# Patient Record
Sex: Female | Born: 1960 | Race: Black or African American | Hispanic: No | Marital: Married | Smoking: Former smoker
Health system: Southern US, Community
[De-identification: ages and names within clinical notes are randomized; demographics above are authoritative.]

## PROBLEM LIST (undated history)

## (undated) DIAGNOSIS — R748 Abnormal levels of other serum enzymes: Secondary | ICD-10-CM

## (undated) DIAGNOSIS — E785 Hyperlipidemia, unspecified: Secondary | ICD-10-CM

## (undated) DIAGNOSIS — I10 Essential (primary) hypertension: Secondary | ICD-10-CM

## (undated) DIAGNOSIS — J45909 Unspecified asthma, uncomplicated: Secondary | ICD-10-CM

## (undated) DIAGNOSIS — F32A Depression, unspecified: Secondary | ICD-10-CM

## (undated) DIAGNOSIS — F329 Major depressive disorder, single episode, unspecified: Secondary | ICD-10-CM

## (undated) DIAGNOSIS — F419 Anxiety disorder, unspecified: Secondary | ICD-10-CM

## (undated) DIAGNOSIS — R32 Unspecified urinary incontinence: Secondary | ICD-10-CM

## (undated) DIAGNOSIS — C801 Malignant (primary) neoplasm, unspecified: Secondary | ICD-10-CM

## (undated) DIAGNOSIS — E119 Type 2 diabetes mellitus without complications: Secondary | ICD-10-CM

## (undated) DIAGNOSIS — E669 Obesity, unspecified: Secondary | ICD-10-CM

## (undated) DIAGNOSIS — R51 Headache: Secondary | ICD-10-CM

## (undated) HISTORY — DX: Type 2 diabetes mellitus without complications: E11.9

## (undated) HISTORY — DX: Major depressive disorder, single episode, unspecified: F32.9

## (undated) HISTORY — DX: Depression, unspecified: F32.A

## (undated) HISTORY — DX: Headache: R51

## (undated) HISTORY — PX: ABDOMINAL HYSTERECTOMY: SHX81

## (undated) HISTORY — DX: Unspecified asthma, uncomplicated: J45.909

## (undated) HISTORY — PX: CRYOTHERAPY: SHX1416

## (undated) HISTORY — PX: PARTIAL HYSTERECTOMY: SHX80

## (undated) HISTORY — DX: Malignant (primary) neoplasm, unspecified: C80.1

## (undated) HISTORY — DX: Essential (primary) hypertension: I10

## (undated) HISTORY — DX: Anxiety disorder, unspecified: F41.9

## (undated) HISTORY — DX: Abnormal levels of other serum enzymes: R74.8

## (undated) HISTORY — DX: Hyperlipidemia, unspecified: E78.5

## (undated) HISTORY — PX: HEMORRHOID SURGERY: SHX153

## (undated) HISTORY — DX: Obesity, unspecified: E66.9

---
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
## 2001-06-15 ENCOUNTER — Other Ambulatory Visit: Admission: RE | Admit: 2001-06-15 | Discharge: 2001-06-15 | Payer: Self-pay | Admitting: Family Medicine

## 2001-08-13 ENCOUNTER — Encounter: Payer: Self-pay | Admitting: Internal Medicine

## 2001-08-13 ENCOUNTER — Emergency Department (HOSPITAL_COMMUNITY): Admission: EM | Admit: 2001-08-13 | Discharge: 2001-08-13 | Payer: Self-pay | Admitting: Internal Medicine

## 2001-08-21 ENCOUNTER — Encounter: Payer: Self-pay | Admitting: Family Medicine

## 2001-08-21 ENCOUNTER — Ambulatory Visit (HOSPITAL_COMMUNITY): Admission: RE | Admit: 2001-08-21 | Discharge: 2001-08-21 | Payer: Self-pay | Admitting: Family Medicine

## 2001-09-29 ENCOUNTER — Ambulatory Visit (HOSPITAL_COMMUNITY): Admission: RE | Admit: 2001-09-29 | Discharge: 2001-09-29 | Payer: Self-pay | Admitting: Orthopaedic Surgery

## 2001-09-29 ENCOUNTER — Encounter: Payer: Self-pay | Admitting: Orthopaedic Surgery

## 2002-03-15 ENCOUNTER — Emergency Department (HOSPITAL_COMMUNITY): Admission: EM | Admit: 2002-03-15 | Discharge: 2002-03-15 | Payer: Self-pay | Admitting: Emergency Medicine

## 2002-06-20 ENCOUNTER — Encounter: Payer: Self-pay | Admitting: Family Medicine

## 2002-06-20 ENCOUNTER — Ambulatory Visit (HOSPITAL_COMMUNITY): Admission: RE | Admit: 2002-06-20 | Discharge: 2002-06-20 | Payer: Self-pay | Admitting: Family Medicine

## 2004-06-21 ENCOUNTER — Emergency Department (HOSPITAL_COMMUNITY): Admission: EM | Admit: 2004-06-21 | Discharge: 2004-06-21 | Payer: Self-pay | Admitting: Emergency Medicine

## 2004-11-10 ENCOUNTER — Ambulatory Visit: Payer: Self-pay | Admitting: Family Medicine

## 2005-02-05 ENCOUNTER — Ambulatory Visit: Payer: Self-pay | Admitting: Family Medicine

## 2005-02-06 ENCOUNTER — Ambulatory Visit (HOSPITAL_COMMUNITY): Admission: RE | Admit: 2005-02-06 | Discharge: 2005-02-06 | Payer: Self-pay | Admitting: Family Medicine

## 2005-02-08 ENCOUNTER — Ambulatory Visit (HOSPITAL_COMMUNITY): Admission: RE | Admit: 2005-02-08 | Discharge: 2005-02-08 | Payer: Self-pay | Admitting: Family Medicine

## 2005-07-21 ENCOUNTER — Ambulatory Visit: Payer: Self-pay | Admitting: Family Medicine

## 2005-09-02 ENCOUNTER — Ambulatory Visit: Payer: Self-pay | Admitting: Family Medicine

## 2005-12-29 ENCOUNTER — Ambulatory Visit (HOSPITAL_COMMUNITY): Admission: RE | Admit: 2005-12-29 | Discharge: 2005-12-29 | Payer: Self-pay | Admitting: Family Medicine

## 2005-12-29 ENCOUNTER — Ambulatory Visit: Payer: Self-pay | Admitting: Family Medicine

## 2006-03-15 ENCOUNTER — Ambulatory Visit: Payer: Self-pay | Admitting: Family Medicine

## 2006-03-18 ENCOUNTER — Emergency Department (HOSPITAL_COMMUNITY): Admission: EM | Admit: 2006-03-18 | Discharge: 2006-03-18 | Payer: Self-pay | Admitting: Emergency Medicine

## 2006-03-21 ENCOUNTER — Ambulatory Visit: Payer: Self-pay | Admitting: Internal Medicine

## 2006-04-04 ENCOUNTER — Ambulatory Visit (HOSPITAL_COMMUNITY): Admission: RE | Admit: 2006-04-04 | Discharge: 2006-04-04 | Payer: Self-pay

## 2006-04-06 ENCOUNTER — Ambulatory Visit: Payer: Self-pay | Admitting: Family Medicine

## 2006-07-07 ENCOUNTER — Ambulatory Visit: Payer: Self-pay | Admitting: Family Medicine

## 2007-04-13 ENCOUNTER — Ambulatory Visit: Payer: Self-pay | Admitting: Family Medicine

## 2007-04-13 ENCOUNTER — Encounter: Payer: Self-pay | Admitting: Family Medicine

## 2007-04-13 LAB — CONVERTED CEMR LAB: Pap Smear: NORMAL

## 2007-04-14 ENCOUNTER — Encounter: Payer: Self-pay | Admitting: Family Medicine

## 2007-04-17 ENCOUNTER — Ambulatory Visit (HOSPITAL_COMMUNITY): Admission: RE | Admit: 2007-04-17 | Discharge: 2007-04-17 | Payer: Self-pay | Admitting: Family Medicine

## 2007-04-17 ENCOUNTER — Other Ambulatory Visit: Admission: RE | Admit: 2007-04-17 | Discharge: 2007-04-17 | Payer: Self-pay | Admitting: Family Medicine

## 2007-07-03 ENCOUNTER — Ambulatory Visit: Payer: Self-pay | Admitting: Family Medicine

## 2007-09-21 ENCOUNTER — Emergency Department (HOSPITAL_COMMUNITY): Admission: EM | Admit: 2007-09-21 | Discharge: 2007-09-21 | Payer: Self-pay | Admitting: Emergency Medicine

## 2007-09-25 ENCOUNTER — Ambulatory Visit: Payer: Self-pay | Admitting: Family Medicine

## 2007-11-30 ENCOUNTER — Encounter: Payer: Self-pay | Admitting: Family Medicine

## 2007-12-18 ENCOUNTER — Ambulatory Visit: Payer: Self-pay | Admitting: Family Medicine

## 2007-12-18 LAB — CONVERTED CEMR LAB
BUN: 7 mg/dL
Basophils Absolute: 0 K/uL
Basophils Relative: 0 %
CO2: 25 meq/L
Calcium: 9.4 mg/dL
Chloride: 101 meq/L
Cholesterol: 210 mg/dL — ABNORMAL HIGH
Creatinine, Ser: 0.82 mg/dL
Eosinophils Absolute: 0.1 K/uL
Eosinophils Relative: 2 %
Glucose, Bld: 106 mg/dL — ABNORMAL HIGH
HCT: 44.2 %
HDL: 61 mg/dL
Hemoglobin: 14.5 g/dL
LDL Cholesterol: 128 mg/dL — ABNORMAL HIGH
Lymphocytes Relative: 41 %
Lymphs Abs: 1.9 K/uL
MCHC: 32.8 g/dL
MCV: 85.2 fL
Monocytes Absolute: 0.5 K/uL
Monocytes Relative: 11 %
Neutro Abs: 2.1 K/uL
Neutrophils Relative %: 45 %
Platelets: 286 K/uL
Potassium: 3.8 meq/L
RBC: 5.19 M/uL — ABNORMAL HIGH
RDW: 13.6 %
Sodium: 141 meq/L
Total CHOL/HDL Ratio: 3.4
Triglycerides: 105 mg/dL
VLDL: 21 mg/dL
WBC: 4.7 10*3/microliter

## 2007-12-20 ENCOUNTER — Ambulatory Visit (HOSPITAL_COMMUNITY): Admission: RE | Admit: 2007-12-20 | Discharge: 2007-12-20 | Payer: Self-pay | Admitting: Family Medicine

## 2008-04-16 ENCOUNTER — Encounter: Payer: Self-pay | Admitting: Family Medicine

## 2008-04-16 DIAGNOSIS — F321 Major depressive disorder, single episode, moderate: Secondary | ICD-10-CM

## 2008-04-16 DIAGNOSIS — F4323 Adjustment disorder with mixed anxiety and depressed mood: Secondary | ICD-10-CM

## 2008-04-30 ENCOUNTER — Ambulatory Visit: Payer: Self-pay | Admitting: Family Medicine

## 2008-04-30 ENCOUNTER — Encounter: Payer: Self-pay | Admitting: Family Medicine

## 2008-09-23 ENCOUNTER — Encounter: Payer: Self-pay | Admitting: Family Medicine

## 2008-09-23 ENCOUNTER — Other Ambulatory Visit: Admission: RE | Admit: 2008-09-23 | Discharge: 2008-09-23 | Payer: Self-pay | Admitting: Family Medicine

## 2008-09-23 ENCOUNTER — Ambulatory Visit: Payer: Self-pay | Admitting: Family Medicine

## 2008-09-23 LAB — CONVERTED CEMR LAB: OCCULT 1: NEGATIVE

## 2008-09-25 ENCOUNTER — Ambulatory Visit (HOSPITAL_COMMUNITY): Admission: RE | Admit: 2008-09-25 | Discharge: 2008-09-25 | Payer: Self-pay | Admitting: Family Medicine

## 2008-10-02 ENCOUNTER — Encounter: Payer: Self-pay | Admitting: Family Medicine

## 2008-10-03 ENCOUNTER — Ambulatory Visit: Payer: Self-pay | Admitting: Family Medicine

## 2008-10-06 DIAGNOSIS — J45909 Unspecified asthma, uncomplicated: Secondary | ICD-10-CM

## 2008-11-04 ENCOUNTER — Ambulatory Visit: Payer: Self-pay | Admitting: Family Medicine

## 2008-11-29 HISTORY — PX: COLONOSCOPY WITH ESOPHAGOGASTRODUODENOSCOPY (EGD): SHX5779

## 2008-12-03 ENCOUNTER — Ambulatory Visit: Payer: Self-pay | Admitting: Family Medicine

## 2008-12-03 DIAGNOSIS — K644 Residual hemorrhoidal skin tags: Secondary | ICD-10-CM | POA: Insufficient documentation

## 2008-12-08 DIAGNOSIS — I10 Essential (primary) hypertension: Secondary | ICD-10-CM | POA: Insufficient documentation

## 2008-12-17 ENCOUNTER — Ambulatory Visit (HOSPITAL_COMMUNITY): Admission: RE | Admit: 2008-12-17 | Discharge: 2008-12-17 | Payer: Self-pay | Admitting: General Surgery

## 2008-12-17 ENCOUNTER — Encounter: Payer: Self-pay | Admitting: Family Medicine

## 2008-12-17 LAB — HM COLONOSCOPY: HM Colonoscopy: NORMAL

## 2008-12-18 ENCOUNTER — Encounter: Payer: Self-pay | Admitting: Family Medicine

## 2009-01-21 ENCOUNTER — Ambulatory Visit: Payer: Self-pay | Admitting: Family Medicine

## 2009-01-21 DIAGNOSIS — R1013 Epigastric pain: Secondary | ICD-10-CM

## 2009-01-21 DIAGNOSIS — K3189 Other diseases of stomach and duodenum: Secondary | ICD-10-CM

## 2009-01-27 ENCOUNTER — Ambulatory Visit (HOSPITAL_COMMUNITY): Admission: RE | Admit: 2009-01-27 | Discharge: 2009-01-27 | Payer: Self-pay | Admitting: Family Medicine

## 2009-01-30 ENCOUNTER — Encounter (HOSPITAL_COMMUNITY): Admission: RE | Admit: 2009-01-30 | Discharge: 2009-03-01 | Payer: Self-pay | Admitting: Family Medicine

## 2009-02-06 ENCOUNTER — Telehealth: Payer: Self-pay | Admitting: Family Medicine

## 2009-02-10 ENCOUNTER — Telehealth: Payer: Self-pay | Admitting: Family Medicine

## 2009-03-26 ENCOUNTER — Ambulatory Visit: Payer: Self-pay | Admitting: Family Medicine

## 2009-03-27 LAB — CONVERTED CEMR LAB
CO2: 24 meq/L (ref 19–32)
Calcium: 9.6 mg/dL (ref 8.4–10.5)
Creatinine, Ser: 1.04 mg/dL (ref 0.40–1.20)
Eosinophils Absolute: 0.1 10*3/uL (ref 0.0–0.7)
Eosinophils Relative: 1 % (ref 0–5)
HCT: 38.2 % (ref 36.0–46.0)
HDL: 46 mg/dL (ref >39)
Hemoglobin: 12.9 g/dL (ref 12.0–15.0)
Lymphocytes Relative: 21 % (ref 12–46)
Lymphs Abs: 2.4 10*3/uL (ref 0.7–4.0)
MCV: 81.4 fL (ref 78.0–100.0)
Monocytes Relative: 9 % (ref 3–12)
Neutro Abs: 7.8 10*3/uL — ABNORMAL HIGH (ref 1.7–7.7)
Neutrophils Relative %: 68 % (ref 43–77)
Potassium: 3.9 meq/L (ref 3.5–5.3)
Triglycerides: 113 mg/dL (ref <150)

## 2009-03-30 DIAGNOSIS — R519 Headache, unspecified: Secondary | ICD-10-CM | POA: Insufficient documentation

## 2009-03-30 DIAGNOSIS — R51 Headache: Secondary | ICD-10-CM | POA: Insufficient documentation

## 2009-04-08 ENCOUNTER — Encounter: Payer: Self-pay | Admitting: Family Medicine

## 2009-07-31 ENCOUNTER — Ambulatory Visit: Payer: Self-pay | Admitting: Family Medicine

## 2009-07-31 ENCOUNTER — Other Ambulatory Visit: Admission: RE | Admit: 2009-07-31 | Discharge: 2009-07-31 | Payer: Self-pay | Admitting: Family Medicine

## 2009-07-31 ENCOUNTER — Encounter: Payer: Self-pay | Admitting: Family Medicine

## 2009-08-26 ENCOUNTER — Ambulatory Visit (HOSPITAL_COMMUNITY): Payer: Self-pay | Admitting: Psychiatry

## 2009-09-03 ENCOUNTER — Ambulatory Visit (HOSPITAL_COMMUNITY): Payer: Self-pay | Admitting: Psychiatry

## 2009-09-15 ENCOUNTER — Telehealth: Payer: Self-pay | Admitting: Family Medicine

## 2009-09-17 ENCOUNTER — Ambulatory Visit: Payer: Self-pay | Admitting: Family Medicine

## 2009-10-01 ENCOUNTER — Emergency Department (HOSPITAL_COMMUNITY): Admission: EM | Admit: 2009-10-01 | Discharge: 2009-10-01 | Payer: Self-pay | Admitting: Emergency Medicine

## 2009-11-04 ENCOUNTER — Ambulatory Visit: Payer: Self-pay | Admitting: Family Medicine

## 2009-11-04 LAB — CONVERTED CEMR LAB
LDL Cholesterol: 146 mg/dL — ABNORMAL HIGH (ref 0–99)
VLDL: 26 mg/dL (ref 0–40)

## 2009-11-15 DIAGNOSIS — E669 Obesity, unspecified: Secondary | ICD-10-CM

## 2009-12-11 ENCOUNTER — Ambulatory Visit (HOSPITAL_COMMUNITY): Payer: Self-pay | Admitting: Psychiatry

## 2009-12-16 ENCOUNTER — Telehealth: Payer: Self-pay | Admitting: Family Medicine

## 2009-12-19 ENCOUNTER — Encounter: Payer: Self-pay | Admitting: Family Medicine

## 2010-01-08 ENCOUNTER — Ambulatory Visit: Payer: Self-pay | Admitting: Family Medicine

## 2010-01-14 ENCOUNTER — Encounter: Payer: Self-pay | Admitting: Physician Assistant

## 2010-01-14 ENCOUNTER — Ambulatory Visit: Payer: Self-pay | Admitting: Family Medicine

## 2010-01-14 DIAGNOSIS — J111 Influenza due to unidentified influenza virus with other respiratory manifestations: Secondary | ICD-10-CM

## 2010-01-20 ENCOUNTER — Ambulatory Visit (HOSPITAL_COMMUNITY): Payer: Self-pay | Admitting: Psychiatry

## 2010-01-22 ENCOUNTER — Ambulatory Visit (HOSPITAL_COMMUNITY): Admission: RE | Admit: 2010-01-22 | Discharge: 2010-01-22 | Payer: Self-pay | Admitting: Family Medicine

## 2010-02-27 ENCOUNTER — Ambulatory Visit: Payer: Self-pay | Admitting: Family Medicine

## 2010-02-27 DIAGNOSIS — R5383 Other fatigue: Secondary | ICD-10-CM

## 2010-02-27 DIAGNOSIS — R5381 Other malaise: Secondary | ICD-10-CM | POA: Insufficient documentation

## 2010-03-03 ENCOUNTER — Ambulatory Visit (HOSPITAL_COMMUNITY): Payer: Self-pay | Admitting: Psychiatry

## 2010-03-31 ENCOUNTER — Ambulatory Visit (HOSPITAL_COMMUNITY): Payer: Self-pay | Admitting: Psychiatry

## 2010-04-16 ENCOUNTER — Telehealth: Payer: Self-pay | Admitting: Family Medicine

## 2010-04-17 ENCOUNTER — Ambulatory Visit: Payer: Self-pay | Admitting: Family Medicine

## 2010-05-04 ENCOUNTER — Encounter: Payer: Self-pay | Admitting: Family Medicine

## 2010-05-04 ENCOUNTER — Telehealth: Payer: Self-pay | Admitting: Family Medicine

## 2010-06-16 ENCOUNTER — Ambulatory Visit: Payer: Self-pay | Admitting: Family Medicine

## 2010-09-22 ENCOUNTER — Telehealth: Payer: Self-pay | Admitting: Family Medicine

## 2010-09-22 ENCOUNTER — Ambulatory Visit: Payer: Self-pay | Admitting: Family Medicine

## 2010-09-22 DIAGNOSIS — H547 Unspecified visual loss: Secondary | ICD-10-CM | POA: Insufficient documentation

## 2010-09-28 ENCOUNTER — Encounter: Payer: Self-pay | Admitting: Family Medicine

## 2010-09-29 ENCOUNTER — Encounter: Payer: Self-pay | Admitting: Family Medicine

## 2010-09-29 DIAGNOSIS — E782 Mixed hyperlipidemia: Secondary | ICD-10-CM | POA: Insufficient documentation

## 2010-09-29 DIAGNOSIS — E781 Pure hyperglyceridemia: Secondary | ICD-10-CM | POA: Insufficient documentation

## 2010-09-29 DIAGNOSIS — E785 Hyperlipidemia, unspecified: Secondary | ICD-10-CM | POA: Insufficient documentation

## 2010-09-29 DIAGNOSIS — E559 Vitamin D deficiency, unspecified: Secondary | ICD-10-CM

## 2010-09-29 LAB — CONVERTED CEMR LAB
AST: 13 units/L (ref 0–37)
Albumin: 4.4 g/dL (ref 3.5–5.2)
Alkaline Phosphatase: 53 units/L (ref 39–117)
Basophils Absolute: 0 10*3/uL (ref 0.0–0.1)
Basophils Relative: 1 % (ref 0–1)
CO2: 29 meq/L (ref 19–32)
Calcium: 9.5 mg/dL (ref 8.4–10.5)
Cholesterol: 261 mg/dL — ABNORMAL HIGH (ref 0–200)
Creatinine, Ser: 0.87 mg/dL (ref 0.40–1.20)
Glucose, Bld: 93 mg/dL (ref 70–99)
HCT: 39.2 % (ref 36.0–46.0)
Hemoglobin: 12.8 g/dL (ref 12.0–15.0)
MCV: 84.8 fL (ref 78.0–100.0)
Neutro Abs: 3.5 10*3/uL (ref 1.7–7.7)
Potassium: 4.3 meq/L (ref 3.5–5.3)
RBC: 4.62 M/uL (ref 3.87–5.11)
RDW: 14 % (ref 11.5–15.5)
TSH: 1.159 microintl units/mL (ref 0.350–4.500)
Total Protein: 7.6 g/dL (ref 6.0–8.3)
Triglycerides: 156 mg/dL — ABNORMAL HIGH (ref <150)
WBC: 8.9 10*3/uL (ref 4.0–10.5)

## 2010-10-07 ENCOUNTER — Ambulatory Visit: Payer: Self-pay | Admitting: Family Medicine

## 2010-10-13 ENCOUNTER — Encounter: Payer: Self-pay | Admitting: Family Medicine

## 2010-11-17 ENCOUNTER — Other Ambulatory Visit
Admission: RE | Admit: 2010-11-17 | Discharge: 2010-11-17 | Payer: Self-pay | Source: Home / Self Care | Admitting: Family Medicine

## 2010-11-18 ENCOUNTER — Ambulatory Visit: Payer: Self-pay | Admitting: Family Medicine

## 2010-11-19 ENCOUNTER — Encounter: Payer: Self-pay | Admitting: Family Medicine

## 2010-12-20 ENCOUNTER — Encounter: Payer: Self-pay | Admitting: Internal Medicine

## 2010-12-20 ENCOUNTER — Encounter: Payer: Self-pay | Admitting: Family Medicine
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
## 2010-12-29 NOTE — Letter (Signed)
Summary: FMLA PAPER  FMLA PAPER   Imported By: Dierdre Harness 04/17/2010 10:16:31  _____________________________________________________________________  External Attachment:    Type:   Image     Comment:   External Document

## 2010-12-29 NOTE — Letter (Signed)
Summary: fmla papers  fmla papers   Imported By: Dierdre Harness 09/29/2010 14:55:58  _____________________________________________________________________  External Attachment:    Type:   Image     Comment:   External Document

## 2010-12-29 NOTE — Letter (Signed)
Summary: demo  demo   Imported By: Dierdre Harness 04/17/2010 14:46:23  _____________________________________________________________________  External Attachment:    Type:   Image     Comment:   External Document

## 2010-12-29 NOTE — Letter (Signed)
Summary: phone notes  phone notes   Imported By: Dierdre Harness 04/17/2010 14:48:48  _____________________________________________________________________  External Attachment:    Type:   Image     Comment:   External Document

## 2010-12-29 NOTE — Letter (Signed)
Summary: EYE EXAM  EYE EXAM   Imported By: Dierdre Harness 10/13/2010 09:37:51  _____________________________________________________________________  External Attachment:    Type:   Image     Comment:   External Document

## 2010-12-29 NOTE — Letter (Signed)
Summary: HANDICAPP CARD  HANDICAPP CARD   Imported By: Dierdre Harness 05/04/2010 15:59:41  _____________________________________________________________________  External Attachment:    Type:   Image     Comment:   External Document

## 2010-12-29 NOTE — Letter (Signed)
Summary: office notes  office notes   Imported By: Dierdre Harness 04/17/2010 14:45:20  _____________________________________________________________________  External Attachment:    Type:   Image     Comment:   External Document

## 2010-12-29 NOTE — Assessment & Plan Note (Signed)
Summary: F UP   Vital Signs:  Patient profile:   50 year old female Menstrual status:  hysterectomy Height:      62.5 inches Weight:      173.75 pounds BMI:     31.39 O2 Sat:      97 % on Room air Pulse rate:   106 / minute Pulse rhythm:   regular Resp:     16 per minute BP sitting:   122 / 90  (left arm)  Vitals Entered By: Louie Casa CMA (September 22, 2010 8:34 AM)  Nutrition Counseling: Patient's BMI is greater than 25 and therefore counseled on weight management options.  O2 Flow:  Room air CC: Follow up   Primary Care Provider:  Tula Nakayama MD  CC:  Follow up.  History of Present Illness: 2 year h/o intermittent loss of vision sometimes 5 mins and other times up to 45 mins, both eyes have been affected, most often the right eye only.most recentepisode was 1 week ago affecting both eyes lasting 45 mins. Last eye exam was over 2 yrs ago She has had intermittentweakness in both legs for the past year, she has been dragging her legs at times, and last week had to be lifted by herhusband. She denies recent fever or chills. She has chronic anxiety which is fairly well controlled at this time  Preventive Screening-Counseling & Management  Alcohol-Tobacco     Smoking Cessation Counseling: yes  Current Medications (verified): 1)  Alprazolam 1 Mg  Tbdp (Alprazolam) .... Take 1 Tablet By Mouth Two Times A Day 2)  Duoneb 0.5-2.5 (3) Mg/15m  Soln (Ipratropium-Albuterol) .... As Needed 3)  Symbicort 80-4.5 Mcg/act  Aero (Budesonide-Formoterol Fumarate) .... Inhale One Puff Daily 4)  Amlodipine Besylate 2.5 Mg Tabs (Amlodipine Besylate) .... One Tab By Mouth Once Daily. 5)  Proair Hfa 108 (90 Base) Mcg/act Aers (Albuterol Sulfate) .... 2 Puffs Every 6 To 8 Hrs As Needed  Allergies (verified): No Known Drug Allergies  Review of Systems General:  Complains of fatigue. Eyes:  Complains of vision loss-both eyes. ENT:  Denies earache, hoarseness, nasal congestion,  postnasal drainage, and sinus pressure. CV:  Denies chest pain or discomfort, palpitations, and swelling of feet. Resp:  Denies cough and sputum productive. GI:  Denies abdominal pain, constipation, diarrhea, nausea, and vomiting. GU:  Denies dysuria and urinary frequency. MS:  Complains of muscle weakness; denies joint pain, low back pain, mid back pain, and stiffness. Psych:  Complains of anxiety and mental problems; denies depression, suicidal thoughts/plans, thoughts of violence, and unusual visions or sounds. Endo:  Denies cold intolerance, excessive thirst, excessive urination, and heat intolerance. Heme:  Denies abnormal bruising and bleeding. Allergy:  Denies hives or rash and itching eyes.  Physical Exam  General:  Well-developed,well-nourished,in no acute distress; alert,appropriate and cooperative throughout examination HEENT: No facial asymmetry,  EOMI, No sinus tenderness, TM's Clear, oropharynx  pink and moist.   Chest: Clear to auscultation bilaterally.  CVS: S1, S2, No murmurs, No S3.   Abd: Soft, Nontender.  MS: Adequate ROM spine, hips, shoulders and knees.  Ext: No edema.   CNS: CN 2-12 intact, power tone and sensation normal throughout.   Skin: Intact, no visible lesions or rashes.  Psych: Good eye contact, normal affect.  Memory intact, not anxious or depressed appearing.    Impression & Recommendations:  Problem # 1:  WEAKNESS (ICD-780.79) Assessment Deteriorated  Orders: T-TSH (([id number] Neurology Referral (Neuro)  Problem # 2:  UNSPECIFIED VISUAL LOSS (  ICD-369.9) Assessment: Deteriorated  Orders: Ophthalmology Referral (Ophthalmology) Neurology Referral (Neuro)  Problem # 3:  OBESITY, UNSPECIFIED (ICD-278.00) Assessment: Improved  Ht: 62.5 (09/22/2010)   Wt: 173.75 (09/22/2010)   BMI: 31.39 (09/22/2010) therapeutic lifestyle change discussed and encouraged  Problem # 4:  HYPERTENSION (ICD-401.9) Assessment: Deteriorated  Her updated  medication list for this problem includes:    Amlodipine Besylate 2.5 Mg Tabs (Amlodipine besylate) ..... One tab by mouth once daily. Patient advised to follow low sodium diet rich in fruit and vegetables, and to commit to at least 30 minutes 5 days per week of regular exercise , to improve blood presure control.   BP today: 122/90 Prior BP: 120/84 (06/16/2010)  Labs Reviewed: K+: 3.9 (03/26/2009) Creat: : 1.04 (03/26/2009)   Chol: 229 (11/04/2009)   HDL: 57 (11/04/2009)   LDL: 146 (11/04/2009)   TG: 130 (11/04/2009)  Problem # 5:  VITAMIN D DEFICIENCY (ICD-268.9) Assessment: Comment Only p[t to start once weekly vitr D and rept level in 4 months , she should also start daily oTC ca with D  Problem # 6:  ANXIETY (ICD-300.00)  Her updated medication list for this problem includes:    Alprazolam 1 Mg Tbdp (Alprazolam) .Marland Kitchen... Take 1 tablet by mouth two times a day  Problem # 7:  HYPERLIPIDEMIA (ICD-272.4) Assessment: Comment Only  Her updated medication list for this problem includes:    Lipitor 20 Mg Tabs (Atorvastatin calcium) .Marland Kitchen... Take 1 tab by mouth at bedtime    HDL:57 (11/04/2009), 46 (03/26/2009)  LDL:146 (11/04/2009), 167 (03/26/2009)  Chol:229 (11/04/2009), 236 (03/26/2009)  Trig:130 (11/04/2009), 113 (03/26/2009) Low fat dietdiscussed and encouraged  Complete Medication List: 1)  Alprazolam 1 Mg Tbdp (Alprazolam) .... Take 1 tablet by mouth two times a day 2)  Duoneb 0.5-2.5 (3) Mg/37m Soln (Ipratropium-albuterol) .... As needed 3)  Symbicort 80-4.5 Mcg/act Aero (Budesonide-formoterol fumarate) .... Inhale one puff daily 4)  Amlodipine Besylate 2.5 Mg Tabs (Amlodipine besylate) .... One tab by mouth once daily. 5)  Proair Hfa 108 (90 Base) Mcg/act Aers (Albuterol sulfate) .... 2 puffs every 6 to 8 hrs as needed 6)  Vitamin D (ergocalciferol) 50000 Unit Caps (Ergocalciferol) .... One capsule once weekly 7)  Lipitor 20 Mg Tabs (Atorvastatin calcium) .... Take 1 tab by mouth  at bedtime  Other Orders: T-Basic Metabolic Panel ([id number] T-CBC w/Diff ([phone] T-Lipid Profile ([phone] T-Hepatic Function ([phone] Influenza Vaccine NON MCR ([phone] T-Vitamin D (25-Hydroxy) ([phone]  Patient Instructions: 1)  CPE in 3 weeks 2)  Tobacco is very bad for your health and your loved ones! You Should stop smoking!. 3)  Stop Smoking Tips: Choose a Quit date. Cut down before the Quit date. decide what you will do as a substitute when you feel the urge to smoke(gum,toothpick,exercise). 4)  It is important that you exercise regularly at least 20 minutes 5 times a week. If you develop chest pain, have severe difficulty breathing, or feel very tired , stop exercising immediately and seek medical attention. 5)  You need to lose weight. Consider a lower calorie diet and regular exercise.  6)  I am VERY concerned that you may have a neurologic disease, It isvital that you see the neurologist and also the opthalmologist, we will schedule appts. 7)  Fasting labs past due , pls get asap. 8)  Flu vaccine today Prescriptions: LIPITOR 20 MG TABS (ATORVASTATIN CALCIUM) Take 1 tab by mouth at bedtime  #30 x 3   Entered and Authorized by:   MTula Nakayama  MD   Signed by:   Tula Nakayama MD on 09/29/2010   Method used:   Historical   RxID:   [id number] VITAMIN D (ERGOCALCIFEROL) 50000 UNIT CAPS (ERGOCALCIFEROL) one capsule once weekly  #4 x 4   Entered and Authorized by:   Tula Nakayama MD   Signed by:   Tula Nakayama MD on 09/29/2010   Method used:   Historical   RxID:   [id number]    Orders Added: 1)  Est. Patient Level IV [71245] 2)  T-Basic Metabolic Panel [[id number]] 3)  T-CBC w/Diff [[id number]] 4)  T-Lipid Profile [80061-22930] 5)  T-Hepatic Function [93790-24097] 6)  T-TSH [35329-92426] 7)  Influenza Vaccine NON MCR [83419] 8)  T-Vitamin D (25-Hydroxy) [62229-79892] 9)  Ophthalmology Referral [Ophthalmology] 10)   Neurology Referral [Neuro]   Immunizations Administered:  Influenza Vaccine:    Vaccine Type: Fluvax Non-MCR    Site: right deltoid    Mfr: novartis    Dose: 0.5 ml    Route: IM    Given by: Louie Casa CMA    Exp. Date: 03/2011    Lot #: [id number]    VIS given: 06/23/10 version given September 22, 2010.   Immunizations Administered:  Influenza Vaccine:    Vaccine Type: Fluvax Non-MCR    Site: right deltoid    Mfr: novartis    Dose: 0.5 ml    Route: IM    Given by: Louie Casa CMA    Exp. Date: 03/2011    Lot #: [id number]    VIS given: 06/23/10 version given September 22, 2010.

## 2010-12-29 NOTE — Letter (Signed)
Summary: consults  consults   Imported By: Dierdre Harness 04/17/2010 14:45:51  _____________________________________________________________________  External Attachment:    Type:   Image     Comment:   External Document

## 2010-12-29 NOTE — Assessment & Plan Note (Signed)
Summary: F UP   Vital Signs:  Patient profile:   50 year old female Menstrual status:  hysterectomy Height:      62.5 inches Weight:      181.75 pounds BMI:     32.83 O2 Sat:      98 % Pulse rate:   91 / minute Resp:     16 per minute BP sitting:   120 / 84  (left arm) Cuff size:   large  Vitals Entered By: Kate Sable LPN (June 16, 4192 7:90 AM) CC: Follow up chronic problems   Primary Care Provider:  Tula Nakayama MD  CC:  Follow up chronic problems.  History of Present Illness: pt has had several close losses this year, an aunt ,an uncle , and recent she had exrtraction of 4 teeth also, so she does report inc in her stress and anxietyu, however she is dealing wilth this fairly well. she has stopped the antipsychotic meds as well as seeing the psychiatrist , and so far she is doing well.  Denies recent fever or chills. Denies sinus pressure, nasal congestion , ear pain or sore throat. Denies chest congestion, or cough productive of sputum. Denies chest pain, palpitations, PND, orthopnea or leg swelling. Denies abdominal pain, nausea, vomitting, diarrhea or constipation. Denies change in bowel movements or bloody stool. Denies dysuria , frequency, incontinence or hesitancy. Denies  joint pain, swelling, or reduced mobility. Denies headaches, vertigo, seizures.  Denies  rash, lesions, or itch.     Current Medications (verified): 1)  Alprazolam 1 Mg  Tbdp (Alprazolam) .... Take 1 Tablet By Mouth Two Times A Day 2)  Duoneb 0.5-2.5 (3) Mg/22m  Soln (Ipratropium-Albuterol) .... As Needed 3)  Symbicort 80-4.5 Mcg/act  Aero (Budesonide-Formoterol Fumarate) .... Inhale One Puff Daily 4)  Amlodipine Besylate 2.5 Mg Tabs (Amlodipine Besylate) .... One Tab By Mouth Qd 5)  Phentermine Hcl 37.5 Mg Tabs (Phentermine Hcl) .... Take 1 Tablet By Mouth Once A Day 6)  Proair Hfa 108 (90 Base) Mcg/act Aers (Albuterol Sulfate) .... 2 Puffs Every 6 To 8 Hrs As Needed  Allergies  (verified): No Known Drug Allergies  Review of Systems      See HPI General:  Complains of fatigue and sleep disorder; denies chills and fever. Eyes:  Denies blurring, discharge, eye pain, and red eye. Psych:  Complains of anxiety, depression, and mental problems; denies suicidal thoughts/plans, thoughts of violence, and unusual visions or sounds. Endo:  Denies cold intolerance, excessive hunger, excessive thirst, excessive urination, heat intolerance, polyuria, and weight change. Heme:  Denies abnormal bruising and bleeding. Allergy:  Complains of seasonal allergies; denies hives or rash and itching eyes.  Physical Exam  General:  Well-developed,well-nourished,in no acute distress; alert,appropriate and cooperative throughout examination HEENT: No facial asymmetry,  EOMI, No sinus tenderness, TM's Clear, oropharynx  pink and moist.   Chest: Clear to auscultation bilaterally.  CVS: S1, S2, No murmurs, No S3.   Abd: Soft, Nontender.  MS: Adequate ROM spine, hips, shoulders and knees.  Ext: No edema.   CNS: CN 2-12 intact, power tone and sensation normal throughout.   Skin: Intact, no visible lesions or rashes.  Psych: Good eye contact, normal affect.  Memory intact, not anxious or depressed appearing.    Impression & Recommendations:  Problem # 1:  OBESITY, UNSPECIFIED (ICD-278.00) Assessment Improved  Ht: 62.5 (06/16/2010)   Wt: 181.75 (06/16/2010)   BMI: 32.83 (06/16/2010)  Problem # 2:  HYPERTENSION (ICD-401.9) Assessment: Unchanged  Her updated  medication list for this problem includes:    Amlodipine Besylate 2.5 Mg Tabs (Amlodipine besylate) ..... One tab by mouth qd  BP today: 120/84 Prior BP: 120/80 (02/27/2010)  Labs Reviewed: K+: 3.9 (03/26/2009) Creat: : 1.04 (03/26/2009)   Chol: 229 (11/04/2009)   HDL: 57 (11/04/2009)   LDL: 146 (11/04/2009)   TG: 130 (11/04/2009)  Problem # 3:  ASTHMA, UNSPECIFIED, UNSPECIFIED STATUS (ICD-493.90) Assessment: Improved  Her  updated medication list for this problem includes:    Duoneb 0.5-2.5 (3) Mg/31m Soln (Ipratropium-albuterol) ..Marland Kitchen.. As needed    Symbicort 80-4.5 Mcg/act Aero (Budesonide-formoterol fumarate) ..... Inhale one puff daily    Proair Hfa 108 (90 Base) Mcg/act Aers (Albuterol sulfate) ..Marland Kitchen.. 2 puffs every 6 to 8 hrs as needed  Problem # 4:  DEPRESSION (ICD-311) Assessment: Improved  Her updated medication list for this problem includes:    Alprazolam 1 Mg Tbdp (Alprazolam) ..Marland Kitchen.. Take 1 tablet by mouth two times a day  Discussed treatment options, including trial of antidpressant medication. Will refer to behavioral health. Follow-up call in in 24-48 hours and recheck in 2 weeks, sooner as needed. Patient agrees to call if any worsening of symptoms or thoughts of doing harm arise. Verified that the patient has no suicidal ideation at this time.  Pt has decided to rely on her faith as far as her mentral illness is concerned. Currently she is stable. Shee has also discontinued psychh appts as well as psych meds  Problem # 5:  EXTERNAL HEMORRHOIDS (ICD-455.3) Assessment: Improved  Problem # 6:  ANXIETY (ICD-300.00) Assessment: Improved  Her updated medication list for this problem includes:    Alprazolam 1 Mg Tbdp (Alprazolam) ..Marland Kitchen.. Take 1 tablet by mouth two times a day  Complete Medication List: 1)  Alprazolam 1 Mg Tbdp (Alprazolam) .... Take 1 tablet by mouth two times a day 2)  Duoneb 0.5-2.5 (3) Mg/367mSoln (Ipratropium-albuterol) .... As needed 3)  Symbicort 80-4.5 Mcg/act Aero (Budesonide-formoterol fumarate) .... Inhale one puff daily 4)  Amlodipine Besylate 2.5 Mg Tabs (Amlodipine besylate) .... One tab by mouth qd 5)  Phentermine Hcl 37.5 Mg Tabs (Phentermine hcl) .... Take 1 tablet by mouth once a day 6)  Proair Hfa 108 (90 Base) Mcg/act Aers (Albuterol sulfate) .... 2 puffs every 6 to 8 hrs as needed 7)  Ibuprofen 800 Mg Tabs (Ibuprofen) .... Take 1 tablet by mouth three times a day as  needed  Patient Instructions: 1)  Please schedule a cPE in 3 months. 2)  It is important that you exercise regularly at least 20 minutes 5 times a week. If you develop chest pain, have severe difficulty breathing, or feel very tired , stop exercising immediately and seek medical attention. 3)  You need to lose weight. Consider a lower calorie diet and regular exercise.  4)  labs past due pls do asap Prescriptions: ALPRAZOLAM 1 MG  TBDP (ALPRAZOLAM) Take 1 tablet by mouth two times a day  #60 x 3   Entered by:   BrKate SablePN   Authorized by:   MaTula NakayamaD   Signed by:   BrKate SablePN on 0700/93/8182 Method used:   Printed then faxed to ...       [facility]       [address]     [place]  [address]     Ph: [phone]  Fax: [phone]   RxID:   [id number] AMLODIPINE BESYLATE 2.5 MG TABS (AMLODIPINE BESYLATE) ONE TAB by mouth QD  #30 x 5   Entered by:   Kate Sable LPN   Authorized by:   Tula Nakayama MD   Signed by:   Kate Sable LPN on 75/83/0746   Method used:   Printed then faxed to ...       [facility] (retail)       [address]       [place], [place]  [address]       Ph: [phone]       Fax: [phone]   RxID:   [id number] PHENTERMINE HCL 37.5 MG TABS (PHENTERMINE HCL) Take 1 tablet by mouth once a day  #30 x 2   Entered by:   Kate Sable LPN   Authorized by:   Tula Nakayama MD   Signed by:   Kate Sable LPN on 07/35/4301   Method used:   Printed then faxed to ...       [facility]  [address] ([facility]ail[facility]       [address]       [place], [place]  [address]       Ph: [phone]       Fax: [phone]   RxID:   [id number] IBUPROFEN 800 MG TABS (IBUPROFEN) Take 1 tablet by mouth three times a day as needed  #30 x 0   Entered and Authorized by:   Tula Nakayama MD   Signed by:   Tula Nakayama MD on 06/16/2010    Method used:   Electronically to        [facility] (retail)       [address]       [place], [place]  [address]       Ph: [phone]       Fax: [phone]   RxID:   [id number]

## 2010-12-29 NOTE — Progress Notes (Signed)
  Phone Note Other Incoming   Caller: dr simpson Summary of Call: pls fill in fmla form from mopst recent, and i will review and sign Initial call taken by: Tula Nakayama MD,  December 16, 2009 12:46 PM  Follow-up for Phone Call        completed as much of form as i could and gave to doc to review Follow-up by: Jimmey Ralph LPN,  December 18, 9978 9:37 AM

## 2010-12-29 NOTE — Letter (Signed)
Summary: x rays  x rays   Imported By: Dierdre Harness 04/17/2010 14:49:13  _____________________________________________________________________  External Attachment:    Type:   Image     Comment:   External Document

## 2010-12-29 NOTE — Letter (Signed)
Summary: misc  misc   Imported By: Dierdre Harness 04/17/2010 14:48:11  _____________________________________________________________________  External Attachment:    Type:   Image     Comment:   External Document

## 2010-12-29 NOTE — Letter (Signed)
Summary: fmla papers  fmla papers   Imported By: Dierdre Harness 12/19/2009 09:34:50  _____________________________________________________________________  External Attachment:    Type:   Image     Comment:   External Document

## 2010-12-29 NOTE — Progress Notes (Signed)
Summary: MEDS  Phone Note Call from Patient   Summary of Call: NEEDS ALL MEDS SENT TO [facility] CALL AND LET HER KNOW WHEN CALLED IN AND DONE @ [phone] Initial call taken by: Dierdre Harness,  September 22, 2010 9:48 AM    Prescriptions: ALPRAZOLAM 1 MG  TBDP (ALPRAZOLAM) Take 1 tablet by mouth two times a day  #60 x 3   Entered by:   Baldomero Lamy LPN   Authorized by:   Tula Nakayama MD   Signed by:   Baldomero Lamy LPN on 19/50/9326   Method used:   Printed then faxed to ...       [facility]  [address] (retail)       [address]       [place], [place]  [address]       Ph: [phone]       Fax: [phone]   RxID:   [id number] [place] 0.5-2.5 (3) MG/3ML  SOLN (IPRATROPIUM-ALBUTEROL) as needed  #1 box x 3   Entered by:   Baldomero Lamy LPN   Authorized by:   Tula Nakayama MD   Signed by:   Baldomero Lamy LPN on 24/26/8341   Method used:   Electronically to        [facility] (retail)       [address]       [place], [place]  [address]       Ph: [phone]       Fax: [phone]   RxID:   [id number] AMLODIPINE BESYLATE 2.5 MG TABS (AMLODIPINE BESYLATE) ONE TAB by mouth once daily.  #30 x 3   Entered by:   Baldomero Lamy LPN   Authorized by:   Tula Nakayama MD   Signed by:   Baldomero Lamy LPN on 85/12/7739   Method used:   Electronically to        [facility] [address] (retail)       [address]       [place], [place]  [address]       Ph: [phone]       Fax: [phone]   RxID:   [id number] [place] 80-4.5 MCG/ACT  AERO (BUDESONIDE-FORMOTEROL FUMARATE) Inhale one puff daily  #2 x 3   Entered by:   Baldomero Lamy LPN   Authorized by:   Tula Nakayama MD   Signed by:   Baldomero Lamy LPN on 51/70/0174   Method used:   Electronically to        [facility] (retail)       [address]       [place], [place]  [address]       Ph:  [phone]       Fax: [phone]   RxID:   [id number] PROAIR HFA 108 (90 BASE) MCG/ACT AERS (ALBUTEROL SULFATE) 2 puffs every 6 to 8 hrs as needed  #1 x 3   Entered by:   Baldomero Lamy LPN   Authorized by:   Tula Nakayama MD   Signed by:   Baldomero Lamy LPN on 26/33/3545   Method used:   Electronically to        [facility] (retail)       [place], [place]  [address]       Ph: [phone]       Fax: [phone]   RxID:   [id number]
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
## 2010-12-29 NOTE — Progress Notes (Signed)
Summary: FMLA PAPERS  Phone Note Call from Patient   Summary of Call: IS HER FMLA PAPERS READY SHE NEEDS THEM  CALL BACK AT [phone]  CW  [phone] DUE THE 15TH Initial call taken by: Dierdre Harness,  Apr 16, 2010 9:25 AM  Follow-up for Phone Call        i think I asked you to fill in these papers pls check, if you don't havethem ask randi, i do not have them Follow-up by: Tula Nakayama MD,  Apr 16, 2010 12:11 PM  Additional Follow-up for Phone Call Additional follow up Details #1::        this has been completed and on your bookcase Additional Follow-up by: Baldomero Lamy LPN,  Apr 16, 8285 7:51 PM    Additional Follow-up for Phone Call Additional follow up Details #2::    thanks, pls notuify pt that the form is ready Follow-up by: Tula Nakayama MD,  Apr 16, 2010 4:58 PM  Additional Follow-up for Phone Call Additional follow up Details #3:: Details for Additional Follow-up Action Taken: patient aware Additional Follow-up by: Dierdre Harness,  Apr 17, 2010 8:16 AM

## 2010-12-29 NOTE — Letter (Signed)
Summary: Out of Work  [facility]  [address]   [place], [place] [address]   Phone: [phone]  Fax: [phone]    January 14, 2010   Employee:  KATHEY SIMER    To Whom It May Concern:   For Medical reasons, please excuse the above named employee from work for the following dates:  Start:   01/13/10  End:   01/16/10 may return to work without restriction.  If you need additional information, please feel free to contact our office.         Sincerely,    Kennith Gain PA

## 2010-12-29 NOTE — Progress Notes (Signed)
Summary: HANDICAPP CARD  Phone Note Call from Patient   Summary of Call: NEEDS A HANDICAPP CARD TO [place] CLOSER TO [place] TO [place] AT [phone] Initial call taken by: Dierdre Harness,  May 04, 2010 8:47 AM  Follow-up for Phone Call        pls fill in handicap form , she has asthma, I will sign, when we have completed it let her know/leave in "out Box, it will be scanned in Follow-up by: Tula Nakayama MD,  May 04, 2010 12:39 PM  Additional Follow-up for Phone Call Additional follow up Details #1::        completed and put up front for scanning and pick up Additional Follow-up by: Baldomero Lamy LPN,  May 05, 2711 9:29 PM

## 2010-12-29 NOTE — Assessment & Plan Note (Signed)
Summary: FMLA PAPERS   Allergies: No Known Drug Allergies   Complete Medication List: 1)  Alprazolam 1 Mg Tbdp (Alprazolam) .... Take 1 tablet by mouth two times a day 2)  Duoneb 0.5-2.5 (3) Mg/12m Soln (Ipratropium-albuterol) .... As needed 3)  Symbicort 80-4.5 Mcg/act Aero (Budesonide-formoterol fumarate) .... Inhale one puff daily 4)  Amlodipine Besylate 2.5 Mg Tabs (Amlodipine besylate) .... One tab by mouth qd 5)  Phentermine Hcl 37.5 Mg Tabs (Phentermine hcl) .... Take 1 tablet by mouth once a day 6)  Proctofoam Hc 1-1 % Foam (Hydrocortisone ace-pramoxine) .... Apply twice dails as needed to hemmorhoids 7)  Proair Hfa 108 (90 Base) Mcg/act Aers (Albuterol sulfate) .... 2 puffs every 6 to 8 hrs as needed 8)  Promethazine-codeine 6.25-10 Mg/518mSyrp (Promethazine-codeine) ...Marland Kitchen 1-2 tsp q 6 hrs as needed for cough 9)  Phentermine Hcl 37.5 Mg Tabs (Phentermine hcl) .... Take 1 tablet by mouth once a day 10)  Depakote Er 500 Mg Xr24h-tab (Divalproex sodium) .... 2 tabs at bedtime  Other Orders: Form Completion ([id number]

## 2010-12-29 NOTE — Letter (Signed)
Summary: labs  labs   Imported By: Dierdre Harness 04/17/2010 14:47:35  _____________________________________________________________________  External Attachment:    Type:   Image     Comment:   External Document

## 2010-12-29 NOTE — Assessment & Plan Note (Signed)
Summary: office visit   Vital Signs:  Patient profile:   50 year old female Menstrual status:  hysterectomy Height:      62.5 inches Weight:      186 pounds BMI:     33.60 O2 Sat:      94 % Pulse rate:   93 / minute Pulse rhythm:   regular Resp:     16 per minute BP sitting:   112 / 80  Vitals Entered By: Kate Sable (January 08, 2010 8:26 AM)  Nutrition Counseling: Patient's BMI is greater than 25 and therefore counseled on weight management options. CC: Hemmorhoids are bothering her, and she wants a refill on the phentermine, has lost 14lbs!   Primary Care Provider:  Tula Nakayama MD  CC:  Hemmorhoids are bothering her, and she wants a refill on the phentermine, and has lost 14lbs!.  History of Present Illness: Reports  that she has been doing well. She commited to dietary change and exercise and has lost 7 punds per month for the[past 2 months since starting phentermine. She is also on valproic acid for mood stabilisation from psychiatry who she has started seeing. She reports her hemmorhoids have been bothering her in the past 2 weeks  Denies recent fever or chills. Denies sinus pressure, nasal congestion , ear pain or sore throat. Denies chest congestion, or cough productive of sputum. Denies chest pain, palpitations, PND, orthopnea or leg swelling. Denies abdominal pain, nausea, vomitting, diarrhea or constipation. Denies change in bowel movements or bloody stool. Denies dysuria , frequency, incontinence or hesitancy. Denies  joint pain, swelling, or reduced mobility. Denies headaches, vertigo, seizures.      Current Medications (verified): 1)  Alprazolam 1 Mg  Tbdp (Alprazolam) .... Take 1 Tablet By Mouth Two Times A Day 2)  Proair Hfa 108 (90 Base) Mcg/act  Aers (Albuterol Sulfate) .... Inhale Two Puffs Every Six Hours As Needed 3)  Duoneb 0.5-2.5 (3) Mg/25m  Soln (Ipratropium-Albuterol) .... As Needed 4)  Symbicort 80-4.5 Mcg/act  Aero  (Budesonide-Formoterol Fumarate) .... Inhale One Puff Daily 5)  Amlodipine Besylate 2.5 Mg Tabs (Amlodipine Besylate) .... One Tab By Mouth Qd 6)  Topamax 50 Mg Tabs (Topiramate) .... Take 1 Tablet By Mouth Two Times A Day 7)  Phentermine Hcl 37.5 Mg Tabs (Phentermine Hcl) .... Take 1 Tablet By Mouth Once A Day 8)  Divalproex Sodium 250 Mg Xr24h-Tab (Divalproex Sodium) ..Marland Kitchen. 1 Tab At Bedtime For 1 Week Then 2 Tabs At Bedtime At At Bedtime  Allergies (verified): No Known Drug Allergies  Review of Systems      See HPI Eyes:  Denies blurring and discharge. Heme:  Denies abnormal bruising and bleeding. Allergy:  Denies hives or rash and sneezing.  Physical Exam  General:  Well-developed,obese,in no acute distress; alert,appropriate and cooperative throughout examination HEENT: No facial asymmetry,  EOMI,no sinus tenderness, TM's Clear, oropharynx  pink and moist.   Chest: clear to ascultation throughout CVS: S1, S2, No murmurs, No S3.   Abd: Soft, Nontender.  MS: Adequate ROM spine, hips, shoulders and knees.  Ext: No edema.   CNS: CN 2-12 intact, power tone and sensation normal throughout.   Skin: Intact, no visible lesions or rashes.  Psych: Good eye contact, normal affect.  Memory intact, not anxious or depressed appearing.    Impression & Recommendations:  Problem # 1:  OBESITY, UNSPECIFIED (ICD-278.00) Assessment Improved  Ht: 62.5 (01/08/2010)   Wt: 186 (01/08/2010)   BMI: 33.60 (01/08/2010)  Problem #  2:  HYPERTENSION (ICD-401.9) Assessment: Improved  Her updated medication list for this problem includes:    Amlodipine Besylate 2.5 Mg Tabs (Amlodipine besylate) ..... One tab by mouth qd  BP today: 112/80 Prior BP: 122/90 (11/04/2009)  Labs Reviewed: K+: 3.9 (03/26/2009) Creat: : 1.04 (03/26/2009)   Chol: 229 (11/04/2009)   HDL: 57 (11/04/2009)   LDL: 146 (11/04/2009)   TG: 130 (11/04/2009)  Problem # 3:  EXTERNAL HEMORRHOIDS (ICD-455.3) Assessment:  Deteriorated proctofoam prescribed  Problem # 4:  ASTHMA, UNSPECIFIED, UNSPECIFIED STATUS (ICD-493.90) Assessment: Unchanged  The following medications were removed from the medication list:    Proair Hfa 108 (90 Base) Mcg/act Aers (Albuterol sulfate) ..... Inhale two puffs every six hours as needed Her updated medication list for this problem includes:    Duoneb 0.5-2.5 (3) Mg/20m Soln (Ipratropium-albuterol) ..Marland Kitchen.. As needed    Symbicort 80-4.5 Mcg/act Aero (Budesonide-formoterol fumarate) ..... Inhale one puff daily    Proair Hfa 108 (90 Base) Mcg/act Aers (Albuterol sulfate) ..Marland Kitchen.. 2 puffs every 6 to 8 hrs as needed  Complete Medication List: 1)  Alprazolam 1 Mg Tbdp (Alprazolam) .... Take 1 tablet by mouth two times a day 2)  Duoneb 0.5-2.5 (3) Mg/329mSoln (Ipratropium-albuterol) .... As needed 3)  Symbicort 80-4.5 Mcg/act Aero (Budesonide-formoterol fumarate) .... Inhale one puff daily 4)  Amlodipine Besylate 2.5 Mg Tabs (Amlodipine besylate) .... One tab by mouth qd 5)  Phentermine Hcl 37.5 Mg Tabs (Phentermine hcl) .... Take 1 tablet by mouth once a day 6)  Divalproex Sodium 250 Mg Xr24h-tab (Divalproex sodium) ...Marland Kitchen 1 tab at bedtime for 1 week then 2 tabs at bedtime at at bedtime 7)  Proctofoam Hc 1-1 % Foam (Hydrocortisone ace-pramoxine) .... Apply twice dails as needed to hemmorhoids 8)  Proair Hfa 108 (90 Base) Mcg/act Aers (Albuterol sulfate) .... 2 puffs every 6 to 8 hrs as needed  Other Orders: Radiology Referral (Radiology)  Patient Instructions: 1)  Please schedule a follow-up appointment in 2 months. 2)  It is important that you exercise regularly at least 60 minutes 5 times a week. If you develop chest pain, have severe difficulty breathing, or feel very tired , stop exercising immediately and seek medical attention. 3)  You need to lose weight. Consider a lower calorie diet and regular exercise. CONGRATS on weight loss, keep it up. 4)  I am very happy that you are going  to mental health Prescriptions: DUONEB 0.5-2.5 (3) MG/3ML  SOLN (IPRATROPIUM-ALBUTEROL) as needed  #1 box x 3   Entered by:   BrKate Sable Authorized by:   MaTula NakayamaD   Signed by:   BrKate Sablen 01/08/2010   Method used:   [facility]       [place]  [id number]     Ph: [phone]     Fax: [phone] RxID: :   [id number] HCL 37.5 MG TABS (PHENTERMINE HCL) Take 1 tablet by mouth once a day  #30 x 0   Entered by:   BrKate Sable Authorized by:   MaTula NakayamaD   Signed by:   BrKate Sablen 01/08/2010   Method used:   Printed then faxed to ...       [facility]  [address]  [place], [place]  [address]       Ph: [phone]       Fax: [phone]   RxID:   [id number] ALPRAZOLAM 1 MG  TBDP (ALPRAZOLAM) Take 1 tablet by mouth two times a day  #60 x 3   Entered by:   Kate Sable   Authorized by:   Tula Nakayama MD   Signed by:   Kate Sable on 01/08/2010   Method used:   Printed then faxed to ...       [facility]  [address] ([facility]       [address]       [place], [place]  [address]       Ph: [phone]       Fax: [phone]   RxID:   [id number] PROAIR HFA 108 (90 BASE) MCG/ACT AERS (ALBUTEROL SULFATE) 2 puffs every 6 to 8 hrs as needed  #1 x 3   Entered and Authorized by:   Tula Nakayama MD   Signed by:   Tula Nakayama MD on 01/08/2010   Method used:   Electronically to        [facility] (retail)       [address]       [place], [place]  [address]       Ph: [phone]       Fax: [phone]   RxID:   [id number] PROCTOFOAM HC 1-1 % FOAM (HYDROCORTISONE ACE-PRAMOXINE) apply twice dails as needed to hemmorhoids  #30cc x 3   Entered and Authorized by:   Tula Nakayama MD   Signed by:   Tula Nakayama  MD on 01/08/2010   Method used:   Electronically to        [facility] (retail)       [address]       [place], [place]  [address]       Ph: [phone]       Fax: [phone]   RxID:   [id number]

## 2010-12-29 NOTE — Assessment & Plan Note (Signed)
Summary: fmla paper   Allergies: No Known Drug Allergies   Complete Medication List: 1)  Alprazolam 1 Mg Tbdp (Alprazolam) .... Take 1 tablet by mouth two times a day 2)  Duoneb 0.5-2.5 (3) Mg/94m Soln (Ipratropium-albuterol) .... As needed 3)  Symbicort 80-4.5 Mcg/act Aero (Budesonide-formoterol fumarate) .... Inhale one puff daily 4)  Amlodipine Besylate 2.5 Mg Tabs (Amlodipine besylate) .... One tab by mouth once daily. 5)  Proair Hfa 108 (90 Base) Mcg/act Aers (Albuterol sulfate) .... 2 puffs every 6 to 8 hrs as needed 6)  Vitamin D (ergocalciferol) 50000 Unit Caps (Ergocalciferol) .... One capsule once weekly 7)  Lipitor 20 Mg Tabs (Atorvastatin calcium) .... Take 1 tab by mouth at bedtime  Other Orders: Form Completion (([id number]   Orders Added: 1)  Form Completion [[[id number]]

## 2010-12-29 NOTE — Letter (Signed)
Summary: history and physical  history and physical   Imported By: Dierdre Harness 04/17/2010 14:47:06  _____________________________________________________________________  External Attachment:    Type:   Image     Comment:   External Document

## 2010-12-31 NOTE — Assessment & Plan Note (Signed)
Summary: PHY PER DR   Vital Signs:  Patient profile:   50 year old female Menstrual status:  hysterectomy Height:      62.5 inches Weight:      177.75 pounds O2 Sat:      98 % on Room air Pulse rate:   83 / minute Pulse rhythm:   regular Resp:     16 per minute BP sitting:   104 / 78  (left arm)  Vitals Entered By: Baldomero Lamy LPN (November 17, 8340 9:22 AM)  O2 Flow:  Room air CC: physical Is Patient Diabetic? No Comments did not bring meds, states list is correct but states she stopped cholesterol med due to advertisment she saw   Primary Care Provider:  Tula Nakayama MD  CC:  physical.  History of Present Illness: c/o inability to walk and vision disturbance, she is awaiting evalyuation by neurologist, a referral has been put through and she is to address a balance   uncotrolled allergies with post nasal derainage  Denies recent fever or chills. Denies sinus pressure, nasal congestion , ear pain or sore throat. Denies chest congestion, or cough productive of sputum. Denies chest pain, palpitations, PND, orthopnea or leg swelling. Denies abdominal pain, nausea, vomitting, diarrhea or constipation. Denies change in bowel movements or bloody stool. Denies dysuria , frequency, incontinence or hesitancy. Denies  joint pain, swelling, or reduced mobility. Denies depression, anxiety or insomnia. Denies  rash, lesions, or itch.     Allergies: No Known Drug Allergies  Review of Systems      See HPI General:  Complains of fatigue. Eyes:  Complains of vision loss-both eyes. Derm:  Denies itching, lesion(s), and rash. Neuro:  Complains of difficulty with concentration, headaches, poor balance, visual disturbances, and weakness; denies numbness, seizures, and sensation of room spinning. Psych:  Complains of anxiety and mental problems; denies sense of great danger, suicidal thoughts/plans, thoughts of violence, and unusual visions or sounds. Endo:  Denies cold  intolerance, excessive hunger, excessive thirst, and excessive urination. Heme:  Denies abnormal bruising and bleeding. Allergy:  Complains of seasonal allergies; denies hives or rash and itching eyes; increased symptoms x 1 week.  Physical Exam  General:  Well-developed,well-nourished,in no acute distress; alert,appropriate and cooperative throughout examination Head:  Normocephalic and atraumatic without obvious abnormalities. No apparent alopecia or balding. Eyes:  No corneal or conjunctival inflammation noted. EOMI. Perrla. Funduscopic exam benign, without hemorrhages, exudates or papilledema. Vision grossly normal. Ears:  External ear exam shows no significant lesions or deformities.  Otoscopic examination reveals clear canals, tympanic membranes are intact bilaterally without bulging, retraction, inflammation or discharge. Hearing is grossly normal bilaterally. Nose:  External nasal examination shows no deformity or inflammation. Nasal mucosa are pink and moist without lesions or exudates. Mouth:  Oral mucosa and oropharynx without lesions or exudates.  Teeth in good repair. Neck:  No deformities, masses, or tenderness noted. Chest Wall:  No deformities, masses, or tenderness noted. Breasts:  No mass, nodules, thickening, tenderness, bulging, retraction, inflamation, nipple discharge or skin changes noted.   Lungs:  Normal respiratory effort, chest expands symmetrically. Lungs are clear to auscultation, no crackles or wheezes. Heart:  Normal rate and regular rhythm. S1 and S2 normal without gallop, murmur, click, rub or other extra sounds. Abdomen:  Bowel sounds positive,abdomen soft and non-tender without masses, organomegaly or hernias noted. Rectal:  No external abnormalities noted. Normal sphincter tone. swollen internal hemmorhoids Genitalia:  normal introitus, mucosa pink and moist, and no adnexal masses  or tenderness. Uterus is absent  Msk:  No deformity or scoliosis noted of  thoracic or lumbar spine.   Pulses:  R and L carotid,radial,femoral,dorsalis pedis and posterior tibial pulses are full and equal bilaterally Extremities:  No clubbing, cyanosis, edema, or deformity noted with normal full range of motion of all joints.   Neurologic:  No cranial nerve deficits noted. Station and gait are normal. Plantar reflexes are down-going bilaterally. DTRs are symmetrical throughout. Sensory, motor and coordinative functions appear intact. Skin:  Intact without suspicious lesions or rashes Cervical Nodes:  No lymphadenopathy noted Axillary Nodes:  No palpable lymphadenopathy Inguinal Nodes:  No significant adenopathy Psych:  Cognition and judgment appear intact. Alert and cooperative with normal attention span and concentration. No apparent delusions, illusions, hallucinations   Impression & Recommendations:  Problem # 1:  HYPERLIPIDEMIA (ICD-272.4) Assessment Deteriorated  Her updated medication list for this problem includes:    Lipitor 20 Mg Tabs (Atorvastatin calcium) .Marland Kitchen... Take 1 tab by mouth at bedtime med compliance stressed,as well as a low fat diet Labs Reviewed: SGOT: 13 (09/28/2010)   SGPT: 11 (09/28/2010)   HDL:56 (09/28/2010), 57 (11/04/2009)  LDL:174 (09/28/2010), 146 (11/04/2009)  Chol:261 (09/28/2010), 229 (11/04/2009)  Trig:156 (09/28/2010), 130 (11/04/2009)  Problem # 2:  OBESITY, UNSPECIFIED (ICD-278.00) Assessment: Deteriorated  Ht: 62.5 (11/17/2010)   Wt: 177.75 (11/17/2010)   BMI: 31.39 (09/22/2010) pt to start phentermine, she reports inability to exercise due to lower ext weakness  Problem # 3:  EXTERNAL HEMORRHOIDS (ICD-455.3) Assessment: Unchanged high fiber diet with stool softeners encouraged  Problem # 4:  ASTHMA, UNSPECIFIED, UNSPECIFIED STATUS (ICD-493.90) Assessment: Unchanged  Her updated medication list for this problem includes:    Duoneb 0.5-2.5 (3) Mg/11m Soln (Ipratropium-albuterol) ..Marland Kitchen.. As needed    Symbicort 80-4.5  Mcg/act Aero (Budesonide-formoterol fumarate) ..... Inhale one puff daily    Proair Hfa 108 (90 Base) Mcg/act Aers (Albuterol sulfate) ..Marland Kitchen.. 2 puffs every 6 to 8 hrs as needed  Problem # 5:  HYPERTENSION (ICD-401.9) Assessment: Unchanged  Her updated medication list for this problem includes:    Amlodipine Besylate 2.5 Mg Tabs (Amlodipine besylate) ..... One tab by mouth once daily.  BP today: 104/78 Prior BP: 122/90 (09/22/2010)  Labs Reviewed: K+: 4.3 (09/28/2010) Creat: : 0.87 (09/28/2010)   Chol: 261 (09/28/2010)   HDL: 56 (09/28/2010)   LDL: 174 (09/28/2010)   TG: 156 (09/28/2010)  Problem # 6:  ANXIETY (ICD-300.00) Assessment: Unchanged  Her updated medication list for this problem includes:    Alprazolam 1 Mg Tbdp (Alprazolam) ..Marland Kitchen.. Take 1 tablet by mouth two times a day  Problem # 7:  DEPRESSION (ICD-311) Assessment: Improved  Her updated medication list for this problem includes:    Alprazolam 1 Mg Tbdp (Alprazolam) ..Marland Kitchen.. Take 1 tablet by mouth two times a day  Complete Medication List: 1)  Alprazolam 1 Mg Tbdp (Alprazolam) .... Take 1 tablet by mouth two times a day 2)  Duoneb 0.5-2.5 (3) Mg/358mSoln (Ipratropium-albuterol) .... As needed 3)  Symbicort 80-4.5 Mcg/act Aero (Budesonide-formoterol fumarate) .... Inhale one puff daily 4)  Amlodipine Besylate 2.5 Mg Tabs (Amlodipine besylate) .... One tab by mouth once daily. 5)  Proair Hfa 108 (90 Base) Mcg/act Aers (Albuterol sulfate) .... 2 puffs every 6 to 8 hrs as needed 6)  Vitamin D (ergocalciferol) 50000 Unit Caps (Ergocalciferol) .... One capsule once weekly 7)  Lipitor 20 Mg Tabs (Atorvastatin calcium) .... Take 1 tab by mouth at bedtime 8)  Flonase 50 Mcg/act Susp (  Fluticasone propionate) .... One to two puffs per nostril daily 9)  Phentermine Hcl 37.5 Mg Tabs (Phentermine hcl) .... Take 1 tablet by mouth once a day  Other Orders: Hemoccult Guaiac-1 spec.(in office) (82270) Pap Smear (88677)  Patient  Instructions: 1)  Please schedule a follow-up appointment in 2 months. 2)  It is important that you exercise regularly at least 20 minutes 5 times a week. If you develop chest pain, have severe difficulty breathing, or feel very tired , stop exercising immediately and seek medical attention. 3)  You need to lose weight. Consider a lower calorie diet and regular exercise.  4)  PLS take HALF the phentermine once daily not a whole. 5)  Med is sent in for allergy and pls take the lipitor as prescribed. 6)  Hope ypou feel better soon Prescriptions: PHENTERMINE HCL 37.5 MG TABS (PHENTERMINE HCL) Take 1 tablet by mouth once a day  #30 x 0   Entered and Authorized by:   Tula Nakayama MD   Signed by:   Tula Nakayama MD on 11/17/2010   Method used:   Printed then faxed to ...       [facility] (retail)       [address]       [place], [place]  [address]       Ph: [phone]       Fax: [phone]   RxID:   [id number] Asencion Islam 50 MCG/ACT SUSP (FLUTICASONE PROPIONATE) one to two puffs per nostril daily  #1 x 3   Entered and Authorized by:   Tula Nakayama MD   Signed by:   Tula Nakayama MD on 11/17/2010   Method used:   Electronically to        [facility] (retail)       [address]       [place], [place]  [address]       Ph: [phone]       Fax: [phone]   RxID:   [id number]    Orders Added: 1)  Est. Patient 40-64 years [28979] 2)  Hemoccult Guaiac-1 spec.(in office) [15041] 3)  Pap Smear [[id number]]    Laboratory Results  Date/Time Received: November 17, 2010 10:49 AM  Date/Time Reported: November 17, 2010 10:49 AM   Stool - Occult Blood Hemmoccult #1: negative Date: 11/17/2010 Comments: [id number] 13L 10/13 118 10/12 [facility] LPN  November 18, 9687 10:50 AM

## 2010-12-31 NOTE — Letter (Signed)
Summary: Pap Smear, Normal Letter, [facility]  [address]   [place], [place] [address]   Phone: [phone]  Fax: [phone]          November 19, 2010    Dear: Leslie Lyons    I am pleased to notify you that your PAP smear was normal.  You will need your next PAP smear in:     ____ 3 Months    ____ 6 Months    ____ 12 Months    Please call the office at our office number above, to schedule your next appointment.    Sincerely,     [facility]

## 2011-01-22 ENCOUNTER — Ambulatory Visit: Payer: Self-pay | Admitting: Family Medicine

## 2011-01-29 ENCOUNTER — Ambulatory Visit (INDEPENDENT_AMBULATORY_CARE_PROVIDER_SITE_OTHER): Payer: PRIVATE HEALTH INSURANCE | Admitting: Family Medicine

## 2011-01-29 ENCOUNTER — Encounter: Payer: Self-pay | Admitting: Family Medicine

## 2011-01-29 ENCOUNTER — Other Ambulatory Visit: Payer: Self-pay | Admitting: Family Medicine

## 2011-01-29 DIAGNOSIS — Z139 Encounter for screening, unspecified: Secondary | ICD-10-CM

## 2011-01-29 DIAGNOSIS — F411 Generalized anxiety disorder: Secondary | ICD-10-CM

## 2011-01-29 DIAGNOSIS — J45909 Unspecified asthma, uncomplicated: Secondary | ICD-10-CM

## 2011-01-29 DIAGNOSIS — E669 Obesity, unspecified: Secondary | ICD-10-CM

## 2011-01-29 DIAGNOSIS — E785 Hyperlipidemia, unspecified: Secondary | ICD-10-CM

## 2011-02-02 ENCOUNTER — Ambulatory Visit (HOSPITAL_COMMUNITY): Admission: RE | Admit: 2011-02-02 | Payer: 59 | Source: Ambulatory Visit

## 2011-02-04 NOTE — Assessment & Plan Note (Signed)
Summary: F UP   Vital Signs:  Patient profile:   50 year old female Menstrual status:  hysterectomy Height:      62.5 inches Weight:      184.25 pounds BMI:     33.28 O2 Sat:      96 % on Room air Pulse rate:   94 / minute Pulse rhythm:   regular Resp:     16 per minute BP sitting:   120 / 80  (right arm)  Vitals Entered By: Baldomero Lamy LPN (January 29, 8675 1:95 AM)  Nutrition Counseling: Patient's BMI is greater than 25 and therefore counseled on weight management options.  O2 Flow:  Room air CC: follow-up visit Is Patient Diabetic? No   Primary Care Provider:  Tula Nakayama MD  CC:  follow-up visit.  History of Present Illness: Reports  that she is not doing well. She reports increased and uncontrolled anxiety an depression. She staes that cymbalta actually helped, but was expensive, she however wants to get her weight down before styarting the medication. She is neither suicidal nor homicidal. Denies recent fever or chills. Denies sinus pressure, nasal congestion , ear pain or sore throat. Denies chest congestion, or cough productive of sputum. Denies chest pain, palpitations, PND, orthopnea or leg swelling. Denies abdominal pain, nausea, vomitting, diarrhea or constipation. Denies change in bowel movements or bloody stool. Denies dysuria , frequency, incontinence or hesitancy. Denies  joint pain, swelling, or reduced mobility. Denies headaches, vertigo, seizures.  Denies  rash, lesions, or itch.     Current Medications (verified): 1)  Alprazolam 1 Mg  Tbdp (Alprazolam) .... Take 1 Tablet By Mouth Two Times A Day 2)  Duoneb 0.5-2.5 (3) Mg/33m  Soln (Ipratropium-Albuterol) .... As Needed 3)  Symbicort 80-4.5 Mcg/act  Aero (Budesonide-Formoterol Fumarate) .... Inhale One Puff Daily 4)  Amlodipine Besylate 2.5 Mg Tabs (Amlodipine Besylate) .... One Tab By Mouth Once Daily. 5)  Proair Hfa 108 (90 Base) Mcg/act Aers (Albuterol Sulfate) .... 2 Puffs Every 6 To 8  Hrs As Needed 6)  Vitamin D (Ergocalciferol) 50000 Unit Caps (Ergocalciferol) .... One Capsule Once Weekly 7)  Lipitor 20 Mg Tabs (Atorvastatin Calcium) .... Take 1 Tab By Mouth At Bedtime 8)  Flonase 50 Mcg/act Susp (Fluticasone Propionate) .... One To Two Puffs Per Nostril Daily 9)  Phentermine Hcl 37.5 Mg Tabs (Phentermine Hcl) .... Take 1 Tablet By Mouth Once A Day  Allergies (verified): No Known Drug Allergies  Review of Systems      See HPI General:  Complains of fatigue. Eyes:  Denies discharge and red eye. Psych:  Complains of anxiety, depression, irritability, and mental problems; denies suicidal thoughts/plans, thoughts of violence, and unusual visions or sounds. Endo:  Denies cold intolerance, excessive hunger, excessive thirst, excessive urination, and heat intolerance. Heme:  Denies abnormal bruising and bleeding. Allergy:  Complains of seasonal allergies.  Physical Exam  General:  Well-developed,well-nourished,in no acute distress; alert,appropriate and cooperative throughout examination HEENT: No facial asymmetry,  EOMI, No sinus tenderness, TM's Clear, oropharynx  pink and moist.   Chest: Clear to auscultation bilaterally.  CVS: S1, S2, No murmurs, No S3.   Abd: Soft, Nontender.  MS: Adequate ROM spine, hips, shoulders and knees.  Ext: No edema.   CNS: CN 2-12 intact, power tone and sensation normal throughout.   Skin: Intact, no visible lesions or rashes.  Psych: Good eye contact, normal affect.  Memory intact, not anxious or depressed appearing.    Impression & Recommendations:  Problem # 1:  HYPERLIPIDEMIA (ICD-272.4) Assessment Comment Only  Her updated medication list for this problem includes:    Lipitor 20 Mg Tabs (Atorvastatin calcium) .Marland Kitchen... Take 1 tab by mouth at bedtime pt to start the medication, states she "was afraid of s/e" Orders: T-Lipid Profile [id number]) T-Hepatic Function [phone])  Labs Reviewed: SGOT: 13 (09/28/2010)   SGPT:  11 (09/28/2010)   HDL:56 (09/28/2010), 57 (11/04/2009)  LDL:174 (09/28/2010), 146 (11/04/2009)  Chol:261 (09/28/2010), 229 (11/04/2009)  Trig:156 (09/28/2010), 130 (11/04/2009)  Problem # 2:  OBESITY, UNSPECIFIED (ICD-278.00) Assessment: Deteriorated  Ht: 62.5 (01/29/2011)   Wt: 184.25 (01/29/2011)   BMI: 33.28 (01/29/2011) therapeutic lifestyle change discussed and encouraged Pt to stat phentermine 0ne daily x 74monthsupply written  Problem # 3:  HYPERTENSION (ICD-401.9) Assessment: Unchanged  Her updated medication list for this problem includes:    Amlodipine Besylate 2.5 Mg Tabs (Amlodipine besylate) ..... One tab by mouth once daily.  Orders: T-Basic Metabolic Panel ([id number]  BP today: 120/80 Prior BP: 104/78 (11/17/2010)  Labs Reviewed: K+: 4.3 (09/28/2010) Creat: : 0.87 (09/28/2010)   Chol: 261 (09/28/2010)   HDL: 56 (09/28/2010)   LDL: 174 (09/28/2010)   TG: 156 (09/28/2010)  Problem # 4:  ANXIETY (ICD-300.00) Assessment: Deteriorated  Her updated medication list for this problem includes:    Alprazolam 1 Mg Tbdp (Alprazolam) ..Marland Kitchen.. Take 1 tablet by mouth two times a day  Discussed medication use and relaxation techniques.  no dose increase,addictive property discussed  Complete Medication List: 1)  Alprazolam 1 Mg Tbdp (Alprazolam) .... Take 1 tablet by mouth two times a day 2)  Duoneb 0.5-2.5 (3) Mg/324mSoln (Ipratropium-albuterol) .... As needed 3)  Symbicort 80-4.5 Mcg/act Aero (Budesonide-formoterol fumarate) .... Inhale one puff daily 4)  Amlodipine Besylate 2.5 Mg Tabs (Amlodipine besylate) .... One tab by mouth once daily. 5)  Proair Hfa 108 (90 Base) Mcg/act Aers (Albuterol sulfate) .... 2 puffs every 6 to 8 hrs as needed 6)  Vitamin D (ergocalciferol) 50000 Unit Caps (Ergocalciferol) .... One capsule once weekly 7)  Lipitor 20 Mg Tabs (Atorvastatin calcium) .... Take 1 tab by mouth at bedtime 8)  Flonase 50 Mcg/act Susp (Fluticasone propionate) ....  One to two puffs per nostril daily 9)  Phentermine Hcl 37.5 Mg Tabs (Phentermine hcl) .... Take 1 tablet by mouth once a day  Other Orders: T-Vitamin D (25-Hydroxy) ([id number] Referral (Radiology)  Patient Instructions: 1)  Please schedule a follow-up appointment in 2 months. 2)  It is important that you exercise regularly at least 60 minutes 7 times a week. If you develop chest pain, have severe difficulty breathing, or feel very tired , stop exercising immediately and seek medical attention. 3)  You need to lose weight. Consider a lower calorie diet and regular exercise.  4)  You will start phentermine once daily, we will provide a 1200 cal diet sheet. 5)  BMP prior to visit, ICD-9: 6)  Hepatic Panel prior to visit, ICD-9:  fasting in 2 months 7)  Lipid Panel prior to visit, ICD-9:, vitamin d 8)  PLS start the lipitor today Prescriptions: ALPRAZOLAM 1 MG  TBDP (ALPRAZOLAM) Take 1 tablet by mouth two times a day  #60 x 3   Entered by:   JaBaldomero LamyPN   Authorized by:   MaTula NakayamaD   Signed by:   JaBaldomero LamyPN on 0373/53/2992 Method used:   Printed then faxed to ...       [facility]  [address] ([facility]ail)       [address]       [place], [place]  [address]       Ph: [phone]       Fax: [phone]   RxID:   [id number] VITAMIN D (ERGOCALCIFEROL) 50000 UNIT CAPS (ERGOCALCIFEROL) one capsule once weekly  #4 x 3   Entered by:   Baldomero Lamy LPN   Authorized by:   Tula Nakayama MD   Signed by:   Baldomero Lamy LPN on 86/76/7209   Method used:   Electronically to        [facility] (retail)       [address]       [place], [place]  [address]       Ph: [phone]       Fax: [phone]   RxID:   [id number] AMLODIPINE BESYLATE 2.5 MG TABS (AMLODIPINE BESYLATE) ONE TAB by mouth once daily.  #30 x 3   Entered by:   Baldomero Lamy LPN   Authorized by:   Tula Nakayama MD   Signed by:    Baldomero Lamy LPN on 96/75/9163   Method used:   Electronically to        [facility] (retail)       [address]       [place], [place]  [address]       Ph: [phone]       Fax: [phone]   RxID:   [id number] PHENTERMINE HCL 37.5 MG TABS (PHENTERMINE HCL) Take 1 tablet by mouth once a day  #30 x 1   Entered and Authorized by:   Tula Nakayama MD   Signed by:   Tula Nakayama MD on 01/29/2011   Method used:   Printed then faxed to ...       [facility]  [address] (retail)       [address]       [place], [place]  [address]       Ph: [phone]       Fax: [phone]   RxID:   [id number]    Orders Added: 1)  Est. Patient Level IV [12248] 2)  T-Basic Metabolic Panel [[id number]] 3)  T-Lipid Profile [[id number]] 4)  T-Hepatic Function [[id number]] 5)  T-Vitamin D (25-Hydroxy) [[id number]] 8)  Radiology Referral [Radiology]

## 2011-02-16 ENCOUNTER — Ambulatory Visit (HOSPITAL_COMMUNITY)
Admission: RE | Admit: 2011-02-16 | Discharge: 2011-02-16 | Disposition: A | Discharge status: Home / Self Care | Payer: 59 | Source: Ambulatory Visit | Attending: Family Medicine | Admitting: Family Medicine

## 2011-02-16 DIAGNOSIS — Z139 Encounter for screening, unspecified: Secondary | ICD-10-CM

## 2011-02-16 DIAGNOSIS — Z1231 Encounter for screening mammogram for malignant neoplasm of breast: Secondary | ICD-10-CM | POA: Insufficient documentation

## 2011-03-26 ENCOUNTER — Encounter: Payer: Self-pay | Admitting: Family Medicine

## 2011-03-31 ENCOUNTER — Encounter: Payer: Self-pay | Admitting: Family Medicine

## 2011-04-02 ENCOUNTER — Ambulatory Visit: Payer: PRIVATE HEALTH INSURANCE | Admitting: Family Medicine

## 2011-04-13 NOTE — H&P (Signed)
Leslie Lyons, TYNDALL             ACCOUNT NO.:  [id number]   MEDICAL RECORD NO.:  [id number]          PATIENT TYPE:  AMB   LOCATION:  DAY                           FACILITY:  [facility]   PHYSICIAN:  Jamesetta So, M.D.  DATE OF BIRTH:  Feb 06, 1961   DATE OF ADMISSION:  DATE OF DISCHARGE:  LH                              HISTORY & PHYSICAL   CHIEF COMPLAINT:  Hematochezia, GERD.   HISTORY OF PRESENT ILLNESS:  The patient is a 50 year old black female  who was referred for endoscopic evaluation.  She needs a colonoscopy for  hematochezia, and EGD for GERD and epigastric pain.  She has been  passing some blood in her stools for many months.  No weight loss,  nausea, vomiting, diarrhea, constipation, melena have been noted.  She  has never had a colonoscopy.  There is no family history of colon  carcinoma.   PAST MEDICAL HISTORY:  Extrinsic allergies/asthma, anxiety, and  hypertension.   SURGICAL HISTORY:  Cervical cancer, hysterectomy.   CURRENT MEDICATIONS:  Alprazolam, albuterol, Symbyax, DuoNeb, Symbicort,  and Topamax.   ALLERGIES:  No known drug allergies.   REVIEW OF SYSTEMS:  Noncontributory.   PHYSICAL EXAMINATION:  GENERAL:  The patient is a well-developed, well-  nourished black female in no acute distress.  LUNGS:  Clear to auscultation with equal breath sounds bilaterally.  HEART:  Regular rate and rhythm without S3, S4, or murmurs.  ABDOMEN:  Soft, nontender, and nondistended.  No hepatosplenomegaly or  masses are noted.  RECTAL:  Deferred to the procedure.   IMPRESSION:  Hematochezia, gastroesophageal reflux disease, and  epigastric pain.   PLAN:  The patient was scheduled for an EGD and colonoscopy on December 17, 2008.  The risks and benefits of the procedure including bleeding  and perforation were fully explained to the patient, gave informed  consent.      Jamesetta So, M.D.  Electronically Signed     MAJ/MEDQ  D:  12/10/2008  T:  12/11/2008   Job:  [id number]   cc:   Norwood Levo. Moshe Cipro, M.D.  Fax: [phone]

## 2011-05-18 ENCOUNTER — Ambulatory Visit: Payer: Self-pay | Admitting: Family Medicine

## 2011-06-07 ENCOUNTER — Other Ambulatory Visit: Payer: Self-pay | Admitting: Family Medicine

## 2011-06-22 ENCOUNTER — Other Ambulatory Visit: Payer: Self-pay | Admitting: Family Medicine

## 2011-06-23 ENCOUNTER — Encounter: Payer: Self-pay | Admitting: Family Medicine

## 2011-06-25 ENCOUNTER — Ambulatory Visit (INDEPENDENT_AMBULATORY_CARE_PROVIDER_SITE_OTHER): Payer: 59 | Admitting: Family Medicine

## 2011-06-25 ENCOUNTER — Encounter: Payer: Self-pay | Admitting: Family Medicine

## 2011-06-25 VITALS — BP 120/90 | HR 95 | Resp 16 | Ht 63.0 in | Wt 188.4 lb

## 2011-06-25 DIAGNOSIS — G8929 Other chronic pain: Secondary | ICD-10-CM

## 2011-06-25 DIAGNOSIS — E785 Hyperlipidemia, unspecified: Secondary | ICD-10-CM

## 2011-06-25 DIAGNOSIS — I10 Essential (primary) hypertension: Secondary | ICD-10-CM

## 2011-06-25 DIAGNOSIS — J45909 Unspecified asthma, uncomplicated: Secondary | ICD-10-CM

## 2011-06-25 DIAGNOSIS — F411 Generalized anxiety disorder: Secondary | ICD-10-CM

## 2011-06-25 DIAGNOSIS — R5383 Other fatigue: Secondary | ICD-10-CM

## 2011-06-25 DIAGNOSIS — F329 Major depressive disorder, single episode, unspecified: Secondary | ICD-10-CM

## 2011-06-25 DIAGNOSIS — M549 Dorsalgia, unspecified: Secondary | ICD-10-CM

## 2011-06-25 DIAGNOSIS — R5381 Other malaise: Secondary | ICD-10-CM

## 2011-06-25 DIAGNOSIS — R7301 Impaired fasting glucose: Secondary | ICD-10-CM

## 2011-06-25 MED ORDER — ALPRAZOLAM 1 MG PO TABS: 1.0000 mg | ORAL_TABLET | Freq: Two times a day (BID) | ORAL | Status: DC | PRN

## 2011-06-25 MED ORDER — ATORVASTATIN CALCIUM 20 MG PO TABS: 20.0000 mg | ORAL_TABLET | Freq: Every day | ORAL | Status: DC

## 2011-06-25 MED ORDER — KETOROLAC TROMETHAMINE 60 MG/2ML IM SOLN
60.0000 mg | Freq: Once | INTRAMUSCULAR | Status: AC
  Administered 2011-06-25: 60 mg via INTRAMUSCULAR

## 2011-06-25 MED ORDER — VENLAFAXINE HCL ER 37.5 MG PO CP24: 37.5000 mg | ORAL_CAPSULE | Freq: Every day | ORAL | Status: DC

## 2011-06-25 NOTE — Patient Instructions (Addendum)
F/u in 2 months.  pls follow a diet rich in fruit and vegetable water and white meat.  You need to start doing daily exercise, and you will be started on velafaxine for depression and stress. pls go to the hospitsal if you feel out of control.you are being referred back to mental health you need to see psychiatry  Fasting lipid,hepatic, chem 7 , tSH hBA1C asap

## 2011-06-26 ENCOUNTER — Other Ambulatory Visit: Payer: Self-pay | Admitting: Family Medicine

## 2011-06-26 DIAGNOSIS — G8929 Other chronic pain: Secondary | ICD-10-CM | POA: Insufficient documentation

## 2011-06-26 NOTE — Assessment & Plan Note (Signed)
Pt to resume med for depression. Pt to re establish with psychiatry urgently, and agrees to do so She is currently not suicidal or homicidal, however reports that at times she feels pushed on her job. States if needed she will go to the hospital if she feels "out of control"

## 2011-06-26 NOTE — Progress Notes (Signed)
  Subjective:    Patient ID: Leslie Lyons, female    DOB: 1961/08/22, 50 y.o.   MRN: [id number]  HPI The PT is here for follow up and re-evaluation of chronic medical conditions, medication management and review of any available recent lab and radiology data.  Preventive health is updated, specifically  Cancer screening and Immunization.   Questions or concerns regarding consultations or procedures which the PT has had in the interim are  addressed. The PT denies any adverse reactions to current medications since the last visit.  She c/o increased mid and low back pain x 1 week, no specific related trauma that brought this on, it is non radiating. C/o uncontrolled stress and anxiety, feels "pushed " on her job, denies suicidal or homicidal plan, however relates that her stress is out of control, agrees to return to psychiatry, and will start medication for depression in the interim Frustrated with weight gain, No recent asthma flares         Review of Systems Denies recent fever or chills. Denies sinus pressure, nasal congestion, ear pain or sore throat. Denies chest congestion, productive cough or wheezing. Denies chest pains, palpitations and leg swelling Denies abdominal pain, nausea, vomiting,diarrhea or constipation.   Denies dysuria, frequency, hesitancy or incontinence.  Denies headaches, seizures, numbness, or tingling.  Denies skin break down or rash.        Objective:   Physical Exam Patient alert and oriented and in no cardiopulmonary distress.  HEENT: No facial asymmetry, EOMI, no sinus tenderness,  oropharynx pink and moist.  Neck supple no adenopathy.  Chest: Clear to auscultation bilaterally.  CVS: S1, S2 no murmurs, no S3.  ABD: Soft non tender. Bowel sounds normal.  Ext: No edema  MS: decreased  ROM spine,adequate in  shoulders, hips and knees.  Skin: Intact, no ulcerations or rash noted.  Psych: Good eye contact, normal affect.Both  anxious and  depressed appearing.Tearful  CNS: CN 2-12 intact, power, tone and sensation normal throughout.        Assessment & Plan:   ANXIETY Medication compliance addressed. Commitment to regular exercise and healthy  food choices, with portion control discussed. DASH diet and low fat diet discussed and literature offered. No Changes in medication made at this visit. Pt to re-establish with psychiatry asap   ASTHMA, UNSPECIFIED, UNSPECIFIED STATUS Controlled, no change in medication   DEPRESSION Pt to resume med for depression. Pt to re establish with psychiatry urgently, and agrees to do so She is currently not suicidal or homicidal, however reports that at times she feels pushed on her job. States if needed she will go to the hospital if she feels "out of control"  Back pain, chronic Deteriorated, toradol administered  HYPERTENSION Uncontrolled. Medication compliance addressed. Commitment to regular exercise, and healthy  eating habits with portion control discussed. DASH diet, and low fat diet discussed, and literature offered. No changes in medication at this time.

## 2011-06-26 NOTE — Assessment & Plan Note (Signed)
Uncontrolled. Medication compliance addressed. Commitment to regular exercise, and healthy  eating habits with portion control discussed. DASH diet, and low fat diet discussed, and literature offered. No changes in medication at this time.

## 2011-06-26 NOTE — Assessment & Plan Note (Signed)
Controlled, no change in medication  

## 2011-06-26 NOTE — Assessment & Plan Note (Signed)
Medication compliance addressed. Commitment to regular exercise and healthy  food choices, with portion control discussed. DASH diet and low fat diet discussed and literature offered. No Changes in medication made at this visit. Pt to re-establish with psychiatry asap

## 2011-06-26 NOTE — Assessment & Plan Note (Signed)
Deteriorated, toradol administered

## 2011-06-28 ENCOUNTER — Telehealth: Payer: Self-pay | Admitting: *Deleted

## 2011-06-28 MED ORDER — PHENTERMINE HCL 37.5 MG PO TABS: 37.5000 mg | ORAL_TABLET | Freq: Every day | ORAL | Status: DC

## 2011-06-28 NOTE — Telephone Encounter (Signed)
Med sent as requested

## 2011-06-28 NOTE — Telephone Encounter (Signed)
Yes refill x 2 pls

## 2011-07-01 LAB — HEPATIC FUNCTION PANEL
AST: 22 U/L (ref 0–37)
Alkaline Phosphatase: 54 U/L (ref 39–117)
Total Protein: 8.2 g/dL (ref 6.0–8.3)

## 2011-07-01 LAB — LIPID PANEL
Cholesterol: 217 mg/dL — ABNORMAL HIGH (ref 0–200)
VLDL: 24 mg/dL (ref 0–40)

## 2011-07-01 LAB — BASIC METABOLIC PANEL
BUN: 9 mg/dL (ref 6–23)
CO2: 32 mEq/L (ref 19–32)
Chloride: 99 mEq/L (ref 96–112)
Potassium: 4.3 mEq/L (ref 3.5–5.3)

## 2011-07-01 LAB — HEMOGLOBIN A1C: Mean Plasma Glucose: 126 mg/dL — ABNORMAL HIGH (ref <117)

## 2011-07-26 ENCOUNTER — Ambulatory Visit: Payer: 59 | Admitting: Family Medicine

## 2011-08-31 ENCOUNTER — Telehealth: Payer: Self-pay | Admitting: Family Medicine

## 2011-08-31 MED ORDER — PHENTERMINE HCL 37.5 MG PO TABS: ORAL_TABLET | ORAL | Status: DC

## 2011-08-31 NOTE — Telephone Encounter (Signed)
Sent as requested.

## 2011-09-02 ENCOUNTER — Encounter: Payer: Self-pay | Admitting: Family Medicine

## 2011-09-07 ENCOUNTER — Ambulatory Visit (INDEPENDENT_AMBULATORY_CARE_PROVIDER_SITE_OTHER): Payer: 59 | Admitting: Family Medicine

## 2011-09-07 ENCOUNTER — Encounter: Payer: Self-pay | Admitting: Family Medicine

## 2011-09-07 VITALS — BP 122/82 | HR 105 | Resp 16 | Ht 63.0 in | Wt 183.4 lb

## 2011-09-07 DIAGNOSIS — J40 Bronchitis, not specified as acute or chronic: Secondary | ICD-10-CM

## 2011-09-07 DIAGNOSIS — F3289 Other specified depressive episodes: Secondary | ICD-10-CM

## 2011-09-07 DIAGNOSIS — F329 Major depressive disorder, single episode, unspecified: Secondary | ICD-10-CM

## 2011-09-07 DIAGNOSIS — E785 Hyperlipidemia, unspecified: Secondary | ICD-10-CM

## 2011-09-07 DIAGNOSIS — F17200 Nicotine dependence, unspecified, uncomplicated: Secondary | ICD-10-CM

## 2011-09-07 DIAGNOSIS — I10 Essential (primary) hypertension: Secondary | ICD-10-CM

## 2011-09-07 DIAGNOSIS — J45909 Unspecified asthma, uncomplicated: Secondary | ICD-10-CM

## 2011-09-07 DIAGNOSIS — R7301 Impaired fasting glucose: Secondary | ICD-10-CM

## 2011-09-07 DIAGNOSIS — F411 Generalized anxiety disorder: Secondary | ICD-10-CM

## 2011-09-07 DIAGNOSIS — E669 Obesity, unspecified: Secondary | ICD-10-CM

## 2011-09-07 MED ORDER — ATORVASTATIN CALCIUM 20 MG PO TABS: 20.0000 mg | ORAL_TABLET | Freq: Every day | ORAL | Status: DC

## 2011-09-07 MED ORDER — PENICILLIN V POTASSIUM 500 MG PO TABS: 500.0000 mg | ORAL_TABLET | Freq: Three times a day (TID) | ORAL | Status: AC

## 2011-09-07 MED ORDER — ERGOCALCIFEROL 1.25 MG (50000 UT) PO CAPS: ORAL_CAPSULE | ORAL | Status: DC

## 2011-09-07 MED ORDER — AMLODIPINE BESYLATE 2.5 MG PO TABS: ORAL_TABLET | ORAL | Status: DC

## 2011-09-07 MED ORDER — CEFTRIAXONE SODIUM 500 MG IJ SOLR
500.0000 mg | Freq: Once | INTRAMUSCULAR | Status: AC
  Administered 2011-09-07: 500 mg via INTRAMUSCULAR

## 2011-09-07 MED ORDER — PHENTERMINE HCL 37.5 MG PO TABS: ORAL_TABLET | ORAL | Status: DC

## 2011-09-07 MED ORDER — CHLORPHENIRAMINE-HYDROCODONE 8-10 MG/5ML PO LQCR: 5.0000 mL | Freq: Two times a day (BID) | ORAL | Status: DC | PRN

## 2011-09-07 MED ORDER — PREDNISONE (PAK) 5 MG PO TABS: 5.0000 mg | ORAL_TABLET | ORAL | Status: DC

## 2011-09-07 MED ORDER — ALPRAZOLAM 1 MG PO TABS: 1.0000 mg | ORAL_TABLET | Freq: Two times a day (BID) | ORAL | Status: DC | PRN

## 2011-09-07 NOTE — Patient Instructions (Addendum)
F/u in 3 months. Flu vaccine visit in 3 weeks Medication sent in for acute bronchitris and asthma flare.  HBA1C and chem 7 in 3 months. LABWORK  NEEDS TO BE DONE BETWEEN 3 TO 7 DAYS BEFORE YOUR NEXT SCEDULED  VISIT.  THIS WILL IMPROVE THE QUALITY OF YOUR CARE.   Please think about quitting smoking.  This is very important for your health.  Consider setting a quit date, then cutting back or switching brands to prepare to stop.  Also think of the money you will save every day by not smoking.  Quick Tips to Quit Smoking: Fix a date i.e. keep a date in mind from when you would not touch a tobacco product to smoke  Keep yourself busy and block your mind with work loads or reading books or watching movies in malls where smoking is not allowed  Vanish off the things which reminds you about smoking for example match box, or your favorite lighter, or the pipe you used for smoking, or your favorite jeans and shirt with which you used to enjoy smoking, or the club where you used to do smoking  Try to avoid certain people places and incidences where and with whom smoking is a common factor to add on  Praise yourself with some token gifts from the money you saved by stopping smoking  Anti Smoking teams are there to help you. Join their programs  Anti-smoking Gums are there in many medical shops. Try them to quit smoking   Side-effects of Smoking: Disease caused by smoking cigarettes are emphysema, bronchitis, heart failures  Premature death  Cancer is the major side effect of smoking  Heart attacks and strokes are the quick effects of smoking causing sudden death  Some smokers lives end up with limbs amputated  Breathing problem or fast breathing is another side effect of smoking  Due to more intakes of smokes, carbon mono-oxide goes into your brain and other muscles of the body which leads to swelling of the veins and blockage to the air passage to lungs  Carbon monoxide blocks blood vessels which  leads to blockage in the flow of blood to different major body organs like heart lungs and thus leads to attacks and deaths  During pregnancy smoking is very harmful and leads to premature birth of the infant, spontaneous abortions, low weight of the infant during birth  Fat depositions to narrow and blocked blood vessels causing heart attacks  In many cases cigarette smoking caused infertility in men    congrats on weight loss, aim for 2 to 4 pounds per month.  Keep the new attitude up!

## 2011-09-07 NOTE — Assessment & Plan Note (Signed)
Improved. Pt applauded on succesful weight loss through lifestyle change, and encouraged to continue same. Weight loss goal set for the next several months.  

## 2011-09-07 NOTE — Progress Notes (Signed)
  Subjective:    Patient ID: Leslie Lyons, female    DOB: 09/28/1961, 50 y.o.   MRN: [id number]  HPI 2 week h/o cough, chest congestion and increased wheezing. Has lost weight with dietary miodification and phentermine. Improved attitude, spiritual based   Review of Systems See HPI Denies recent fever or chills. Denies sinus pressure, nasal congestion, ear pain or sore throat.  Denies chest pains, palpitations and leg swelling Denies abdominal pain, nausea, vomiting,diarrhea or constipation.   Denies dysuria, frequency, hesitancy or incontinence. Denies joint pain, swelling and limitation in mobility. Denies headaches, seizures, numbness, or tingling. Reports marked improvement in  Depression and  anxiety  Denies skin break down or rash.        Objective:   Physical Exam Patient alert and oriented and in no cardiopulmonary distress.  HEENT: No facial asymmetry, EOMI, no sinus tenderness,  oropharynx pink and moist.  Neck supple no adenopathy.  Chest: Decreased air entry, bilateral wheezes, scattered crackles  CVS: S1, S2 no murmurs, no S3.  ABD: Soft non tender. Bowel sounds normal.  Ext: No edema  MS: Adequate ROM spine, shoulders, hips and knees.  Skin: Intact, no ulcerations or rash noted.  Psych: Good eye contact, normal affect. Memory intact not anxious or depressed appearing.  CNS: CN 2-12 intact, power, tone and sensation normal throughout.        Assessment & Plan:

## 2011-09-10 NOTE — Assessment & Plan Note (Signed)
Current flare, steroid dose pack prescribed

## 2011-09-10 NOTE — Assessment & Plan Note (Signed)
Improved, continue current med

## 2011-09-10 NOTE — Assessment & Plan Note (Signed)
Acute infection antibiotics prescribed

## 2011-09-10 NOTE — Assessment & Plan Note (Signed)
Controlled, no change in medication  

## 2011-09-10 NOTE — Assessment & Plan Note (Signed)
Improved, pt reports a new attitude to life

## 2011-09-10 NOTE — Assessment & Plan Note (Signed)
Improved, but still uncontrolled, low fat diet discussed and encouraged

## 2011-09-16 ENCOUNTER — Telehealth: Payer: Self-pay | Admitting: Family Medicine

## 2011-09-17 ENCOUNTER — Other Ambulatory Visit: Payer: Self-pay | Admitting: Family Medicine

## 2011-09-17 MED ORDER — AZITHROMYCIN 250 MG PO TABS: ORAL_TABLET | ORAL | Status: AC

## 2011-09-17 NOTE — Telephone Encounter (Signed)
Please let pt know that an antibiotic has been sent in to her pharmacy

## 2011-09-17 NOTE — Telephone Encounter (Signed)
Patient is aware 

## 2011-09-28 ENCOUNTER — Telehealth: Payer: Self-pay | Admitting: Family Medicine

## 2011-09-28 ENCOUNTER — Ambulatory Visit: Payer: 59

## 2011-09-29 ENCOUNTER — Other Ambulatory Visit: Payer: Self-pay | Admitting: Family Medicine

## 2011-09-29 DIAGNOSIS — J45909 Unspecified asthma, uncomplicated: Secondary | ICD-10-CM

## 2011-09-29 DIAGNOSIS — J40 Bronchitis, not specified as acute or chronic: Secondary | ICD-10-CM

## 2011-09-29 MED ORDER — PREDNISONE (PAK) 5 MG PO TABS: 5.0000 mg | ORAL_TABLET | ORAL | Status: DC

## 2011-09-29 MED ORDER — PENICILLIN V POTASSIUM 500 MG PO TABS: 500.0000 mg | ORAL_TABLET | Freq: Three times a day (TID) | ORAL | Status: AC

## 2011-09-29 NOTE — Telephone Encounter (Signed)
Left patient a message.

## 2011-09-29 NOTE — Telephone Encounter (Signed)
pls let her know I have ordered a CXR , just needs to go to the hospital to get it, and I have senr another antibiotic and another med to the pharmacy for her, she needs to come in if the 2nd course of antibiotic does not work. Sorry she is still sick

## 2011-12-17 ENCOUNTER — Encounter: Payer: Self-pay | Admitting: Family Medicine

## 2011-12-22 ENCOUNTER — Ambulatory Visit (INDEPENDENT_AMBULATORY_CARE_PROVIDER_SITE_OTHER): Payer: 59 | Admitting: Family Medicine

## 2011-12-22 ENCOUNTER — Ambulatory Visit (HOSPITAL_COMMUNITY)
Admission: RE | Admit: 2011-12-22 | Discharge: 2011-12-22 | Disposition: A | Discharge status: Home / Self Care | Payer: 59 | Source: Ambulatory Visit | Attending: Family Medicine | Admitting: Family Medicine

## 2011-12-22 ENCOUNTER — Encounter: Payer: Self-pay | Admitting: Family Medicine

## 2011-12-22 DIAGNOSIS — G8929 Other chronic pain: Secondary | ICD-10-CM

## 2011-12-22 DIAGNOSIS — R209 Unspecified disturbances of skin sensation: Secondary | ICD-10-CM

## 2011-12-22 DIAGNOSIS — R200 Anesthesia of skin: Secondary | ICD-10-CM

## 2011-12-22 DIAGNOSIS — R102 Pelvic and perineal pain unspecified side: Secondary | ICD-10-CM

## 2011-12-22 DIAGNOSIS — E785 Hyperlipidemia, unspecified: Secondary | ICD-10-CM

## 2011-12-22 DIAGNOSIS — R059 Cough, unspecified: Secondary | ICD-10-CM | POA: Insufficient documentation

## 2011-12-22 DIAGNOSIS — I10 Essential (primary) hypertension: Secondary | ICD-10-CM | POA: Insufficient documentation

## 2011-12-22 DIAGNOSIS — F329 Major depressive disorder, single episode, unspecified: Secondary | ICD-10-CM

## 2011-12-22 DIAGNOSIS — F3289 Other specified depressive episodes: Secondary | ICD-10-CM

## 2011-12-22 DIAGNOSIS — N949 Unspecified condition associated with female genital organs and menstrual cycle: Secondary | ICD-10-CM

## 2011-12-22 DIAGNOSIS — F411 Generalized anxiety disorder: Secondary | ICD-10-CM

## 2011-12-22 DIAGNOSIS — M549 Dorsalgia, unspecified: Secondary | ICD-10-CM | POA: Insufficient documentation

## 2011-12-22 DIAGNOSIS — R05 Cough: Secondary | ICD-10-CM | POA: Insufficient documentation

## 2011-12-22 DIAGNOSIS — E669 Obesity, unspecified: Secondary | ICD-10-CM

## 2011-12-22 DIAGNOSIS — J45909 Unspecified asthma, uncomplicated: Secondary | ICD-10-CM

## 2011-12-22 MED ORDER — TRAMADOL HCL 50 MG PO TABS: ORAL_TABLET | ORAL | Status: DC

## 2011-12-22 MED ORDER — PHENTERMINE HCL 37.5 MG PO TABS: ORAL_TABLET | ORAL | Status: DC

## 2011-12-22 NOTE — Patient Instructions (Signed)
CPE in 3 months.  cXR today.  New med for back pain as discussed.  You will be referred to neurologist, also for pelvic US and back films, please ensure you keep all these appointments if you want to take your health serriously  It is important that you exercise regularly at least 30 minutes 5 times a week. If you develop chest pain, have severe difficulty breathing, or feel very tired, stop exercising immediately and seek medical attention   A healthy diet is rich in fruit, vegetables and whole grains. Poultry fish, nuts and beans are a healthy choice for protein rather then red meat. A low sodium diet and drinking 64 ounces of water daily is generally recommended. Oils and sweet should be limited. Carbohydrates especially for those who are diabetic or overweight, should be limited to 34-45 gram per meal. It is important to eat on a regular schedule, at least 3 times daily. Snacks should be primarily fruits, vegetables or nuts.  Weight loss goal of 3 pounds per month

## 2011-12-22 NOTE — Progress Notes (Signed)
  Subjective:    Patient ID: Leslie Lyons, female    DOB: [birth year], 51 y.o.   MRN: [id number]  HPI Pt concerned with h/o cervical cancer, and states after partial hyst in 1985, she had some surgery for ovarian disease in 2000. Reports intermittent lower pelvic pain. Reports low and mid back pain, states when she is walking people have to hold her back, and at times she loses the ability to walk. Still smoking and unwilling to set quit date, though cutting back Reports less anxiety, and denies depression, or hallucinations  Review of Systems See HPI Denies recent fever or chills. Denies sinus pressure, nasal congestion, ear pain or sore throat. Denies chest congestion, productive cough or wheezing. Denies chest pains, palpitations and leg swelling Denies abdominal pain, nausea, vomiting,diarrhea or constipation.   Denies dysuria, frequency, hesitancy or incontinence.  Denies , seizures,  Denies uncontrolled   depression, anxiety or insomnia. Denies skin break down or rash.        Objective:   Physical Exam Patient alert and oriented and in no cardiopulmonary distress.  HEENT: No facial asymmetry, EOMI, no sinus tenderness,  oropharynx pink and moist.  Neck supple no adenopathy.  Chest: Clear to auscultation bilaterally.Decreased air entry throughout  CVS: S1, S2 no murmurs, no S3.  ABD: Soft non tender. Bowel sounds normal.  Ext: No edema  Decreased : ROM spine, adequate in shoulders, hips and knees.  Skin: Intact, no ulcerations or rash noted.  Psych: Good eye contact, normal affect. Memory intact not anxious or depressed appearing.  CNS: CN 2-12 intact, power, tone and sensation normal throughout.        Assessment & Plan:

## 2011-12-22 NOTE — Assessment & Plan Note (Signed)
Controlled, no change in medication  

## 2011-12-22 NOTE — Assessment & Plan Note (Signed)
Low fat diet encouraged, check lab today

## 2011-12-22 NOTE — Assessment & Plan Note (Signed)
Deteriorated. Patient re-educated about  the importance of commitment to a  minimum of 150 minutes of exercise per week. The importance of healthy food choices with portion control discussed. Encouraged to start a food diary, count calories and to consider  joining a support group. Sample diet sheets offered. Goals set by the patient for the next several months.    

## 2011-12-23 LAB — HEMOGLOBIN A1C: Hgb A1c MFr Bld: 5.9 % — ABNORMAL HIGH (ref <5.7)

## 2011-12-23 LAB — BASIC METABOLIC PANEL
CO2: 27 mEq/L (ref 19–32)
Calcium: 9.5 mg/dL (ref 8.4–10.5)
Sodium: 139 mEq/L (ref 135–145)

## 2011-12-26 DIAGNOSIS — R102 Pelvic and perineal pain: Secondary | ICD-10-CM | POA: Insufficient documentation

## 2011-12-26 DIAGNOSIS — R200 Anesthesia of skin: Secondary | ICD-10-CM | POA: Insufficient documentation

## 2011-12-26 NOTE — Assessment & Plan Note (Signed)
Improved off meds, has a new attitude to life

## 2011-12-26 NOTE — Assessment & Plan Note (Addendum)
Uncontrolled, requests pain medication, states she often needs assistance with walking , will order images. C/o intermittent lower extremity weakness, disturbance in balance and vision disturbance, had seen neurology in the past needs f/u on this, no clear diagnosis as to cause of the symptoms, concerning for possible MS

## 2011-12-26 NOTE — Assessment & Plan Note (Signed)
Intermittent numbness and weakness of lower extremities with vision disturbance, needs neurologic eval will refer

## 2011-12-26 NOTE — Assessment & Plan Note (Signed)
Chronic pelvic pain with h/o partial hyst and questionable ovarian surgery will schedule pelvic US

## 2011-12-26 NOTE — Assessment & Plan Note (Signed)
Chronic anxiety maintained on anxiolytic

## 2011-12-26 NOTE — Assessment & Plan Note (Signed)
Controlled, no change in medication  

## 2011-12-27 ENCOUNTER — Telehealth: Payer: Self-pay | Admitting: Family Medicine

## 2011-12-28 NOTE — Telephone Encounter (Signed)
Patient is aware 

## 2011-12-30 ENCOUNTER — Ambulatory Visit (HOSPITAL_COMMUNITY)
Admission: RE | Admit: 2011-12-30 | Discharge: 2011-12-30 | Disposition: A | Discharge status: Home / Self Care | Payer: 59 | Source: Ambulatory Visit | Attending: Family Medicine | Admitting: Family Medicine

## 2011-12-30 ENCOUNTER — Ambulatory Visit (HOSPITAL_COMMUNITY): Payer: 59

## 2011-12-30 ENCOUNTER — Other Ambulatory Visit: Payer: Self-pay | Admitting: Family Medicine

## 2011-12-30 DIAGNOSIS — N949 Unspecified condition associated with female genital organs and menstrual cycle: Secondary | ICD-10-CM | POA: Insufficient documentation

## 2011-12-30 DIAGNOSIS — R102 Pelvic and perineal pain: Secondary | ICD-10-CM

## 2012-01-04 ENCOUNTER — Encounter: Payer: Self-pay | Admitting: Family Medicine

## 2012-01-04 ENCOUNTER — Telehealth: Payer: Self-pay | Admitting: Family Medicine

## 2012-01-04 ENCOUNTER — Ambulatory Visit (INDEPENDENT_AMBULATORY_CARE_PROVIDER_SITE_OTHER): Payer: 59 | Admitting: Family Medicine

## 2012-01-04 VITALS — BP 126/78 | HR 110 | Resp 18 | Ht 63.0 in | Wt 190.0 lb

## 2012-01-04 DIAGNOSIS — J45909 Unspecified asthma, uncomplicated: Secondary | ICD-10-CM

## 2012-01-04 DIAGNOSIS — I10 Essential (primary) hypertension: Secondary | ICD-10-CM

## 2012-01-04 MED ORDER — PREDNISONE (PAK) 5 MG PO TABS: 5.0000 mg | ORAL_TABLET | ORAL | Status: DC

## 2012-01-04 NOTE — Patient Instructions (Signed)
F/u as before.  You have a prednisone dose pack sent in for the acute asthma flare, you need to take this medication. Work excuse from 02/04 to return 01/05/2012  I am sorry to hear of your sister's illness

## 2012-01-04 NOTE — Progress Notes (Signed)
  Subjective:    Patient ID: Leslie Lyons, female    DOB: 02/18/1961, 51 y.o.   MRN: [id number]  HPI Pt reports yesterday sh developed severe asthma attack and was unable to work, she has been using her regular inhalers and also the neb machine. Denies fever, chills or productive cough. Learned yesterday that her sister has cancer and feels that this triggered the attack. Reports no sleep last night very tired , sleepy and still short of breath   Review of Systems See HPI Denies recent fever or chills. Denies sinus pressure, nasal congestion, ear pain or sore throat. Denies chest congestion, productive cough Denies chest pains, palpitations and leg swelling Denies abdominal pain, nausea, vomiting,diarrhea or constipation.   Denies depression, anxiety or insomnia. Denies skin break down or rash.        Objective:   Physical Exam Patient alert and oriented and in no cardiopulmonary distress.  HEENT: No facial asymmetry, EOMI, no sinus tenderness,  oropharynx pink and moist.  Neck supple no adenopathy.  Chest: Bilateral wheezing with decreased air entry. CVS: S1, S2 no murmurs, no S3.  ABD: Soft non tender. Bowel sounds normal.  Ext: No edema  MS: Adequate ROM spine, shoulders, hips and knees.  Skin: Intact, no ulcerations or rash noted.  Psych: Good eye contact, normal affect. Memory intact not anxious or depressed appearing.  CNS: CN 2-12 intact, power, tone and sensation normal throughout.        Assessment & Plan:

## 2012-01-04 NOTE — Telephone Encounter (Signed)
Pt needs ov. 

## 2012-01-04 NOTE — Assessment & Plan Note (Signed)
Acute bronchospasm on 2/4, dose pack and return to work on 02/06

## 2012-01-07 NOTE — Progress Notes (Signed)
Quick Note:  Called patient and left message for them to return call at the office   ______

## 2012-01-14 NOTE — Assessment & Plan Note (Signed)
Controlled, no change in medication  

## 2012-01-25 ENCOUNTER — Other Ambulatory Visit: Payer: Self-pay | Admitting: Family Medicine

## 2012-01-26 NOTE — Progress Notes (Signed)
Pt aware.

## 2012-02-07 ENCOUNTER — Other Ambulatory Visit: Payer: Self-pay

## 2012-02-07 ENCOUNTER — Ambulatory Visit (HOSPITAL_COMMUNITY)
Admission: RE | Admit: 2012-02-07 | Discharge: 2012-02-07 | Disposition: A | Discharge status: Home / Self Care | Payer: 59 | Source: Ambulatory Visit | Attending: Neurology | Admitting: Neurology

## 2012-02-07 ENCOUNTER — Telehealth: Payer: Self-pay | Admitting: Family Medicine

## 2012-02-07 ENCOUNTER — Other Ambulatory Visit: Payer: Self-pay | Admitting: Neurology

## 2012-02-07 DIAGNOSIS — M549 Dorsalgia, unspecified: Secondary | ICD-10-CM

## 2012-02-07 DIAGNOSIS — M47814 Spondylosis without myelopathy or radiculopathy, thoracic region: Secondary | ICD-10-CM | POA: Insufficient documentation

## 2012-02-07 MED ORDER — ALPRAZOLAM 1 MG PO TABS: 1.0000 mg | ORAL_TABLET | Freq: Two times a day (BID) | ORAL | Status: DC | PRN

## 2012-02-07 MED ORDER — AMLODIPINE BESYLATE 2.5 MG PO TABS: ORAL_TABLET | ORAL | Status: DC

## 2012-02-07 NOTE — Telephone Encounter (Signed)
meds refilled 

## 2012-02-21 ENCOUNTER — Encounter: Payer: Self-pay | Admitting: Family Medicine

## 2012-03-31 ENCOUNTER — Encounter: Payer: 59 | Admitting: Family Medicine

## 2012-04-23 ENCOUNTER — Other Ambulatory Visit: Payer: Self-pay | Admitting: Family Medicine

## 2012-05-01 ENCOUNTER — Encounter: Payer: Self-pay | Admitting: Family Medicine

## 2012-05-01 ENCOUNTER — Ambulatory Visit (INDEPENDENT_AMBULATORY_CARE_PROVIDER_SITE_OTHER): Payer: 59 | Admitting: Family Medicine

## 2012-05-01 ENCOUNTER — Other Ambulatory Visit: Payer: Self-pay | Admitting: Neurology

## 2012-05-01 VITALS — BP 132/86 | HR 103 | Resp 18 | Ht 63.0 in | Wt 205.0 lb

## 2012-05-01 DIAGNOSIS — J45909 Unspecified asthma, uncomplicated: Secondary | ICD-10-CM

## 2012-05-01 DIAGNOSIS — R102 Pelvic and perineal pain: Secondary | ICD-10-CM

## 2012-05-01 DIAGNOSIS — E785 Hyperlipidemia, unspecified: Secondary | ICD-10-CM

## 2012-05-01 DIAGNOSIS — E669 Obesity, unspecified: Secondary | ICD-10-CM

## 2012-05-01 DIAGNOSIS — M5416 Radiculopathy, lumbar region: Secondary | ICD-10-CM

## 2012-05-01 DIAGNOSIS — L0390 Cellulitis, unspecified: Secondary | ICD-10-CM

## 2012-05-01 DIAGNOSIS — M545 Low back pain: Secondary | ICD-10-CM

## 2012-05-01 DIAGNOSIS — I10 Essential (primary) hypertension: Secondary | ICD-10-CM

## 2012-05-01 DIAGNOSIS — N949 Unspecified condition associated with female genital organs and menstrual cycle: Secondary | ICD-10-CM

## 2012-05-01 DIAGNOSIS — L0291 Cutaneous abscess, unspecified: Secondary | ICD-10-CM

## 2012-05-01 DIAGNOSIS — F411 Generalized anxiety disorder: Secondary | ICD-10-CM

## 2012-05-01 MED ORDER — ALBUTEROL SULFATE HFA 108 (90 BASE) MCG/ACT IN AERS: 2.0000 | INHALATION_SPRAY | Freq: Four times a day (QID) | RESPIRATORY_TRACT | Status: DC | PRN

## 2012-05-01 MED ORDER — IPRATROPIUM-ALBUTEROL 0.5-2.5 (3) MG/3ML IN SOLN: 3.0000 mL | RESPIRATORY_TRACT | Status: DC | PRN

## 2012-05-01 MED ORDER — PHENTERMINE HCL 37.5 MG PO TABS: 37.5000 mg | ORAL_TABLET | Freq: Every day | ORAL | Status: DC

## 2012-05-01 MED ORDER — CEPHALEXIN 500 MG PO CAPS: 500.0000 mg | ORAL_CAPSULE | Freq: Two times a day (BID) | ORAL | Status: AC

## 2012-05-01 MED ORDER — BUDESONIDE-FORMOTEROL FUMARATE 80-4.5 MCG/ACT IN AERO: 2.0000 | INHALATION_SPRAY | Freq: Every day | RESPIRATORY_TRACT | Status: DC

## 2012-05-01 NOTE — Patient Instructions (Addendum)
CPE in 6 weeks  Resume phentermine one daily.  Reduce gabapentin to twice daily, I will let Dr Merlene Laughter know, you have gained a lot of weight and will need HBa1C and tSH if none in past 14month   The pelvic ultrasound did NOT suggest that you have cancer, the test will be repeated in 1 year  Medication is sent in for skin infection where you were bitten 10 days ago on your right arm

## 2012-05-01 NOTE — Progress Notes (Signed)
  Subjective:    Patient ID: Leslie Lyons, female    DOB: September 07, 1961, 51 y.o.   MRN: [id number]  HPI The PT is here for follow up and re-evaluation of chronic medical conditions, medication management and review of any available recent lab and radiology data.  Preventive health is updated, specifically  Cancer screening and Immunization.   Questions or concerns regarding consultations or procedures which the PT has had in the interim are  Addressed.She has even more specific questions about a recent pelvic ultrasound she is concerned about cancer. She has fMLA forms to be completed  The PT has concerns about excessive weight gain, despite regular exercise , the concern is that this may be medication triggered. I will certainly get a message to her neurologist, she is also to resume phentermine to help with weight loss. C/o redness and pain in area of recent insect bite     Review of Systems See HPI Denies recent fever or chills. Denies sinus pressure, nasal congestion, ear pain or sore throat. Denies chest congestion, productive cough or wheezing. Denies chest pains, palpitations and leg swelling Denies abdominal pain, nausea, vomiting,diarrhea or constipation.   Denies dysuria, frequency, hesitancy or incontinence. Denies joint pain, swelling and limitation in mobility. Denies headaches, seizures, numbness, or tingling. Denies uncontrolled  depression, anxiety or insomnia.Anxiety is however increased due to weight gain       Objective:   Physical Exam  Patient alert and oriented and in no cardiopulmonary distress.  HEENT: No facial asymmetry, EOMI, no sinus tenderness,  oropharynx pink and moist.  Neck supple no adenopathy.  Chest: Clear to auscultation bilaterally.decraesed air entry , scattered wheezes  CVS: S1, S2 no murmurs, no S3.  ABD: Soft non tender. Bowel sounds normal.  Ext: No edema  MS: Adequate ROM spine, shoulders, hips and knees.  Skin: cellulitis on  forearm in area of recent insect bite Psych: Good eye contact, normal affect. Memory intact not anxious or depressed appearing.  CNS: CN 2-12 intact, power, tone and sensation normal throughout. Pelvic US reviewed in office , report is benign       Assessment & Plan:

## 2012-05-02 NOTE — Assessment & Plan Note (Signed)
Deteriorated. Patient re-educated about  the importance of commitment to a  minimum of 150 minutes of exercise per week. The importance of healthy food choices with portion control discussed. Encouraged to start a food diary, count calories and to consider  joining a support group. Sample diet sheets offered. Goals set by the patient for the next several months.   Resume phentermine, contact neurology re reduction in gabapentin which is a likely contributor

## 2012-05-02 NOTE — Assessment & Plan Note (Signed)
reviewed pelvic ultrasound with pt, reassured her based on report no concern about cancer, and  that a rept [place] would be ordered in 1 year. Offered gyne eval, she declined stating she was fine with review

## 2012-05-02 NOTE — Assessment & Plan Note (Signed)
Controlled, no change in medication  

## 2012-05-02 NOTE — Assessment & Plan Note (Signed)
Hyperlipidemia:Low fat diet discussed and encouraged.  Updated labs needed

## 2012-05-02 NOTE — Assessment & Plan Note (Signed)
Mild cellulitis in area of recent insect bite antibiotics prescribed

## 2012-05-12 ENCOUNTER — Ambulatory Visit (HOSPITAL_COMMUNITY): Admission: RE | Admit: 2012-05-12 | Payer: 59 | Source: Ambulatory Visit

## 2012-05-15 ENCOUNTER — Telehealth: Payer: Self-pay | Admitting: Family Medicine

## 2012-05-15 NOTE — Telephone Encounter (Signed)
Put the paperwork in her box for completion

## 2012-05-17 ENCOUNTER — Ambulatory Visit (HOSPITAL_COMMUNITY)
Admission: RE | Admit: 2012-05-17 | Discharge: 2012-05-17 | Disposition: A | Discharge status: Home / Self Care | Payer: 59 | Source: Ambulatory Visit | Attending: Neurology | Admitting: Neurology

## 2012-05-17 DIAGNOSIS — M5020 Other cervical disc displacement, unspecified cervical region: Secondary | ICD-10-CM | POA: Insufficient documentation

## 2012-05-17 DIAGNOSIS — M545 Low back pain, unspecified: Secondary | ICD-10-CM | POA: Insufficient documentation

## 2012-05-17 DIAGNOSIS — M5416 Radiculopathy, lumbar region: Secondary | ICD-10-CM

## 2012-05-17 DIAGNOSIS — M5124 Other intervertebral disc displacement, thoracic region: Secondary | ICD-10-CM | POA: Insufficient documentation

## 2012-05-18 ENCOUNTER — Other Ambulatory Visit: Payer: Self-pay | Admitting: Family Medicine

## 2012-05-18 NOTE — Assessment & Plan Note (Signed)
Increased due to weight gain, no change in med management

## 2012-06-14 ENCOUNTER — Other Ambulatory Visit: Payer: Self-pay | Admitting: Family Medicine

## 2012-06-14 ENCOUNTER — Encounter: Payer: Self-pay | Admitting: Family Medicine

## 2012-06-14 ENCOUNTER — Other Ambulatory Visit (HOSPITAL_COMMUNITY)
Admission: RE | Admit: 2012-06-14 | Discharge: 2012-06-14 | Disposition: A | Discharge status: Home / Self Care | Payer: 59 | Source: Ambulatory Visit | Attending: Family Medicine | Admitting: Family Medicine

## 2012-06-14 ENCOUNTER — Ambulatory Visit (INDEPENDENT_AMBULATORY_CARE_PROVIDER_SITE_OTHER): Payer: 59 | Admitting: Family Medicine

## 2012-06-14 VITALS — BP 122/82 | HR 96 | Resp 16 | Ht 63.0 in | Wt 198.0 lb

## 2012-06-14 DIAGNOSIS — I10 Essential (primary) hypertension: Secondary | ICD-10-CM

## 2012-06-14 DIAGNOSIS — Z01419 Encounter for gynecological examination (general) (routine) without abnormal findings: Secondary | ICD-10-CM | POA: Insufficient documentation

## 2012-06-14 DIAGNOSIS — E785 Hyperlipidemia, unspecified: Secondary | ICD-10-CM

## 2012-06-14 DIAGNOSIS — Z1211 Encounter for screening for malignant neoplasm of colon: Secondary | ICD-10-CM

## 2012-06-14 DIAGNOSIS — G8929 Other chronic pain: Secondary | ICD-10-CM

## 2012-06-14 DIAGNOSIS — Z0000 Encounter for general adult medical examination without abnormal findings: Secondary | ICD-10-CM

## 2012-06-14 DIAGNOSIS — R5381 Other malaise: Secondary | ICD-10-CM

## 2012-06-14 DIAGNOSIS — M549 Dorsalgia, unspecified: Secondary | ICD-10-CM

## 2012-06-14 DIAGNOSIS — E559 Vitamin D deficiency, unspecified: Secondary | ICD-10-CM

## 2012-06-14 DIAGNOSIS — R5383 Other fatigue: Secondary | ICD-10-CM

## 2012-06-14 DIAGNOSIS — Z139 Encounter for screening, unspecified: Secondary | ICD-10-CM

## 2012-06-14 DIAGNOSIS — E669 Obesity, unspecified: Secondary | ICD-10-CM

## 2012-06-14 LAB — POC HEMOCCULT BLD/STL (OFFICE/1-CARD/DIAGNOSTIC): Fecal Occult Blood, POC: NEGATIVE

## 2012-06-14 MED ORDER — PHENTERMINE HCL 37.5 MG PO TABS: 37.5000 mg | ORAL_TABLET | Freq: Every day | ORAL | Status: DC

## 2012-06-14 NOTE — Patient Instructions (Addendum)
F/u in 3 month  Fasting cbc, lipid, chem 7 , hBA1c and tSH and vit d before follow up  congrats on weight loss, continue healthy lifestyle, regular exercise and healthy dies  QUIT date 07-13-23   Please think about quitting smoking.  This is very important for your health.  Consider setting a quit date, then cutting back or switching brands to prepare to stop.  Also think of the money you will save every day by not smoking.  Quick Tips to Quit Smoking: Fix a date i.e. keep a date in mind from when you would not touch a tobacco product to smoke  Keep yourself busy and block your mind with work loads or reading books or watching movies in malls where smoking is not allowed  Vanish off the things which reminds you about smoking for example match box, or your favorite lighter, or the pipe you used for smoking, or your favorite jeans and shirt with which you used to enjoy smoking, or the club where you used to do smoking  Try to avoid certain people places and incidences where and with whom smoking is a common factor to add on  Praise yourself with some token gifts from the money you saved by stopping smoking  Anti Smoking teams are there to help you. Join their programs  Anti-smoking Gums are there in many medical shops. Try them to quit smoking   Side-effects of Smoking: Disease caused by smoking cigarettes are emphysema, bronchitis, heart failures  Premature death  Cancer is the major side effect of smoking  Heart attacks and strokes are the quick effects of smoking causing sudden death  Some smokers lives end up with limbs amputated  Breathing problem or fast breathing is another side effect of smoking  Due to more intakes of smokes, carbon mono-oxide goes into your brain and other muscles of the body which leads to swelling of the veins and blockage to the air passage to lungs  Carbon monoxide blocks blood vessels which leads to blockage in the flow of blood to different major body organs  like heart lungs and thus leads to attacks and deaths  During pregnancy smoking is very harmful and leads to premature birth of the infant, spontaneous abortions, low weight of the infant during birth  Fat depositions to narrow and blocked blood vessels causing heart attacks  In many cases cigarette smoking caused infertility in men  Mammogram will be scheduled at exit

## 2012-06-14 NOTE — Assessment & Plan Note (Signed)
Improved. Pt applauded on succesful weight loss through lifestyle change, and encouraged to continue same. Weight loss goal set for the next several months.  

## 2012-06-14 NOTE — Progress Notes (Signed)
  Subjective:    Patient ID: Leslie Lyons, female    DOB: 1961/02/28, 51 y.o.   MRN: [id number]  HPI  The PT is here for annual exam  and re-evaluation of chronic medical conditions, medication management and review of any available recent lab and radiology data.  Preventive health is updated, specifically  Cancer screening and Immunization.   Questions or concerns regarding consultations or procedures which the PT has had in the interim are  Addressed.Report of recent MRI spine reviewed she has f/u appt to be rescheduled with the ordering MD, neurologist The PT denies any adverse reactions to current medications since the last visit.  She is extremely happy with the fact that she has lost weight with change in her medication ad recommitment to regular exercise and dietary change, wants to continue phentermine   Review of Systems See HPI Denies recent fever or chills. Denies sinus pressure, nasal congestion, ear pain or sore throat. Denies chest congestion, productive cough or wheezing.No recent asthma flare Denies chest pains, palpitations and leg swelling Denies abdominal pain, nausea, vomiting,diarrhea or constipation.   Denies dysuria, frequency, hesitancy or incontinence. 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 Denies headaches, seizures, numbness, or tingling. Denies uncontrolled  depression, anxiety or insomnia. Denies skin break down or rash.        Objective:   Physical Exam  Pleasant well nourished female, alert and oriented x 3, in no cardio-pulmonary distress. Afebrile. HEENT No facial trauma or asymetry. Sinuses non tender. Fundoscopy, no hemmorhage or exudate EOMI, PERTL, External ears normal, tympanic membranes clear. Oropharynx moist, no exudate, poor  dentition. Neck: supple, no adenopathy,JVD or thyromegaly.No bruits.  Chest: Clear to ascultation bilaterally.No crackles or wheezes. Non tender to  palpation  Breast: No asymetry,no masses. No nipple discharge or inversion. No axillary or supraclavicular adenopathy  Cardiovascular system; Heart sounds normal,  S1 and  S2 ,no S3.  No murmur, or thrill. Apical beat not displaced Peripheral pulses normal.  Abdomen: Soft, non tender, no organomegaly or masses. No bruits. Bowel sounds normal. No guarding, tenderness or rebound.  Rectal:  No mass. Guaiac negative stool.  GU: External genitalia normal. No lesions. Vaginal canal normal.No discharge. Uterus absent  no adnexal masses, no  adnexal tenderness.  Musculoskeletal exam: Adequate  ROM of spine, hips , shoulders and knees. No deformity ,swelling or crepitus noted. No muscle wasting or atrophy.   Neurologic: Cranial nerves 2 to 12 intact. Power, tone ,sensation and reflexes normal throughout. No disturbance in gait. No tremor.  Skin: Intact, no ulceration, erythema , scaling or rash noted. Pigmentation normal throughout  Psych; Normal mood and affect. Judgement and concentration normal       Assessment & Plan:

## 2012-06-14 NOTE — Assessment & Plan Note (Signed)
Counseled re healthy lifestyle in terms of diet and commitment to regular exercise. Quit date set for smoking July 31. Weight loss goal of 4 pounds per month

## 2012-06-18 NOTE — Assessment & Plan Note (Signed)
Unchanged, recent MRI through neurology shows arthritis and disc disease

## 2012-06-18 NOTE — Assessment & Plan Note (Signed)
Controlled, no change in medication  

## 2012-06-19 ENCOUNTER — Ambulatory Visit (HOSPITAL_COMMUNITY): Admission: RE | Admit: 2012-06-19 | Payer: 59 | Source: Ambulatory Visit

## 2012-06-20 ENCOUNTER — Other Ambulatory Visit: Payer: Self-pay

## 2012-06-20 MED ORDER — AMLODIPINE BESYLATE 2.5 MG PO TABS: ORAL_TABLET | ORAL | Status: DC

## 2012-06-20 MED ORDER — ALPRAZOLAM 1 MG PO TABS: ORAL_TABLET | ORAL | Status: DC

## 2012-06-24 ENCOUNTER — Other Ambulatory Visit: Payer: Self-pay | Admitting: Family Medicine

## 2012-06-25 ENCOUNTER — Other Ambulatory Visit: Payer: Self-pay | Admitting: Family Medicine

## 2012-07-19 ENCOUNTER — Other Ambulatory Visit: Payer: Self-pay | Admitting: Family Medicine

## 2012-09-12 ENCOUNTER — Ambulatory Visit: Payer: 59 | Admitting: Family Medicine

## 2012-09-18 ENCOUNTER — Other Ambulatory Visit: Payer: Self-pay | Admitting: Family Medicine

## 2012-10-05 ENCOUNTER — Encounter: Payer: Self-pay | Admitting: Family Medicine

## 2012-10-05 ENCOUNTER — Ambulatory Visit: Payer: 59 | Admitting: Family Medicine

## 2012-11-14 ENCOUNTER — Other Ambulatory Visit: Payer: Self-pay | Admitting: Family Medicine

## 2012-11-16 ENCOUNTER — Ambulatory Visit (HOSPITAL_COMMUNITY): Payer: 59

## 2012-11-20 ENCOUNTER — Ambulatory Visit (INDEPENDENT_AMBULATORY_CARE_PROVIDER_SITE_OTHER): Payer: 59 | Admitting: Family Medicine

## 2012-11-20 ENCOUNTER — Ambulatory Visit (HOSPITAL_COMMUNITY)
Admission: RE | Admit: 2012-11-20 | Discharge: 2012-11-20 | Disposition: A | Discharge status: Home / Self Care | Payer: 59 | Source: Ambulatory Visit | Attending: Family Medicine | Admitting: Family Medicine

## 2012-11-20 ENCOUNTER — Encounter: Payer: Self-pay | Admitting: Family Medicine

## 2012-11-20 ENCOUNTER — Telehealth: Payer: Self-pay | Admitting: Family Medicine

## 2012-11-20 VITALS — BP 140/90 | HR 104 | Resp 18 | Ht 63.0 in | Wt 202.0 lb

## 2012-11-20 DIAGNOSIS — Z1231 Encounter for screening mammogram for malignant neoplasm of breast: Secondary | ICD-10-CM | POA: Insufficient documentation

## 2012-11-20 DIAGNOSIS — G8929 Other chronic pain: Secondary | ICD-10-CM

## 2012-11-20 DIAGNOSIS — I10 Essential (primary) hypertension: Secondary | ICD-10-CM

## 2012-11-20 DIAGNOSIS — M549 Dorsalgia, unspecified: Secondary | ICD-10-CM

## 2012-11-20 DIAGNOSIS — E669 Obesity, unspecified: Secondary | ICD-10-CM

## 2012-11-20 DIAGNOSIS — J45909 Unspecified asthma, uncomplicated: Secondary | ICD-10-CM

## 2012-11-20 DIAGNOSIS — Z139 Encounter for screening, unspecified: Secondary | ICD-10-CM

## 2012-11-20 DIAGNOSIS — E785 Hyperlipidemia, unspecified: Secondary | ICD-10-CM

## 2012-11-20 DIAGNOSIS — F411 Generalized anxiety disorder: Secondary | ICD-10-CM

## 2012-11-20 DIAGNOSIS — Z1329 Encounter for screening for other suspected endocrine disorder: Secondary | ICD-10-CM

## 2012-11-20 DIAGNOSIS — R32 Unspecified urinary incontinence: Secondary | ICD-10-CM | POA: Insufficient documentation

## 2012-11-20 LAB — POCT URINALYSIS DIPSTICK
Bilirubin, UA: NEGATIVE
Glucose, UA: NEGATIVE
Leukocytes, UA: NEGATIVE
Nitrite, UA: NEGATIVE

## 2012-11-20 MED ORDER — OXYBUTYNIN CHLORIDE ER 10 MG PO TB24: 10.0000 mg | ORAL_TABLET | Freq: Every day | ORAL | Status: DC

## 2012-11-20 MED ORDER — METHYLPREDNISOLONE ACETATE 80 MG/ML IJ SUSP
80.0000 mg | Freq: Once | INTRAMUSCULAR | Status: AC
  Administered 2012-11-20: 80 mg via INTRAMUSCULAR

## 2012-11-20 MED ORDER — PREDNISONE (PAK) 5 MG PO TABS: 5.0000 mg | ORAL_TABLET | ORAL | Status: DC

## 2012-11-20 MED ORDER — PHENTERMINE HCL 37.5 MG PO TBDP: 1.0000 | ORAL_TABLET | Freq: Every day | ORAL | Status: DC

## 2012-11-20 MED ORDER — KETOROLAC TROMETHAMINE 60 MG/2ML IJ SOLN
60.0000 mg | Freq: Once | INTRAMUSCULAR | Status: AC
  Administered 2012-11-20: 60 mg via INTRAMUSCULAR

## 2012-11-20 NOTE — Assessment & Plan Note (Signed)
Uncontrolled and severe.  Toradol and depo medrol in office and prednisone dose pack

## 2012-11-20 NOTE — Assessment & Plan Note (Addendum)
2 month history , needs urologic eval as well as further neurologic eval  CCUA today and trial of  oxybutynin

## 2012-11-20 NOTE — Patient Instructions (Addendum)
F/U in 2 month  Toradol and depo medrol in office today for back pain , and prednisone sent in as well.  New medication to be started for urinary incontinence, and you absolutely need to return for further neurologic evaluation, I will also refer you to a urologist  You will resume phentermine, need to return in 2 months   Weight loss goal of 4 pounds per month  Fasting lipid, cmp , hBA1c , TSH, cbc this week  Please get the flu vaccine at your pharmacy  Urine will be checked today  You will get your FMLA form today

## 2012-11-20 NOTE — Progress Notes (Signed)
  Subjective:    Patient ID: Leslie Lyons, female    DOB: [birth year], 51 y.o.   MRN: [id number]  HPI The PT is here for follow up and re-evaluation of chronic medical conditions, medication management and review of any available recent lab and radiology data.  Preventive health is updated, specifically  Cancer screening and Immunization.   Questions or concerns regarding consultations or procedures which the PT has had in the interim are  addressed. The PT denies any adverse reactions to current medications since the last visit.  C/o increased weight with uncontrolled back and lower extremity pain 2 month h/o urinary incontinence     Review of Systems See HPI Denies recent fever or chills. Denies sinus pressure, nasal congestion, ear pain or sore throat. Denies chest congestion, productive cough or wheezing. Denies chest pains, palpitations and leg swelling Denies abdominal pain, nausea, vomiting,diarrhea or constipation.     Denies headaches, seizures, numbness, or tingling. Denies depression, anxiety or insomnia. Denies skin break down or rash.        Objective:   Physical Exam  Patient alert and oriented and in no cardiopulmonary distress.  HEENT: No facial asymmetry, EOMI, no sinus tenderness,  oropharynx pink and moist.  Neck supple no adenopathy.  Chest: Clear to auscultation bilaterally.  CVS: S1, S2 no murmurs, no S3.  ABD: Soft non tender. Bowel sounds normal.  Ext: No edema  MS: decrease ROM spine,adequate in  shoulders, hips and knees.  Skin: Intact, no ulcerations or rash noted.  Psych: Good eye contact, normal affect. Memory intact not anxious or depressed appearing.  CNS: CN 2-12 intact, power, tone and sensation normal throughout.       Assessment & Plan:

## 2012-11-20 NOTE — Assessment & Plan Note (Signed)
No recent fl;ared, well controlled at this time

## 2012-11-20 NOTE — Assessment & Plan Note (Signed)
Deteriorated. Patient re-educated about  the importance of commitment to a  minimum of 150 minutes of exercise per week. The importance of healthy food choices with portion control discussed. Encouraged to start a food diary, count calories and to consider  joining a support group. Sample diet sheets offered. Goals set by the patient for the next several months. Resume phentermine

## 2012-11-22 LAB — URINE CULTURE: Organism ID, Bacteria: NO GROWTH

## 2012-11-23 NOTE — Telephone Encounter (Signed)
Patients [employer] did not cover: 03/012/12 $104.89 [id number] 06/25/11 $33.80  [id number] 09/07/11 $24.82  [id number] 09/07/11 $33.80  [id number] 01/04/12 $25.00  [id number]   Patient was mailed multiple invoices for outstanding balances.

## 2012-11-24 LAB — URINALYSIS, ROUTINE W REFLEX MICROSCOPIC
Bilirubin Urine: NEGATIVE
Specific Gravity, Urine: 1.024 (ref 1.005–1.030)
pH: 6 (ref 5.0–8.0)

## 2012-11-24 LAB — URINALYSIS, MICROSCOPIC ONLY
Casts: NONE SEEN
Crystals: NONE SEEN

## 2012-12-06 NOTE — Telephone Encounter (Signed)
MAILED THIS TO PATIENT

## 2012-12-10 NOTE — Assessment & Plan Note (Signed)
Increased due to stress , with weight gain, behavioral techniques to cope discussed Continue xanax as before

## 2012-12-10 NOTE — Assessment & Plan Note (Signed)
Controlled, no change in medication DASH diet and commitment to daily physical activity for a minimum of 30 minutes discussed and encouraged, as a part of hypertension management. The importance of attaining a healthy weight is also discussed.  

## 2012-12-10 NOTE — Assessment & Plan Note (Signed)
Updated lab needed Hyperlipidemia:Low fat diet discussed and encouraged.   

## 2012-12-12 ENCOUNTER — Encounter (HOSPITAL_COMMUNITY): Payer: Self-pay | Admitting: *Deleted

## 2012-12-12 ENCOUNTER — Emergency Department (HOSPITAL_COMMUNITY): Payer: 59

## 2012-12-12 ENCOUNTER — Telehealth: Payer: Self-pay | Admitting: Family Medicine

## 2012-12-12 ENCOUNTER — Emergency Department (HOSPITAL_COMMUNITY)
Admission: EM | Admit: 2012-12-12 | Discharge: 2012-12-12 | Disposition: A | Discharge status: Home / Self Care | Payer: 59 | Attending: Emergency Medicine | Admitting: Emergency Medicine

## 2012-12-12 DIAGNOSIS — Z8659 Personal history of other mental and behavioral disorders: Secondary | ICD-10-CM | POA: Insufficient documentation

## 2012-12-12 DIAGNOSIS — J45909 Unspecified asthma, uncomplicated: Secondary | ICD-10-CM | POA: Insufficient documentation

## 2012-12-12 DIAGNOSIS — R32 Unspecified urinary incontinence: Secondary | ICD-10-CM

## 2012-12-12 DIAGNOSIS — I10 Essential (primary) hypertension: Secondary | ICD-10-CM | POA: Insufficient documentation

## 2012-12-12 DIAGNOSIS — N390 Urinary tract infection, site not specified: Secondary | ICD-10-CM | POA: Insufficient documentation

## 2012-12-12 DIAGNOSIS — F17200 Nicotine dependence, unspecified, uncomplicated: Secondary | ICD-10-CM | POA: Insufficient documentation

## 2012-12-12 DIAGNOSIS — IMO0002 Reserved for concepts with insufficient information to code with codable children: Secondary | ICD-10-CM | POA: Insufficient documentation

## 2012-12-12 DIAGNOSIS — Z8679 Personal history of other diseases of the circulatory system: Secondary | ICD-10-CM | POA: Insufficient documentation

## 2012-12-12 DIAGNOSIS — Z8541 Personal history of malignant neoplasm of cervix uteri: Secondary | ICD-10-CM | POA: Insufficient documentation

## 2012-12-12 DIAGNOSIS — Z79899 Other long term (current) drug therapy: Secondary | ICD-10-CM | POA: Insufficient documentation

## 2012-12-12 DIAGNOSIS — R55 Syncope and collapse: Secondary | ICD-10-CM | POA: Insufficient documentation

## 2012-12-12 LAB — CBC
MCHC: 33 g/dL (ref 30.0–36.0)
MCV: 85.8 fL (ref 78.0–100.0)
Platelets: 335 10*3/uL (ref 150–400)
RDW: 14.2 % (ref 11.5–15.5)
WBC: 9.3 10*3/uL (ref 4.0–10.5)

## 2012-12-12 LAB — ETHANOL: Alcohol, Ethyl (B): <11 mg/dL (ref 0–11)

## 2012-12-12 LAB — URINALYSIS, ROUTINE W REFLEX MICROSCOPIC
Ketones, ur: NEGATIVE mg/dL
Protein, ur: NEGATIVE mg/dL
Urobilinogen, UA: 0.2 mg/dL (ref 0.0–1.0)

## 2012-12-12 LAB — URINE MICROSCOPIC-ADD ON

## 2012-12-12 LAB — BASIC METABOLIC PANEL
Calcium: 9.8 mg/dL (ref 8.4–10.5)
Chloride: 100 mEq/L (ref 96–112)
Creatinine, Ser: 0.76 mg/dL (ref 0.50–1.10)
GFR calc Af Amer: >90 mL/min (ref >90)
GFR calc non Af Amer: >90 mL/min (ref >90)

## 2012-12-12 LAB — RAPID URINE DRUG SCREEN, HOSP PERFORMED
Amphetamines: NOT DETECTED
Opiates: NOT DETECTED

## 2012-12-12 MED ORDER — CIPROFLOXACIN HCL 250 MG PO TABS
500.0000 mg | ORAL_TABLET | Freq: Once | ORAL | Status: AC
  Administered 2012-12-12: 500 mg via ORAL
  Filled 2012-12-12: qty 2

## 2012-12-12 MED ORDER — CIPROFLOXACIN HCL 250 MG PO TABS: 250.0000 mg | ORAL_TABLET | Freq: Two times a day (BID) | ORAL | Status: DC

## 2012-12-12 MED ORDER — SODIUM CHLORIDE 0.9 % IV SOLN
Freq: Once | INTRAVENOUS | Status: AC
  Administered 2012-12-12: 1000 mL via INTRAVENOUS

## 2012-12-12 NOTE — Telephone Encounter (Signed)
pls explain to this pt that the urine specimen she sent from the office showed no infection, as is documented in her permanenet record. See microb report of 12/23 says no growth

## 2012-12-12 NOTE — ED Provider Notes (Signed)
History     CSN: [id number]  Arrival date & time 12/12/12  0206   First MD Initiated Contact with Patient 12/12/12 0215      Chief Complaint  Patient presents with  . Seizures    (Consider location/radiation/quality/duration/timing/severity/associated sxs/prior treatment) HPI Leslie Lyons is a 52 y.o. female brought in by ambulance, who presents to the Emergency Department complaining of unresponsiveness at work and possible seizure activity en route per EMS. Patient was at work tonight and sitting in a chair. A coworker could not get her to respond. EMS was called. While patient was in the ambulance she had a witnessed apparent seizure. Upon arrival in the ER, patient was initially slow to respond to questions. Now at baseline. She reports being under a lot of stress recently and has been experiencing urinary incontinence. She is scheduled to see a urologist 12/26/12. She is taking oxybutynin and has been on xanax for anxiety.  PCP Dr. Moshe Cipro   Past Medical History  Diagnosis Date  . Hypertension   . Anxiety   . Depression   . Bronchial asthma     with acute exacerbation  . Headache     with reduced vision in right eye for one month   . Cancer 1985    cervical    Past Surgical History  Procedure Date  . Partial hysterectomy   . Cryotherapy     for abnormal pap    Family History  Problem Relation Age of Onset  . Cancer Mother     lung   . COPD Father   . Hypertension Father     History  Substance Use Topics  . Smoking status: Current Some Day Smoker  . Smokeless tobacco: Not on file  . Alcohol Use: No    OB History    Grav Para Term Preterm Abortions TAB SAB Ect Mult Living                  Review of Systems  Constitutional: Negative for fever.       10 Systems reviewed and are negative for acute change except as noted in the HPI.  HENT: Negative for congestion.   Eyes: Negative for discharge and redness.  Respiratory: Negative for cough and  shortness of breath.   Cardiovascular: Negative for chest pain.  Gastrointestinal: Negative for vomiting and abdominal pain.  Genitourinary:       Urinary incontinence  Musculoskeletal: Negative for back pain.  Skin: Negative for rash.  Neurological: Positive for seizures. Negative for syncope, numbness and headaches.  Psychiatric/Behavioral:       No behavior change.    Allergies  Review of patient's allergies indicates no known allergies.  Home Medications   Current Outpatient Rx  Name  Route  Sig  Dispense  Refill  . ALBUTEROL SULFATE HFA 108 (90 BASE) MCG/ACT IN AERS   Inhalation   Inhale 2 puffs into the lungs every 6 (six) hours as needed. 2 puffs every 6 to 8 hours as needed   6.7 g   3   . ALPRAZOLAM 1 MG PO TABS      take 1 tablet by mouth twice a day if needed   60 tablet   1   . AMLODIPINE BESYLATE 2.5 MG PO TABS      take 1 tablet by mouth once daily   30 tablet   3   . BUDESONIDE-FORMOTEROL FUMARATE 80-4.5 MCG/ACT IN AERO   Inhalation   Inhale 2 puffs into  the lungs daily. Inhale one puff daily   1 Inhaler   3   . FLUTICASONE PROPIONATE 50 MCG/ACT NA SUSP   Nasal   2 sprays by Nasal route daily. One to two puffs per nostril daily          . IPRATROPIUM-ALBUTEROL 0.5-2.5 (3) MG/3ML IN SOLN   Nebulization   Take 3 mLs by nebulization as needed.   360 mL   3   . OXYBUTYNIN CHLORIDE ER 10 MG PO TB24   Oral   Take 1 tablet (10 mg total) by mouth daily.   30 tablet   4   . PHENTERMINE HCL 37.5 MG PO TBDP   Oral   Take 1 tablet by mouth daily before breakfast.   30 tablet   1   . PREDNISONE (PAK) 5 MG PO TABS   Oral   Take 1 tablet (5 mg total) by mouth as directed.   21 tablet   0   . VITAMIN D (ERGOCALCIFEROL) 50000 UNITS PO CAPS      take 1 capsule by mouth every week   4 capsule   3     Refill Approved     BP 130/68  Pulse 110  Temp 98.3 F (36.8 C) (Oral)  Resp 16  Ht 5' 3"  (1.6 m)  Wt 202 lb (91.627 kg)  BMI 35.78  kg/m2  SpO2 100%  Physical Exam  Nursing note and vitals reviewed. Constitutional: She is oriented to person, place, and time.       Awake, alert, nontoxic appearance, anxious  HENT:  Head: Normocephalic and atraumatic.  Right Ear: External ear normal.  Left Ear: External ear normal.  Nose: Nose normal.  Mouth/Throat: Oropharynx is clear and moist.  Eyes: Right eye exhibits no discharge. Left eye exhibits no discharge.       Patient has colored contact. Pupils are dilated at 5 mm and reactive  Neck: Neck supple.  Cardiovascular: Normal heart sounds and intact distal pulses.   Pulmonary/Chest: Effort normal and breath sounds normal. She has no wheezes. She exhibits no tenderness.  Abdominal: Soft. There is no tenderness. There is no rebound.  Musculoskeletal: She exhibits no tenderness.       Baseline ROM, no obvious new focal weakness.  Neurological: She is alert and oriented to person, place, and time. She has normal reflexes. No cranial nerve deficit. Coordination normal.       Mental status and motor strength appears baseline for patient and situation.She does not recall the events surrounding her coming to the ER.  Skin: No rash noted.  Psychiatric: She has a normal mood and affect.    ED Course  Procedures (including critical care time)  Results for orders placed during the hospital encounter of 12/12/12  URINALYSIS, ROUTINE W REFLEX MICROSCOPIC      Component Value Range   Color, Urine YELLOW  YELLOW   APPearance CLEAR  CLEAR   Specific Gravity, Urine 1.025  1.005 - 1.030   pH 5.5  5.0 - 8.0   Glucose, UA NEGATIVE  NEGATIVE mg/dL   Hgb urine dipstick SMALL (*) NEGATIVE   Bilirubin Urine NEGATIVE  NEGATIVE   Ketones, ur NEGATIVE  NEGATIVE mg/dL   Protein, ur NEGATIVE  NEGATIVE mg/dL   Urobilinogen, UA 0.2  0.0 - 1.0 mg/dL   Nitrite NEGATIVE  NEGATIVE   Leukocytes, UA TRACE (*) NEGATIVE  CBC      Component Value Range   WBC 9.3  4.0 -  10.5 K/uL   RBC 4.45  3.87 -  5.11 MIL/uL   Hemoglobin 12.6  12.0 - 15.0 g/dL   HCT 38.2  36.0 - 46.0 %   MCV 85.8  78.0 - 100.0 fL   MCH 28.3  26.0 - 34.0 pg   MCHC 33.0  30.0 - 36.0 g/dL   RDW 14.2  11.5 - 15.5 %   Platelets 335  150 - 400 K/uL  BASIC METABOLIC PANEL      Component Value Range   Sodium 139  135 - 145 mEq/L   Potassium 3.7  3.5 - 5.1 mEq/L   Chloride 100  96 - 112 mEq/L   CO2 28  19 - 32 mEq/L   Glucose, Bld 99  70 - 99 mg/dL   BUN 9  6 - 23 mg/dL   Creatinine, Ser 0.76  0.50 - 1.10 mg/dL   Calcium 9.8  8.4 - 10.5 mg/dL   GFR calc non Af Amer >90  >90 mL/min   GFR calc Af Amer >90  >90 mL/min  URINE RAPID DRUG SCREEN (HOSP PERFORMED)      Component Value Range   Opiates NONE DETECTED  NONE DETECTED   Cocaine NONE DETECTED  NONE DETECTED   Benzodiazepines POSITIVE (*) NONE DETECTED   Amphetamines NONE DETECTED  NONE DETECTED   Tetrahydrocannabinol NONE DETECTED  NONE DETECTED   Barbiturates NONE DETECTED  NONE DETECTED  ETHANOL      Component Value Range   Alcohol, Ethyl (B) <11  0 - 11 mg/dL  URINE MICROSCOPIC-ADD ON      Component Value Range   Squamous Epithelial / LPF FEW (*) RARE   WBC, UA 11-20  <3 WBC/hpf   RBC / HPF TOO NUMEROUS TO COUNT  <3 RBC/hpf   Bacteria, UA MANY (*) RARE   Casts HYALINE CASTS (*) NEGATIVE     Date: 12/12/2012  0211  Rate: 110  Rhythm: sinus tachycardia QRS Axis: normal  Intervals: normal  ST/T Wave abnormalities: normal  Conduction Disutrbances: none  Narrative Interpretation: unremarkable  Ct Head Wo Contrast  12/12/2012  *RADIOLOGY REPORT*  Clinical Data: New onset seizures and incontinence.  CT HEAD WITHOUT CONTRAST  Technique:  Contiguous axial images were obtained from the base of the skull through the vertex without contrast.  Comparison: MRI brain 12/20/2007  Findings: The ventricles and sulci are symmetrical without significant effacement, displacement, or dilatation. No mass effect or midline shift. No abnormal extra-axial fluid  collections. The grey-white matter junction is distinct. Basal cisterns are not effaced. No acute intracranial hemorrhage. No depressed skull fractures.  Visualized paranasal sinuses and mastoid air cells are not opacified.  IMPRESSION: No acute intracranial abnormalities.   Original Report Authenticated By: Lucienne Capers, M.D.       MDM  Patient with syncope vs seizure activity. Patient had taken xanax and phentermine prior to going to work. She may have taken oxybutynin. The combination can cause dizziness. There is an anxiety component to her behavior. She is scheduled to see [facility] 12/26/12 for recent urinary incontinence. She has a beginning UTI. Initiated antibiotic therapy. CT without acute findings. Reviewed results with patient. Pt stable in ED with no significant deterioration in condition.The patient appears reasonably screened and/or stabilized for discharge and I doubt any other medical condition or other [facility] requiring further screening, evaluation, or treatment in the ED at this time prior to discharge.  MDM Reviewed: nursing note and vitals Interpretation: labs and CT scan  Gypsy Balsam. Olin Hauser, MD 12/12/12 [phone]

## 2012-12-12 NOTE — Telephone Encounter (Signed)
Called and left message for patient to return call.  

## 2012-12-12 NOTE — ED Notes (Signed)
Remains alert, talking w/family @ bedside

## 2012-12-12 NOTE — ED Notes (Signed)
Pt arrived via ems from work. Pt was sitting in a chair and coworker noticed pt was not responding. ems was called and ems reports pt had seizure 5 min ago. Pt postictal at this time.

## 2012-12-12 NOTE — Telephone Encounter (Signed)
pls also advise medication was not sent to the pharmacy for her for a bladder infection in December, she never had one. Pls advise I am concerned about the seizure she had, check that she has a follow uop appointment with dr Merlene Laughter to look further into this, she sees him regularly  I m available to speak with her if you feel she needs this, get me from a room

## 2012-12-12 NOTE — ED Notes (Signed)
Patient transported to CT 

## 2012-12-13 ENCOUNTER — Encounter: Payer: Self-pay | Admitting: Family Medicine

## 2012-12-13 ENCOUNTER — Ambulatory Visit (INDEPENDENT_AMBULATORY_CARE_PROVIDER_SITE_OTHER): Payer: 59 | Admitting: Family Medicine

## 2012-12-13 ENCOUNTER — Other Ambulatory Visit: Payer: Self-pay | Admitting: Family Medicine

## 2012-12-13 VITALS — BP 120/80 | HR 97 | Resp 16

## 2012-12-13 DIAGNOSIS — I10 Essential (primary) hypertension: Secondary | ICD-10-CM

## 2012-12-13 DIAGNOSIS — J45909 Unspecified asthma, uncomplicated: Secondary | ICD-10-CM

## 2012-12-13 DIAGNOSIS — R569 Unspecified convulsions: Secondary | ICD-10-CM

## 2012-12-13 DIAGNOSIS — R32 Unspecified urinary incontinence: Secondary | ICD-10-CM

## 2012-12-13 DIAGNOSIS — F411 Generalized anxiety disorder: Secondary | ICD-10-CM

## 2012-12-13 DIAGNOSIS — E669 Obesity, unspecified: Secondary | ICD-10-CM

## 2012-12-13 MED ORDER — BUDESONIDE-FORMOTEROL FUMARATE 80-4.5 MCG/ACT IN AERO: 2.0000 | INHALATION_SPRAY | Freq: Every day | RESPIRATORY_TRACT | Status: DC

## 2012-12-13 MED ORDER — VITAMIN D (ERGOCALCIFEROL) 1.25 MG (50000 UNIT) PO CAPS: ORAL_CAPSULE | ORAL | Status: DC

## 2012-12-13 MED ORDER — ALBUTEROL SULFATE HFA 108 (90 BASE) MCG/ACT IN AERS: 2.0000 | INHALATION_SPRAY | Freq: Four times a day (QID) | RESPIRATORY_TRACT | Status: DC | PRN

## 2012-12-13 MED ORDER — ALPRAZOLAM 1 MG PO TABS: ORAL_TABLET | ORAL | Status: DC

## 2012-12-13 NOTE — Patient Instructions (Addendum)
F/U as before  You are referred see  The neurologist and will get an appointment as soon as possible.I have spoken directly to him. You have an appointment for an EEG to be done at Dr. Freddie Apley office on 12/15/2012 at 1:30pm You will get a work note until then. Please get the extension of the work excuse from Dr Freddie Apley office on Friday as I do not know when he will next see you You cannot return to work, and you should not drive until you have been told it is safe to do so by Dr. Merlene Laughter  OK to hold on taking the antibiotic until the culture report is available on the urine, since your symptoms are unchanged, and the December specimen showed no infection  Use suppository for hemorhoids for rectal bleed

## 2012-12-13 NOTE — Telephone Encounter (Signed)
Patient in for office visit.

## 2012-12-14 LAB — URINE CULTURE

## 2012-12-15 ENCOUNTER — Telehealth: Payer: Self-pay | Admitting: Family Medicine

## 2012-12-15 NOTE — Telephone Encounter (Signed)
pls write a work excuse until that date she will need to collect it/we can fax.  I am unable to release her to work , needs neurology to do this

## 2012-12-15 NOTE — Telephone Encounter (Signed)
Patient aware- note extended

## 2012-12-15 NOTE — ED Notes (Signed)
+   Urine Patient treated with Cipro-sensitive to same-chart appended per protocol MD.

## 2012-12-17 ENCOUNTER — Telehealth: Payer: Self-pay | Admitting: Family Medicine

## 2012-12-17 DIAGNOSIS — R569 Unspecified convulsions: Secondary | ICD-10-CM | POA: Insufficient documentation

## 2012-12-17 NOTE — Assessment & Plan Note (Addendum)
Ongoing problem for urological evaluation, uncertain if this is a part of a primarily neurologic disorder Pt to advise to hold cipro until rept c/s available

## 2012-12-17 NOTE — Telephone Encounter (Signed)
Direct contact made with pt and advised her that the urine specimen from the E was positive for infection and that she should take the ciprofloxacin antibiotic that had been prescribed. She verbalized understanding and will fill the script later today

## 2012-12-17 NOTE — Assessment & Plan Note (Signed)
New problem, will need neurologic workup to estab;lish a diagnosis, and/or determine underlying pathology. Out of work until cleared by neurology Direct contact made with neurologist and appt established for later this week

## 2012-12-17 NOTE — Assessment & Plan Note (Signed)
Unchanged. Patient re-educated about  the importance of commitment to a  minimum of 150 minutes of exercise per week. The importance of healthy food choices with portion control discussed. Encouraged to start a food diary, count calories and to consider  joining a support group. Sample diet sheets offered. Goals set by the patient for the next several months.    

## 2012-12-17 NOTE — Assessment & Plan Note (Signed)
Controlled, no change in medication  

## 2012-12-17 NOTE — Progress Notes (Signed)
  Subjective:    Patient ID: Leslie Lyons, female    DOB: 1961/03/12, 52 y.o.   MRN: [id number]  HPI Pt in for Ed follow up for new onset seizure, no previous seizure history, though maintained on xanax, denies missing any doses, and does not abuse the drug. Denies fever or chills.Continues to have severe incontinence, which she is referred to urology about. Pt has had multiple neurologic complaints that hs e has been referred to neurology for in the past, and actually has missed appointments for f/u reports inadequate funds Denies  Dysuria or fever or flank pain or chills States the Ed tolsd her she had a UTI in Dec which she did not collect her meds for, I advised this was not the case. She has again been given a script for cipro, I advise her to hold this pending c/s since her urinary symptoms are not overtly concerning for infection   Review of Systems See HPI Denies recent fever or chills. Denies sinus pressure, nasal congestion, ear pain or sore throat. Denies chest congestion, productive cough or wheezing. Denies chest pains, palpitations and leg swelling Denies abdominal pain, nausea, vomiting,diarrhea or constipation. C/o scant rectal blood with straining earlier this week, has established diagnosis of hemmorhoids  Denies dysuria, frequency, hesitancy . Denies joint pain, swelling and limitation in mobility. Denies headache Denies uncontrolled  depression, anxiety or insomnia. Denies skin break down or rash.        Objective:   Physical Exam Patient alert and oriented and in no cardiopulmonary distress.  HEENT: No facial asymmetry, EOMI, no sinus tenderness,  oropharynx pink and moist.  Neck supple no adenopathy.  Chest: Clear to auscultation bilaterally.  CVS: S1, S2 no murmurs, no S3.  ABD: Soft non tender. Bowel sounds normal.no renal angle or suprapubic tenderness  Ext: No edema  MS: Adequate ROM spine, shoulders, hips and knees.  Skin: Intact, no  ulcerations or rash noted.  Psych: Good eye contact, normal affect. Memory intact not anxious or depressed appearing.  CNS: CN 2-12 intact, power, tone and sensation normal throughout.        Assessment & Plan:

## 2012-12-17 NOTE — Assessment & Plan Note (Signed)
Controlled, no change in medication DASH diet and commitment to daily physical activity for a minimum of 30 minutes discussed and encouraged, as a part of hypertension management. The importance of attaining a healthy weight is also discussed.  

## 2012-12-26 ENCOUNTER — Ambulatory Visit: Payer: 59 | Admitting: Urology

## 2013-01-08 ENCOUNTER — Encounter: Payer: Self-pay | Admitting: Family Medicine

## 2013-01-16 ENCOUNTER — Ambulatory Visit: Payer: 59 | Admitting: Urology

## 2013-01-29 ENCOUNTER — Ambulatory Visit: Payer: 59 | Admitting: Family Medicine

## 2013-02-08 ENCOUNTER — Other Ambulatory Visit: Payer: Self-pay | Admitting: Family Medicine

## 2013-02-13 ENCOUNTER — Ambulatory Visit: Payer: 59 | Admitting: Urology

## 2013-02-23 ENCOUNTER — Ambulatory Visit (INDEPENDENT_AMBULATORY_CARE_PROVIDER_SITE_OTHER): Payer: 59 | Admitting: Family Medicine

## 2013-02-23 ENCOUNTER — Encounter: Payer: Self-pay | Admitting: Family Medicine

## 2013-02-23 VITALS — BP 160/100 | HR 105 | Resp 16

## 2013-02-23 DIAGNOSIS — R569 Unspecified convulsions: Secondary | ICD-10-CM

## 2013-02-23 DIAGNOSIS — E559 Vitamin D deficiency, unspecified: Secondary | ICD-10-CM

## 2013-02-23 DIAGNOSIS — I10 Essential (primary) hypertension: Secondary | ICD-10-CM

## 2013-02-23 DIAGNOSIS — F411 Generalized anxiety disorder: Secondary | ICD-10-CM

## 2013-02-23 DIAGNOSIS — J209 Acute bronchitis, unspecified: Secondary | ICD-10-CM | POA: Insufficient documentation

## 2013-02-23 DIAGNOSIS — R32 Unspecified urinary incontinence: Secondary | ICD-10-CM

## 2013-02-23 DIAGNOSIS — J45909 Unspecified asthma, uncomplicated: Secondary | ICD-10-CM

## 2013-02-23 DIAGNOSIS — R7301 Impaired fasting glucose: Secondary | ICD-10-CM

## 2013-02-23 DIAGNOSIS — E669 Obesity, unspecified: Secondary | ICD-10-CM

## 2013-02-23 DIAGNOSIS — E785 Hyperlipidemia, unspecified: Secondary | ICD-10-CM

## 2013-02-23 DIAGNOSIS — F17200 Nicotine dependence, unspecified, uncomplicated: Secondary | ICD-10-CM

## 2013-02-23 MED ORDER — BENZONATATE 100 MG PO CAPS: 100.0000 mg | ORAL_CAPSULE | Freq: Four times a day (QID) | ORAL | Status: DC | PRN

## 2013-02-23 MED ORDER — PROMETHAZINE-DM 6.25-15 MG/5ML PO SYRP: ORAL_SOLUTION | ORAL | Status: DC

## 2013-02-23 MED ORDER — FLUCONAZOLE 150 MG PO TABS: ORAL_TABLET | ORAL | Status: AC

## 2013-02-23 MED ORDER — SULFAMETHOXAZOLE-TRIMETHOPRIM 800-160 MG PO TABS: 1.0000 | ORAL_TABLET | Freq: Two times a day (BID) | ORAL | Status: AC

## 2013-02-23 NOTE — Patient Instructions (Addendum)
F/U in July 20 or after, with rectal  Fasting lipid, cmp, HBA1C, TSh and vit D mid July  It is important that you exercise regularly at least 30 minutes 5 times a week. If you develop chest pain, have severe difficulty breathing, or feel very tired, stop exercising immediately and seek medical attention   A healthy diet is rich in fruit, vegetables and whole grains. Poultry fish, nuts and beans are a healthy choice for protein rather then red meat. A low sodium diet and drinking 64 ounces of water daily is generally recommended. Oils and sweet should be limited. Carbohydrates especially for those who are diabetic or overweight, should be limited to 34-45 gram per meal. It is important to eat on a regular schedule, at least 3 times daily. Snacks should be primarily fruits, vegetables or nuts.  Please set a quit date, you need to stop smoking to improve your health  Blood pressure is high today, it is VITAL you take your medication EVERY DAY ,a s prescribed  I am sorry about your current stress, and hope it will improve  You are treated for acute bronchitis, 4 medications are sent to your pharmacy

## 2013-02-24 DIAGNOSIS — F17200 Nicotine dependence, unspecified, uncomplicated: Secondary | ICD-10-CM | POA: Insufficient documentation

## 2013-02-24 NOTE — Progress Notes (Signed)
  Subjective:    Patient ID: Leslie Lyons, female    DOB: [birth year], 52 y.o.   MRN: [id number]  HPI The PT is here for follow up and re-evaluation of chronic medical conditions, medication management and review of any available recent lab and radiology data.  Preventive health is updated, specifically  Cancer screening and Immunization.   Questions or concerns regarding consultations or procedures which the PT has had in the interim are  Addressed.Denies any subsequent seizures, sill not driving has 3 month f/u with neurology coming up Has not been to urology, reports increased stress due to near homicide of a niece she raised. Refuses weighing, requests increased xanax and phentermine, none of the latter granted at this visit The PT denies any adverse reactions to current medications since the last visit.  5 day h/o increased chest congestion, green sputum, chills, no documented fever. No increase in wheezing      Review of Systems See HPI  Denies sinus pressure, nasal congestion, ear pain or sore throat.  Denies chest pains, palpitations and leg swelling Denies abdominal pain, nausea, vomiting,diarrhea or constipation.   Denies dysuria, frequency, hesitancy or incontinence. Denies joint pain, swelling and limitation in mobility. Denies headaches, seizures, numbness, or tingling.  Denies skin break down or rash.        Objective:   Physical Exam  Patient alert and oriented and in no cardiopulmonary distress.Anxious  HEENT: No facial asymmetry, EOMI, no sinus tenderness,  oropharynx pink and moist.  Neck supple no adenopathy.  Chest: decreased though adequate air entry, scattered crackles in right lower lung  CVS: S1, S2 no murmurs, no S3.  ABD: Soft non tender. Bowel sounds normal.  Ext: No edema  MS: Adequate ROM spine, shoulders, hips and knees.  Skin: Intact, no ulcerations or rash noted.  Psych: Good eye contact, normal affect. Memory intact anxious not   depressed appearing.  CNS: CN 2-12 intact, power,  normal throughout.       Assessment & Plan:

## 2013-02-24 NOTE — Assessment & Plan Note (Signed)
Unchanged, has to reschedule urology appt

## 2013-02-24 NOTE — Assessment & Plan Note (Signed)
Acute chest congestin wih increased sputum and chills, antibiotics prescribed and decongestant as well as cough suppressant

## 2013-02-24 NOTE — Assessment & Plan Note (Signed)
No recurrent episode to date, has neurology f/u

## 2013-02-24 NOTE — Assessment & Plan Note (Signed)
Uncontrolled today, meds have not been changed recently and generlally has good control. No med change at this time DASH diet and commitment to daily physical activity for a minimum of 30 minutes discussed and encouraged, as a part of hypertension management. The importance of attaining a healthy weight is also discussed.

## 2013-02-24 NOTE — Assessment & Plan Note (Signed)
Hyperlipidemia:Low fat diet discussed and encouraged.  No meds at this time

## 2013-02-24 NOTE — Assessment & Plan Note (Signed)
Currently uncontrolled due to personal stress Pt ventilated, no change in xanax dose as requested

## 2013-02-24 NOTE — Assessment & Plan Note (Signed)
No recent decompensation, continue current meds

## 2013-02-24 NOTE — Assessment & Plan Note (Signed)
Deteriorated, reportedly to deal with increased stress Patient counseled for approximately 5 minutes regarding the health risks of ongoing nicotine use, specifically all types of cancer, heart disease, stroke and respiratory failure. The options available for help with cessation ,the behavioral changes to assist the process, and the option to either gradully reduce usage  Or abruptly stop.is also discussed. Pt is also encouraged to set specific goals in number of cigarettes used daily, as well as to set a quit date.

## 2013-03-11 ENCOUNTER — Other Ambulatory Visit: Payer: Self-pay | Admitting: Family Medicine

## 2013-03-29 ENCOUNTER — Other Ambulatory Visit: Payer: Self-pay | Admitting: Family Medicine

## 2013-04-19 ENCOUNTER — Other Ambulatory Visit: Payer: Self-pay | Admitting: Family Medicine

## 2013-07-03 ENCOUNTER — Other Ambulatory Visit: Payer: Self-pay | Admitting: Family Medicine

## 2013-07-05 LAB — HEMOGLOBIN A1C
Hgb A1c MFr Bld: 6.1 % — ABNORMAL HIGH (ref <5.7)
Mean Plasma Glucose: 128 mg/dL — ABNORMAL HIGH (ref <117)

## 2013-07-05 LAB — COMPREHENSIVE METABOLIC PANEL
Albumin: 4.6 g/dL (ref 3.5–5.2)
Alkaline Phosphatase: 70 U/L (ref 39–117)
BUN: 6 mg/dL (ref 6–23)
CO2: 31 mEq/L (ref 19–32)
Glucose, Bld: 85 mg/dL (ref 70–99)
Total Bilirubin: 0.3 mg/dL (ref 0.3–1.2)
Total Protein: 7.8 g/dL (ref 6.0–8.3)

## 2013-07-05 LAB — LIPID PANEL
Cholesterol: 235 mg/dL — ABNORMAL HIGH (ref 0–200)
HDL: 47 mg/dL (ref >39)
Total CHOL/HDL Ratio: 5 Ratio

## 2013-07-06 ENCOUNTER — Other Ambulatory Visit (HOSPITAL_COMMUNITY)
Admission: RE | Admit: 2013-07-06 | Discharge: 2013-07-06 | Disposition: A | Discharge status: Home / Self Care | Payer: 59 | Source: Ambulatory Visit | Attending: Family Medicine | Admitting: Family Medicine

## 2013-07-06 ENCOUNTER — Encounter: Payer: Self-pay | Admitting: Family Medicine

## 2013-07-06 ENCOUNTER — Ambulatory Visit (INDEPENDENT_AMBULATORY_CARE_PROVIDER_SITE_OTHER): Payer: 59 | Admitting: Family Medicine

## 2013-07-06 VITALS — BP 130/82 | HR 98 | Resp 16 | Wt 216.0 lb

## 2013-07-06 DIAGNOSIS — R519 Headache, unspecified: Secondary | ICD-10-CM | POA: Insufficient documentation

## 2013-07-06 DIAGNOSIS — F411 Generalized anxiety disorder: Secondary | ICD-10-CM

## 2013-07-06 DIAGNOSIS — R29898 Other symptoms and signs involving the musculoskeletal system: Secondary | ICD-10-CM

## 2013-07-06 DIAGNOSIS — N83202 Unspecified ovarian cyst, left side: Secondary | ICD-10-CM | POA: Insufficient documentation

## 2013-07-06 DIAGNOSIS — R51 Headache: Secondary | ICD-10-CM

## 2013-07-06 DIAGNOSIS — Z01419 Encounter for gynecological examination (general) (routine) without abnormal findings: Secondary | ICD-10-CM | POA: Insufficient documentation

## 2013-07-06 DIAGNOSIS — Z8541 Personal history of malignant neoplasm of cervix uteri: Secondary | ICD-10-CM

## 2013-07-06 DIAGNOSIS — E669 Obesity, unspecified: Secondary | ICD-10-CM

## 2013-07-06 DIAGNOSIS — I10 Essential (primary) hypertension: Secondary | ICD-10-CM

## 2013-07-06 DIAGNOSIS — R7303 Prediabetes: Secondary | ICD-10-CM | POA: Insufficient documentation

## 2013-07-06 DIAGNOSIS — R209 Unspecified disturbances of skin sensation: Secondary | ICD-10-CM

## 2013-07-06 DIAGNOSIS — F17200 Nicotine dependence, unspecified, uncomplicated: Secondary | ICD-10-CM

## 2013-07-06 DIAGNOSIS — E785 Hyperlipidemia, unspecified: Secondary | ICD-10-CM

## 2013-07-06 DIAGNOSIS — Z1151 Encounter for screening for human papillomavirus (HPV): Secondary | ICD-10-CM | POA: Insufficient documentation

## 2013-07-06 DIAGNOSIS — R32 Unspecified urinary incontinence: Secondary | ICD-10-CM

## 2013-07-06 DIAGNOSIS — R7309 Other abnormal glucose: Secondary | ICD-10-CM

## 2013-07-06 DIAGNOSIS — N83209 Unspecified ovarian cyst, unspecified side: Secondary | ICD-10-CM

## 2013-07-06 DIAGNOSIS — R200 Anesthesia of skin: Secondary | ICD-10-CM

## 2013-07-06 DIAGNOSIS — H53419 Scotoma involving central area, unspecified eye: Secondary | ICD-10-CM

## 2013-07-06 LAB — TSH: TSH: 1.915 u[IU]/mL (ref 0.350–4.500)

## 2013-07-06 MED ORDER — ALPRAZOLAM 1 MG PO TABS: ORAL_TABLET | ORAL | Status: DC

## 2013-07-06 MED ORDER — PRAVASTATIN SODIUM 20 MG PO TABS: 20.0000 mg | ORAL_TABLET | Freq: Every evening | ORAL | Status: DC

## 2013-07-06 MED ORDER — METFORMIN HCL 500 MG PO TABS: 500.0000 mg | ORAL_TABLET | Freq: Two times a day (BID) | ORAL | Status: DC

## 2013-07-06 MED ORDER — AMLODIPINE BESYLATE 2.5 MG PO TABS: ORAL_TABLET | ORAL | Status: DC

## 2013-07-06 NOTE — Patient Instructions (Addendum)
F/u in 5 weeks, callif you need me before  Blood pressure is excellent and thyroid function  Is normal  You are referred for pelvic ultrasound to f/u the cyst on your ovary  You are referred for a brain scan because of headaches, intermittent loss of vision and weakness in the legs   Your cholesterol is high, so please cut back on fried and fatty foods. New is pravachol 1 at bedtime to help to lower your cholesterol.  Blood sugar is creeping up, continue to keep sugar intake down, eat a lot of vegetable. New is metformin one twice per day to help to improve your blood sugar. It may also help with weight loss  PLEASE work on stopping smoking, this will improve your health

## 2013-07-06 NOTE — Progress Notes (Signed)
  Subjective:    Patient ID: Leslie Lyons, female    DOB: July 04, 1961, 52 y.o.   MRN: [id number]  HPI The PT is here for follow up and re-evaluation of chronic medical conditions, medication management and review of any available recent lab and radiology data.  Preventive health is updated, specifically  Cancer screening and Immunization.   Has still not made appt for incontinence, states she is afraid of what she might hear, was dx with cervical ca at 18 reportedly. Thought she had total pelvic clearance, now has left ovarian cyst which needs f/u C/o ongoing headache with intermittent giving out of her feet, co workers catch her, also intermittent loss of vision, will order brain scan Concerned about continuing weight gain , and unable to exercise as she would like to Still smoking, unwilling to set a quit date at this time, counselled      Review of Systems See HPI Denies recent fever or chills. Denies sinus pressure, nasal congestion, ear pain or sore throat. Denies chest congestion, productive cough or wheezing. Denies chest pains, palpitations and leg swelling Denies abdominal pain, nausea, vomiting,diarrhea or constipation.   Denies dysuria, frequency, hesitancy  Denies joint pain, swelling and limitation in mobility.  Denies depression has increased  anxiety due to poor health, also mild insomnia. Denies skin break down or rash.        Objective:   Physical Exam  Patient alert and oriented and in no cardiopulmonary distress.Anxious  HEENT: No facial asymmetry, EOMI, no sinus tenderness,  oropharynx pink and moist.  Neck supple no adenopathy.  Chest: Clear to auscultation bilaterally.  CVS: S1, S2 no murmurs, no S3.  ABD: Soft non tender. Bowel sounds normal. Pel;vic: vaginal walls normal, physiologic d/c, no ulcers, no palpable adnexal mass, pap semt, uterus absent. Ext: No edema  MS: decreased ROM spine,adequate in  shoulders, hips and knees.  Skin: Intact,  no ulcerations or rash noted.  Psych: Good eye contact, normal affect. Memory intact  anxious and mildly depressed appearing.  CNS: CN 2-12 intact, power and  tone  normal throughout.       Assessment & Plan:

## 2013-07-08 NOTE — Assessment & Plan Note (Addendum)
interemittent numbness and weakness in feet, with "giving out" has abn nerve study, will order brain MRI to r/o central cause, also describes intermittent vision loss and headache

## 2013-07-08 NOTE — Assessment & Plan Note (Signed)
Unchanged, reports "fear ' of seeing urologist, encouraged to follow through with appt, will continue to work on this. H/o cervical ca as a teen has her scared, explained impt of f/u

## 2013-07-08 NOTE — Assessment & Plan Note (Signed)
Uncontrolled, has not been taking med prescribed, because she was "afraid", will start and also reduce fat in diet

## 2013-07-08 NOTE — Assessment & Plan Note (Signed)
Patient educated about the importance of limiting  Carbohydrate intake , the need to commit to daily physical activity for a minimum of 30 minutes , and to commit weight loss. The fact that changes in all these areas will reduce or eliminate all together the development of diabetes is stressed.   Pt to start metformin

## 2013-07-08 NOTE — Assessment & Plan Note (Signed)
Unchanged, needs to quit. Patient counseled for approximately 5 minutes regarding the health risks of ongoing nicotine use, specifically all types of cancer, heart disease, stroke and respiratory failure. The options available for help with cessation ,the behavioral changes to assist the process, and the option to either gradully reduce usage  Or abruptly stop.is also discussed. Pt is also encouraged to set specific goals in number of cigarettes used daily, as well as to set a quit date.

## 2013-07-08 NOTE — Assessment & Plan Note (Signed)
Deteriorated. Patient re-educated about  the importance of commitment to a  minimum of 150 minutes of exercise per week. The importance of healthy food choices with portion control discussed. Encouraged to start a food diary, count calories and to consider  joining a support group. Sample diet sheets offered. Goals set by the patient for the next several months.    

## 2013-07-08 NOTE — Assessment & Plan Note (Signed)
H/o cervical ca as a teen, will also send pap and rept [place]

## 2013-07-08 NOTE — Assessment & Plan Note (Signed)
Controlled, no change in medication DASH diet and commitment to daily physical activity for a minimum of 30 minutes discussed and encouraged, as a part of hypertension management. The importance of attaining a healthy weight is also discussed.  

## 2013-07-08 NOTE — Assessment & Plan Note (Signed)
Fair control, pt has a lot of health issues which are worrying her , we will work through them

## 2013-07-11 ENCOUNTER — Ambulatory Visit (HOSPITAL_COMMUNITY)
Admission: RE | Admit: 2013-07-11 | Discharge: 2013-07-11 | Disposition: A | Discharge status: Home / Self Care | Payer: 59 | Source: Ambulatory Visit | Attending: Family Medicine | Admitting: Family Medicine

## 2013-07-11 ENCOUNTER — Telehealth: Payer: Self-pay | Admitting: Family Medicine

## 2013-07-11 ENCOUNTER — Encounter (HOSPITAL_COMMUNITY): Payer: Self-pay

## 2013-07-11 DIAGNOSIS — R29898 Other symptoms and signs involving the musculoskeletal system: Secondary | ICD-10-CM

## 2013-07-11 DIAGNOSIS — R519 Headache, unspecified: Secondary | ICD-10-CM

## 2013-07-11 DIAGNOSIS — H53419 Scotoma involving central area, unspecified eye: Secondary | ICD-10-CM

## 2013-07-11 DIAGNOSIS — H543 Unqualified visual loss, both eyes: Secondary | ICD-10-CM | POA: Insufficient documentation

## 2013-07-11 DIAGNOSIS — R51 Headache: Secondary | ICD-10-CM | POA: Insufficient documentation

## 2013-07-11 NOTE — Telephone Encounter (Signed)
Is this ok?

## 2013-07-11 NOTE — Telephone Encounter (Signed)
This sounds like a release of medical records to me . Please let me know your thouights so we can proceed

## 2013-07-12 NOTE — Telephone Encounter (Signed)
Patient aware that she needs to come by and sign release.

## 2013-07-12 NOTE — Telephone Encounter (Signed)
Noted thanks °

## 2013-07-12 NOTE — Telephone Encounter (Signed)
I agree.  Will contact patient to have her to sign release.

## 2013-07-12 NOTE — Telephone Encounter (Signed)
Noted, thanks!

## 2013-07-13 ENCOUNTER — Ambulatory Visit (HOSPITAL_COMMUNITY): Payer: 59

## 2013-07-19 ENCOUNTER — Other Ambulatory Visit: Payer: Self-pay | Admitting: Family Medicine

## 2013-07-19 ENCOUNTER — Ambulatory Visit (HOSPITAL_COMMUNITY): Payer: 59

## 2013-07-19 ENCOUNTER — Ambulatory Visit (HOSPITAL_COMMUNITY)
Admission: RE | Admit: 2013-07-19 | Discharge: 2013-07-19 | Disposition: A | Discharge status: Home / Self Care | Payer: 59 | Source: Ambulatory Visit | Attending: Family Medicine | Admitting: Family Medicine

## 2013-07-19 ENCOUNTER — Telehealth: Payer: Self-pay

## 2013-07-19 DIAGNOSIS — N83209 Unspecified ovarian cyst, unspecified side: Secondary | ICD-10-CM

## 2013-07-19 DIAGNOSIS — N83202 Unspecified ovarian cyst, left side: Secondary | ICD-10-CM

## 2013-07-19 DIAGNOSIS — Z90710 Acquired absence of both cervix and uterus: Secondary | ICD-10-CM | POA: Insufficient documentation

## 2013-07-19 DIAGNOSIS — Z8543 Personal history of malignant neoplasm of ovary: Secondary | ICD-10-CM

## 2013-07-19 NOTE — Telephone Encounter (Signed)
Lab ordered and patient already aware to have lab drawn

## 2013-07-31 ENCOUNTER — Ambulatory Visit (INDEPENDENT_AMBULATORY_CARE_PROVIDER_SITE_OTHER): Payer: 59 | Admitting: Obstetrics and Gynecology

## 2013-07-31 ENCOUNTER — Encounter: Payer: Self-pay | Admitting: Obstetrics and Gynecology

## 2013-07-31 DIAGNOSIS — N898 Other specified noninflammatory disorders of vagina: Secondary | ICD-10-CM

## 2013-07-31 DIAGNOSIS — Z113 Encounter for screening for infections with a predominantly sexual mode of transmission: Secondary | ICD-10-CM

## 2013-07-31 LAB — POCT WET PREP (WET MOUNT): Bacteria Wet Prep HPF POC: NEGATIVE

## 2013-07-31 NOTE — Patient Instructions (Signed)
We will check a cancer marker, Ca--125 today, results take 4-7 days. The ovary has remained the same for 18 months , so the likelihood of cancer is very low. Followup will be to see if there is much pain that relates to the cyst.

## 2013-07-31 NOTE — Progress Notes (Signed)
Patient ID: Leslie Lyons, female   DOB: January 01, 1961, 52 y.o.   MRN: [id number] Pt here as a referral from Dr. Moshe Cipro for ovarian cyst. Pt has a history of ovarian cancer.   [facility] Visit  Patient name: Leslie Lyons MRN [id number]  Date of birth: 11/21/1961  CC & HPI:  Leslie Lyons is a 52 y.o. female presenting today for followup of question of cyst on left ovary area, since 2013 stable. No history of chemotherapy, no notes available to confirm hx.  Interestingly problem list indicates a history of cervical cancer, which pt history doesn't sound consistent with. Referral Dr Moshe Cipro.  ROS:  52 yr female who reports having had ovary removed as teen, allegedly for cancer, by Dr Hassell Done here at [facility] in 1980's, with subsequent Abdominal Hysterectomy Dr Tamala Julian for fibroids thru midline incision. (size of melon).   Pertinent History Reviewed:  Medical & Surgical Hx:  Reviewed: Significant for asthma, headaches, hyperlipidemia, obesity Medications: Reviewed & Updated - see associated section Social History: Reviewed -  reports that she has been smoking Cigarettes.  She has been smoking about 0.25 packs per day. She does not have any smokeless tobacco history on file.  Objective Findings:  Vitals: There were no vitals taken for this visit.  Physical Examination: General appearance - alert, well appearing, and in no distress Mental status - alert, oriented to person, place, and time, normal mood, behavior, speech, dress, motor activity, and thought processes Abdomen - soft, nontender, nondistended, no masses or organomegaly Pelvic, normal efg, normal vag cuff no adnexal fullness or tenderness U/s:EXAM:  TRANSABDOMINAL AND TRANSVAGINAL ULTRASOUND OF PELVIS  TECHNIQUE:  Both transabdominal and transvaginal ultrasound examinations of the  pelvis were performed. Transabdominal technique was performed for  global imaging of the pelvis including uterus, ovaries, adnexal   regions, and pelvic cul-de-sac. It was necessary to proceed with  endovaginal exam following the transabdominal exam to visualize the  left ovary.  COMPARISON: 12/30/2011.  FINDINGS:  Uterus: Prior hysterectomy.  Endometrium: n/a  Right ovary: Not visualized. Patient reports prior oophorectomy. No  adnexal mass.  Left ovary: 2.7 x 1.9 x 1.7 cm. Again noted adjacent to the left  ovary is a cystic structure which measures 1.8 x 1.7 x 1.1 cm. This  compares with 1.9 x 1.9 x 0.9 cm previously. I suspect this  represents a benign parovarian cyst and is unchanged.  Other findings: No free fluid  IMPRESSION:  Stable benign appearing cystic structure in the left adnexa, likely  paraovarian cyst.  Electronically Signed  By: Rolm Baptise  On: 07/19/2013 09:24    Assessment & Plan:   Benign appearing stable cyst  Left adnexa Confusing Gyn history unable to confirm at present  Plan  chk ca-125 Return 1 month to discuss prior history further, review labs.

## 2013-08-01 ENCOUNTER — Other Ambulatory Visit: Payer: 59

## 2013-08-01 ENCOUNTER — Other Ambulatory Visit: Payer: Self-pay | Admitting: Family Medicine

## 2013-08-01 DIAGNOSIS — Z8541 Personal history of malignant neoplasm of cervix uteri: Secondary | ICD-10-CM

## 2013-08-01 LAB — GC/CHLAMYDIA PROBE AMP: CT Probe RNA: NEGATIVE

## 2013-08-02 ENCOUNTER — Telehealth: Payer: Self-pay | Admitting: Adult Health

## 2013-08-02 LAB — CA 125: CA 125: 4 U/mL (ref 0.0–30.2)

## 2013-08-02 NOTE — Telephone Encounter (Signed)
No answer

## 2013-08-09 ENCOUNTER — Ambulatory Visit (INDEPENDENT_AMBULATORY_CARE_PROVIDER_SITE_OTHER): Payer: 59 | Admitting: Family Medicine

## 2013-08-09 ENCOUNTER — Encounter: Payer: Self-pay | Admitting: Family Medicine

## 2013-08-09 VITALS — BP 144/92 | HR 104 | Resp 16 | Ht 63.0 in | Wt 217.4 lb

## 2013-08-09 DIAGNOSIS — Z23 Encounter for immunization: Secondary | ICD-10-CM

## 2013-08-09 DIAGNOSIS — I10 Essential (primary) hypertension: Secondary | ICD-10-CM

## 2013-08-09 DIAGNOSIS — E785 Hyperlipidemia, unspecified: Secondary | ICD-10-CM

## 2013-08-09 DIAGNOSIS — F411 Generalized anxiety disorder: Secondary | ICD-10-CM

## 2013-08-09 DIAGNOSIS — J45909 Unspecified asthma, uncomplicated: Secondary | ICD-10-CM

## 2013-08-09 DIAGNOSIS — R7303 Prediabetes: Secondary | ICD-10-CM

## 2013-08-09 DIAGNOSIS — R7309 Other abnormal glucose: Secondary | ICD-10-CM

## 2013-08-09 DIAGNOSIS — R7301 Impaired fasting glucose: Secondary | ICD-10-CM

## 2013-08-09 DIAGNOSIS — E669 Obesity, unspecified: Secondary | ICD-10-CM

## 2013-08-09 MED ORDER — PHENTERMINE HCL 37.5 MG PO TABS: 37.5000 mg | ORAL_TABLET | Freq: Every day | ORAL | Status: DC

## 2013-08-09 MED ORDER — ALPRAZOLAM 1 MG PO TABS: ORAL_TABLET | ORAL | Status: DC

## 2013-08-09 MED ORDER — PRAVASTATIN SODIUM 20 MG PO TABS: 20.0000 mg | ORAL_TABLET | Freq: Every evening | ORAL | Status: DC

## 2013-08-09 MED ORDER — METFORMIN HCL 500 MG PO TABS: 500.0000 mg | ORAL_TABLET | Freq: Two times a day (BID) | ORAL | Status: DC

## 2013-08-09 NOTE — Progress Notes (Signed)
  Subjective:    Patient ID: Leslie Lyons, female    DOB: [birth year], 52 y.o.   MRN: [id number]  HPI The PT is here for follow up and re-evaluation of chronic medical conditions, medication management and review of any available recent lab and radiology data.  Preventive health is updated, specifically  Cancer screening and Immunization.   . The PT denies any adverse reactions to current medications since the last visit.  C/o inability to lose weight which really bothers her a lot, wants to resume phentermine, which has  Helped in the past, and will also see nutritionist. Ability to exercise is somewhat limited Has been evalaute       Review of Systems See HPI Denies recent fever or chills. Denies sinus pressure, nasal congestion, ear pain or sore throat. Denies chest congestion, productive cough or wheezing. Denies chest pains, palpitations and leg swelling Denies abdominal pain, nausea, vomiting,diarrhea or constipation.   Denies dysuria, frequency, hesitancy or incontinence. Denies joint pain, swelling and limitation in mobility. Denies headaches, seizures, numbness, or tingling. Denies uncontrolled  depression, still has anxiety or insomnia. Denies skin break down or rash.        Objective:   Physical Exam Patient alert and oriented and in no cardiopulmonary distress.  HEENT: No facial asymmetry, EOMI, no sinus tenderness,  oropharynx pink and moist.  Neck supple no adenopathy.  Chest: Clear to auscultation bilaterally.  CVS: S1, S2 no murmurs, no S3.  ABD: Soft non tender. Bowel sounds normal.  Ext: No edema  MS: Adequate ROM spine, shoulders, hips and knees.  Skin: Intact, no ulcerations or rash noted.  Psych: Good eye contact, normal affect. Memory intact  anxious or depressed appearing.  CNS: CN 2-12 intact, power, tone and sensation normal throughout.        Assessment & Plan:

## 2013-08-09 NOTE — Patient Instructions (Addendum)
F/u in 3 month, call if you need me before pls  NEW medications, collect and start TODAY please, pravachol for cholesterol, metformin for blood sugar and phentermine for help with weight loss  Blood pressure slightly high today, no medication change now, with weight loss this will improve  Weight loss goal of 3 pounds per month  Fasting lipid, cmp and hBA1c in 3 months, BEFORE visit please

## 2013-08-24 ENCOUNTER — Telehealth: Payer: Self-pay | Admitting: Family Medicine

## 2013-08-24 NOTE — Telephone Encounter (Signed)
Patient aware, will refer her to the nutritionist

## 2013-08-27 ENCOUNTER — Telehealth (HOSPITAL_COMMUNITY): Payer: Self-pay | Admitting: Dietician

## 2013-08-27 NOTE — Telephone Encounter (Signed)
Called at [place]. Line with static and call was disconnected.

## 2013-08-27 NOTE — Telephone Encounter (Signed)
Noted  

## 2013-08-27 NOTE — Telephone Encounter (Signed)
Received call from pt at 0941. Scheduled for 09/05/13 at 0900.

## 2013-08-27 NOTE — Telephone Encounter (Signed)
Received referral via fax from Dr. Moshe Cipro for dx: prediabetes.

## 2013-09-05 ENCOUNTER — Encounter (HOSPITAL_COMMUNITY): Payer: Self-pay | Admitting: Dietician

## 2013-09-05 NOTE — Progress Notes (Addendum)
Outpatient Initial Nutrition Assessment  Date:09/05/2013   Appt Start Time: 0857  Referring Physician: Dr. Moshe Cipro Reason for Visit: obesity, prediabetes  Nutrition Assessment:  Height: 5' 2"  (157.5 cm)   Weight: 211 lb (95.709 kg)   IBW: 110# %IBW: 192% UBW: 150# %UBW: 183% Body mass index is 38.58 kg/(m^2). Meets criteria for obesity, class II. Goal Weight: 190# (10% wt loss of current wt) Weight hx: Pt reports wt of 150# 1-2 years ago. She reports her lowest weight was 115# 30-40 years ago, after she had her sons.   Estimated nutritional needs:  Kcals/ day: 1500-1600 Protein (grams)/day: 77-96 Fluid (L)/ day: 1.5-1.6  PMH:  Past Medical History  Diagnosis Date  . Hypertension   . Anxiety   . Depression   . Bronchial asthma     with acute exacerbation  . Headache(784.0)     with reduced vision in right eye for one month   . Cancer 1985    cervical  . Obesity     Medications:  Current Outpatient Rx  Name  Route  Sig  Dispense  Refill  . albuterol (PROAIR HFA) 108 (90 BASE) MCG/ACT inhaler   Inhalation   Inhale 2 puffs into the lungs every 6 (six) hours as needed. 2 puffs every 6 to 8 hours as needed   6.7 g   4   . ALPRAZolam (XANAX) 1 MG tablet      take 1 tablet by mouth twice a day if needed   60 tablet   3   . amLODipine (NORVASC) 2.5 MG tablet      take 1 tablet by mouth once daily   30 tablet   3   . budesonide-formoterol (SYMBICORT) 80-4.5 MCG/ACT inhaler   Inhalation   Inhale 2 puffs into the lungs daily. Inhale one puff daily   1 Inhaler   4   . fluticasone (FLONASE) 50 MCG/ACT nasal spray      instill 1 to 2 sprays into each nostril once daily   16 g   3   . ipratropium-albuterol (DUONEB) 0.5-2.5 (3) MG/3ML SOLN   Nebulization   Take 3 mLs by nebulization as needed.   360 mL   3   . metFORMIN (GLUCOPHAGE) 500 MG tablet   Oral   Take 1 tablet (500 mg total) by mouth 2 (two) times daily with a meal.   60 tablet   4   . nabumetone  (RELAFEN) 750 MG tablet   Oral   Take 750 mg by mouth 2 (two) times daily.         Marland Kitchen oxybutynin (DITROPAN-XL) 10 MG 24 hr tablet   Oral   Take 1 tablet (10 mg total) by mouth daily.   30 tablet   4   . phentermine (ADIPEX-P) 37.5 MG tablet   Oral   Take 1 tablet (37.5 mg total) by mouth daily before breakfast.   30 tablet   2   . pravastatin (PRAVACHOL) 20 MG tablet   Oral   Take 1 tablet (20 mg total) by mouth every evening.   30 tablet   4   . Vitamin D, Ergocalciferol, (DRISDOL) 50000 UNITS CAPS      take 1 capsule by mouth every week   4 capsule   4     Refill Approved     Labs: CMP     Component Value Date/Time   NA 140 07/05/2013 0725   K 3.8 07/05/2013 0725   CL 100 07/05/2013  0725   CO2 31 07/05/2013 0725   GLUCOSE 85 07/05/2013 0725   BUN 6 07/05/2013 0725   CREATININE 0.83 07/05/2013 0725   CREATININE 0.76 12/12/2012 0224   CALCIUM 9.6 07/05/2013 0725   PROT 7.8 07/05/2013 0725   ALBUMIN 4.6 07/05/2013 0725   AST 27 07/05/2013 0725   ALT 31 07/05/2013 0725   ALKPHOS 70 07/05/2013 0725   BILITOT 0.3 07/05/2013 0725   GFRNONAA >90 12/12/2012 0224   GFRAA >90 12/12/2012 0224    Lipid Panel     Component Value Date/Time   CHOL 235* 07/05/2013 0725   TRIG 175* 07/05/2013 0725   HDL 47 07/05/2013 0725   CHOLHDL 5.0 07/05/2013 0725   VLDL 35 07/05/2013 0725   LDLCALC 153* 07/05/2013 0725     Lab Results  Component Value Date   HGBA1C 6.1* 07/05/2013   HGBA1C 5.9* 12/22/2011   HGBA1C 6.0* 06/25/2011   Lab Results  Component Value Date   LDLCALC 153* 07/05/2013   CREATININE 0.83 07/05/2013     Lifestyle/ social habits: Ms. Pluta is a very pleasant lady who resides in [place] with her workers, Sam (who is present today), and her husband of 99 years. She has 2 adult sons in her 15's and 63's who no longer live in the area as well as 6 grandchildren. She works 3rd shift at [employer], a job she has had for 11 years. She reports that "everything" stresses her out and rates her stress level  as an 11/10. SHe reports she is no longer physically active due to medical problems involving her knees, nerve damage, and spine. She reports that she was very active until 1-2 years ago, when these problems started. Prior to her health issues, she reported that she would walk several miles a day and hula hoop.   Nutrition hx/habits: Ms. Freundlich reports "I don't understand why I gain weight when I don't eat". She also expresses concern about developing diabetes. She reports she was recently put on Metformin and her blood sugars have been elevated (noted last 3 Hgb A1c readings have trended up). She reports she is now motivated to make changes, as she is terrified to developing diabetes and "needles and stuff scare me". She reports that she was referred here in the past, but reveals "Dr. Moshe Cipro told me about a diabetes class and I just went home, went to sleep, and didn't think about it".  She reports that she has eliminated fried foods in her diet. She has increased her fruit and cheese intake. She reports that she has solicited advice from friends as well as the internet, and is extremely confused about what to eat to manage her blood sugars. She typically eats once meal per day. She states she has a poor appetite and literally forces herself to eat.   Diet recall: 2:00 AM: coke with bag of peanuts; 7:00 AM: fried fish sandwich OR wheaties cereal with skim milk  Nutrition Diagnosis: Inconsistent carbohydrate intake r/t skipping meals AEB diet recall, Hgb A1c: 6.1.  Nutrition Intervention: Nutrition rx: 1200 kcal NAS, no sugar added diet; 3 meals per day; low calorie beverages only; chopped or ground meats; cooked or soft fruits and vegetables; physical activity as tolerated  Education/Counseling Provided: Educated pt on principles of diabetic diet. Discussed carbohydrate metabolism in relation to diabetes. Educated pt on, goals for Hgb A1c and importance of keeping PCP appointments for continued  follow-up. Educated pt on plate method, portion sizes, and sources of carbohydrate. Discussed  importance of regular meal pattern; had long discussion with pt about importance of not skipping meals to improve glycemic control and how skipping meals is not the answer to long term weight loss. Discussed importance of adding sources of whole grains to diet to improve glycemic control. Also encouraged to choose low fat dairy, lean meats, and whole fruits and vegetables more often. Discussed options of artificial sweeteners and encouraged pt to use which brand she liked best. Reviewed diet recall and discussed nutritional content of foods commonly eaten and discussed healthier alternatives. Discussed importance of compliance to prolong diangosis of disease. Educated pt on importance of physical activity (goal of at least 30 minutes 5 times per week) along with a healthy diet to achieve weight loss and glycemic goals. Discussed appropriate goals for physical activity, as well as exercises she felt comfortable doing, encouraging her to work up toward goals. Encouraged slow, moderate weight loss of 1-2# per week, or 7-10% of current body weight. Provided "Diabetes and You" and "Carb Counting and Meal Planning" handouts. Used TeachBack to assess understanding.   Understanding, Motivation, Ability to Follow Recommendations: Expect fair compliance.   Monitoring and Evaluation: Goals: 1) 0.5-2# wt loss per week; 2) Physical activity as tolerated; 3) 3 meals per day; 4) Hgb A1c < 6.0  Recommendations: 1) For weight loss: 1200-1300 kcals daily; 2) Break physical activity up into smaller, more frequent sessions; 3) Use crock pot to prepare more tender meats or cut meats into smaller pieces and add gravies or sauces for ease of intake; 4) Choose cooked vegetables and canned fruits in light syrup for ease of intake; 5) Pt may benefit from physical therapy due to knee, spine, and back issues described above  F/U: PRN. Pt did  not desire follow-up, but very appreciative of educational session.  Zephyr Sausedo A. Jimmye Norman, RD, LDN 09/05/2013  Appt EndTime: 7373

## 2013-09-17 NOTE — Assessment & Plan Note (Signed)
Deteriorated with further increase in HBA1C,. Start metformin and work on lifesftyle change for weight loss

## 2013-09-17 NOTE — Assessment & Plan Note (Signed)
No change in medciation, though more anxious due to weight gain/failure to lose weight

## 2013-09-17 NOTE — Assessment & Plan Note (Signed)
Uncontrolled Med compliance and dietary compliance stressed

## 2013-09-17 NOTE — Assessment & Plan Note (Signed)
Deteriorated. Patient re-educated about  the importance of commitment to a  minimum of 150 minutes of exercise per week. The importance of healthy food choices with portion control discussed. Encouraged to start a food diary, count calories and to consider  joining a support group. Sample diet sheets offered. Goals set by the patient for the next several months. Pt to resume phentermine and see nutrtionist

## 2013-09-17 NOTE — Assessment & Plan Note (Signed)
Uncontrolled at this visit DASH diet and commitment to daily physical activity for a minimum of 30 minutes discussed and encouraged, as a part of hypertension management. The importance of attaining a healthy weight is also discussed. No med change at this time

## 2013-09-17 NOTE — Assessment & Plan Note (Signed)
Controlled, no change in medication  

## 2013-11-02 ENCOUNTER — Other Ambulatory Visit: Payer: Self-pay | Admitting: Family Medicine

## 2013-11-30 ENCOUNTER — Ambulatory Visit: Payer: 59 | Admitting: Family Medicine

## 2013-12-03 ENCOUNTER — Other Ambulatory Visit: Payer: Self-pay | Admitting: Family Medicine

## 2013-12-14 DIAGNOSIS — Z0279 Encounter for issue of other medical certificate: Secondary | ICD-10-CM

## 2014-01-03 ENCOUNTER — Other Ambulatory Visit: Payer: Self-pay | Admitting: Family Medicine

## 2014-01-14 ENCOUNTER — Encounter: Payer: Self-pay | Admitting: Family Medicine

## 2014-01-14 ENCOUNTER — Encounter (INDEPENDENT_AMBULATORY_CARE_PROVIDER_SITE_OTHER): Payer: Self-pay

## 2014-01-14 ENCOUNTER — Ambulatory Visit (INDEPENDENT_AMBULATORY_CARE_PROVIDER_SITE_OTHER): Payer: 59 | Admitting: Family Medicine

## 2014-01-14 VITALS — BP 124/74 | HR 99 | Resp 16 | Wt 213.0 lb

## 2014-01-14 DIAGNOSIS — R5381 Other malaise: Secondary | ICD-10-CM

## 2014-01-14 DIAGNOSIS — R7303 Prediabetes: Secondary | ICD-10-CM

## 2014-01-14 DIAGNOSIS — E785 Hyperlipidemia, unspecified: Secondary | ICD-10-CM

## 2014-01-14 DIAGNOSIS — F17200 Nicotine dependence, unspecified, uncomplicated: Secondary | ICD-10-CM

## 2014-01-14 DIAGNOSIS — J45909 Unspecified asthma, uncomplicated: Secondary | ICD-10-CM

## 2014-01-14 DIAGNOSIS — K5289 Other specified noninfective gastroenteritis and colitis: Secondary | ICD-10-CM

## 2014-01-14 DIAGNOSIS — E669 Obesity, unspecified: Secondary | ICD-10-CM

## 2014-01-14 DIAGNOSIS — K529 Noninfective gastroenteritis and colitis, unspecified: Secondary | ICD-10-CM

## 2014-01-14 DIAGNOSIS — M549 Dorsalgia, unspecified: Secondary | ICD-10-CM

## 2014-01-14 DIAGNOSIS — R5383 Other fatigue: Secondary | ICD-10-CM

## 2014-01-14 DIAGNOSIS — G8929 Other chronic pain: Secondary | ICD-10-CM

## 2014-01-14 DIAGNOSIS — I10 Essential (primary) hypertension: Secondary | ICD-10-CM

## 2014-01-14 DIAGNOSIS — R7309 Other abnormal glucose: Secondary | ICD-10-CM

## 2014-01-14 MED ORDER — PREDNISONE 5 MG PO TABS: 5.0000 mg | ORAL_TABLET | Freq: Two times a day (BID) | ORAL | Status: AC

## 2014-01-14 MED ORDER — PHENTERMINE HCL 37.5 MG PO TABS: ORAL_TABLET | ORAL | Status: DC

## 2014-01-14 MED ORDER — ALPRAZOLAM 1 MG PO TABS: ORAL_TABLET | ORAL | Status: DC

## 2014-01-14 MED ORDER — KETOROLAC TROMETHAMINE 60 MG/2ML IM SOLN
60.0000 mg | Freq: Once | INTRAMUSCULAR | Status: AC
  Administered 2014-01-14: 60 mg via INTRAMUSCULAR

## 2014-01-14 MED ORDER — METHYLPREDNISOLONE ACETATE 80 MG/ML IJ SUSP
80.0000 mg | Freq: Once | INTRAMUSCULAR | Status: AC
  Administered 2014-01-14: 80 mg via INTRAMUSCULAR

## 2014-01-14 MED ORDER — IPRATROPIUM-ALBUTEROL 0.5-2.5 (3) MG/3ML IN SOLN: 3.0000 mL | RESPIRATORY_TRACT | Status: DC | PRN

## 2014-01-14 NOTE — Assessment & Plan Note (Addendum)
6 week h/o back pain radiaiting down right posterior thigh down to the toes Short sharp anti inflammatory course

## 2014-01-14 NOTE — Assessment & Plan Note (Signed)
Controlled, no recent flares

## 2014-01-14 NOTE — Patient Instructions (Addendum)
F/U in 4 months, call if you need me before Toradol 63m IM and depo medrol 860mIm for back pain and medication sent in also  It is important that you exercise regularly at least 30 minutes 5 times a week. If you develop chest pain, have severe difficulty breathing, or feel very tired, stop exercising immediately and seek medical attention    A healthy diet is rich in fruit, vegetables and whole grains. Poultry fish, nuts and beans are a healthy choice for protein rather then red meat. A low sodium diet and drinking 64 ounces of water daily is generally recommended. Oils and sweet should be limited. Carbohydrates especially for those who are diabetic or overweight, should be limited to 45 to 60 gram per meal. It is important to eat on a regular schedule, at least 3 times daily. Snacks should be primarily fruits, vegetables or nuts.  Adequate rest, generally 6 to 8 hours per night is important for good health.Good sleep hygiene involves setting a regular bedtime, and turning off all sound and light in your sleep environment.Limiting caffeine intake will also help with the ability to rest well  Lab work needs to be done in 3 weeks please.HBA1C, lipid, cmp  All medications need to be brought to every visit  You may have a mild stomach virus, follow the BRAT diet as long as your stomach feels upset. Exam is normal at this time  Diet for Diarrhea, Adult Frequent, runny stools (diarrhea) may be caused or worsened by food or drink. Diarrhea may be relieved by changing your diet. Since diarrhea can last up to 7 days, it is easy for you to lose too much fluid from the body and become dehydrated. Fluids that are lost need to be replaced. Along with a modified diet, make sure you drink enough fluids to keep your urine clear or pale yellow. DIET INSTRUCTIONS  Ensure adequate fluid intake (hydration): have 1 cup (8 oz) of fluid for each diarrhea episode. Avoid fluids that contain simple sugars or sports  drinks, fruit juices, whole milk products, and sodas. Your urine should be clear or pale yellow if you are drinking enough fluids. Hydrate with an oral rehydration solution that you can purchase at pharmacies, retail stores, and online. You can prepare an oral rehydration solution at home by mixing the following ingredients together:    tsp table salt.   tsp baking soda.   tsp salt substitute containing potassium chloride.  1  tablespoons sugar.  1 L (34 oz) of water.  Certain foods and beverages may increase the speed at which food moves through the gastrointestinal (GI) tract. These foods and beverages should be avoided and include:  Caffeinated and alcoholic beverages.  High-fiber foods, such as raw fruits and vegetables, nuts, seeds, and whole grain breads and cereals.  Foods and beverages sweetened with sugar alcohols, such as xylitol, sorbitol, and mannitol.  Some foods may be well tolerated and may help thicken stool including:  Starchy foods, such as rice, toast, pasta, low-sugar cereal, oatmeal, grits, baked potatoes, crackers, and bagels.   Bananas.   Applesauce.  Add probiotic-rich foods to help increase healthy bacteria in the GI tract, such as yogurt and fermented milk products. RECOMMENDED FOODS AND BEVERAGES Starches Choose foods with less than 2 g of fiber per serving.  Recommended:  White, Fr[place] pita breads, plain rolls, buns, bagels. Plain muffins, matzo. Soda, saltine, or graham crackers. Pretzels, melba toast, zwieback. Cooked cereals made with water: cornmeal, farina, cream  cereals. Dry cereals: refined corn, wheat, rice. Potatoes prepared any way without skins, refined macaroni, spaghetti, noodles, refined rice.  Avoid:  Bread, rolls, or crackers made with whole wheat, multi-grains, rye, bran seeds, nuts, or coconut. Corn tortillas or taco shells. Cereals containing whole grains, multi-grains, bran, coconut, nuts, raisins. Cooked or dry oatmeal.  Coarse wheat cereals, granola. Cereals advertised as "high-fiber." Potato skins. Whole grain pasta, wild or brown rice. Popcorn. Sweet potatoes, yams. Sweet rolls, doughnuts, waffles, pancakes, sweet breads. Vegetables  Recommended: Strained tomato and vegetable juices. Most well-cooked and canned vegetables without seeds. Fresh: Tender lettuce, cucumber without the skin, cabbage, spinach, bean sprouts.  Avoid: Fresh, cooked, or canned: Artichokes, baked beans, beet greens, broccoli, Brussels sprouts, corn, kale, legumes, peas, sweet potatoes. Cooked: Green or red cabbage, spinach. Avoid large servings of any vegetables because vegetables shrink when cooked, and they contain more fiber per serving than fresh vegetables. Fruit  Recommended: Cooked or canned: Apricots, applesauce, cantaloupe, cherries, fruit cocktail, grapefruit, grapes, kiwi, mandarin oranges, peaches, pears, plums, watermelon. Fresh: Apples without skin, ripe banana, grapes, cantaloupe, cherries, grapefruit, peaches, oranges, plums. Keep servings limited to  cup or 1 piece.  Avoid: Fresh: Apples with skin, apricots, mangoes, pears, raspberries, strawberries. Prune juice, stewed or dried prunes. Dried fruits, raisins, dates. Large servings of all fresh fruits. Protein  Recommended: Ground or well-cooked tender beef, ham, veal, lamb, pork, or poultry. Eggs. Fish, oysters, shrimp, lobster, other seafoods. Liver, organ meats.  Avoid: Tough, fibrous meats with gristle. Peanut butter, smooth or chunky. Cheese, nuts, seeds, legumes, dried peas, beans, lentils. Dairy  Recommended: Yogurt, lactose-free milk, kefir, drinkable yogurt, buttermilk, soy milk, or plain hard cheese.  Avoid: Milk, chocolate milk, beverages made with milk, such as milkshakes. Soups  Recommended: Bouillon, broth, or soups made from allowed foods. Any strained soup.  Avoid: Soups made from vegetables that are not allowed, cream or milk-based soups. Desserts  and Sweets  Recommended: Sugar-free gelatin, sugar-free frozen ice pops made without sugar alcohol.  Avoid: Plain cakes and cookies, pie made with fruit, pudding, custard, cream pie. Gelatin, fruit, ice, sherbet, frozen ice pops. Ice cream, ice milk without nuts. Plain hard candy, honey, jelly, molasses, syrup, sugar, chocolate syrup, gumdrops, marshmallows. Fats and Oils  Recommended: Limit fats to less than 8 tsp per day.  Avoid: Seeds, nuts, olives, avocados. Margarine, butter, cream, mayonnaise, salad oils, plain salad dressings. Plain gravy, crisp bacon without rind. Beverages  Recommended: Water, decaffeinated teas, oral rehydration solutions, sugar-free beverages not sweetened with sugar alcohols.  Avoid: Fruit juices, caffeinated beverages (coffee, tea, soda), alcohol, sports drinks, or lemon-lime soda. Condiments  Recommended: Ketchup, mustard, horseradish, vinegar, cocoa powder. Spices in moderation: allspice, basil, bay leaves, celery powder or leaves, cinnamon, cumin powder, curry powder, ginger, mace, marjoram, onion or garlic powder, oregano, paprika, parsley flakes, ground pepper, rosemary, sage, savory, tarragon, thyme, turmeric.  Avoid: Coconut, honey. Document Released: 02/05/2004 Document Revised: 08/09/2012 Document Reviewed: 03/31/2012 [facility] Patient Information 2014 [place].

## 2014-01-14 NOTE — Assessment & Plan Note (Addendum)
Unchanged.Smokes about 12 per day Patient counseled for approximately 5 minutes regarding the health risks of ongoing nicotine use, specifically all types of cancer, heart disease, stroke and respiratory failure. The options available for help with cessation ,the behavioral changes to assist the process, and the option to either gradully reduce usage  Or abruptly stop.is also discussed. Pt is also encouraged to set specific goals in number of cigarettes used daily, as well as to set a quit date.

## 2014-01-14 NOTE — Progress Notes (Signed)
Subjective:    Patient ID: Leslie Lyons, female    DOB: 1961/02/15, 53 y.o.   MRN: [id number]  HPI The PT is here for follow up and re-evaluation of chronic medical conditions, medication management and review of any available recent lab and radiology data.  Preventive health is updated, specifically  Cancer screening and Immunization.   Questions or concerns regarding consultations or procedures which the PT has had in the interim are  addressed. The PT denies any adverse reactions to current medications since the last visit.  States she has had  several deaths in the family in recent times, which is distressing. Had vomit x 1 this morning at work, and loose stool x 1 today, work associate has similar symptoms States she had started losing weight , had lost weight but afterwards started having generalized joint pains, which reversed the effort      Review of Systems See HPI Denies recent fever or chills. Denies sinus pressure, nasal congestion, ear pain or sore throat. Denies chest congestion, productive cough or wheezing. Denies chest pains, palpitations and leg swelling    Denies dysuria, frequency, hesitancy or incontinence. Back and lower extremity pain flared up since end December, states needs FMLA due to uncontrolled back pain, has lower extremity numbness and tingling, states previous form by neurologist made her exempt from specific foot wear etc, I have asked that she have a copy  Of last form sent for me to review if she wants me to complete form this year Denies headaches or  Seizures.  Denies uncontrolled  depression, anxiety or insomnia. Denies skin break down or rash.        Objective:   Physical Exam  Patient alert and oriented and in no cardiopulmonary distress.  HEENT: No facial asymmetry, EOMI, no sinus tenderness,  oropharynx pink and moist.  Neck supple no adenopathy.  Chest: Clear to auscultation bilaterally.  CVS: S1, S2 no murmurs, no  S3.  ABD: Soft non tender. Bowel sounds normal.  Ext: No edema  GH:[id number]  ROM spine,adequate in  shoulders, hips and knees.  Skin: Intact, no ulcerations or rash noted.  Psych: Good eye contact, normal affect. Memory intact  anxious not depressed appearing.  CNS: CN 2-12 intact, power, tone and sensation normal throughout.       Assessment & Plan:  HYPERTENSION Controlled, no change in medication DASH diet and commitment to daily physical activity for a minimum of 30 minutes discussed and encouraged, as a part of hypertension management. The importance of attaining a healthy weight is also discussed.   ASTHMA, UNSPECIFIED, UNSPECIFIED STATUS Controlled, no recent flares  Prediabetes Patient educated about the importance of limiting  Carbohydrate intake , the need to commit to daily physical activity for a minimum of 30 minutes , and to commit weight loss. The fact that changes in all these areas will reduce or eliminate all together the development of diabetes is stressed.   Updated lab needed  Back pain, chronic 6 week h/o back pain radiaiting down right posterior thigh down to the toes Short sharp anti inflammatory course  Nicotine dependence Unchanged.Smokes about 12 per day Patient counseled for approximately 5 minutes regarding the health risks of ongoing nicotine use, specifically all types of cancer, heart disease, stroke and respiratory failure. The options available for help with cessation ,the behavioral changes to assist the process, and the option to either gradully reduce usage  Or abruptly stop.is also discussed. Pt is also encouraged to set specific  goals in number of cigarettes used daily, as well as to set a quit date.   OBESITY, UNSPECIFIED Deteriorated. Patient re-educated about  the importance of commitment to a  minimum of 150 minutes of exercise per week. The importance of healthy food choices with portion control discussed. Encouraged to start  a food diary, count calories and to consider  joining a support group. Sample diet sheets offered. Goals set by the patient for the next several months.     HYPERLIPIDEMIA Uncontrolled labs past due Hyperlipidemia:Low fat diet discussed and encouraged.     Gastroenteritis, acute Less than 24 hours of symptoms, no specific med needed based on symptoms, pt ed  provided

## 2014-01-14 NOTE — Assessment & Plan Note (Signed)
Deteriorated. Patient re-educated about  the importance of commitment to a  minimum of 150 minutes of exercise per week. The importance of healthy food choices with portion control discussed. Encouraged to start a food diary, count calories and to consider  joining a support group. Sample diet sheets offered. Goals set by the patient for the next several months.    

## 2014-01-14 NOTE — Assessment & Plan Note (Signed)
Patient educated about the importance of limiting  Carbohydrate intake , the need to commit to daily physical activity for a minimum of 30 minutes , and to commit weight loss. The fact that changes in all these areas will reduce or eliminate all together the development of diabetes is stressed.   Updated lab needed

## 2014-01-14 NOTE — Assessment & Plan Note (Signed)
Controlled, no change in medication DASH diet and commitment to daily physical activity for a minimum of 30 minutes discussed and encouraged, as a part of hypertension management. The importance of attaining a healthy weight is also discussed.  

## 2014-01-15 DIAGNOSIS — K529 Noninfective gastroenteritis and colitis, unspecified: Secondary | ICD-10-CM | POA: Insufficient documentation

## 2014-01-15 NOTE — Assessment & Plan Note (Signed)
Less than 24 hours of symptoms, no specific med needed based on symptoms, pt ed  provided

## 2014-01-15 NOTE — Assessment & Plan Note (Signed)
Uncontrolled labs past due Hyperlipidemia:Low fat diet discussed and encouraged.

## 2014-01-16 ENCOUNTER — Other Ambulatory Visit: Payer: Self-pay

## 2014-01-16 MED ORDER — IPRATROPIUM-ALBUTEROL 0.5-2.5 (3) MG/3ML IN SOLN: 3.0000 mL | Freq: Four times a day (QID) | RESPIRATORY_TRACT | Status: DC | PRN

## 2014-02-19 ENCOUNTER — Encounter: Payer: Self-pay | Admitting: Family Medicine

## 2014-02-19 ENCOUNTER — Ambulatory Visit (INDEPENDENT_AMBULATORY_CARE_PROVIDER_SITE_OTHER): Payer: 59 | Admitting: Family Medicine

## 2014-02-19 ENCOUNTER — Encounter (INDEPENDENT_AMBULATORY_CARE_PROVIDER_SITE_OTHER): Payer: Self-pay

## 2014-02-19 VITALS — BP 142/80 | HR 120 | Resp 18 | Ht 62.0 in | Wt 208.0 lb

## 2014-02-19 DIAGNOSIS — E669 Obesity, unspecified: Secondary | ICD-10-CM

## 2014-02-19 DIAGNOSIS — I10 Essential (primary) hypertension: Secondary | ICD-10-CM

## 2014-02-19 DIAGNOSIS — J45909 Unspecified asthma, uncomplicated: Secondary | ICD-10-CM

## 2014-02-19 MED ORDER — PREDNISONE (PAK) 5 MG PO TABS: 5.0000 mg | ORAL_TABLET | ORAL | Status: DC

## 2014-02-19 MED ORDER — METHYLPREDNISOLONE ACETATE 80 MG/ML IJ SUSP
80.0000 mg | Freq: Once | INTRAMUSCULAR | Status: AC
  Administered 2014-02-19: 80 mg via INTRAMUSCULAR

## 2014-02-19 MED ORDER — MONTELUKAST SODIUM 10 MG PO TABS: 10.0000 mg | ORAL_TABLET | Freq: Every day | ORAL | Status: DC

## 2014-02-19 NOTE — Progress Notes (Signed)
   Subjective:    Patient ID: Leslie Lyons, female    DOB: [birth year], 53 y.o.   MRN: [id number]  HPI Pt states that on the job last week Friday on the job, she had an asthma attack which was triggered by one of her co workers at approx 5:55am , Pt states  Her shift generally ends at 7am, so she opted to use her proventil inhaler and returned to work station. States her supervisor was aware that she had acute breathing difficulty on  The job. Staes her asthma kicked in the following day/ Saturday, she started doing breathing treatments  At home with neb machine , up to 3 per day, has been taking regularly at home up to today, took one treatment today at midday, still no better, denies recent fever , chills or productive cough  Review of Systems See HPI Denies recent fever or chills. Denies sinus pressure, nasal congestion, ear pain or sore throat. Denies chest congestion, productive cough or wheezing. Denies chest pains, palpitations and leg swelling Denies depression, anxiety or insomnia. Denies skin break down or rash.        Objective:   Physical Exam BP 142/80  Pulse 120  Resp 18  Ht 5' 2"  (1.575 m)  Wt 208 lb 0.6 oz (94.366 kg)  BMI 38.04 kg/m2  SpO2 94% Patient alert and oriented and in mild  cardiopulmonary distress.  HEENT: No facial asymmetry, EOMI,   oropharynx pink and moist.  Neck supple no JVD, no mass.  Chest: decreased air entry throughout with bilateral wheezing.  CVS: S1, S2 no murmurs, no S3.Regular rate.  ABD: Soft non tender.   Ext: No edema  MS: Adequate ROM spine, shoulders, hips and knees.  CNS: CN 2 to 12 intact        Assessment & Plan:  OBESITY, UNSPECIFIED Improved. Pt applauded on succesful weight loss through lifestyle change, and encouraged to continue same. Weight loss goal set for the next several months.   HYPERTENSION Uncontrolled at this visit, no med change, will re assess at next visit DASH diet and commitment to  daily physical activity for a minimum of 30 minutes discussed and encouraged, as a part of hypertension management. The importance of attaining a healthy weight is also discussed.   ASTHMA, UNSPECIFIED, UNSPECIFIED STATUS Acute flare, currently uncontrolled, depo medrol in offfice followed by short course of prednisone, also work excuse

## 2014-02-19 NOTE — Patient Instructions (Signed)
F/u as before  You will get injection in office  for asthma flare, and 6 day course of prednisone is sent in start today  Work excuse from 3/23 to return 02/21/2014  Congrats on weight loss, keep it up, and I am happy that your back has improved.  New medication singulair one daily for asthma and allergies, continue daily flonase also

## 2014-03-31 ENCOUNTER — Other Ambulatory Visit: Payer: Self-pay | Admitting: Family Medicine

## 2014-04-16 ENCOUNTER — Ambulatory Visit (INDEPENDENT_AMBULATORY_CARE_PROVIDER_SITE_OTHER): Payer: 59 | Admitting: Family Medicine

## 2014-04-16 ENCOUNTER — Encounter: Payer: Self-pay | Admitting: Family Medicine

## 2014-04-16 ENCOUNTER — Encounter (INDEPENDENT_AMBULATORY_CARE_PROVIDER_SITE_OTHER): Payer: Self-pay

## 2014-04-16 VITALS — BP 122/82 | HR 100 | Resp 20 | Wt 204.0 lb

## 2014-04-16 DIAGNOSIS — G8929 Other chronic pain: Secondary | ICD-10-CM

## 2014-04-16 DIAGNOSIS — J45901 Unspecified asthma with (acute) exacerbation: Secondary | ICD-10-CM

## 2014-04-16 DIAGNOSIS — I10 Essential (primary) hypertension: Secondary | ICD-10-CM

## 2014-04-16 DIAGNOSIS — F17218 Nicotine dependence, cigarettes, with other nicotine-induced disorders: Secondary | ICD-10-CM

## 2014-04-16 DIAGNOSIS — F19988 Other psychoactive substance use, unspecified with other psychoactive substance-induced disorder: Secondary | ICD-10-CM

## 2014-04-16 DIAGNOSIS — J4551 Severe persistent asthma with (acute) exacerbation: Secondary | ICD-10-CM

## 2014-04-16 DIAGNOSIS — J209 Acute bronchitis, unspecified: Secondary | ICD-10-CM | POA: Insufficient documentation

## 2014-04-16 DIAGNOSIS — M549 Dorsalgia, unspecified: Principal | ICD-10-CM

## 2014-04-16 DIAGNOSIS — F17200 Nicotine dependence, unspecified, uncomplicated: Secondary | ICD-10-CM

## 2014-04-16 MED ORDER — AZITHROMYCIN 250 MG PO TABS: ORAL_TABLET | ORAL | Status: DC

## 2014-04-16 MED ORDER — PREDNISONE (PAK) 5 MG PO TABS: 5.0000 mg | ORAL_TABLET | ORAL | Status: DC

## 2014-04-16 MED ORDER — IBUPROFEN 800 MG PO TABS: 800.0000 mg | ORAL_TABLET | Freq: Three times a day (TID) | ORAL | Status: DC | PRN

## 2014-04-16 MED ORDER — TRAMADOL HCL 50 MG PO TABS: ORAL_TABLET | ORAL | Status: DC

## 2014-04-16 NOTE — Progress Notes (Signed)
   Subjective:    Patient ID: Leslie Lyons, female    DOB: 1961/01/25, 53 y.o.   MRN: [id number]  HPI Sickness started last week Friday, cough yellow sputum, no fever has had chills, ubnable to work last night. Increased nasal congestion with yellow drainage Increased back pin, worst ever, limiting movement Increased wheezing and shortness of breath, experiencing asthma flare   Review of Systems See HPI Denies chest pains, palpitations and leg swelling Denies abdominal pain, nausea, vomiting,diarrhea or constipation.   Denies dysuria, frequency, hesitancy or incontinence.  Denies headaches, seizures, numbness, or tingling. Denies depression, anxiety or insomnia. Denies skin break down or rash.        Objective:   Physical Exam BP 122/82  Pulse 100  Resp 20  Wt 204 lb 0.6 oz (92.552 kg)  SpO2 100% Patient alert and oriented and in no cardiopulmonary distress.  HEENT: No facial asymmetry, EOMI,   oropharynx pink and moist.  Neck supple no JVD, no mass. Nasal erythema and edema  Chest: decreased air entry , scattered crackles and wheezes  CVS: S1, S2 no murmurs, no S3.Regular rate.  ABD: Soft non tender.   Ext: No edema  MS: decreased  ROM lumbar spine,  Normal in shoulders, hips and knees.  Skin: Intact, no ulcerations or rash noted.  Psych: Good eye contact, normal affect. Memory intact not anxious or depressed appearing.  CNS: CN 2-12 intact, power,  normal throughout.no focal deficits noted.        Assessment & Plan:  Acute asthma flare Current flare, high dose steroids including depo medrol in office and work excuse also  Back pain, chronic Acute flare uncontrolled, toradol and depo medrol in office , short course of ibuprofen and tramadol at bedtime Flare likely due to excess cough  Nicotine dependence Unchanged, unwilling to committing to quitting Patient counseled for approximately 5 minutes regarding the health risks of ongoing nicotine use,  specifically all types of cancer, heart disease, stroke and respiratory failure. The options available for help with cessation ,the behavioral changes to assist the process, and the option to either gradully reduce usage  Or abruptly stop.is also discussed. Pt is also encouraged to set specific goals in number of cigarettes used daily, as well as to set a quit date.   Essential hypertension Controlled, no change in medication DASH diet and commitment to daily physical activity for a minimum of 30 minutes discussed and encouraged, as a part of hypertension management. The importance of attaining a healthy weight is also discussed.   Acute bronchitis 3 day history of cough with yello sputum and asthma flare, z pack prescribed

## 2014-04-16 NOTE — Patient Instructions (Addendum)
F/ui as before  Toradol 69m and depomedrol 8-mg IM in office , followed by prednisone dose pack and ibuprofen for 1 week for back pain, al;so tramadol sent for bedtime use as needed  Five day antibiotic course sent , z pack  Work excuse from today return Apr 18, 2014

## 2014-04-17 DIAGNOSIS — M549 Dorsalgia, unspecified: Secondary | ICD-10-CM

## 2014-04-17 DIAGNOSIS — J209 Acute bronchitis, unspecified: Secondary | ICD-10-CM

## 2014-04-17 DIAGNOSIS — G8929 Other chronic pain: Secondary | ICD-10-CM

## 2014-04-17 MED ORDER — METHYLPREDNISOLONE ACETATE 80 MG/ML IJ SUSP
80.0000 mg | Freq: Once | INTRAMUSCULAR | Status: AC
  Administered 2014-04-17: 80 mg via INTRAMUSCULAR

## 2014-04-17 MED ORDER — KETOROLAC TROMETHAMINE 60 MG/2ML IM SOLN
60.0000 mg | Freq: Once | INTRAMUSCULAR | Status: AC
  Administered 2014-04-17: 60 mg via INTRAMUSCULAR

## 2014-04-30 ENCOUNTER — Other Ambulatory Visit: Payer: Self-pay | Admitting: Family Medicine

## 2014-05-14 ENCOUNTER — Encounter: Payer: Self-pay | Admitting: Family Medicine

## 2014-05-14 ENCOUNTER — Ambulatory Visit (INDEPENDENT_AMBULATORY_CARE_PROVIDER_SITE_OTHER): Payer: 59 | Admitting: Family Medicine

## 2014-05-14 VITALS — BP 118/80 | HR 112 | Resp 16 | Wt 205.1 lb

## 2014-05-14 DIAGNOSIS — G8929 Other chronic pain: Secondary | ICD-10-CM

## 2014-05-14 DIAGNOSIS — R7309 Other abnormal glucose: Secondary | ICD-10-CM

## 2014-05-14 DIAGNOSIS — J45909 Unspecified asthma, uncomplicated: Secondary | ICD-10-CM

## 2014-05-14 DIAGNOSIS — M549 Dorsalgia, unspecified: Secondary | ICD-10-CM

## 2014-05-14 DIAGNOSIS — E785 Hyperlipidemia, unspecified: Secondary | ICD-10-CM

## 2014-05-14 DIAGNOSIS — F19988 Other psychoactive substance use, unspecified with other psychoactive substance-induced disorder: Secondary | ICD-10-CM

## 2014-05-14 DIAGNOSIS — K644 Residual hemorrhoidal skin tags: Secondary | ICD-10-CM

## 2014-05-14 DIAGNOSIS — F411 Generalized anxiety disorder: Secondary | ICD-10-CM

## 2014-05-14 DIAGNOSIS — F17200 Nicotine dependence, unspecified, uncomplicated: Secondary | ICD-10-CM

## 2014-05-14 DIAGNOSIS — F17218 Nicotine dependence, cigarettes, with other nicotine-induced disorders: Secondary | ICD-10-CM

## 2014-05-14 DIAGNOSIS — R7303 Prediabetes: Secondary | ICD-10-CM

## 2014-05-14 DIAGNOSIS — J4520 Mild intermittent asthma, uncomplicated: Secondary | ICD-10-CM

## 2014-05-14 DIAGNOSIS — E669 Obesity, unspecified: Secondary | ICD-10-CM

## 2014-05-14 DIAGNOSIS — I10 Essential (primary) hypertension: Secondary | ICD-10-CM

## 2014-05-14 LAB — COMPREHENSIVE METABOLIC PANEL
ALT: 32 U/L (ref 0–35)
AST: 21 U/L (ref 0–37)
Albumin: 4.4 g/dL (ref 3.5–5.2)
Alkaline Phosphatase: 69 U/L (ref 39–117)
BUN: 9 mg/dL (ref 6–23)
CALCIUM: 10 mg/dL (ref 8.4–10.5)
CHLORIDE: 100 meq/L (ref 96–112)
CO2: 28 mEq/L (ref 19–32)
Creat: 0.74 mg/dL (ref 0.50–1.10)
Glucose, Bld: 87 mg/dL (ref 70–99)
POTASSIUM: 4.4 meq/L (ref 3.5–5.3)
SODIUM: 138 meq/L (ref 135–145)
Total Bilirubin: 0.3 mg/dL (ref 0.2–1.2)
Total Protein: 7.3 g/dL (ref 6.0–8.3)

## 2014-05-14 LAB — HEMOGLOBIN A1C
HEMOGLOBIN A1C: 6.3 % — AB (ref <5.7)
Mean Plasma Glucose: 134 mg/dL — ABNORMAL HIGH (ref <117)

## 2014-05-14 LAB — LIPID PANEL
Cholesterol: 182 mg/dL (ref 0–200)
HDL: 55 mg/dL (ref >39)
LDL Cholesterol: 99 mg/dL (ref 0–99)
Total CHOL/HDL Ratio: 3.3 Ratio
Triglycerides: 141 mg/dL (ref <150)
VLDL: 28 mg/dL (ref 0–40)

## 2014-05-14 MED ORDER — PHENTERMINE HCL 37.5 MG PO TABS: 37.5000 mg | ORAL_TABLET | Freq: Every day | ORAL | Status: DC

## 2014-05-14 MED ORDER — BUDESONIDE-FORMOTEROL FUMARATE 160-4.5 MCG/ACT IN AERO: 2.0000 | INHALATION_SPRAY | Freq: Two times a day (BID) | RESPIRATORY_TRACT | Status: DC

## 2014-05-14 NOTE — Patient Instructions (Signed)
F/u in 4 month, call if you need me before  Congrats on weight loss, goal is 2.5 months each month  It is important that you exercise regularly at least 30 minutes 5 times a week. If you develop chest pain, have severe difficulty breathing, or feel very tired, stop exercising immediately and seek medical attention   Increase in symbicort dose since wheezing more  PLS get your mammogram you need this   Pls bring  3 stool cards next visit for colon screen, nurse will explain collection

## 2014-05-14 NOTE — Assessment & Plan Note (Signed)
Improved. Pt applauded on succesful weight loss through lifestyle change, and encouraged to continue same. Weight loss goal set for the next several months. Goal of 2.5 pounds per  month

## 2014-05-14 NOTE — Assessment & Plan Note (Addendum)
Current is approx 5 , will quit by thanksgiving Patient counseled for approximately 5 minutes regarding the health risks of ongoing nicotine use, specifically all types of cancer, heart disease, stroke and respiratory failure. The options available for help with cessation ,the behavioral changes to assist the process, and the option to either gradully reduce usage  Or abruptly stop.is also discussed. Pt is also encouraged to set specific goals in number of cigarettes used daily, as well as to set a quit date.

## 2014-05-14 NOTE — Assessment & Plan Note (Addendum)
Uncontrolled, increased symbicort dose

## 2014-05-15 NOTE — Progress Notes (Signed)
Subjective:    Patient ID: Leslie Lyons, female    DOB: [birth year], 53 y.o.   MRN: [id number]  HPI The PT is here for follow up and re-evaluation of chronic medical conditions, medication management and review of any available recent lab and radiology data.  Preventive health is updated, specifically  Cancer screening and Immunization. Still needs her mammogram   The PT denies any adverse reactions to current medications since the last visit.  notes increase u in wheezing and shortness of breath in the past month, though she is using her inhaler daily as recommended      Review of Systems See HPI Denies recent fever or chills. Denies sinus pressure, nasal congestion, has  increased  wheezing. Denies chest pains, palpitations and leg swelling Denies abdominal pain, nausea, vomiting,diarrhea or constipation.   Denies dysuria, frequency, hesitancy or incontinence. Chronic back pain improved on current med Denies headaches, seizures, . Denies depression,or uncontrolled  anxiety or insomnia. Denies skin break down or rash.        Objective:   Physical Exam  BP 118/80  Pulse 112  Resp 16  Wt 205 lb 1.9 oz (93.042 kg)  SpO2 96%  Patient alert and oriented and in no cardiopulmonary distress.  HEENT: No facial asymmetry, EOMI,   oropharynx pink and moist.  Neck supple no JVD, no mass.  Chest: Clear to auscultation bilaterally.Decreased air entry throughout , though adequate  CVS: S1, S2 no murmurs, no S3.Regular rate.  ABD: Soft non tender.   Ext: No edema  MS: Adequate ROM spine, shoulders, hips and knees.  Skin: Intact, no ulcerations or rash noted.  Psych: Good eye contact, normal affect. Memory intact not anxious or depressed appearing.  CNS: CN 2-12 intact, power,  normal throughout.no focal deficits noted.       Assessment & Plan:  Nicotine dependence Current is approx 5 , will quit by thanksgiving Patient counseled for approximately 5 minutes  regarding the health risks of ongoing nicotine use, specifically all types of cancer, heart disease, stroke and respiratory failure. The options available for help with cessation ,the behavioral changes to assist the process, and the option to either gradully reduce usage  Or abruptly stop.is also discussed. Pt is also encouraged to set specific goals in number of cigarettes used daily, as well as to set a quit date.   OBESITY, UNSPECIFIED Improved. Pt applauded on succesful weight loss through lifestyle change, and encouraged to continue same. Weight loss goal set for the next several months. Goal of 2.5 pounds per  month  ASTHMA, UNSPECIFIED, UNSPECIFIED STATUS Uncontrolled, increased symbicort dose  HYPERLIPIDEMIA Improved and now controlled No med change Hyperlipidemia:Low fat diet discussed and encouraged.    ANXIETY Controlled, no change in medication   HYPERTENSION Controlled, no change in medication DASH diet and commitment to daily physical activity for a minimum of 30 minutes discussed and encouraged, as a part of hypertension management. The importance of attaining a healthy weight is also discussed.   EXTERNAL HEMORRHOIDS Remain chronically swollen , pt to bring in stool cards for testing for hidden blood in the stool, as rectal exam is too painful Denies any recent change in stool character  Back pain, chronic Improved and controlled with as needed tramadol  Prediabetes Updated lab needed at/ before next visit. Patient educated about the importance of limiting  Carbohydrate intake , the need to commit to daily physical activity for a minimum of 30 minutes , and to commit weight loss.  The fact that changes in all these areas will reduce or eliminate all together the development of diabetes is stressed.

## 2014-05-15 NOTE — Assessment & Plan Note (Addendum)
Remain chronically swollen , pt to bring in stool cards for testing for hidden blood in the stool, as rectal exam is too painful Denies any recent change in stool character

## 2014-05-15 NOTE — Assessment & Plan Note (Signed)
Controlled, no change in medication  

## 2014-05-15 NOTE — Assessment & Plan Note (Signed)
Controlled, no change in medication DASH diet and commitment to daily physical activity for a minimum of 30 minutes discussed and encouraged, as a part of hypertension management. The importance of attaining a healthy weight is also discussed.  

## 2014-05-15 NOTE — Assessment & Plan Note (Signed)
Improved and controlled with as needed tramadol

## 2014-05-15 NOTE — Assessment & Plan Note (Signed)
Improved and now controlled No med change Hyperlipidemia:Low fat diet discussed and encouraged.

## 2014-05-24 DIAGNOSIS — Z0279 Encounter for issue of other medical certificate: Secondary | ICD-10-CM

## 2014-05-30 ENCOUNTER — Other Ambulatory Visit: Payer: Self-pay | Admitting: Family Medicine

## 2014-06-12 ENCOUNTER — Telehealth: Payer: Self-pay | Admitting: Family Medicine

## 2014-06-12 NOTE — Telephone Encounter (Signed)
Noted.  Called and asked for replacement form to be faxed to this office.

## 2014-06-13 NOTE — Telephone Encounter (Signed)
fmla completed.  Faxed to employer and patient by to collect.

## 2014-06-16 ENCOUNTER — Encounter: Payer: Self-pay | Admitting: Family Medicine

## 2014-06-16 NOTE — Assessment & Plan Note (Signed)
Improved. Pt applauded on succesful weight loss through lifestyle change, and encouraged to continue same. Weight loss goal set for the next several months.  

## 2014-06-16 NOTE — Assessment & Plan Note (Signed)
Uncontrolled at this visit, no med change, will re assess at next visit DASH diet and commitment to daily physical activity for a minimum of 30 minutes discussed and encouraged, as a part of hypertension management. The importance of attaining a healthy weight is also discussed.

## 2014-06-16 NOTE — Assessment & Plan Note (Signed)
Acute flare, currently uncontrolled, depo medrol in offfice followed by short course of prednisone, also work excuse

## 2014-06-22 NOTE — Assessment & Plan Note (Signed)
Updated lab needed at/ before next visit. Patient educated about the importance of limiting  Carbohydrate intake , the need to commit to daily physical activity for a minimum of 30 minutes , and to commit weight loss. The fact that changes in all these areas will reduce or eliminate all together the development of diabetes is stressed.

## 2014-07-04 ENCOUNTER — Other Ambulatory Visit: Payer: Self-pay | Admitting: Family Medicine

## 2014-07-09 LAB — PULMONARY FUNCTION TEST

## 2014-07-22 ENCOUNTER — Encounter: Payer: Self-pay | Admitting: Family Medicine

## 2014-07-22 ENCOUNTER — Ambulatory Visit (INDEPENDENT_AMBULATORY_CARE_PROVIDER_SITE_OTHER): Payer: BC Managed Care – PPO | Admitting: Family Medicine

## 2014-07-22 ENCOUNTER — Encounter (INDEPENDENT_AMBULATORY_CARE_PROVIDER_SITE_OTHER): Payer: Self-pay

## 2014-07-22 VITALS — BP 114/82 | HR 129 | Temp 98.4°F | Resp 18 | Ht 62.0 in | Wt 206.8 lb

## 2014-07-22 DIAGNOSIS — F17200 Nicotine dependence, unspecified, uncomplicated: Secondary | ICD-10-CM

## 2014-07-22 DIAGNOSIS — F19988 Other psychoactive substance use, unspecified with other psychoactive substance-induced disorder: Secondary | ICD-10-CM

## 2014-07-22 DIAGNOSIS — F17218 Nicotine dependence, cigarettes, with other nicotine-induced disorders: Secondary | ICD-10-CM

## 2014-07-22 DIAGNOSIS — G8929 Other chronic pain: Secondary | ICD-10-CM

## 2014-07-22 DIAGNOSIS — L299 Pruritus, unspecified: Secondary | ICD-10-CM

## 2014-07-22 DIAGNOSIS — M549 Dorsalgia, unspecified: Secondary | ICD-10-CM

## 2014-07-22 DIAGNOSIS — I10 Essential (primary) hypertension: Secondary | ICD-10-CM

## 2014-07-22 DIAGNOSIS — Z23 Encounter for immunization: Secondary | ICD-10-CM

## 2014-07-22 DIAGNOSIS — J45901 Unspecified asthma with (acute) exacerbation: Secondary | ICD-10-CM

## 2014-07-22 MED ORDER — KETOROLAC TROMETHAMINE 60 MG/2ML IM SOLN
60.0000 mg | Freq: Once | INTRAMUSCULAR | Status: AC
  Administered 2014-07-22: 60 mg via INTRAMUSCULAR

## 2014-07-22 MED ORDER — TRAMADOL HCL 50 MG PO TABS: ORAL_TABLET | ORAL | Status: DC

## 2014-07-22 MED ORDER — AMLODIPINE BESYLATE 2.5 MG PO TABS: ORAL_TABLET | ORAL | Status: DC

## 2014-07-22 MED ORDER — METHYLPREDNISOLONE ACETATE 80 MG/ML IJ SUSP
80.0000 mg | Freq: Once | INTRAMUSCULAR | Status: AC
  Administered 2014-07-22: 80 mg via INTRAMUSCULAR

## 2014-07-22 MED ORDER — HYDROXYZINE HCL 25 MG PO TABS: ORAL_TABLET | ORAL | Status: DC

## 2014-07-22 NOTE — Patient Instructions (Addendum)
F/u as before.  Script for mask today   Work note from last Wednesday to return in am  Toradol today and depo medrol today for back pain   Flu vaccine today  New for itching is hydroxyzine one to two at bedtime, this is sent in  Tramadol is refilled for pain

## 2014-08-05 ENCOUNTER — Other Ambulatory Visit: Payer: Self-pay | Admitting: Family Medicine

## 2014-08-05 DIAGNOSIS — Z23 Encounter for immunization: Secondary | ICD-10-CM | POA: Insufficient documentation

## 2014-08-05 NOTE — Assessment & Plan Note (Signed)
Uncontrolled with recent flare Work excuse from 8/19 to return 07/23/2014 Specific mask also written that pt needs for her job

## 2014-08-05 NOTE — Assessment & Plan Note (Signed)
Increased due to stress reportedly Patient counseled for approximately 5 minutes regarding the health risks of ongoing nicotine use, specifically all types of cancer, heart disease, stroke and respiratory failure. The options available for help with cessation ,the behavioral changes to assist the process, and the option to either gradully reduce usage  Or abruptly stop.is also discussed. Pt is also encouraged to set specific goals in number of cigarettes used daily, as well as to set a quit date.

## 2014-08-05 NOTE — Assessment & Plan Note (Signed)
likely trigger is increased anxiety and stress, stat hydroxyzine

## 2014-08-05 NOTE — Assessment & Plan Note (Signed)
Controlled, no change in medication DASH diet and commitment to daily physical activity for a minimum of 30 minutes discussed and encouraged, as a part of hypertension management. The importance of attaining a healthy weight is also discussed.  

## 2014-08-05 NOTE — Assessment & Plan Note (Signed)
Uncontrolled with current flare, anti inflammatories at vist and continue tramadol for chronic pain

## 2014-08-05 NOTE — Assessment & Plan Note (Signed)
Vaccine administered at visit.  

## 2014-08-05 NOTE — Progress Notes (Signed)
   Subjective:    Patient ID: Leslie Lyons, female    DOB: [birth year], 53 y.o.   MRN: [id number]  HPI 6 day h/o increased and uncontrolled wheezing, chest tightness and shortness of breath with cough and increased sputum She has had no fever or chills Increased and uncontrolled low back pain and spasm due to excessive cough, wants shots for this. C/o increased anxiety and depression due to her spouse's irresponsibility as far as working is concerned, she has  Been the sole bread winner for years and is frustrated about this.   Review of Systems See HPI Denies recent fever or chills. Denies sinus pressure, nasal congestion, ear pain or sore throat. . Denies chest pains, palpitations and leg swelling Denies abdominal pain, nausea, vomiting,diarrhea or constipation.   Denies dysuria, frequency, hesitancy or incontinence. Increased back pain and spasm C/o headaches, denies seizures, numbness, or tingling. Increased depression and anxiety since her spouse has not worked for years, lies about it, she has lost a house which she nearly paid for in full, and has been living in the basement of a mutual friend for years. This distresses her , sometimes she feels at her wits end about their marriage of over 38 years, feels as though his whole attitude changed with the death of their son Denies skin break down or rash.        Objective:   Physical Exam BP 114/82  Pulse 129  Temp(Src) 98.4 F (36.9 C) (Oral)  Resp 18  Ht 5' 2"  (1.575 m)  Wt 206 lb 12.8 oz (93.804 kg)  BMI 37.81 kg/m2  SpO2 99% Patient alert and oriented and in no cardiopulmonary distress.  HEENT: No facial asymmetry, EOMI,   oropharynx pink and moist.  Neck supple no JVD, no mass.  Chest: Decreased air entry with bilateral wheezes CVS: S1, S2 no murmurs, no S3.Regular rate.  ABD: Soft non tender.   Ext: No edema  MS: Decreased  ROM lumbar  spine, adequate in shoulders, hips and knees.  Skin: Intact, no  ulcerations or rash noted.  Psych: Good eye contact, normal affect. Memory intact  anxious and  depressed appearing.  CNS: CN 2-12 intact, power,  normal throughout.no focal deficits noted.        Assessment & Plan:  Acute asthma flare Uncontrolled with recent flare Work excuse from 8/19 to return 07/23/2014 Specific mask also written that pt needs for her job  Itching likely trigger is increased anxiety and stress, stat hydroxyzine  Back pain, chronic Uncontrolled with current flare, anti inflammatories at vist and continue tramadol for chronic pain  Nicotine dependence Increased due to stress reportedly Patient counseled for approximately 5 minutes regarding the health risks of ongoing nicotine use, specifically all types of cancer, heart disease, stroke and respiratory failure. The options available for help with cessation ,the behavioral changes to assist the process, and the option to either gradully reduce usage  Or abruptly stop.is also discussed. Pt is also encouraged to set specific goals in number of cigarettes used daily, as well as to set a quit date.   HYPERTENSION Controlled, no change in medication DASH diet and commitment to daily physical activity for a minimum of 30 minutes discussed and encouraged, as a part of hypertension management. The importance of attaining a healthy weight is also discussed.   Need for prophylactic vaccination and inoculation against influenza Vaccine administered at visit.

## 2014-09-26 DIAGNOSIS — J209 Acute bronchitis, unspecified: Secondary | ICD-10-CM | POA: Insufficient documentation

## 2014-09-26 NOTE — Assessment & Plan Note (Signed)
Unchanged, unwilling to committing to quitting Patient counseled for approximately 5 minutes regarding the health risks of ongoing nicotine use, specifically all types of cancer, heart disease, stroke and respiratory failure. The options available for help with cessation ,the behavioral changes to assist the process, and the option to either gradully reduce usage  Or abruptly stop.is also discussed. Pt is also encouraged to set specific goals in number of cigarettes used daily, as well as to set a quit date.

## 2014-09-26 NOTE — Assessment & Plan Note (Signed)
Controlled, no change in medication DASH diet and commitment to daily physical activity for a minimum of 30 minutes discussed and encouraged, as a part of hypertension management. The importance of attaining a healthy weight is also discussed.  

## 2014-09-26 NOTE — Assessment & Plan Note (Signed)
3 day history of cough with yello sputum and asthma flare, z pack prescribed

## 2014-09-26 NOTE — Assessment & Plan Note (Signed)
Current flare, high dose steroids including depo medrol in office and work excuse also

## 2014-09-26 NOTE — Assessment & Plan Note (Signed)
Acute flare uncontrolled, toradol and depo medrol in office , short course of ibuprofen and tramadol at bedtime Flare likely due to excess cough

## 2014-09-27 ENCOUNTER — Ambulatory Visit: Payer: 59 | Admitting: Family Medicine

## 2014-10-03 ENCOUNTER — Other Ambulatory Visit: Payer: Self-pay | Admitting: Family Medicine

## 2014-10-06 ENCOUNTER — Other Ambulatory Visit: Payer: Self-pay | Admitting: Family Medicine

## 2014-10-20 ENCOUNTER — Other Ambulatory Visit: Payer: Self-pay | Admitting: Family Medicine

## 2014-11-01 ENCOUNTER — Other Ambulatory Visit: Payer: Self-pay | Admitting: Family Medicine

## 2014-11-04 ENCOUNTER — Telehealth: Payer: Self-pay | Admitting: Family Medicine

## 2014-11-04 IMAGING — US US PELVIS COMPLETE
1 series · 14 of 25 positions shown · non-contrast
Comparison: 12/30/2011.

CLINICAL DATA: Followup left ovarian cyst.

EXAM:
TRANSABDOMINAL AND TRANSVAGINAL ULTRASOUND OF PELVIS
TECHNIQUE: Both transabdominal and transvaginal ultrasound examinations of the
pelvis were performed. Transabdominal technique was performed for
global imaging of the pelvis including uterus, ovaries, adnexal
regions, and pelvic cul-de-sac. It was necessary to proceed with
endovaginal exam following the transabdominal exam to visualize the
left ovary.

[Series 1: us pelvis complete · 0.26mm/px · 14 of 41 slices shown]
[im 1/41]
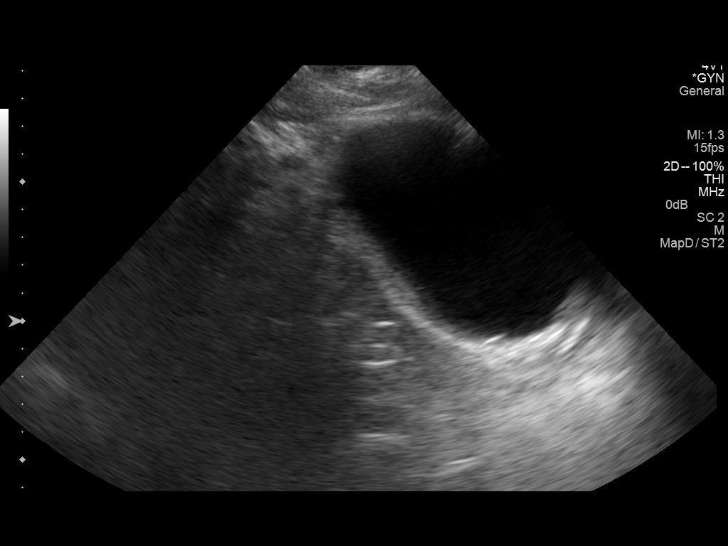
[im 4/41]
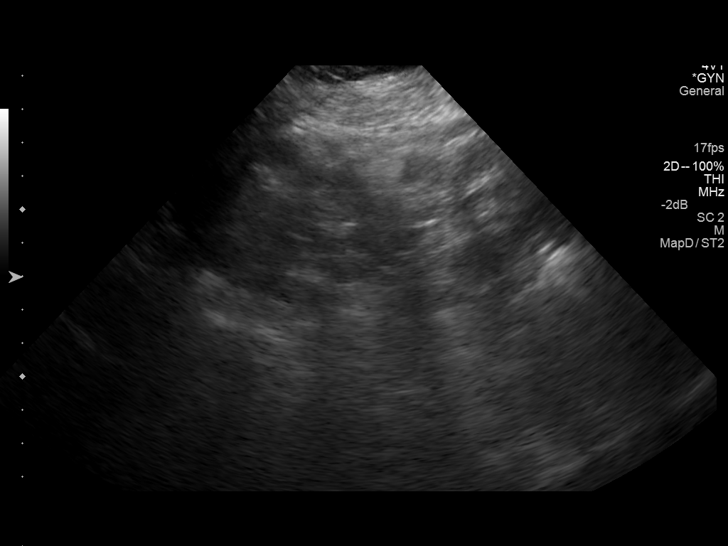
[im 7/41]
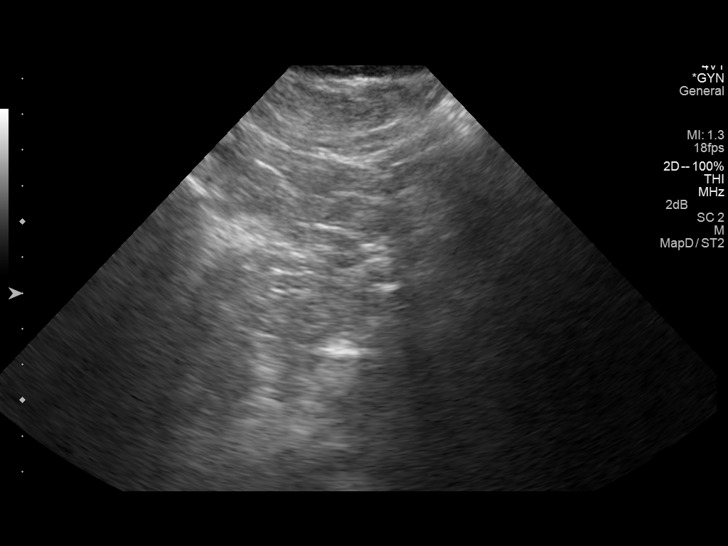
[im 11/41]
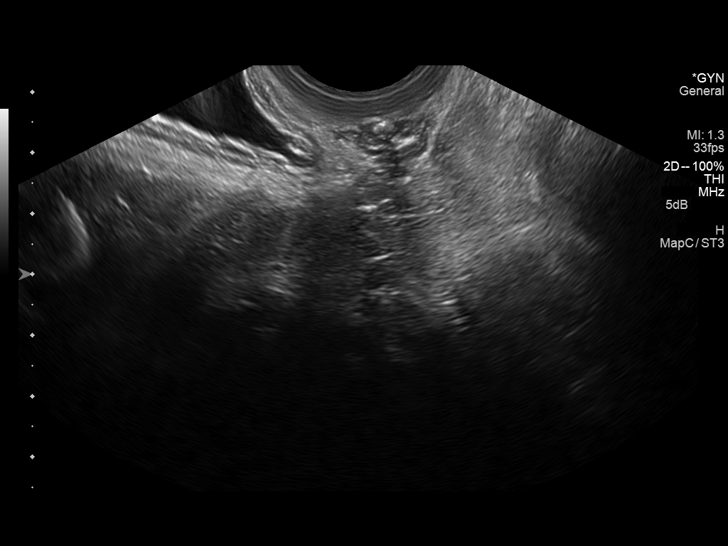
[im 14/41]
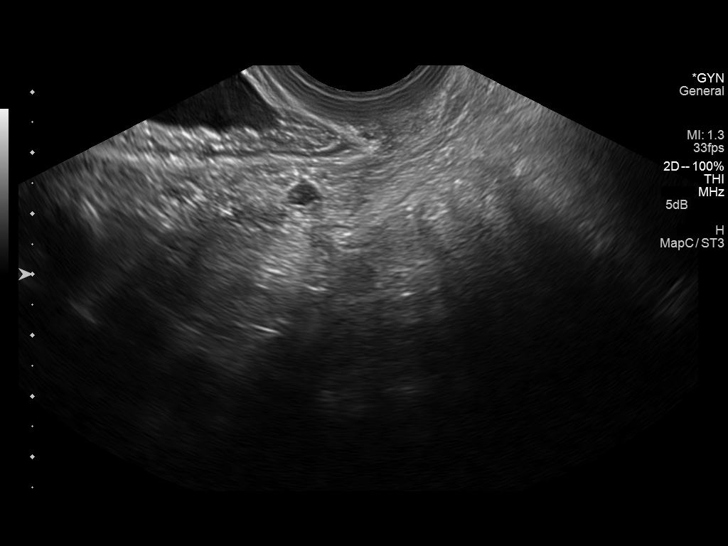
[im 16/41]
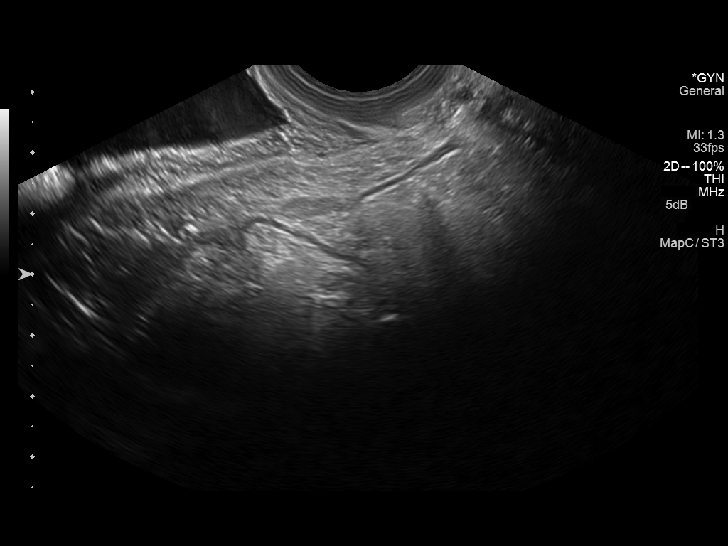
[im 19/41]
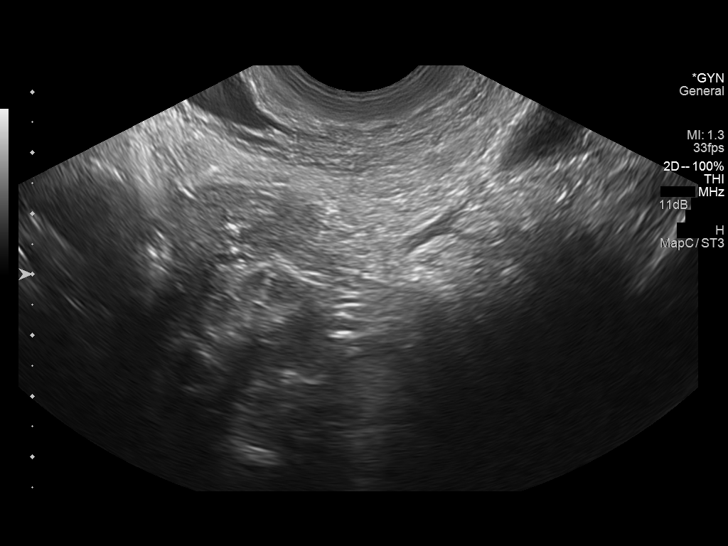
[im 22/41]
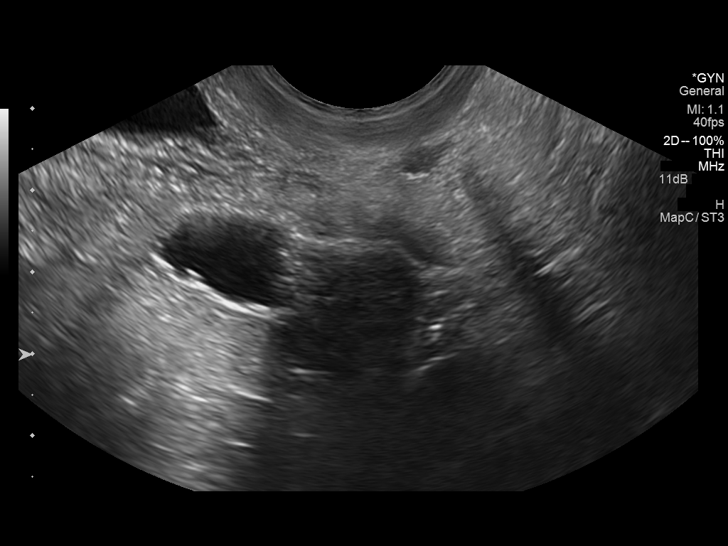
[im 26/41]
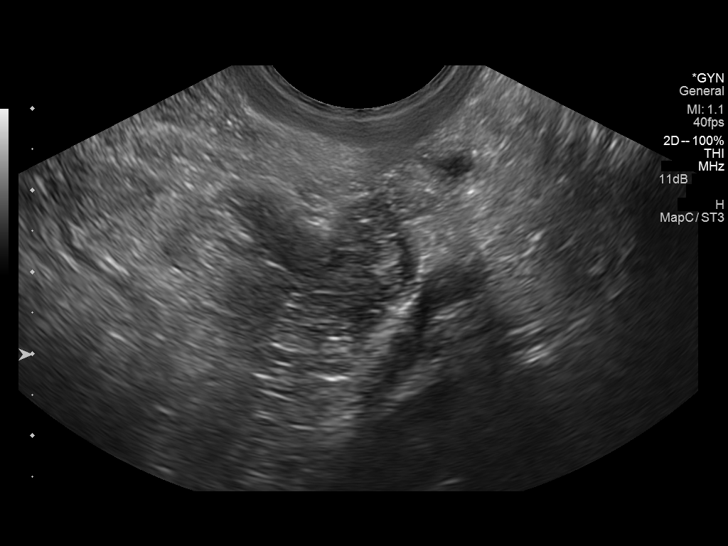
[im 27/41]
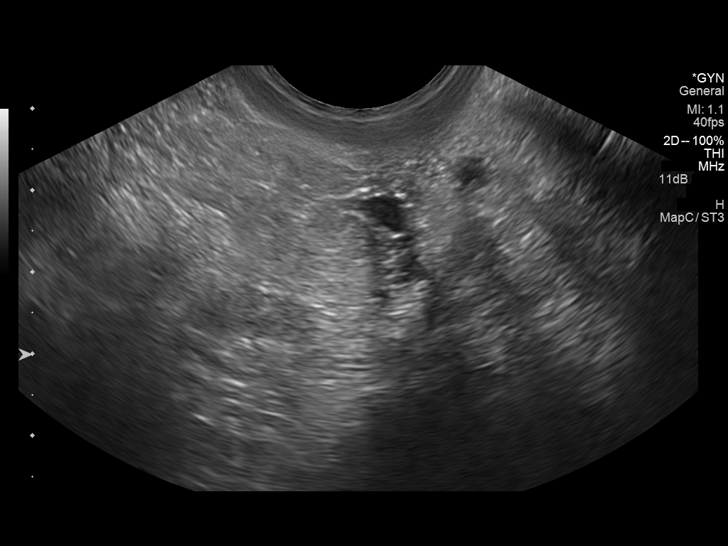
[im 31/41]
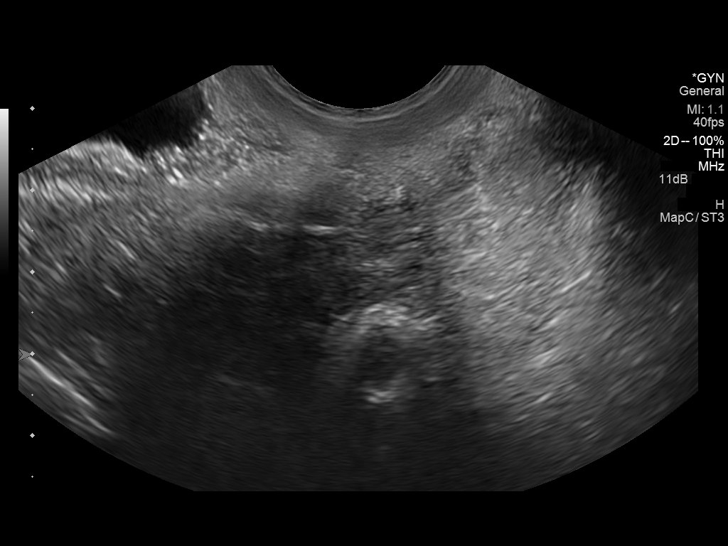
[im 34/41]
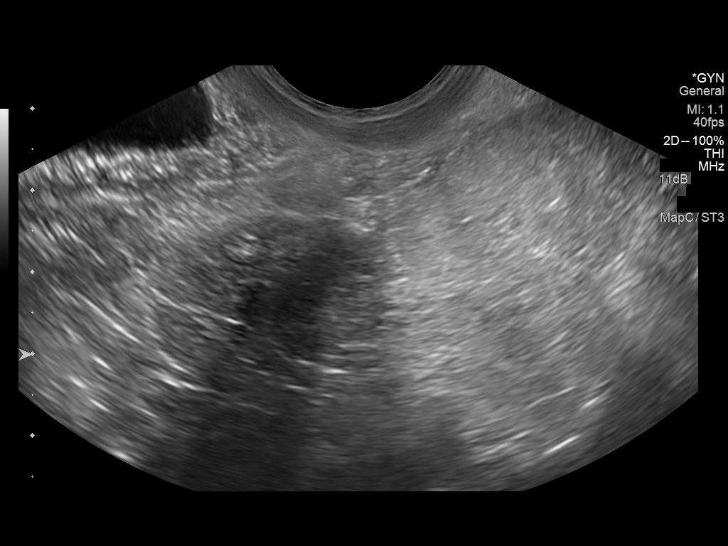
[im 37/41]
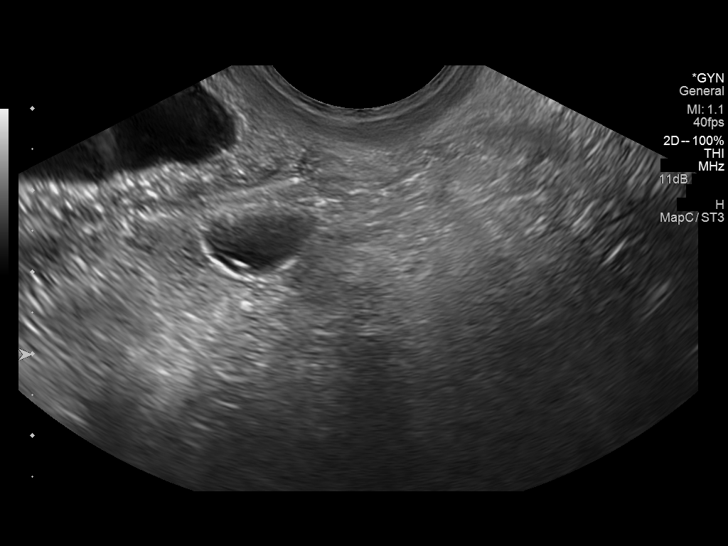
[im 41/41]
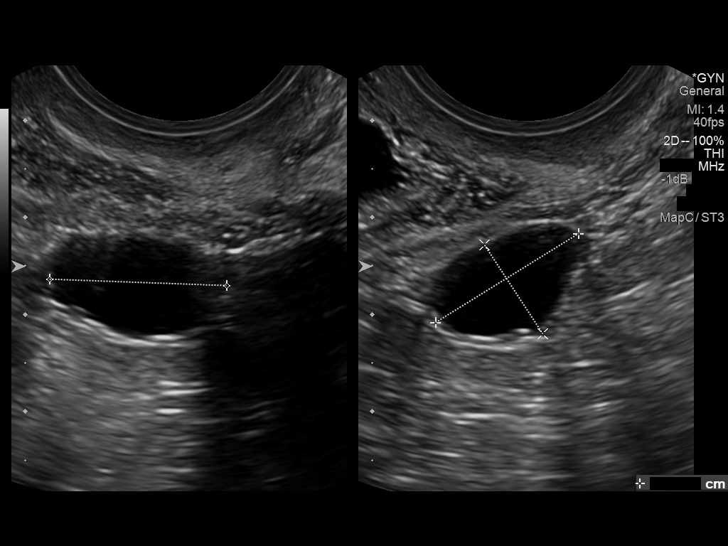

[14 of 25 positions shown; findings below may reference images not displayed]

FINDINGS: Uterus:  Prior hysterectomy.

Endometrium: n/a

Right ovary: Not visualized. Patient reports prior oophorectomy. No
adnexal mass.

Left ovary: 2.7 x 1.9 x 1.7 cm. Again noted adjacent to the left
ovary is a cystic structure which measures 1.8 x 1.7 x 1.1 cm. This
compares with 1.9 x 1.9 x 0.9 cm previously. I suspect this
represents a benign parovarian cyst and is unchanged.

Other findings:  No free fluid
IMPRESSION: Stable benign appearing cystic structure in the left adnexa, likely
paraovarian cyst.

## 2014-11-04 NOTE — Telephone Encounter (Signed)
Spoke with patient and notified that rx was sent 12/4 and will be resent today

## 2014-11-13 ENCOUNTER — Encounter: Payer: Self-pay | Admitting: Family Medicine

## 2014-11-13 ENCOUNTER — Ambulatory Visit (INDEPENDENT_AMBULATORY_CARE_PROVIDER_SITE_OTHER): Payer: BC Managed Care – PPO | Admitting: Family Medicine

## 2014-11-13 VITALS — BP 150/88 | HR 108 | Resp 16

## 2014-11-13 DIAGNOSIS — I10 Essential (primary) hypertension: Secondary | ICD-10-CM

## 2014-11-13 DIAGNOSIS — N3090 Cystitis, unspecified without hematuria: Secondary | ICD-10-CM

## 2014-11-13 DIAGNOSIS — F4323 Adjustment disorder with mixed anxiety and depressed mood: Secondary | ICD-10-CM

## 2014-11-13 DIAGNOSIS — R7309 Other abnormal glucose: Secondary | ICD-10-CM

## 2014-11-13 DIAGNOSIS — R7303 Prediabetes: Secondary | ICD-10-CM

## 2014-11-13 DIAGNOSIS — N39490 Overflow incontinence: Secondary | ICD-10-CM | POA: Insufficient documentation

## 2014-11-13 DIAGNOSIS — J4540 Moderate persistent asthma, uncomplicated: Secondary | ICD-10-CM

## 2014-11-13 LAB — POCT URINALYSIS DIPSTICK
Bilirubin, UA: NEGATIVE
Glucose, UA: NEGATIVE
Ketones, UA: NEGATIVE
LEUKOCYTES UA: NEGATIVE
Nitrite, UA: NEGATIVE
PH UA: 5.5
PROTEIN UA: NEGATIVE
Spec Grav, UA: 1.01
Urobilinogen, UA: 0.2

## 2014-11-13 MED ORDER — AMLODIPINE BESYLATE 5 MG PO TABS: 5.0000 mg | ORAL_TABLET | Freq: Every day | ORAL | Status: DC

## 2014-11-13 MED ORDER — SOLIFENACIN SUCCINATE 10 MG PO TABS: 10.0000 mg | ORAL_TABLET | Freq: Every day | ORAL | Status: DC

## 2014-11-13 NOTE — Progress Notes (Signed)
   Subjective:    Patient ID: Leslie Lyons, female    DOB: 12/29/1960, 53 y.o.   MRN: [id number]  HPI States for past 1 month, she has been constantly wetting on herself has not  Been able to work consistently has not worked since Monday of this week, also states did not work Dec 1 or 2 because of this problem,and requests note to cover tiose days also Increase in anxiety and depressed of this disabling conditoon , accompanied by her spouse , extremely tearful, needing assistance to walk safely   Review of Systems See HPI Denies recent fever or chills. Denies sinus pressure, nasal congestion, ear pain or sore throat. Denies chest congestion, productive cough or wheezing. Denies chest pains, palpitations and leg swelling Denies abdominal pain, nausea, vomiting,diarrhea or constipation.   . Denies skin break down or rash.        Objective:   Physical Exam BP 150/88 mmHg  Pulse 108  Resp 16  SpO2 97% Patient alert and oriented and in no cardiopulmonary distress. Tearful, crying and anxious  HEENT: No facial asymmetry, EOMI,   oropharynx pink and moist.  Neck supple no JVD, no mass.  Chest: Clear to auscultation bilaterally.  CVS: S1, S2 no murmurs, no S3.Regular rate.  ABD: Soft non tender.   Ext: No edema  MS: Adequate ROM spine, shoulders, hips and knees.  Skin: Intact, no ulcerations or rash noted.    CNS: CN 2-12 intact, power,  normal throughout.no focal deficits noted.        Assessment & Plan:  Urinary incontinence, overflow Reports 1 month h/o marked deterioration in ability to function due to excessive incontinence. States she awakens  In the morning wet from head to toe in her urine, and has been unable to go to work for several days due to severity of her symptoms. Was referred in the past to urology as has had this complaint for over 1 year, states she is "now ready " for help CCUA in office negative for infection at visit. Trial of vesicare and  urology eval asap Work excuse also written  Essential hypertension Uncontrolled Increase amlodipine dose DASH diet and commitment to daily physical activity for a minimum of 30 minutes discussed and encouraged, as a part of hypertension management. The importance of attaining a healthy weight is also discussed.   Asthma in adult Currently stable, no med change  Prediabetes Patient educated about the importance of limiting  Carbohydrate intake , the need to commit to daily physical activity for a minimum of 30 minutes , and to commit weight loss. The fact that changes in all these areas will reduce or eliminate all together the development of diabetes is stressed.   Updated lab needed at/ before next visit.   Adjustment disorder with mixed anxiety and depressed mood Deteriorated. Pt extremely anxipous at visit, tearful and depressed, accompanied by her spouse. Has resisted therapy in the past, i am sure that she needs this, she is not suicidal or homicidal, however stressede financially, reports poor relationship with her spouse and still mourning death of a child  Cystitis UA is negative for infection nmo further testing indicated

## 2014-11-13 NOTE — Patient Instructions (Addendum)
F/u  In 4 weeks, cancel 11/27/2014  New medication starting today to help with urine  Work excuse for Dec 1 and 2 and also from 12/14 to return 11/19/2015  You are referred to urologist in [place] or [place] call tomorrow by mid day to find out appt info  HBa1C chem 7 today  BP is high, please increase amlodipine to 40m daily, oK to take TWO 2.5 mg tabs daily till done

## 2014-11-14 LAB — BASIC METABOLIC PANEL
BUN: 6 mg/dL (ref 6–23)
CHLORIDE: 101 meq/L (ref 96–112)
CO2: 27 mEq/L (ref 19–32)
Calcium: 9.9 mg/dL (ref 8.4–10.5)
Creat: 0.63 mg/dL (ref 0.50–1.10)
GLUCOSE: 110 mg/dL — AB (ref 70–99)
POTASSIUM: 4.2 meq/L (ref 3.5–5.3)
Sodium: 141 mEq/L (ref 135–145)

## 2014-11-14 LAB — HEMOGLOBIN A1C
Hgb A1c MFr Bld: 6.6 % — ABNORMAL HIGH (ref <5.7)
MEAN PLASMA GLUCOSE: 143 mg/dL — AB (ref <117)

## 2014-11-17 NOTE — Assessment & Plan Note (Signed)
Currently stable, no med change

## 2014-11-17 NOTE — Assessment & Plan Note (Signed)
Uncontrolled Increase amlodipine dose DASH diet and commitment to daily physical activity for a minimum of 30 minutes discussed and encouraged, as a part of hypertension management. The importance of attaining a healthy weight is also discussed.

## 2014-11-17 NOTE — Assessment & Plan Note (Signed)
Patient educated about the importance of limiting  Carbohydrate intake , the need to commit to daily physical activity for a minimum of 30 minutes , and to commit weight loss. The fact that changes in all these areas will reduce or eliminate all together the development of diabetes is stressed.   Updated lab needed at/ before next visit.

## 2014-11-17 NOTE — Assessment & Plan Note (Signed)
Deteriorated. Pt extremely anxipous at visit, tearful and depressed, accompanied by her spouse. Has resisted therapy in the past, i am sure that she needs this, she is not suicidal or homicidal, however stressede financially, reports poor relationship with her spouse and still mourning death of a child

## 2014-11-17 NOTE — Assessment & Plan Note (Signed)
Reports 1 month h/o marked deterioration in ability to function due to excessive incontinence. States she awakens  In the morning wet from head to toe in her urine, and has been unable to go to work for several days due to severity of her symptoms. Was referred in the past to urology as has had this complaint for over 1 year, states she is "now ready " for help CCUA in office negative for infection at visit. Trial of vesicare and urology eval asap Work excuse also written

## 2014-11-19 ENCOUNTER — Ambulatory Visit (INDEPENDENT_AMBULATORY_CARE_PROVIDER_SITE_OTHER): Payer: BC Managed Care – PPO | Admitting: Urology

## 2014-11-19 DIAGNOSIS — R32 Unspecified urinary incontinence: Secondary | ICD-10-CM

## 2014-11-27 ENCOUNTER — Ambulatory Visit: Payer: BC Managed Care – PPO | Admitting: Family Medicine

## 2014-12-01 DIAGNOSIS — N3090 Cystitis, unspecified without hematuria: Secondary | ICD-10-CM | POA: Insufficient documentation

## 2014-12-01 NOTE — Assessment & Plan Note (Signed)
UA is negative for infection nmo further testing indicated

## 2014-12-02 ENCOUNTER — Other Ambulatory Visit: Payer: Self-pay | Admitting: Family Medicine

## 2014-12-23 ENCOUNTER — Ambulatory Visit: Payer: BC Managed Care – PPO | Admitting: Family Medicine

## 2014-12-25 ENCOUNTER — Ambulatory Visit: Payer: Self-pay | Admitting: Family Medicine

## 2015-01-01 ENCOUNTER — Ambulatory Visit (INDEPENDENT_AMBULATORY_CARE_PROVIDER_SITE_OTHER): Payer: BLUE CROSS/BLUE SHIELD | Admitting: Family Medicine

## 2015-01-01 ENCOUNTER — Encounter: Payer: Self-pay | Admitting: Family Medicine

## 2015-01-01 ENCOUNTER — Other Ambulatory Visit: Payer: Self-pay

## 2015-01-01 VITALS — BP 130/80 | HR 98 | Resp 16 | Ht 63.0 in | Wt 212.0 lb

## 2015-01-01 DIAGNOSIS — J4540 Moderate persistent asthma, uncomplicated: Secondary | ICD-10-CM

## 2015-01-01 DIAGNOSIS — R7309 Other abnormal glucose: Secondary | ICD-10-CM

## 2015-01-01 DIAGNOSIS — Z1211 Encounter for screening for malignant neoplasm of colon: Secondary | ICD-10-CM

## 2015-01-01 DIAGNOSIS — R7303 Prediabetes: Secondary | ICD-10-CM

## 2015-01-01 DIAGNOSIS — I10 Essential (primary) hypertension: Secondary | ICD-10-CM

## 2015-01-01 DIAGNOSIS — E785 Hyperlipidemia, unspecified: Secondary | ICD-10-CM

## 2015-01-01 DIAGNOSIS — E669 Obesity, unspecified: Secondary | ICD-10-CM

## 2015-01-01 DIAGNOSIS — R42 Dizziness and giddiness: Secondary | ICD-10-CM

## 2015-01-01 DIAGNOSIS — R1110 Vomiting, unspecified: Secondary | ICD-10-CM | POA: Insufficient documentation

## 2015-01-01 DIAGNOSIS — N39490 Overflow incontinence: Secondary | ICD-10-CM

## 2015-01-01 LAB — HEMOCCULT GUIAC POC 1CARD (OFFICE)
Card #2 Fecal Occult Blod, POC: NEGATIVE
Card #3 Fecal Occult Blood, POC: NEGATIVE
Fecal Occult Blood, POC: NEGATIVE

## 2015-01-01 MED ORDER — METFORMIN HCL 500 MG PO TABS: 500.0000 mg | ORAL_TABLET | Freq: Two times a day (BID) | ORAL | Status: DC

## 2015-01-01 MED ORDER — TRAMADOL HCL 50 MG PO TABS: ORAL_TABLET | ORAL | Status: DC

## 2015-01-01 MED ORDER — PHENTERMINE HCL 37.5 MG PO TABS: 37.5000 mg | ORAL_TABLET | Freq: Every day | ORAL | Status: DC

## 2015-01-01 MED ORDER — PRAVASTATIN SODIUM 20 MG PO TABS: 20.0000 mg | ORAL_TABLET | Freq: Every day | ORAL | Status: DC

## 2015-01-01 MED ORDER — MECLIZINE HCL 12.5 MG PO TABS: 12.5000 mg | ORAL_TABLET | Freq: Three times a day (TID) | ORAL | Status: DC | PRN

## 2015-01-01 MED ORDER — ALPRAZOLAM 1 MG PO TABS: ORAL_TABLET | ORAL | Status: DC

## 2015-01-01 NOTE — Progress Notes (Signed)
Subjective:    Patient ID: Leslie Lyons, female    DOB: [birth year], 54 y.o.   MRN: [id number]  HPI Pt reports waking up with her head spinning, could not walk or stand on 12/26/2014, states she vomited as well No prior history of this. No h/o recent viral illness Much better though still symptomatic Has not worked from 01/28 to today and work note. No call was made into office with her symptoms , this is the first time she is being evaluated for this. States she still feels the need of medication due to imbalance an she works in a [employer]. Will keep her out for an additional  Days, and prescribe symptomatic med. I also explained that if symptoms had not fully resolved by return to work dateshe should call in for referral to ENT Has gained benefit from [facility] for incontinence and has further eval upcoming Denies any current or recent asthma flare   Review of Systems    See HPI Denies recent fever or chills. Denies sinus pressure, nasal congestion, ear pain or sore throat. Denies chest congestion, productive cough or wheezing. Denies chest pains, palpitations and leg swelling Denies abdominal pain, diarrhea or constipation.   Denies dysuria, frequency, hesitancy or incontinence. Denies joint pain, swelling and limitation in mobility. Denies headaches, seizures, numbness, or tingling. Denies uncontrolled depression, anxiety or insomnia. Denies skin break down or rash.     Objective:   Physical Exam BP 130/80 mmHg  Pulse 98  Resp 16  Ht 5' 3"  (1.6 m)  Wt 212 lb (96.163 kg)  BMI 37.56 kg/m2  SpO2 97%  Patient alert and oriented and in no cardiopulmonary distress.Anxious   HEENT: No facial asymmetry, EOMI,   oropharynx pink and moist.  Neck supple no JVD, no mass. No nystagmus  Chest: Clear to auscultation bilaterally.  CVS: S1, S2 no murmurs, no S3.Regular rate.  ABD: Soft non tender. Normal bS  Ext: No edema  MS: Adequate ROM spine, shoulders, hips  and knees.  Skin: Intact, no ulcerations or rash noted.  Psych: Good eye contact, n. Memory intact  anxious , pressure of speech, not depressed appearing.  CNS: CN 2-12 intact, power,  normal throughout.no focal deficits noted.        Assessment & Plan:  Acute onset of vertigo with vomiting and inability to stand Acute onset 12/26/2014 initial episode. First clinical eval is on day of visit, reports improved but not totally, work excuse to return 01/06/2015 and script for antivert, start date is day of visit for work note If remains symptomatic, needs ENT eval   Urinary incontinence, overflow Improved with medication, being evaluated by urology   Essential hypertension Controlled, no change in medication DASH diet and commitment to daily physical activity for a minimum of 30 minutes discussed and encouraged, as a part of hypertension management. The importance of attaining a healthy weight is also discussed.    Asthma in adult Controlled, no change in medication No current or recent flare    Prediabetes Patient educated about the importance of limiting  Carbohydrate intake , the need to commit to daily physical activity for a minimum of 30 minutes , and to commit weight loss. The fact that changes in all these areas will reduce or eliminate all together the development of diabetes is stressed.      Hyperlipidemia LDL goal <100 Controlled, no change in medication Hyperlipidemia:Low fat diet discussed and encouraged.  Updated lab needed at/ before next visit.  Special screening for malignant neoplasms, colon Stool cards returned for testing, negative FOB

## 2015-01-01 NOTE — Patient Instructions (Signed)
F/u in 3 month, call if you need me before  Antivert is prescribed for acute inner ear,. Since you are still symptomatic, I recommend that you take medication prescribes, and return to work with no restrictions on 01/06/2015  IF YOU STILL FEEL SICK YOU NEED TO CALL THE OFFICE. I WILL REFER YOU TO ENT.  BP is good  Pls schedule your mammogram, this is past due  Work note will start from today to return on 01/06/2015 with no restrictions Note will also state that you report symtoms as starting on 12/26/2014

## 2015-01-04 DIAGNOSIS — Z1211 Encounter for screening for malignant neoplasm of colon: Secondary | ICD-10-CM | POA: Insufficient documentation

## 2015-01-04 NOTE — Assessment & Plan Note (Signed)
Controlled, no change in medication No current or recent flare

## 2015-01-04 NOTE — Assessment & Plan Note (Signed)
Improved with medication, being evaluated by urology

## 2015-01-04 NOTE — Assessment & Plan Note (Signed)
Acute onset 12/26/2014 initial episode. First clinical eval is on day of visit, reports improved but not totally, work excuse to return 01/06/2015 and script for antivert, start date is day of visit for work note If remains symptomatic, needs ENT eval

## 2015-01-04 NOTE — Assessment & Plan Note (Signed)
Stool cards returned for testing, negative FOB

## 2015-01-04 NOTE — Assessment & Plan Note (Signed)
Controlled, no change in medication DASH diet and commitment to daily physical activity for a minimum of 30 minutes discussed and encouraged, as a part of hypertension management. The importance of attaining a healthy weight is also discussed.  

## 2015-01-04 NOTE — Assessment & Plan Note (Signed)
Controlled, no change in medication Hyperlipidemia:Low fat diet discussed and encouraged.  Updated lab needed at/ before next visit.

## 2015-01-04 NOTE — Assessment & Plan Note (Signed)
Patient educated about the importance of limiting  Carbohydrate intake , the need to commit to daily physical activity for a minimum of 30 minutes , and to commit weight loss. The fact that changes in all these areas will reduce or eliminate all together the development of diabetes is stressed.

## 2015-01-07 ENCOUNTER — Ambulatory Visit: Payer: BLUE CROSS/BLUE SHIELD | Admitting: Urology

## 2015-01-29 ENCOUNTER — Other Ambulatory Visit: Payer: Self-pay | Admitting: Family Medicine

## 2015-02-18 ENCOUNTER — Ambulatory Visit (INDEPENDENT_AMBULATORY_CARE_PROVIDER_SITE_OTHER): Payer: BLUE CROSS/BLUE SHIELD | Admitting: Urology

## 2015-02-18 DIAGNOSIS — N3942 Incontinence without sensory awareness: Secondary | ICD-10-CM | POA: Diagnosis not present

## 2015-02-18 DIAGNOSIS — R32 Unspecified urinary incontinence: Secondary | ICD-10-CM

## 2015-03-01 ENCOUNTER — Other Ambulatory Visit: Payer: Self-pay | Admitting: Family Medicine

## 2015-03-08 ENCOUNTER — Other Ambulatory Visit: Payer: Self-pay | Admitting: Family Medicine

## 2015-03-31 ENCOUNTER — Other Ambulatory Visit: Payer: Self-pay | Admitting: Family Medicine

## 2015-04-02 ENCOUNTER — Other Ambulatory Visit: Payer: Self-pay

## 2015-04-02 DIAGNOSIS — I10 Essential (primary) hypertension: Secondary | ICD-10-CM

## 2015-04-02 MED ORDER — AMLODIPINE BESYLATE 5 MG PO TABS: 5.0000 mg | ORAL_TABLET | Freq: Every day | ORAL | Status: DC

## 2015-05-01 ENCOUNTER — Other Ambulatory Visit: Payer: Self-pay | Admitting: Family Medicine

## 2015-05-27 ENCOUNTER — Ambulatory Visit: Payer: BLUE CROSS/BLUE SHIELD | Admitting: Urology

## 2015-05-30 ENCOUNTER — Other Ambulatory Visit: Payer: Self-pay | Admitting: Family Medicine

## 2015-06-04 ENCOUNTER — Other Ambulatory Visit: Payer: Self-pay

## 2015-06-04 MED ORDER — ALPRAZOLAM 1 MG PO TABS: ORAL_TABLET | ORAL | Status: DC

## 2015-06-17 ENCOUNTER — Ambulatory Visit (INDEPENDENT_AMBULATORY_CARE_PROVIDER_SITE_OTHER): Payer: BLUE CROSS/BLUE SHIELD | Admitting: Urology

## 2015-06-17 DIAGNOSIS — N3281 Overactive bladder: Secondary | ICD-10-CM | POA: Diagnosis not present

## 2015-06-17 DIAGNOSIS — R32 Unspecified urinary incontinence: Secondary | ICD-10-CM

## 2015-06-17 DIAGNOSIS — N3942 Incontinence without sensory awareness: Secondary | ICD-10-CM

## 2015-07-06 ENCOUNTER — Other Ambulatory Visit: Payer: Self-pay | Admitting: Family Medicine

## 2015-07-10 ENCOUNTER — Ambulatory Visit: Payer: BLUE CROSS/BLUE SHIELD | Admitting: Family Medicine

## 2015-07-29 ENCOUNTER — Ambulatory Visit (INDEPENDENT_AMBULATORY_CARE_PROVIDER_SITE_OTHER): Payer: BLUE CROSS/BLUE SHIELD | Admitting: Urology

## 2015-07-29 DIAGNOSIS — R32 Unspecified urinary incontinence: Secondary | ICD-10-CM

## 2015-07-29 DIAGNOSIS — N3281 Overactive bladder: Secondary | ICD-10-CM

## 2015-08-12 ENCOUNTER — Ambulatory Visit: Payer: BLUE CROSS/BLUE SHIELD | Admitting: Family Medicine

## 2015-08-12 ENCOUNTER — Encounter: Payer: Self-pay | Admitting: *Deleted

## 2015-08-15 ENCOUNTER — Other Ambulatory Visit: Payer: Self-pay | Admitting: Family Medicine

## 2015-08-17 ENCOUNTER — Other Ambulatory Visit: Payer: Self-pay | Admitting: Family Medicine

## 2015-08-19 ENCOUNTER — Ambulatory Visit: Payer: BLUE CROSS/BLUE SHIELD | Admitting: Urology

## 2015-09-03 ENCOUNTER — Telehealth: Payer: Self-pay

## 2015-09-03 MED ORDER — PREDNISONE 5 MG (21) PO TBPK: ORAL_TABLET | ORAL | Status: DC

## 2015-09-03 NOTE — Telephone Encounter (Signed)
pls send prednisone dose pack , advise her if breathing not good enough to work need to go to UC/ ED as I am out of office I will sign work excuse based on established dx , the fact that she has active FMLA form, , excuse her for stated dates pls. I need to sign before midday  Thanks

## 2015-09-03 NOTE — Telephone Encounter (Signed)
Patient aware.  Will have someone come to collect work note.   Medication sent to pharmacy

## 2015-09-03 NOTE — Addendum Note (Signed)
Addended by: Denman George B on: 09/03/2015 01:04 PM   Modules accepted: Orders

## 2015-09-23 ENCOUNTER — Ambulatory Visit: Payer: BLUE CROSS/BLUE SHIELD | Admitting: Urology

## 2015-09-23 ENCOUNTER — Other Ambulatory Visit: Payer: Self-pay | Admitting: Family Medicine

## 2015-10-02 ENCOUNTER — Other Ambulatory Visit: Payer: Self-pay | Admitting: Family Medicine

## 2015-10-02 DIAGNOSIS — Z1231 Encounter for screening mammogram for malignant neoplasm of breast: Secondary | ICD-10-CM

## 2015-10-06 ENCOUNTER — Other Ambulatory Visit: Payer: Self-pay

## 2015-10-06 DIAGNOSIS — I10 Essential (primary) hypertension: Secondary | ICD-10-CM

## 2015-10-06 MED ORDER — AMLODIPINE BESYLATE 5 MG PO TABS: 5.0000 mg | ORAL_TABLET | Freq: Every day | ORAL | Status: DC

## 2015-10-15 ENCOUNTER — Ambulatory Visit (HOSPITAL_COMMUNITY)
Admission: RE | Admit: 2015-10-15 | Discharge: 2015-10-15 | Disposition: A | Discharge status: Home / Self Care | Payer: BLUE CROSS/BLUE SHIELD | Source: Ambulatory Visit | Attending: Family Medicine | Admitting: Family Medicine

## 2015-10-15 DIAGNOSIS — Z1231 Encounter for screening mammogram for malignant neoplasm of breast: Secondary | ICD-10-CM | POA: Diagnosis present

## 2015-10-18 ENCOUNTER — Other Ambulatory Visit: Payer: Self-pay | Admitting: Family Medicine

## 2015-10-23 ENCOUNTER — Other Ambulatory Visit: Payer: Self-pay | Admitting: Family Medicine

## 2015-11-17 ENCOUNTER — Ambulatory Visit (INDEPENDENT_AMBULATORY_CARE_PROVIDER_SITE_OTHER): Payer: BLUE CROSS/BLUE SHIELD | Admitting: Family Medicine

## 2015-11-17 ENCOUNTER — Encounter: Payer: Self-pay | Admitting: Family Medicine

## 2015-11-17 VITALS — BP 126/80 | HR 100 | Resp 18 | Ht 63.0 in | Wt 228.0 lb

## 2015-11-17 DIAGNOSIS — E785 Hyperlipidemia, unspecified: Secondary | ICD-10-CM

## 2015-11-17 DIAGNOSIS — J4540 Moderate persistent asthma, uncomplicated: Secondary | ICD-10-CM

## 2015-11-17 DIAGNOSIS — E559 Vitamin D deficiency, unspecified: Secondary | ICD-10-CM

## 2015-11-17 DIAGNOSIS — R7303 Prediabetes: Secondary | ICD-10-CM

## 2015-11-17 DIAGNOSIS — I10 Essential (primary) hypertension: Secondary | ICD-10-CM

## 2015-11-17 DIAGNOSIS — F4323 Adjustment disorder with mixed anxiety and depressed mood: Secondary | ICD-10-CM

## 2015-11-17 DIAGNOSIS — Z1159 Encounter for screening for other viral diseases: Secondary | ICD-10-CM

## 2015-11-17 DIAGNOSIS — R32 Unspecified urinary incontinence: Secondary | ICD-10-CM

## 2015-11-17 DIAGNOSIS — Z114 Encounter for screening for human immunodeficiency virus [HIV]: Secondary | ICD-10-CM

## 2015-11-17 DIAGNOSIS — Z23 Encounter for immunization: Secondary | ICD-10-CM | POA: Diagnosis not present

## 2015-11-17 DIAGNOSIS — J4520 Mild intermittent asthma, uncomplicated: Secondary | ICD-10-CM

## 2015-11-17 MED ORDER — FLUTICASONE PROPIONATE 50 MCG/ACT NA SUSP: NASAL | Status: DC

## 2015-11-17 MED ORDER — MONTELUKAST SODIUM 10 MG PO TABS: 10.0000 mg | ORAL_TABLET | Freq: Every day | ORAL | Status: DC

## 2015-11-17 MED ORDER — METFORMIN HCL 500 MG PO TABS: 500.0000 mg | ORAL_TABLET | Freq: Two times a day (BID) | ORAL | Status: DC

## 2015-11-17 MED ORDER — PRAVASTATIN SODIUM 20 MG PO TABS: 20.0000 mg | ORAL_TABLET | Freq: Every day | ORAL | Status: DC

## 2015-11-17 MED ORDER — ALBUTEROL SULFATE HFA 108 (90 BASE) MCG/ACT IN AERS: 2.0000 | INHALATION_SPRAY | Freq: Four times a day (QID) | RESPIRATORY_TRACT | Status: DC | PRN

## 2015-11-17 MED ORDER — BUDESONIDE-FORMOTEROL FUMARATE 160-4.5 MCG/ACT IN AERO: 2.0000 | INHALATION_SPRAY | Freq: Two times a day (BID) | RESPIRATORY_TRACT | Status: DC

## 2015-11-17 MED ORDER — IPRATROPIUM-ALBUTEROL 0.5-2.5 (3) MG/3ML IN SOLN: 3.0000 mL | Freq: Four times a day (QID) | RESPIRATORY_TRACT | Status: DC | PRN

## 2015-11-17 MED ORDER — ALPRAZOLAM 1 MG PO TABS: ORAL_TABLET | ORAL | Status: DC

## 2015-11-17 MED ORDER — AMLODIPINE BESYLATE 5 MG PO TABS: 5.0000 mg | ORAL_TABLET | Freq: Every day | ORAL | Status: DC

## 2015-11-17 NOTE — Assessment & Plan Note (Signed)
Controlled, no change in medication DASH diet and commitment to daily physical activity for a minimum of 30 minutes discussed and encouraged, as a part of hypertension management. The importance of attaining a healthy weight is also discussed.  BP/Weight 11/17/2015 01/01/2015 11/13/2014 07/22/2014 05/14/2014 04/16/2014 0/16/4290  Systolic BP 379 558 316 742 552 589 483  Diastolic BP 80 80 88 82 80 82 80  Wt. (Lbs) 228 212 - 206.8 205.12 204.04 208.04  BMI 40.4 37.56 - 37.81 37.51 37.31 38.04

## 2015-11-17 NOTE — Assessment & Plan Note (Signed)
Recent flare necessitating her being sent from work to urgent care in November, treated with both steroids and levaquin, improved, but unable to afford maintenance and rescue meds as she should, will attempt to get her on a program through the health dept for the assistance which she needs

## 2015-11-17 NOTE — Assessment & Plan Note (Signed)
Reports no response to medications , botox offered but unable to afford, uses 2 tabsfor optimal success, however has multiple undergarments on constantly and still soaks them

## 2015-11-17 NOTE — Assessment & Plan Note (Signed)
Hyperlipidemia:Low fat diet discussed and encouraged.   Lipid Panel  Lab Results  Component Value Date   CHOL 182 05/14/2014   HDL 55 05/14/2014   LDLCALC 99 05/14/2014   TRIG 141 05/14/2014   CHOLHDL 3.3 05/14/2014      Updated lab needed at/ before next visit.

## 2015-11-17 NOTE — Assessment & Plan Note (Signed)
Patient educated about the importance of limiting  Carbohydrate intake , the need to commit to daily physical activity for a minimum of 30 minutes , and to commit weight loss. The fact that changes in all these areas will reduce or eliminate all together the development of diabetes is stressed.   Diabetic Labs Latest Ref Rng 11/13/2014 05/14/2014 07/05/2013 12/12/2012 12/22/2011  HbA1c <5.7 % 6.6(H) 6.3(H) 6.1(H) - 5.9(H)  Chol 0 - 200 mg/dL - 182 235(H) - -  HDL >39 mg/dL - 55 47 - -  Calc LDL 0 - 99 mg/dL - 99 153(H) - -  Triglycerides <150 mg/dL - 141 175(H) - -  Creatinine 0.50 - 1.10 mg/dL 0.63 0.74 0.83 0.76 0.76   BP/Weight 11/17/2015 01/01/2015 11/13/2014 07/22/2014 05/14/2014 04/16/2014 6/70/1100  Systolic BP 349 611 643 539 122 583 462  Diastolic BP 80 80 88 82 80 82 80  Wt. (Lbs) 228 212 - 206.8 205.12 204.04 208.04  BMI 40.4 37.56 - 37.81 37.51 37.31 38.04   No flowsheet data found.   Updated lab needed at/ before next visit.

## 2015-11-17 NOTE — Assessment & Plan Note (Signed)
Continues to remain stressed and overwhelmed financial stress with ill spouse who remains unemployed. Refuses antidepressant , denies suicidal or homicidal ideation, states she will go to hospital for help if worsens

## 2015-11-17 NOTE — Patient Instructions (Addendum)
Annual physical exam in 4 month, call [id number] need me sooner  Medication is being sent in   We will work with you to try to get help for your medications which you need  Fasdting labs are WAY past due please get tomorroqw as promised, Hep C screen, HIV, lipid, cmp and EGFr, hBA1C, CBC, tSH and vit D  Hopefully your situation will improve soon  Thanks for choosing [facility], we consider it a privelige to serve you.   Flu vaccine today

## 2015-11-17 NOTE — Progress Notes (Signed)
Subjective:    Patient ID: Leslie Lyons, female    DOB: [birth year], 54 y.o.   MRN: [id number]  HPI   Leslie Lyons     MRN: [id number]      DOB: [birth year]   HPI Leslie Lyons is here for follow up and re-evaluation of chronic medical conditions, medication management and review of any available recent lab and radiology data.  Preventive health is updated, specifically  Cancer screening and Immunization.   Questions or concerns regarding consultations or procedures which the PT has had in the interim are  addressed. The PT denies any adverse reactions to current medications since the last visit.  There are no new concerns.  There are no specific complaints   ROS Denies recent fever or chills. Denies sinus pressure, nasal congestion, ear pain or sore throat. Denies chest congestion, productive cough or wheezing. Denies chest pains, palpitations and leg swelling Denies abdominal pain, nausea, vomiting,diarrhea or constipation.   Denies dysuria, frequency, hesitancy or incontinence. Denies joint pain, swelling and limitation in mobility. Denies headaches, seizures, numbness, or tingling. Denies depression, anxiety or insomnia. Denies skin break down or rash.   PE  BP 126/80 mmHg  Pulse 100  Resp 18  Ht 5' 3"  (1.6 m)  Wt 228 lb (103.42 kg)  BMI 40.40 kg/m2  SpO2 90%  Patient alert and oriented and in no cardiopulmonary distress.  HEENT: No facial asymmetry, EOMI,   oropharynx pink and moist.  Neck supple no JVD, no mass.  Chest: Clear to auscultation bilaterally.  CVS: S1, S2 no murmurs, no S3.Regular rate.  ABD: Soft non tender.   Ext: No edema  MS: Adequate ROM spine, shoulders, hips and knees.  Skin: Intact, no ulcerations or rash noted.  Psych: Good eye contact, tearful affect intact both anxious and  depressed appearing.  CNS: CN 2-12 intact, power,  normal throughout.no focal deficits noted.   Assessment & Plan   Essential  hypertension Controlled, no change in medication DASH diet and commitment to daily physical activity for a minimum of 30 minutes discussed and encouraged, as a part of hypertension management. The importance of attaining a healthy weight is also discussed.  BP/Weight 11/17/2015 01/01/2015 11/13/2014 07/22/2014 05/14/2014 04/16/2014 04/06/3266  Systolic BP 124 580 998 338 250 539 767  Diastolic BP 80 80 88 82 80 82 80  Wt. (Lbs) 228 212 - 206.8 205.12 204.04 208.04  BMI 40.4 37.56 - 37.81 37.51 37.31 38.04        Asthma in adult Recent flare necessitating her being sent from work to urgent care in November, treated with both steroids and levaquin, improved, but unable to afford maintenance and rescue meds as she should, will attempt to get her on a program through the health dept for the assistance which she needs  Prediabetes Patient educated about the importance of limiting  Carbohydrate intake , the need to commit to daily physical activity for a minimum of 30 minutes , and to commit weight loss. The fact that changes in all these areas will reduce or eliminate all together the development of diabetes is stressed.   Diabetic Labs Latest Ref Rng 11/13/2014 05/14/2014 07/05/2013 12/12/2012 12/22/2011  HbA1c <5.7 % 6.6(H) 6.3(H) 6.1(H) - 5.9(H)  Chol 0 - 200 mg/dL - 182 235(H) - -  HDL >39 mg/dL - 55 47 - -  Calc LDL 0 - 99 mg/dL - 99 153(H) - -  Triglycerides <150 mg/dL - 141 175(H) - -  Creatinine 0.50 -  1.10 mg/dL 0.63 0.74 0.83 0.76 0.76   BP/Weight 11/17/2015 01/01/2015 11/13/2014 07/22/2014 05/14/2014 04/16/2014 1/96/2229  Systolic BP 798 921 194 174 081 448 185  Diastolic BP 80 80 88 82 80 82 80  Wt. (Lbs) 228 212 - 206.8 205.12 204.04 208.04  BMI 40.4 37.56 - 37.81 37.51 37.31 38.04   No flowsheet data found.   Updated lab needed at/ before next visit.   Hyperlipidemia LDL goal <100 Hyperlipidemia:Low fat diet discussed and encouraged.   Lipid Panel  Lab Results  Component  Value Date   CHOL 182 05/14/2014   HDL 55 05/14/2014   LDLCALC 99 05/14/2014   TRIG 141 05/14/2014   CHOLHDL 3.3 05/14/2014      Updated lab needed at/ before next visit.   Adjustment disorder with mixed anxiety and depressed mood Continues to remain stressed and overwhelmed financial stress with ill spouse who remains unemployed. Refuses antidepressant , denies suicidal or homicidal ideation, states she will go to hospital for help if worsens  Urinary incontinence Reports no response to medications , botox offered but unable to afford, uses 2 tabsfor optimal success, however has multiple undergarments on constantly and still soaks them       Review of Systems     Objective:   Physical Exam        Assessment & Plan:

## 2015-12-02 ENCOUNTER — Other Ambulatory Visit: Payer: Self-pay | Admitting: Family Medicine

## 2016-02-20 ENCOUNTER — Other Ambulatory Visit: Payer: Self-pay

## 2016-02-20 MED ORDER — TRAMADOL HCL 50 MG PO TABS: ORAL_TABLET | ORAL | Status: DC

## 2016-02-26 ENCOUNTER — Ambulatory Visit: Payer: BLUE CROSS/BLUE SHIELD | Admitting: Family Medicine

## 2016-03-15 ENCOUNTER — Telehealth: Payer: Self-pay | Admitting: Family Medicine

## 2016-03-15 NOTE — Telephone Encounter (Signed)
Left message for patient to return call.

## 2016-03-15 NOTE — Telephone Encounter (Signed)
Patient is calling asking to be seen today, she states that her Asthma is out of  Control, and she has the Possible Flu, please advise?

## 2016-03-15 NOTE — Telephone Encounter (Signed)
Patient had been scheduled for tomorrow afternoon

## 2016-03-16 ENCOUNTER — Ambulatory Visit (INDEPENDENT_AMBULATORY_CARE_PROVIDER_SITE_OTHER): Payer: BLUE CROSS/BLUE SHIELD | Admitting: Family Medicine

## 2016-03-16 ENCOUNTER — Encounter: Payer: Self-pay | Admitting: Family Medicine

## 2016-03-16 VITALS — BP 160/90 | HR 120 | Resp 18 | Ht 63.0 in | Wt 222.0 lb

## 2016-03-16 DIAGNOSIS — E785 Hyperlipidemia, unspecified: Secondary | ICD-10-CM

## 2016-03-16 DIAGNOSIS — E559 Vitamin D deficiency, unspecified: Secondary | ICD-10-CM

## 2016-03-16 DIAGNOSIS — Z114 Encounter for screening for human immunodeficiency virus [HIV]: Secondary | ICD-10-CM

## 2016-03-16 DIAGNOSIS — J4540 Moderate persistent asthma, uncomplicated: Secondary | ICD-10-CM

## 2016-03-16 DIAGNOSIS — E669 Obesity, unspecified: Secondary | ICD-10-CM

## 2016-03-16 DIAGNOSIS — Z1159 Encounter for screening for other viral diseases: Secondary | ICD-10-CM

## 2016-03-16 DIAGNOSIS — I10 Essential (primary) hypertension: Secondary | ICD-10-CM

## 2016-03-16 DIAGNOSIS — R7303 Prediabetes: Secondary | ICD-10-CM

## 2016-03-16 MED ORDER — METHYLPREDNISOLONE ACETATE 80 MG/ML IJ SUSP
80.0000 mg | Freq: Once | INTRAMUSCULAR | Status: AC
  Administered 2016-03-16: 80 mg via INTRAMUSCULAR

## 2016-03-16 NOTE — Progress Notes (Signed)
   Subjective:    Patient ID: Leslie Lyons, female    DOB: [birth year], 55 y.o.   MRN: [id number]  HPI Pt in today stating that last week Wednesday on the job, she was exposed to an excessive amt of chlorox, states she started wheezing on the job, states she left after 4 hours because of wheezing  Called yesterday asking to return to work, but needing to be re evaluated prior to her return Has been out of her medication for past 4 days, as her sister "accidentally" took them from her home to [place], returns later this week Denies chest pain, PND, palpitations or orthopnea   Review of Systems See HPI Denies recent fever or chills. Denies sinus pressure, nasal congestion, ear pain or sore throat. Denies abdominal pain, nausea, , vomiting or loose stool, no constipation. c/o incontinence. Denies joint pain, swelling and limitation in mobility. Denies headaches, seizures, numbness, or tingling. Denies depression, anxiety or insomnia. Denies skin break down or rash.        Objective:   Physical Exam BP 160/90 mmHg  Pulse 120  Resp 18  Ht 5' 3"  (1.6 m)  Wt 222 lb (100.699 kg)  BMI 39.34 kg/m2  SpO2 95% Patient alert and oriented and in no cardiopulmonary distress.  HEENT: No facial asymmetry, EOMI,   oropharynx pink and moist.  Neck supple no JVD, no mass.  Chest: adequate air entry throughout, few scattered wheezes, no crackles  CVS: S1, S2 no murmurs, no S3.Regular rate.  ABD: Soft non tender.   Ext: No edema  MS: Adequate ROM spine, shoulders, hips and knees.  Skin: Intact, no ulcerations or rash noted.  Psych: Good eye contact, normal affect. Memory intact not anxious or depressed appearing.  CNS: CN 2-12 intact, power,  normal throughout.no focal deficits noted.        Assessment & Plan:   Essential hypertension Uncontrolled, does not have her medication currently, importance of taking meds daily is stressed DASH diet and commitment to daily physical  activity for a minimum of 30 minutes discussed and encouraged, as a part of hypertension management. The importance of attaining a healthy weight is also discussed.  BP/Weight 03/16/2016 11/17/2015 01/01/2015 11/13/2014 07/22/2014 05/14/2014 02/15/2333  Systolic BP 356 861 683 729 021 115 520  Diastolic BP 90 80 80 88 82 80 82  Wt. (Lbs) 222 228 212 - 206.8 205.12 204.04  BMI 39.34 40.4 37.56 - 37.81 37.51 37.31        Prediabetes Updated lab needed at/ before next visit.   Asthma in adult Acute flare per pt history, improving , prednisone dose pack prescribed, and work excuse provided to cover absence reported by the patient  Obesity (BMI 30-39.9) Improved. Patient re-educated about  the importance of commitment to a  minimum of 150 minutes of exercise per week.  The importance of healthy food choices with portion control discussed. Encouraged to start a food diary, count calories and to consider  joining a support group. Sample diet sheets offered. Goals set by the patient for the next several months.   Weight /BMI 03/16/2016 11/17/2015 01/01/2015  WEIGHT 222 lb 228 lb 212 lb  HEIGHT 5' 3"  5' 3"  5' 3"   BMI 39.34 kg/m2 40.4 kg/m2 37.56 kg/m2    Current exercise per week 90 minutes.

## 2016-03-16 NOTE — Patient Instructions (Addendum)
Physical exam in May as before  Please get labs as discussed, new sheet given today  Depo medrol in office and prednisone dose pack sent Return to work tomorrow, starting from last Wednesday2:45 pm  Congrats on smoking cesation  Thank you  for choosing [facility]. We consider it a privelige to serve you.  Delivering excellent health care in a caring and  compassionate way is our goal.  Partnering with you,  so that together we can achieve this goal is our strategy.

## 2016-03-17 NOTE — Assessment & Plan Note (Signed)
Acute flare per pt history, improving , prednisone dose pack prescribed, and work excuse provided to cover absence reported by the patient

## 2016-03-17 NOTE — Assessment & Plan Note (Signed)
Updated lab needed at/ before next visit.   

## 2016-03-17 NOTE — Assessment & Plan Note (Signed)
Improved. Patient re-educated about  the importance of commitment to a  minimum of 150 minutes of exercise per week.  The importance of healthy food choices with portion control discussed. Encouraged to start a food diary, count calories and to consider  joining a support group. Sample diet sheets offered. Goals set by the patient for the next several months.   Weight /BMI 03/16/2016 11/17/2015 01/01/2015  WEIGHT 222 lb 228 lb 212 lb  HEIGHT 5' 3"  5' 3"  5' 3"   BMI 39.34 kg/m2 40.4 kg/m2 37.56 kg/m2    Current exercise per week 90 minutes.

## 2016-03-17 NOTE — Assessment & Plan Note (Signed)
Uncontrolled, does not have her medication currently, importance of taking meds daily is stressed DASH diet and commitment to daily physical activity for a minimum of 30 minutes discussed and encouraged, as a part of hypertension management. The importance of attaining a healthy weight is also discussed.  BP/Weight 03/16/2016 11/17/2015 01/01/2015 11/13/2014 07/22/2014 05/14/2014 6/57/8469  Systolic BP 629 528 413 244 010 272 536  Diastolic BP 90 80 80 88 82 80 82  Wt. (Lbs) 222 228 212 - 206.8 205.12 204.04  BMI 39.34 40.4 37.56 - 37.81 37.51 37.31

## 2016-03-30 ENCOUNTER — Encounter: Payer: Self-pay | Admitting: Family Medicine

## 2016-03-30 ENCOUNTER — Other Ambulatory Visit (HOSPITAL_COMMUNITY)
Admission: RE | Admit: 2016-03-30 | Discharge: 2016-03-30 | Disposition: A | Discharge status: Home / Self Care | Payer: BLUE CROSS/BLUE SHIELD | Source: Ambulatory Visit | Attending: Family Medicine | Admitting: Family Medicine

## 2016-03-30 ENCOUNTER — Ambulatory Visit (INDEPENDENT_AMBULATORY_CARE_PROVIDER_SITE_OTHER): Payer: BLUE CROSS/BLUE SHIELD | Admitting: Family Medicine

## 2016-03-30 VITALS — BP 130/84 | HR 101 | Resp 16 | Ht 63.0 in | Wt 218.0 lb

## 2016-03-30 DIAGNOSIS — I889 Nonspecific lymphadenitis, unspecified: Secondary | ICD-10-CM | POA: Diagnosis not present

## 2016-03-30 DIAGNOSIS — Z01419 Encounter for gynecological examination (general) (routine) without abnormal findings: Secondary | ICD-10-CM | POA: Diagnosis not present

## 2016-03-30 DIAGNOSIS — F4323 Adjustment disorder with mixed anxiety and depressed mood: Secondary | ICD-10-CM

## 2016-03-30 DIAGNOSIS — Z0289 Encounter for other administrative examinations: Secondary | ICD-10-CM

## 2016-03-30 DIAGNOSIS — Z0000 Encounter for general adult medical examination without abnormal findings: Secondary | ICD-10-CM | POA: Diagnosis not present

## 2016-03-30 DIAGNOSIS — Z1211 Encounter for screening for malignant neoplasm of colon: Secondary | ICD-10-CM

## 2016-03-30 DIAGNOSIS — Z124 Encounter for screening for malignant neoplasm of cervix: Secondary | ICD-10-CM

## 2016-03-30 LAB — POC HEMOCCULT BLD/STL (OFFICE/1-CARD/DIAGNOSTIC): Fecal Occult Blood, POC: NEGATIVE

## 2016-03-30 MED ORDER — HYDROXYZINE HCL 25 MG PO TABS: ORAL_TABLET | ORAL | Status: DC

## 2016-03-30 MED ORDER — ALPRAZOLAM 1 MG PO TABS
ORAL_TABLET | ORAL | Status: DC
Start: 2016-03-30 — End: 2016-05-12

## 2016-03-30 MED ORDER — TRAMADOL HCL 50 MG PO TABS: ORAL_TABLET | ORAL | Status: DC

## 2016-03-30 MED ORDER — SOLIFENACIN SUCCINATE 10 MG PO TABS: 10.0000 mg | ORAL_TABLET | Freq: Every day | ORAL | Status: DC

## 2016-03-30 MED ORDER — MECLIZINE HCL 12.5 MG PO TABS: ORAL_TABLET | ORAL | Status: DC

## 2016-03-30 MED ORDER — TRAZODONE HCL 50 MG PO TABS: 50.0000 mg | ORAL_TABLET | Freq: Every day | ORAL | Status: DC

## 2016-03-30 NOTE — Assessment & Plan Note (Addendum)
Oral anti inflammatories for 5 days, right inguinal region affected, no evidence of infection on exam Call back if persists or worsens

## 2016-03-30 NOTE — Assessment & Plan Note (Signed)
Depression with anxiety, not suicidal or homicidal, refuses though would benfit from psych input. Start trazodone, f/u in 3 months

## 2016-03-30 NOTE — Assessment & Plan Note (Signed)

## 2016-03-30 NOTE — Progress Notes (Signed)
   Subjective:    Patient ID: Leslie Lyons, female    DOB: 08-23-1961, 55 y.o.   MRN: [id number]  HPI  Patient is in for annual physical exam. Labs still needed, cites financial distress.  Immunization is reviewed , and  updated if needed. C/o uncontrolled depression and chronic fatigue, wants to get on disability.Has multiple medical conditions which could qualify her for this, and I have directed her to Social security office Refuses psych treatment states it was not beneficial in the past. Not actively suicidal or homicidal   Review of Systems See HPI      Objective:   Physical Exam  BP 130/84 mmHg  Pulse 101  Resp 16  Ht 5' 3"  (1.6 m)  Wt 218 lb (98.884 kg)  BMI 38.63 kg/m2  SpO2 98% Pleasant obese female, alert and oriented x 3, in no cardio-pulmonary distress. Afebrile. HEENT No facial trauma or asymetry. Sinuses non tender.  Extra occullar muscles intact, pupils equally reactive to light. External ears normal, tympanic membranes clear. Oropharynx moist, no exudate, poor dentition. Neck: supple, no adenopathy,JVD or thyromegaly.No bruits.  Chest: Clear to ascultation bilaterally.No crackles or wheezes. Non tender to palpation  Breast: No asymetry,no masses or lumps. No tenderness. No nipple discharge or inversion. No axillary or supraclavicular adenopathy  Cardiovascular system; Heart sounds normal,  S1 and  S2 ,no S3.  No murmur, or thrill. Apical beat not displaced Peripheral pulses normal.  Abdomen: Soft, non tender, no organomegaly or masses. No bruits. Bowel sounds normal. No guarding, tenderness or rebound.  Rectal:  Normal sphincter tone. Hemorrhoids present  Guaiac negative stool.  GU: External genitalia normal female genitalia , female distribution of hair. No lesions. Urethral meatus normal in size, no  Prolapse, no lesions visibly  Present. Bladder non tender. Vagina pink and moist , with no visible lesions , discharge present  . Adequate pelvic support no  cystocele or rectocele noted  Uterus absent, no adnexal masses, no  adnexal tenderness. Mildly tender right inguinal node, no warmth or erythema  Adequate though reduced  ROM of spine, adequate in hips , shoulders and knees. No deformity ,swelling or crepitus noted. No muscle wasting or atrophy.   Neurologic: Cranial nerves 2 to 12 intact. Power, tone ,sensation and reflexes normal throughout. No disturbance in gait. No tremor.  Skin: Intact, no ulceration, erythema , scaling or rash noted. Pigmentation normal throughout  Psych; Normal mood and affect. Judgement and concentration normal Positive depression screen as documented     Assessment & Plan:  Annual physical exam Annual exam as documented. Counseling done  re healthy lifestyle involving commitment to 150 minutes exercise per week, heart healthy diet, and attaining healthy weight.The importance of adequate sleep also discussed. Regular seat belt use and home safety, is also discussed. Changes in health habits are decided on by the patient with goals and time frames  set for achieving them. Immunization and cancer screening needs are specifically addressed at this visit.   Adenitis Oral anti inflammatories for 5 days, right inguinal region affected, no evidence of infection on exam Call back if persists or worsens  Adjustment disorder with mixed anxiety and depressed mood Depression with anxiety, not suicidal or homicidal, refuses though would benfit from psych input. Start trazodone, f/u in 3 months

## 2016-03-30 NOTE — Patient Instructions (Signed)
F/u in 3 month, call if you need me sooner  New for depression is trazodone one at bedtime  Take alleve one twice daily for 5 days for inflamed tender lymph gland on inner thigh, right, call if not better  Please work on good  health habits so that your health will improve. 1. Commitment to daily physical activity for 30 to 60  minutes, if you are able to do this.  2. Commitment to wise food choices. Aim for half of your  food intake to be vegetable and fruit, one quarter starchy foods, and one quarter protein. Try to eat on a regular schedule  3 meals per day, snacking between meals should be limited to vegetables or fruits or small portions of nuts. 64 ounces of water per day is generally recommended, unless you have specific health conditions, like heart failure or kidney failure where you will need to limit fluid intake.  3. Commitment to sufficient and a  good quality of physical and mental rest daily, generally between 6 to 8 hours per day.  WITH PERSISTANCE AND PERSEVERANCE, THE IMPOSSIBLE , BECOMES THE NORM!   Thank you  for choosing [facility]. We consider it a privelige to serve you.  Delivering excellent health care in a caring and  compassionate way is our goal.  Partnering with you,  so that together we can achieve this goal is our strategy.   Labs in next 1 to 2 weeks as you have promised please!

## 2016-03-31 LAB — CYTOLOGY - PAP

## 2016-05-12 ENCOUNTER — Other Ambulatory Visit: Payer: Self-pay

## 2016-05-12 LAB — HEMOGLOBIN A1C
HEMOGLOBIN A1C: 6.6 % — AB (ref <5.7)
MEAN PLASMA GLUCOSE: 143 mg/dL

## 2016-05-12 MED ORDER — ALPRAZOLAM 1 MG PO TABS: ORAL_TABLET | ORAL | Status: DC

## 2016-05-12 MED ORDER — TRAMADOL HCL 50 MG PO TABS: ORAL_TABLET | ORAL | Status: DC

## 2016-05-13 LAB — LIPID PANEL
CHOLESTEROL: 231 mg/dL — AB (ref 125–200)
HDL: 75 mg/dL (ref >=46)
LDL CALC: 100 mg/dL (ref <130)
TRIGLYCERIDES: 279 mg/dL — AB (ref <150)
Total CHOL/HDL Ratio: 3.1 Ratio (ref <=5.0)
VLDL: 56 mg/dL — ABNORMAL HIGH (ref <30)

## 2016-05-13 LAB — COMPLETE METABOLIC PANEL WITH GFR
ALBUMIN: 4.5 g/dL (ref 3.6–5.1)
ALK PHOS: 69 U/L (ref 33–130)
ALT: 62 U/L — ABNORMAL HIGH (ref 6–29)
AST: 46 U/L — ABNORMAL HIGH (ref 10–35)
BILIRUBIN TOTAL: 0.3 mg/dL (ref 0.2–1.2)
BUN: 11 mg/dL (ref 7–25)
CALCIUM: 9.9 mg/dL (ref 8.6–10.4)
CO2: 29 mmol/L (ref 20–31)
Chloride: 96 mmol/L — ABNORMAL LOW (ref 98–110)
Creat: 0.76 mg/dL (ref 0.50–1.05)
GFR, EST NON AFRICAN AMERICAN: 89 mL/min (ref >=60)
Glucose, Bld: 95 mg/dL (ref 65–99)
Potassium: 4.3 mmol/L (ref 3.5–5.3)
Sodium: 141 mmol/L (ref 135–146)
TOTAL PROTEIN: 8 g/dL (ref 6.1–8.1)

## 2016-05-13 LAB — HIV ANTIBODY (ROUTINE TESTING W REFLEX): HIV 1&2 Ab, 4th Generation: NONREACTIVE

## 2016-05-13 LAB — CBC
HCT: 39.7 % (ref 35.0–45.0)
Hemoglobin: 12.7 g/dL (ref 11.7–15.5)
MCH: 27.9 pg (ref 27.0–33.0)
MCHC: 32 g/dL (ref 32.0–36.0)
MCV: 87.3 fL (ref 80.0–100.0)
MPV: 8.5 fL (ref 7.5–12.5)
RBC: 4.55 MIL/uL (ref 3.80–5.10)
RDW: 15.4 % — ABNORMAL HIGH (ref 11.0–15.0)
WBC: 9.4 10*3/uL (ref 3.8–10.8)

## 2016-05-13 LAB — HEPATITIS C ANTIBODY: HCV Ab: NEGATIVE

## 2016-05-13 LAB — VITAMIN D 25 HYDROXY (VIT D DEFICIENCY, FRACTURES): Vit D, 25-Hydroxy: 18 ng/mL — ABNORMAL LOW (ref 30–100)

## 2016-05-13 LAB — TSH: TSH: 2.75 m[IU]/L

## 2016-05-14 ENCOUNTER — Other Ambulatory Visit: Payer: Self-pay

## 2016-05-17 ENCOUNTER — Other Ambulatory Visit: Payer: Self-pay

## 2016-05-17 MED ORDER — VITAMIN D (ERGOCALCIFEROL) 1.25 MG (50000 UNIT) PO CAPS: 50000.0000 [IU] | ORAL_CAPSULE | ORAL | Status: DC

## 2016-05-25 ENCOUNTER — Ambulatory Visit: Payer: BLUE CROSS/BLUE SHIELD | Admitting: Urology

## 2016-06-21 ENCOUNTER — Other Ambulatory Visit: Payer: Self-pay

## 2016-06-21 MED ORDER — MECLIZINE HCL 12.5 MG PO TABS: ORAL_TABLET | ORAL | 0 refills | Status: DC

## 2016-06-29 ENCOUNTER — Ambulatory Visit: Payer: BLUE CROSS/BLUE SHIELD | Admitting: Urology

## 2016-06-30 ENCOUNTER — Other Ambulatory Visit: Payer: Self-pay

## 2016-06-30 ENCOUNTER — Telehealth: Payer: Self-pay | Admitting: Family Medicine

## 2016-06-30 MED ORDER — FLUTICASONE PROPIONATE 50 MCG/ACT NA SUSP: NASAL | 1 refills | Status: DC

## 2016-06-30 NOTE — Telephone Encounter (Signed)
Leslie Lyons is asking for a refill on her fluticasone (FLONASE) 50 MCG/ACT nasal spray please advise? r

## 2016-06-30 NOTE — Telephone Encounter (Signed)
Med sent in.

## 2016-07-01 ENCOUNTER — Ambulatory Visit: Payer: BLUE CROSS/BLUE SHIELD | Admitting: Family Medicine

## 2016-07-05 ENCOUNTER — Encounter (INDEPENDENT_AMBULATORY_CARE_PROVIDER_SITE_OTHER): Payer: Self-pay

## 2016-07-05 ENCOUNTER — Ambulatory Visit (INDEPENDENT_AMBULATORY_CARE_PROVIDER_SITE_OTHER): Payer: BLUE CROSS/BLUE SHIELD | Admitting: Family Medicine

## 2016-07-05 ENCOUNTER — Encounter: Payer: Self-pay | Admitting: Family Medicine

## 2016-07-05 VITALS — BP 144/90 | HR 108 | Resp 16 | Ht 63.0 in | Wt 236.0 lb

## 2016-07-05 DIAGNOSIS — Z23 Encounter for immunization: Secondary | ICD-10-CM

## 2016-07-05 DIAGNOSIS — N182 Chronic kidney disease, stage 2 (mild): Secondary | ICD-10-CM

## 2016-07-05 DIAGNOSIS — I10 Essential (primary) hypertension: Secondary | ICD-10-CM

## 2016-07-05 DIAGNOSIS — E119 Type 2 diabetes mellitus without complications: Secondary | ICD-10-CM | POA: Diagnosis not present

## 2016-07-05 DIAGNOSIS — E1169 Type 2 diabetes mellitus with other specified complication: Secondary | ICD-10-CM

## 2016-07-05 DIAGNOSIS — N39490 Overflow incontinence: Secondary | ICD-10-CM | POA: Diagnosis not present

## 2016-07-05 DIAGNOSIS — R748 Abnormal levels of other serum enzymes: Secondary | ICD-10-CM | POA: Diagnosis not present

## 2016-07-05 DIAGNOSIS — E1122 Type 2 diabetes mellitus with diabetic chronic kidney disease: Secondary | ICD-10-CM | POA: Insufficient documentation

## 2016-07-05 DIAGNOSIS — E785 Hyperlipidemia, unspecified: Secondary | ICD-10-CM

## 2016-07-05 DIAGNOSIS — F4323 Adjustment disorder with mixed anxiety and depressed mood: Secondary | ICD-10-CM

## 2016-07-05 DIAGNOSIS — J4530 Mild persistent asthma, uncomplicated: Secondary | ICD-10-CM

## 2016-07-05 DIAGNOSIS — Z794 Long term (current) use of insulin: Secondary | ICD-10-CM

## 2016-07-05 DIAGNOSIS — E669 Obesity, unspecified: Secondary | ICD-10-CM

## 2016-07-05 MED ORDER — AMLODIPINE BESYLATE 10 MG PO TABS: 10.0000 mg | ORAL_TABLET | Freq: Every day | ORAL | 5 refills | Status: DC

## 2016-07-05 MED ORDER — OXYBUTYNIN CHLORIDE ER 10 MG PO TB24: 10.0000 mg | ORAL_TABLET | Freq: Every day | ORAL | 4 refills | Status: DC

## 2016-07-05 NOTE — Assessment & Plan Note (Addendum)
Uncontrolled and unable to afford meds, script provided for generic med, she is to call back for additonal help with meds through assistance program once her insurance runs out, unemployed x 3 months

## 2016-07-05 NOTE — Patient Instructions (Signed)
F/u in 4 month, call if you need me sooner  Normal foot exam today Pneumonia 23 and microalb today  You are referred for eye exam  Tablet is printed for incontinence, check cost at different pharn[facility]ies, no $4 option listed at [facility], sorry  STOP ice cream  NEED to lose weight , liver enzymes increased, need AST and ALT repeated , blood test so you can resume cholesterol med  BP is still high, increase amlodipine to 10 mg daily, oK to take two 66m tablets daily tiill done

## 2016-07-05 NOTE — Assessment & Plan Note (Signed)
Controlled, no change in medication Leslie Lyons is reminded of the importance of commitment to daily physical activity for 30 minutes or more, as able and the need to limit carbohydrate intake to 30 to 60 grams per meal to help with blood sugar control.   The need to take medication as prescribed, test blood sugar as directed, and to call between visits if there is a concern that blood sugar is uncontrolled is also discussed.   Leslie Lyons is reminded of the importance of daily foot exam, annual eye examination, and good blood sugar, blood pressure and cholesterol control.  Diabetic Labs Latest Ref Rng & Units 05/12/2016 11/13/2014 05/14/2014 07/05/2013 12/12/2012  HbA1c <5.7 % 6.6(H) 6.6(H) 6.3(H) 6.1(H) -  Chol 125 - 200 mg/dL 231(H) - 182 235(H) -  HDL >=46 mg/dL 75 - 55 47 -  Calc LDL <130 mg/dL 100 - 99 153(H) -  Triglycerides <150 mg/dL 279(H) - 141 175(H) -  Creatinine 0.50 - 1.05 mg/dL 0.76 0.63 0.74 0.83 0.76   BP/Weight 07/05/2016 03/30/2016 03/16/2016 11/17/2015 01/01/2015 11/13/2014 4/31/5400  Systolic BP 867 619 509 326 712 458 099  Diastolic BP 90 84 90 80 80 88 82  Wt. (Lbs) 236 218 222 228 212 - 206.8  BMI 41.81 38.63 39.34 40.4 37.56 - 37.81   No flowsheet data found.

## 2016-07-05 NOTE — Assessment & Plan Note (Signed)
Uncontrolled, inc amlodipine DASH diet and commitment to daily physical activity for a minimum of 30 minutes discussed and encouraged, as a part of hypertension management. The importance of attaining a healthy weight is also discussed.  BP/Weight 07/05/2016 03/30/2016 03/16/2016 11/17/2015 01/01/2015 11/13/2014 1/65/5374  Systolic BP 827 078 675 449 201 007 121  Diastolic BP 90 84 90 80 80 88 82  Wt. (Lbs) 236 218 222 228 212 - 206.8  BMI 41.81 38.63 39.34 40.4 37.56 - 37.81

## 2016-07-11 DIAGNOSIS — Z23 Encounter for immunization: Secondary | ICD-10-CM | POA: Insufficient documentation

## 2016-07-11 NOTE — Assessment & Plan Note (Signed)
Controlled, no change in medication  

## 2016-07-11 NOTE — Progress Notes (Signed)
Leslie Lyons     MRN: [id number]      DOB: 05-28-1961   HPI Leslie Lyons is here for follow up and re-evaluation of chronic medical conditions, medication management and review of any available recent lab and radiology data.  Preventive health is updated, specifically  Cancer screening and Immunization.   The PT denies any adverse reactions to current medications since the last visit.unable to afford med for incontinence, reports being unemployed x 3 months because of this and her other chronic medical problems  There are no new concerns.  There are no specific complaints   ROS Denies recent fever or chills. Denies sinus pressure, nasal congestion, ear pain or sore throat. Denies chest congestion, productive cough or wheezing. Denies chest pains, palpitations and leg swelling Denies abdominal pain, nausea, vomiting,diarrhea or constipation.   Denies dysuria,  Denies uncontrolled  joint pain, swelling and limitation in mobility. Denies headaches, seizures, numbness, or tingling. Denies uncontrolled depression, anxiety or insomnia. Denies skin break down or rash.   PE  BP (!) 144/90   Pulse (!) 108   Resp 16   Ht 5' 3"  (1.6 m)   Wt 236 lb (107 kg)   SpO2 96%   BMI 41.81 kg/m   Patient alert and oriented and in no cardiopulmonary distress.  HEENT: No facial asymmetry, EOMI,   oropharynx pink and moist.  Neck supple no JVD, no mass.  Chest: Clear to auscultation bilaterally.  CVS: S1, S2 no murmurs, no S3.Regular rate.  ABD: Soft non tender.   Ext: No edema  MS: Adequate though reduced  ROM spine, normal in shoulders, hips and knees.  Skin: Intact, no ulcerations or rash noted.  Psych: Good eye contact, normal affect. Memory intact not anxious or depressed appearing.  CNS: CN 2-12 intact, power,  normal throughout.no focal deficits noted.   Assessment & Plan  Diabetes mellitus type 2 in obese (HCC) Controlled, no change in medication Leslie Lyons is  reminded of the importance of commitment to daily physical activity for 30 minutes or more, as able and the need to limit carbohydrate intake to 30 to 60 grams per meal to help with blood sugar control.   The need to take medication as prescribed, test blood sugar as directed, and to call between visits if there is a concern that blood sugar is uncontrolled is also discussed.   Leslie Lyons is reminded of the importance of daily foot exam, annual eye examination, and good blood sugar, blood pressure and cholesterol control.  Diabetic Labs Latest Ref Rng & Units 05/12/2016 11/13/2014 05/14/2014 07/05/2013 12/12/2012  HbA1c <5.7 % 6.6(H) 6.6(H) 6.3(H) 6.1(H) -  Chol 125 - 200 mg/dL 231(H) - 182 235(H) -  HDL >=46 mg/dL 75 - 55 47 -  Calc LDL <130 mg/dL 100 - 99 153(H) -  Triglycerides <150 mg/dL 279(H) - 141 175(H) -  Creatinine 0.50 - 1.05 mg/dL 0.76 0.63 0.74 0.83 0.76   BP/Weight 07/05/2016 03/30/2016 03/16/2016 11/17/2015 01/01/2015 11/13/2014 06/04/2375  Systolic BP 283 151 761 607 371 062 694  Diastolic BP 90 84 90 80 80 88 82  Wt. (Lbs) 236 218 222 228 212 - 206.8  BMI 41.81 38.63 39.34 40.4 37.56 - 37.81   No flowsheet data found.      Essential hypertension Uncontrolled, inc amlodipine DASH diet and commitment to daily physical activity for a minimum of 30 minutes discussed and encouraged, as a part of hypertension management. The importance of attaining a healthy weight  is also discussed.  BP/Weight 07/05/2016 03/30/2016 03/16/2016 11/17/2015 01/01/2015 11/13/2014 07/26/33  Systolic BP 917 915 056 979 480 165 537  Diastolic BP 90 84 90 80 80 88 82  Wt. (Lbs) 236 218 222 228 212 - 206.8  BMI 41.81 38.63 39.34 40.4 37.56 - 37.81       Urinary incontinence, overflow Uncontrolled and unable to afford meds, script provided for generic med, she is to call back for additonal help with meds through assistance program once her insurance runs out, unemployed x 3 months  Hyperlipidemia LDL goal  <100 Hyperlipidemia:Low fat diet discussed and encouraged.   Lipid Panel  Lab Results  Component Value Date   CHOL 231 (H) 05/12/2016   HDL 75 05/12/2016   LDLCALC 100 05/12/2016   TRIG 279 (H) 05/12/2016   CHOLHDL 3.1 05/12/2016   Uncontrolled    Obesity (BMI 30-39.9) Deteriorated. Patient re-educated about  the importance of commitment to a  minimum of 150 minutes of exercise per week.  The importance of healthy food choices with portion control discussed. Encouraged to start a food diary, count calories and to consider  joining a support group. Sample diet sheets offered. Goals set by the patient for the next several months.   Weight /BMI 07/05/2016 03/30/2016 03/16/2016  WEIGHT 236 lb 218 lb 222 lb  HEIGHT 5' 3"  5' 3"  5' 3"   BMI 41.81 kg/m2 38.63 kg/m2 39.34 kg/m2      Adjustment disorder with mixed anxiety and depressed mood Uncontrolled, inadequately treated, desires no changes at this time. Not suicidal or homicidal  Asthma in adult Controlled, no change in medication   Need for 23-polyvalent pneumococcal polysaccharide vaccine After obtaining informed consent, the vaccine is  administered by LPN.

## 2016-07-11 NOTE — Assessment & Plan Note (Signed)
Uncontrolled, inadequately treated, desires no changes at this time. Not suicidal or homicidal

## 2016-07-11 NOTE — Assessment & Plan Note (Signed)
After obtaining informed consent, the vaccine is  administered by LPN.  

## 2016-07-11 NOTE — Assessment & Plan Note (Signed)
Hyperlipidemia:Low fat diet discussed and encouraged.   Lipid Panel  Lab Results  Component Value Date   CHOL 231 (H) 05/12/2016   HDL 75 05/12/2016   LDLCALC 100 05/12/2016   TRIG 279 (H) 05/12/2016   CHOLHDL 3.1 05/12/2016   Uncontrolled

## 2016-07-11 NOTE — Assessment & Plan Note (Signed)
Deteriorated. Patient re-educated about  the importance of commitment to a  minimum of 150 minutes of exercise per week.  The importance of healthy food choices with portion control discussed. Encouraged to start a food diary, count calories and to consider  joining a support group. Sample diet sheets offered. Goals set by the patient for the next several months.   Weight /BMI 07/05/2016 03/30/2016 03/16/2016  WEIGHT 236 lb 218 lb 222 lb  HEIGHT 5' 3"  5' 3"  5' 3"   BMI 41.81 kg/m2 38.63 kg/m2 39.34 kg/m2

## 2016-08-24 ENCOUNTER — Other Ambulatory Visit: Payer: Self-pay | Admitting: Family Medicine

## 2016-09-25 ENCOUNTER — Other Ambulatory Visit: Payer: Self-pay | Admitting: Family Medicine

## 2016-10-05 ENCOUNTER — Other Ambulatory Visit: Payer: Self-pay | Admitting: Family Medicine

## 2016-10-29 ENCOUNTER — Ambulatory Visit: Payer: BLUE CROSS/BLUE SHIELD | Admitting: Family Medicine

## 2016-11-05 ENCOUNTER — Ambulatory Visit: Payer: BLUE CROSS/BLUE SHIELD | Admitting: Family Medicine

## 2016-12-22 ENCOUNTER — Ambulatory Visit: Payer: BLUE CROSS/BLUE SHIELD | Admitting: Family Medicine

## 2017-06-16 DIAGNOSIS — Z139 Encounter for screening, unspecified: Secondary | ICD-10-CM

## 2017-06-16 LAB — GLUCOSE, POCT (MANUAL RESULT ENTRY): POC Glucose: 189 mg/dl — AB (ref 70–99)

## 2017-06-16 NOTE — Congregational Nurse Program (Signed)
Congregational Nurse Program Note  Date of Encounter: 06/16/2017  Past Medical History: Past Medical History:  Diagnosis Date  . Anxiety   . Bronchial asthma    with acute exacerbation  . Cancer ([place]) 1980   cervical, diag  at age 56  . Depression   . Headache(784.0)    with reduced vision in right eye for one month   . Hypertension   . Obesity     Encounter Details:     CNP Questionnaire - 06/16/17 1630      Patient Demographics   Is this a new or existing patient? New   Patient is considered a/an Not Applicable   Race African-American/Black     Patient Assistance   Location of Patient [place]   Patient's financial/insurance status Low Income;Self-Pay (Uninsured)   Patient referred to apply for the following financial assistance Not Applicable   Food insecurities addressed Referred to food bank or resource   Transportation assistance No   Assistance securing medications No   Educational Naval architect health;Hypertension;Diabetes;Chronic disease;Navigating the healthcare system;Spiritual care     Encounter Details   Primary purpose of visit Chronic Illness/Condition Visit;Education/Health Concerns;Navigating the Healthcare System   Was an Emergency Department visit averted? Not Applicable   Does patient have a medical provider? No   Patient referred to Area Agency;Clinic;Establish PCP;Other   Was a mental health screening completed? (GAINS tool) No   Does patient have dental issues? No   Does patient have vision issues? No   Does your patient have an abnormal blood pressure today? Yes   Since previous encounter, have you referred patient for abnormal blood pressure that resulted in a new diagnosis or medication change? No   Does your patient have an abnormal blood glucose today? Yes   Since previous encounter, have you referred patient for abnormal blood glucose that resulted in a new diagnosis or medication change? No   Was there a  life-saving intervention made? No     New Client to [facility]. Client currently lives with her husband and they are living in a place that friends are assisting with. Client states she has not seen a medical provider since last august 2017 and that was Dr. Moshe Cipro. 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 She is currently in the process of applying for disability, but has been denied Medicaid (verified on 06/16/17 by DSS), she only has family planning medicaid. Her husband is also unemployed and have not income into the household, except what support friends and family assist with. They do receive food stamps.  Past Surgical History: Partial Hysterectomy (states has one ovary that was left) Tumor ? 2000  Medical History Anxiety/panic attacks Asthma Cervical Cancer Back pain from degeneration? Vertigo Bladder incontinence Depression Diabetes  Hyperlipidemia Hypertension  Medications:  Albuterol inhaler (states she has)  Xanax 1 mg by mouth as needed (currently has none) Norvasc ( none for a while) Symbicort ( has she thinks) Flonase ( out) Atarax ( has none) duo neb solution ( does not have) Antivert ( out of) Metformin ( out of) Singulair (out of) Ditropan(out of) Tramadol ( has none) Trazodone ( has none) Vitamin D (drisdol) ( out of)  Alert and oriented to person and place. Answers questions appropriately. Wearing a surgical mask to prevent allergens from affecting her Asthma. Does not appear nervous. Client states she is in need of a medical provider while she is in the process of applying for disability. She  states she last saw Dr. Moshe Cipro 06/2016. She states she has been out of most her medications and tried to ration them out. She does report that she has some inhalers at home , but none of her nebulizer solution.  Lungs clear, slightly diminished breath sounds throughout. No wheezing noted. Respirations, non-labored at 13 , however it seems difficult for  client to speak loudly. Her room air oxygen saturation is 96%. Denies chest pains, denies being short of breath at this time. States she has lower back pain and leg pains, greater with right leg and that occasionally her right leg gives away. She is using a 4 prong cane to aid in mobility. Her gait is slow and deliberate. Positive pedal pulses noted bilaterally. Brisk capillary refill noted. Denies abdominal pain , but states she occasionally has problems with constipation. She also reports acid reflux symptoms at times. She states she has bladder incontinence and that she wears incontinence under garments to help with this. She states she goes through several a day and greater at night.  Vital signs today: First blood pressure upon arriving 173/103, pulse 103 and rapid but regular Blood pressure repeat 30 min after sitting more relaxed and less nervous 160/97 pulse 103 Fasting glucose 189 last ate at 0400  Plan: Verified Client does not have Adult Medicaid, she is only currently eligible for family planning medicaid . Verified with DSS worker.  Referral to [facility] appointment secured for 06/20/17 at 1:45 pm  Referral to Ubaldo Glassing MSW appointment 06/22/17 at 0900 at [facility] for further needs and risk assessment and referrals as needed.  RN counseled on no added salt in diet , handout education given for low salt diet along with my plate handout and diabetes information.  RN discussed were client to have onset of severe sudden headache, sudden vision changes, difficulty speaking or facial weakness, or weakness of arms or legs more than her normal to Call 911 and seek immediate medical assistance. Reviewed signs and symptoms of stroke and heart attack, client verbalizes understanding.  Reviewed with client appointment times, vitals signs today along with glucose reading, contact information for the free clinic and cancellation policy for the free clinic  discussed with client and she states understanding.  Will follow up with client after her initial Free Clinic appointment.

## 2017-06-20 ENCOUNTER — Ambulatory Visit: Payer: Self-pay | Admitting: Physician Assistant

## 2017-06-22 ENCOUNTER — Ambulatory Visit: Payer: Self-pay | Admitting: Physician Assistant

## 2017-06-22 ENCOUNTER — Encounter: Payer: Self-pay | Admitting: Physician Assistant

## 2017-06-22 VITALS — BP 160/96 | HR 100 | Temp 97.9°F | Ht 63.0 in | Wt 240.0 lb

## 2017-06-22 DIAGNOSIS — E1165 Type 2 diabetes mellitus with hyperglycemia: Principal | ICD-10-CM

## 2017-06-22 DIAGNOSIS — E559 Vitamin D deficiency, unspecified: Secondary | ICD-10-CM

## 2017-06-22 DIAGNOSIS — J45909 Unspecified asthma, uncomplicated: Secondary | ICD-10-CM

## 2017-06-22 DIAGNOSIS — R7989 Other specified abnormal findings of blood chemistry: Secondary | ICD-10-CM

## 2017-06-22 DIAGNOSIS — R945 Abnormal results of liver function studies: Secondary | ICD-10-CM

## 2017-06-22 DIAGNOSIS — M549 Dorsalgia, unspecified: Secondary | ICD-10-CM

## 2017-06-22 DIAGNOSIS — Z1239 Encounter for other screening for malignant neoplasm of breast: Secondary | ICD-10-CM

## 2017-06-22 DIAGNOSIS — G8929 Other chronic pain: Secondary | ICD-10-CM

## 2017-06-22 DIAGNOSIS — E785 Hyperlipidemia, unspecified: Secondary | ICD-10-CM

## 2017-06-22 DIAGNOSIS — R32 Unspecified urinary incontinence: Secondary | ICD-10-CM

## 2017-06-22 DIAGNOSIS — I10 Essential (primary) hypertension: Secondary | ICD-10-CM

## 2017-06-22 DIAGNOSIS — IMO0001 Reserved for inherently not codable concepts without codable children: Secondary | ICD-10-CM

## 2017-06-22 DIAGNOSIS — F329 Major depressive disorder, single episode, unspecified: Secondary | ICD-10-CM

## 2017-06-22 DIAGNOSIS — F32A Depression, unspecified: Secondary | ICD-10-CM

## 2017-06-22 MED ORDER — LISINOPRIL 10 MG PO TABS: 10.0000 mg | ORAL_TABLET | Freq: Every day | ORAL | 1 refills | Status: DC

## 2017-06-22 MED ORDER — ALBUTEROL SULFATE HFA 108 (90 BASE) MCG/ACT IN AERS: 2.0000 | INHALATION_SPRAY | Freq: Four times a day (QID) | RESPIRATORY_TRACT | 1 refills | Status: DC | PRN

## 2017-06-22 MED ORDER — METFORMIN HCL 500 MG PO TABS: 500.0000 mg | ORAL_TABLET | Freq: Two times a day (BID) | ORAL | 1 refills | Status: DC

## 2017-06-22 MED ORDER — MOMETASONE FURO-FORMOTEROL FUM 200-5 MCG/ACT IN AERO: 2.0000 | INHALATION_SPRAY | Freq: Two times a day (BID) | RESPIRATORY_TRACT | 1 refills | Status: DC

## 2017-06-22 MED ORDER — IPRATROPIUM-ALBUTEROL 0.5-2.5 (3) MG/3ML IN SOLN: 3.0000 mL | Freq: Four times a day (QID) | RESPIRATORY_TRACT | 1 refills | Status: DC | PRN

## 2017-06-22 MED ORDER — MOMETASONE FUROATE 50 MCG/ACT NA SUSP: 2.0000 | Freq: Every day | NASAL | 1 refills | Status: DC

## 2017-06-22 MED ORDER — TOLTERODINE TARTRATE ER 2 MG PO CP24: 2.0000 mg | ORAL_CAPSULE | Freq: Every day | ORAL | 1 refills | Status: DC

## 2017-06-22 NOTE — Patient Instructions (Addendum)
Get fasting labs drawn this week

## 2017-06-22 NOTE — Progress Notes (Signed)
BP (!) 160/96 (BP Location: Left Arm, Patient Position: Sitting, Cuff Size: Large)   Pulse 100   Temp 97.9 F (36.6 C) (Other (Comment))   Ht 5' 3"  (1.6 m)   Wt 240 lb (108.9 kg)   SpO2 96%   BMI 42.51 kg/m    Subjective:    Patient ID: Sharion Balloon, female    DOB: 1961/03/14, 56 y.o.   MRN: [id number]  HPI: CLEDA IMEL is a 56 y.o. female presenting on 06/22/2017 for New Patient (Initial Visit)   HPI   Pt previously seen by Dr Moshe Cipro but pt has lost her insurance.  She has family planning medicaid.   Pt has appt at [facility] upcoming soon  Pt Had colonoscopy 11/2008.  results reviewed- hemorrhoids but o/w normal with next due 12/2018  Pt went to specialist - dr Merlene Laughter - for her back in the past. She says something degenerative and something bulging.   Pt says she hasn't been there since about 2013. She says she just quit going because he was talking about surgery and she didn't want surgery.   Pt hasn't been to eye dr in years.   Pt has nebulizer machine but no medicine for it.  She says she gets SOB a lot.   Relevant past medical, surgical, family and social history reviewed and updated as indicated. Interim medical history since our last visit reviewed. Allergies and medications reviewed and updated.   Current Outpatient Prescriptions:  .  albuterol (PROAIR HFA) 108 (90 BASE) MCG/ACT inhaler, Inhale 2 puffs into the lungs every 6 (six) hours as needed. 2 puffs every 6 to 8 hours as needed, Disp: 24 g, Rfl: 3 .  budesonide-formoterol (SYMBICORT) 160-4.5 MCG/ACT inhaler, Inhale 2 puffs into the lungs 2 (two) times daily., Disp: 3 Inhaler, Rfl: 3 .  fluticasone (FLONASE) 50 MCG/ACT nasal spray, instill 1 to 2 sprays into each nostril once daily, Disp: 48 g, Rfl: 1 .  Naproxen Sod-Diphenhydramine (ALEVE PM PO), Take by mouth., Disp: , Rfl:  .  VITAMIN D, CHOLECALCIFEROL, PO, Take by mouth., Disp: , Rfl:  .  ALPRAZolam (XANAX) 1 MG tablet, take 1 tablet by mouth  twice a day if needed (Patient not taking: Reported on 06/22/2017), Disp: 60 tablet, Rfl: 0 .  amLODipine (NORVASC) 10 MG tablet, Take 1 tablet (10 mg total) by mouth daily. (Patient not taking: Reported on 06/22/2017), Disp: 30 tablet, Rfl: 5 .  hydrOXYzine (ATARAX/VISTARIL) 25 MG tablet, take 1-2 tablet at bedtime if needed (Patient not taking: Reported on 06/22/2017), Disp: 60 tablet, Rfl: 0 .  ipratropium-albuterol (DUONEB) 0.5-2.5 (3) MG/3ML SOLN, Take 3 mLs by nebulization every 6 (six) hours as needed. (Patient not taking: Reported on 06/22/2017), Disp: 360 mL, Rfl: 3 .  meclizine (ANTIVERT) 12.5 MG tablet, take 1 tablet by mouth three times a day if needed (Patient not taking: Reported on 06/22/2017), Disp: 30 tablet, Rfl: 0 .  metFORMIN (GLUCOPHAGE) 500 MG tablet, Take 1 tablet (500 mg total) by mouth 2 (two) times daily with a meal. (Patient not taking: Reported on 06/22/2017), Disp: 180 tablet, Rfl: 3 .  metFORMIN (GLUCOPHAGE) 500 MG tablet, take 1 tablet twice a day with food (Patient not taking: Reported on 06/22/2017), Disp: 60 tablet, Rfl: 5 .  montelukast (SINGULAIR) 10 MG tablet, Take 1 tablet (10 mg total) by mouth at bedtime. (Patient not taking: Reported on 06/22/2017), Disp: 90 tablet, Rfl: 3 .  oxybutynin (DITROPAN-XL) 10 MG 24 hr tablet, Take 1  tablet (10 mg total) by mouth at bedtime. (Patient not taking: Reported on 06/22/2017), Disp: 30 tablet, Rfl: 4 .  traMADol (ULTRAM) 50 MG tablet, TAKE ONE TABLET BY MOUTH AT BEDTIME IF NEEDED FOR BACK PAIN (Patient not taking: Reported on 06/22/2017), Disp: 30 tablet, Rfl: 0 .  traZODone (DESYREL) 50 MG tablet, Take 1 tablet (50 mg total) by mouth at bedtime. (Patient not taking: Reported on 06/22/2017), Disp: 30 tablet, Rfl: 4 .  Vitamin D, Ergocalciferol, (DRISDOL) 50000 units CAPS capsule, Take 1 capsule (50,000 Units total) by mouth every 7 (seven) days. (Patient not taking: Reported on 06/22/2017), Disp: 4 capsule, Rfl: 5   Review of Systems   Constitutional: Positive for appetite change, chills and unexpected weight change. Negative for diaphoresis, fatigue and fever.  HENT: Positive for congestion, dental problem, drooling, ear pain, mouth sores, sneezing, sore throat, trouble swallowing and voice change. Negative for facial swelling and hearing loss.   Eyes: Positive for pain, discharge, redness, itching and visual disturbance.  Respiratory: Positive for cough, shortness of breath and wheezing. Negative for choking.   Cardiovascular: Positive for chest pain and leg swelling. Negative for palpitations.  Gastrointestinal: Positive for abdominal pain, blood in stool, constipation and vomiting. Negative for diarrhea.  Endocrine: Positive for cold intolerance and heat intolerance. Negative for polydipsia.  Genitourinary: Negative for decreased urine volume, dysuria and hematuria.  Musculoskeletal: Positive for arthralgias, back pain and gait problem.  Skin: Negative for rash.  Allergic/Immunologic: Positive for environmental allergies.  Neurological: Positive for light-headedness and headaches. Negative for seizures and syncope.  Hematological: Negative for adenopathy.  Psychiatric/Behavioral: Positive for agitation, dysphoric mood and suicidal ideas. The patient is nervous/anxious.     Per HPI unless specifically indicated above     Objective:    BP (!) 160/96 (BP Location: Left Arm, Patient Position: Sitting, Cuff Size: Large)   Pulse 100   Temp 97.9 F (36.6 C) (Other (Comment))   Ht 5' 3"  (1.6 m)   Wt 240 lb (108.9 kg)   SpO2 96%   BMI 42.51 kg/m   Wt Readings from Last 3 Encounters:  06/22/17 240 lb (108.9 kg)  06/16/17 236 lb 6.4 oz (107.2 kg)  07/05/16 236 lb (107 kg)    Physical Exam  Constitutional: She is oriented to person, place, and time. She appears well-developed and well-nourished.  HENT:  Head: Normocephalic and atraumatic.  Mouth/Throat: Uvula is midline and oropharynx is clear and moist. No  oropharyngeal exudate.  Eyes: Pupils are equal, round, and reactive to light. Conjunctivae are normal. Right eye exhibits no nystagmus. Left eye exhibits no nystagmus.  Left upper eyelid droopy  Neck: Neck supple. No thyromegaly present.  Cardiovascular: Normal rate and regular rhythm.   Pulmonary/Chest: Effort normal and breath sounds normal.  Abdominal: Soft. Bowel sounds are normal. She exhibits no mass. There is no hepatosplenomegaly. There is no tenderness.  Musculoskeletal: She exhibits no edema.  Pt moves very slowly and with much difficulty  Lymphadenopathy:    She has no cervical adenopathy.  Neurological: She is alert and oriented to person, place, and time. She displays no tremor. Gait (ambulates very slowly with a cane and appears to be in lots of pain) abnormal.  Skin: Skin is warm and dry.  Psychiatric: She has a normal mood and affect. Her behavior is normal.  Vitals reviewed.     Results for orders placed or performed in visit on 06/16/17  POCT glucose  Result Value Ref Range   POC Glucose  189 (A) 70 - 99 mg/dl      Assessment & Plan:   Encounter Diagnoses  Name Primary?  Marland Kitchen Uncontrolled diabetes mellitus type 2 without complications, unspecified whether long term insulin use ([place]) Yes  . Essential hypertension   . Persistent asthma without complication, unspecified asthma severity   . Hyperlipidemia, unspecified hyperlipidemia type   . Chronic back pain, unspecified back location, unspecified back pain laterality   . Morbid obesity ([place])   . Vitamin D deficiency   . Urinary incontinence, unspecified type   . Elevated LFTs   . Depression, unspecified depression type   . Other chronic pain   . Screening for breast cancer      -will order screening Mammogram (pt has family planning medicaid)  -will get pt signed up for [facility]/medication assistance  -Rx for lisinopril, metformin and albuterol were given to the pt to get started on today- the lisinopril  and metformin are both $4 at [facility] and pt is not out of her albuterol mdi yet -rx lisinopril for bp in light of dm -rx metformin 500 bid which seems to have kept her controlled in the past -rx albuterol mdi to use prn  -pt was given sample of dulera.  She can finish her symbicort before starting the dulera (she says she still has enough for a few weeks more)  -These meds and others will be sent to [facility].  Some meds were changed from her previous due to what is available through [facility].   Told pt to keep the new medication list she will get at discharge  -pt to get fasting labs drawn tomorrow.  -pt to go to appointment at [facility] for [place] issues  -discussed with pt changing from cane to rolling walker for safety issues (including places to find one cheap) but pt says she has no money at all to do that.  Counseled pt to use care to prevent injury from fall   -pt will follow up in one month.  She is to RTO sooner if needed

## 2017-06-24 ENCOUNTER — Other Ambulatory Visit (HOSPITAL_COMMUNITY)
Admission: RE | Admit: 2017-06-24 | Discharge: 2017-06-24 | Disposition: A | Discharge status: Home / Self Care | Payer: BLUE CROSS/BLUE SHIELD | Source: Ambulatory Visit | Attending: Physician Assistant | Admitting: Physician Assistant

## 2017-06-24 DIAGNOSIS — I10 Essential (primary) hypertension: Secondary | ICD-10-CM | POA: Insufficient documentation

## 2017-06-24 DIAGNOSIS — R7989 Other specified abnormal findings of blood chemistry: Secondary | ICD-10-CM | POA: Insufficient documentation

## 2017-06-24 DIAGNOSIS — E1165 Type 2 diabetes mellitus with hyperglycemia: Secondary | ICD-10-CM | POA: Insufficient documentation

## 2017-06-24 DIAGNOSIS — IMO0001 Reserved for inherently not codable concepts without codable children: Secondary | ICD-10-CM

## 2017-06-24 DIAGNOSIS — R945 Abnormal results of liver function studies: Secondary | ICD-10-CM

## 2017-06-24 DIAGNOSIS — E559 Vitamin D deficiency, unspecified: Secondary | ICD-10-CM | POA: Insufficient documentation

## 2017-06-24 DIAGNOSIS — E785 Hyperlipidemia, unspecified: Secondary | ICD-10-CM

## 2017-06-24 LAB — COMPREHENSIVE METABOLIC PANEL
ALBUMIN: 4.2 g/dL (ref 3.5–5.0)
ALK PHOS: 105 U/L (ref 38–126)
ALT: 116 U/L — ABNORMAL HIGH (ref 14–54)
AST: 207 U/L — AB (ref 15–41)
Anion gap: 10 (ref 5–15)
BILIRUBIN TOTAL: 0.4 mg/dL (ref 0.3–1.2)
BUN: 9 mg/dL (ref 6–20)
CALCIUM: 9.2 mg/dL (ref 8.9–10.3)
CO2: 29 mmol/L (ref 22–32)
CREATININE: 0.86 mg/dL (ref 0.44–1.00)
Chloride: 99 mmol/L — ABNORMAL LOW (ref 101–111)
GFR calc Af Amer: >60 mL/min (ref >60)
GLUCOSE: 158 mg/dL — AB (ref 65–99)
Potassium: 3.9 mmol/L (ref 3.5–5.1)
Sodium: 138 mmol/L (ref 135–145)
TOTAL PROTEIN: 8.6 g/dL — AB (ref 6.5–8.1)

## 2017-06-24 LAB — CBC
HEMATOCRIT: 42.1 % (ref 36.0–46.0)
Hemoglobin: 13.5 g/dL (ref 12.0–15.0)
MCH: 28.3 pg (ref 26.0–34.0)
MCHC: 32.1 g/dL (ref 30.0–36.0)
MCV: 88.3 fL (ref 78.0–100.0)
PLATELETS: 269 10*3/uL (ref 150–400)
RBC: 4.77 MIL/uL (ref 3.87–5.11)
RDW: 13.7 % (ref 11.5–15.5)
WBC: 9.2 10*3/uL (ref 4.0–10.5)

## 2017-06-24 LAB — LIPID PANEL
CHOLESTEROL: 261 mg/dL — AB (ref 0–200)
HDL: 55 mg/dL (ref >40)
LDL Cholesterol: 169 mg/dL — ABNORMAL HIGH (ref 0–99)
Total CHOL/HDL Ratio: 4.7 RATIO
Triglycerides: 187 mg/dL — ABNORMAL HIGH (ref <150)
VLDL: 37 mg/dL (ref 0–40)

## 2017-06-25 LAB — MICROALBUMIN, URINE: MICROALB UR: 52.7 ug/mL — AB

## 2017-06-25 LAB — HEMOGLOBIN A1C
HEMOGLOBIN A1C: 8.7 % — AB (ref 4.8–5.6)
MEAN PLASMA GLUCOSE: 203 mg/dL

## 2017-06-25 LAB — VITAMIN D 25 HYDROXY (VIT D DEFICIENCY, FRACTURES): VIT D 25 HYDROXY: 17.3 ng/mL — AB (ref 30.0–100.0)

## 2017-07-21 ENCOUNTER — Ambulatory Visit: Payer: Self-pay | Admitting: Physician Assistant

## 2017-07-25 ENCOUNTER — Encounter: Payer: Self-pay | Admitting: Physician Assistant

## 2017-09-15 ENCOUNTER — Ambulatory Visit: Payer: Self-pay | Admitting: Physician Assistant

## 2017-09-22 ENCOUNTER — Ambulatory Visit: Payer: Self-pay | Admitting: Physician Assistant

## 2017-09-26 ENCOUNTER — Encounter: Payer: Self-pay | Admitting: Physician Assistant

## 2018-01-04 ENCOUNTER — Encounter: Payer: Self-pay | Admitting: Family Medicine

## 2018-01-04 ENCOUNTER — Ambulatory Visit (INDEPENDENT_AMBULATORY_CARE_PROVIDER_SITE_OTHER): Payer: Medicaid Other | Admitting: Family Medicine

## 2018-01-04 VITALS — BP 150/92 | HR 130 | Resp 16 | Ht 63.0 in | Wt 235.0 lb

## 2018-01-04 DIAGNOSIS — E669 Obesity, unspecified: Secondary | ICD-10-CM | POA: Diagnosis not present

## 2018-01-04 DIAGNOSIS — Z1231 Encounter for screening mammogram for malignant neoplasm of breast: Secondary | ICD-10-CM

## 2018-01-04 DIAGNOSIS — E559 Vitamin D deficiency, unspecified: Secondary | ICD-10-CM | POA: Diagnosis not present

## 2018-01-04 DIAGNOSIS — E1169 Type 2 diabetes mellitus with other specified complication: Secondary | ICD-10-CM | POA: Diagnosis not present

## 2018-01-04 DIAGNOSIS — Z1211 Encounter for screening for malignant neoplasm of colon: Secondary | ICD-10-CM

## 2018-01-04 DIAGNOSIS — F321 Major depressive disorder, single episode, moderate: Secondary | ICD-10-CM | POA: Diagnosis not present

## 2018-01-04 DIAGNOSIS — Z0000 Encounter for general adult medical examination without abnormal findings: Secondary | ICD-10-CM | POA: Diagnosis not present

## 2018-01-04 DIAGNOSIS — Z23 Encounter for immunization: Secondary | ICD-10-CM | POA: Diagnosis not present

## 2018-01-04 DIAGNOSIS — I10 Essential (primary) hypertension: Secondary | ICD-10-CM

## 2018-01-04 MED ORDER — LISINOPRIL 10 MG PO TABS: 10.0000 mg | ORAL_TABLET | Freq: Every day | ORAL | 3 refills | Status: DC

## 2018-01-04 MED ORDER — ALPRAZOLAM 1 MG PO TABS: 1.0000 mg | ORAL_TABLET | Freq: Two times a day (BID) | ORAL | 3 refills | Status: DC

## 2018-01-04 MED ORDER — ALBUTEROL SULFATE HFA 108 (90 BASE) MCG/ACT IN AERS: 2.0000 | INHALATION_SPRAY | Freq: Four times a day (QID) | RESPIRATORY_TRACT | 3 refills | Status: DC | PRN

## 2018-01-04 MED ORDER — TOLTERODINE TARTRATE ER 2 MG PO CP24: 2.0000 mg | ORAL_CAPSULE | Freq: Every day | ORAL | 3 refills | Status: DC

## 2018-01-04 MED ORDER — VENLAFAXINE HCL ER 37.5 MG PO CP24: 37.5000 mg | ORAL_CAPSULE | Freq: Every day | ORAL | 3 refills | Status: DC

## 2018-01-04 MED ORDER — MOMETASONE FURO-FORMOTEROL FUM 200-5 MCG/ACT IN AERO: 2.0000 | INHALATION_SPRAY | Freq: Two times a day (BID) | RESPIRATORY_TRACT | 3 refills | Status: DC

## 2018-01-04 NOTE — Patient Instructions (Signed)
F/u in 2.5 months, call if you need me before  Mammogram to be scheduled , as past due  Foot exam and flu vaccine today  New for depression is effexor one daily and xanax twice daily for anxiety  Labs today, CBC, lipid, cmp and eGFR, tSH and vit D, microalb and hBA1c

## 2018-01-05 ENCOUNTER — Other Ambulatory Visit: Payer: Self-pay | Admitting: Family Medicine

## 2018-01-05 DIAGNOSIS — R945 Abnormal results of liver function studies: Principal | ICD-10-CM

## 2018-01-05 DIAGNOSIS — R7989 Other specified abnormal findings of blood chemistry: Secondary | ICD-10-CM

## 2018-01-05 DIAGNOSIS — R1013 Epigastric pain: Secondary | ICD-10-CM

## 2018-01-05 LAB — CBC
HCT: 41.6 % (ref 35.0–45.0)
HEMOGLOBIN: 13.9 g/dL (ref 11.7–15.5)
MCH: 28.1 pg (ref 27.0–33.0)
MCHC: 33.4 g/dL (ref 32.0–36.0)
MCV: 84 fL (ref 80.0–100.0)
RBC: 4.95 10*6/uL (ref 3.80–5.10)
RDW: 13.5 % (ref 11.0–15.0)
WBC: 8.2 10*3/uL (ref 3.8–10.8)

## 2018-01-05 LAB — COMPLETE METABOLIC PANEL WITH GFR
AG Ratio: 1.2 (calc) (ref 1.0–2.5)
ALT: 116 U/L — ABNORMAL HIGH (ref 6–29)
AST: 262 U/L — AB (ref 10–35)
Albumin: 4.7 g/dL (ref 3.6–5.1)
Alkaline phosphatase (APISO): 109 U/L (ref 33–130)
BUN: 10 mg/dL (ref 7–25)
CALCIUM: 10.2 mg/dL (ref 8.6–10.4)
CO2: 32 mmol/L (ref 20–32)
CREATININE: 0.87 mg/dL (ref 0.50–1.05)
Chloride: 96 mmol/L — ABNORMAL LOW (ref 98–110)
GFR, EST NON AFRICAN AMERICAN: 74 mL/min/{1.73_m2} (ref >=60)
GFR, Est African American: 86 mL/min/{1.73_m2} (ref >=60)
GLOBULIN: 3.9 g/dL — AB (ref 1.9–3.7)
Glucose, Bld: 208 mg/dL — ABNORMAL HIGH (ref 65–139)
Potassium: 3.9 mmol/L (ref 3.5–5.3)
SODIUM: 139 mmol/L (ref 135–146)
Total Bilirubin: 0.5 mg/dL (ref 0.2–1.2)
Total Protein: 8.6 g/dL — ABNORMAL HIGH (ref 6.1–8.1)

## 2018-01-05 LAB — HEMOGLOBIN A1C
Hgb A1c MFr Bld: 8.6 % of total Hgb — ABNORMAL HIGH (ref <5.7)
MEAN PLASMA GLUCOSE: 200 (calc)
eAG (mmol/L): 11.1 (calc)

## 2018-01-05 LAB — VITAMIN D 25 HYDROXY (VIT D DEFICIENCY, FRACTURES): Vit D, 25-Hydroxy: 18 ng/mL — ABNORMAL LOW (ref 30–100)

## 2018-01-05 LAB — LIPID PANEL
CHOL/HDL RATIO: 5.4 (calc) — AB (ref <5.0)
CHOLESTEROL: 293 mg/dL — AB (ref <200)
HDL: 54 mg/dL (ref >50)
LDL CHOLESTEROL (CALC): 192 mg/dL — AB
Non-HDL Cholesterol (Calc): 239 mg/dL (calc) — ABNORMAL HIGH (ref <130)
Triglycerides: 263 mg/dL — ABNORMAL HIGH (ref <150)

## 2018-01-05 LAB — TSH: TSH: 1.8 m[IU]/L (ref 0.40–4.50)

## 2018-01-05 MED ORDER — GLIPIZIDE ER 5 MG PO TB24: 5.0000 mg | ORAL_TABLET | Freq: Every day | ORAL | 4 refills | Status: DC

## 2018-01-05 MED ORDER — EZETIMIBE 10 MG PO TABS: 10.0000 mg | ORAL_TABLET | Freq: Every day | ORAL | 5 refills | Status: DC

## 2018-01-05 MED ORDER — ERGOCALCIFEROL 1.25 MG (50000 UT) PO CAPS: 50000.0000 [IU] | ORAL_CAPSULE | ORAL | 1 refills | Status: DC

## 2018-01-05 MED ORDER — SITAGLIP PHOS-METFORMIN HCL ER 50-1000 MG PO TB24: 2.0000 | ORAL_TABLET | Freq: Every day | ORAL | 5 refills | Status: DC

## 2018-01-06 ENCOUNTER — Encounter: Payer: Self-pay | Admitting: Family Medicine

## 2018-01-06 NOTE — Progress Notes (Signed)
    Leslie Lyons     MRN: [id number]      DOB: 11-06-1961  HPI: Patient is in for annual physical exam. Multiple health concerns are addressed, has not had regulr medication for months, she has hypertension, diabetes, depression and anxiety, also incontinence and needs labs. She just recently got health coverage Immunization is reviewed , and  updated if needed.   PE: BP (!) 150/92   Pulse (!) 130   Resp 16   Ht 5' 3"  (1.6 m)   Wt 235 lb (106.6 kg)   SpO2 95%   BMI 41.63 kg/m   Pleasant  female, alert and oriented x 3, in mild cardio-pulmonary distress. Afebrile. HEENT No facial trauma or asymetry. Sinuses non tender.  Extra occullar muscles intact, pupils equally reactive to light. External ears normal, tympanic membranes clear. Oropharynx moist, no exudate. Neck: supple, no adenopathy,JVD or thyromegaly.No bruits.  Chest: Clear to ascultation bilaterally.No crackles or wheezes.Decreased air entry  Non tender to palpation  Breast: No asymetry,no masses or lumps. No tenderness. No nipple discharge or inversion. No axillary or supraclavicular adenopathy  Cardiovascular system; Heart sounds normal,  S1 and  S2 ,no S3.  No murmur, or thrill. Apical beat not displaced Peripheral pulses normal.  Abdomen: Soft, non tender, no organomegaly or masses. No bruits. Bowel sounds normal. No guarding, tenderness or rebound.  Rectal:  Normal sphincter. Prolapsed external hemmorhoidsNo rectal mass. Guaiac positive stool.  GU: Not examined   Musculoskeletal exam: Decreased  ROM of spine, hips , shoulders and knees.  No muscle wasting or atrophy.   Neurologic: Cranial nerves 2 to 12 intact. Power, tone ,sensation and reflexes normal throughout. Abnormal  gait. Ambulates with a walker.No tremor.  Skin: Intact, no ulceration, erythema , scaling or rash noted. Pigmentation normal throughout  Psych; Normal mood and affect. Judgement and concentration  normal   Assessment & Plan:  No problem-specific Assessment & Plan notes found for this encounter.

## 2018-01-06 NOTE — Assessment & Plan Note (Signed)
Annual exam as documented. . Immunization and cancer screening needs are specifically addressed at this visit.  

## 2018-01-06 NOTE — Assessment & Plan Note (Signed)
Refuses therapy, start effexor resume xanax as before

## 2018-01-06 NOTE — Assessment & Plan Note (Signed)
Uncontrolled Start medication , needs to test once daily Leslie Lyons is reminded of the importance of commitment to daily physical activity for 30 minutes or more, as able and the need to limit carbohydrate intake to 30 to 60 grams per meal to help with blood sugar control.   The need to take medication as prescribed, test blood sugar as directed, and to call between visits if there is a concern that blood sugar is uncontrolled is also discussed.   Leslie Lyons is reminded of the importance of daily foot exam, annual eye examination, and good blood sugar, blood pressure and cholesterol control.  Diabetic Labs Latest Ref Rng & Units 01/04/2018 06/24/2017 05/12/2016 11/13/2014 05/14/2014  HbA1c <5.7 % of total Hgb 8.6(H) 8.7(H) 6.6(H) 6.6(H) 6.3(H)  Microalbumin Not Estab. ug/mL - 52.7(H) - - -  Chol <200 mg/dL 293(H) 261(H) 231(H) - 182  HDL >50 mg/dL 54 55 75 - 55  Calc LDL 0 - 99 mg/dL - 169(H) 100 - 99  Triglycerides <150 mg/dL 263(H) 187(H) 279(H) - 141  Creatinine 0.50 - 1.05 mg/dL 0.87 0.86 0.76 0.63 0.74   BP/Weight 01/04/2018 06/22/2017 06/16/2017 07/05/2016 03/30/2016 03/16/2016 61/16/4353  Systolic BP 912 258 346 219 471 252 712  Diastolic BP 92 96 97 90 84 90 80  Wt. (Lbs) 235 240 236.4 236 218 222 228  BMI 41.63 42.51 43.24 41.81 38.63 39.34 40.4   Foot/eye exam completion dates 01/04/2018 06/22/2017  Foot Form Completion Done Done   Needs to see educator

## 2018-01-06 NOTE — Assessment & Plan Note (Addendum)
Uncontrolled, resume medication   DASH diet and weight loss  BP/Weight 01/04/2018 06/22/2017 06/16/2017 07/05/2016 03/30/2016 03/16/2016 03/49/1791  Systolic BP 505 697 948 016 553 748 270  Diastolic BP 92 96 97 90 84 90 80  Wt. (Lbs) 235 240 236.4 236 218 222 228  BMI 41.63 42.51 43.24 41.81 38.63 39.34 40.4

## 2018-01-07 LAB — MICROALBUMIN / CREATININE URINE RATIO
CREATININE, URINE: 93 mg/dL (ref 20–275)
MICROALB UR: 1.9 mg/dL
MICROALB/CREAT RATIO: 20 ug/mg{creat} (ref <30)

## 2018-01-09 ENCOUNTER — Ambulatory Visit (HOSPITAL_COMMUNITY)
Admission: RE | Admit: 2018-01-09 | Discharge: 2018-01-09 | Disposition: A | Discharge status: Home / Self Care | Payer: Medicaid Other | Source: Ambulatory Visit | Attending: Family Medicine | Admitting: Family Medicine

## 2018-01-09 DIAGNOSIS — Z1231 Encounter for screening mammogram for malignant neoplasm of breast: Secondary | ICD-10-CM | POA: Diagnosis present

## 2018-01-09 LAB — POC HEMOCCULT BLD/STL (OFFICE/1-CARD/DIAGNOSTIC): FECAL OCCULT BLD: NEGATIVE

## 2018-01-09 NOTE — Addendum Note (Signed)
Addended by: Eual Fines on: 01/09/2018 09:59 AM   Modules accepted: Orders

## 2018-01-10 ENCOUNTER — Telehealth: Payer: Self-pay | Admitting: Family Medicine

## 2018-01-11 ENCOUNTER — Other Ambulatory Visit: Payer: Self-pay

## 2018-01-11 DIAGNOSIS — E669 Obesity, unspecified: Principal | ICD-10-CM

## 2018-01-11 DIAGNOSIS — E1169 Type 2 diabetes mellitus with other specified complication: Secondary | ICD-10-CM

## 2018-01-11 MED ORDER — BLOOD GLUCOSE METER KIT: PACK | 0 refills | Status: DC

## 2018-01-11 MED ORDER — SITAGLIP PHOS-METFORMIN HCL ER 50-1000 MG PO TB24: 2.0000 | ORAL_TABLET | Freq: Every day | ORAL | 5 refills | Status: DC

## 2018-01-11 MED ORDER — EZETIMIBE 10 MG PO TABS: 10.0000 mg | ORAL_TABLET | Freq: Every day | ORAL | 5 refills | Status: DC

## 2018-01-11 MED ORDER — GLIPIZIDE ER 5 MG PO TB24: 5.0000 mg | ORAL_TABLET | Freq: Every day | ORAL | 5 refills | Status: DC

## 2018-01-12 ENCOUNTER — Other Ambulatory Visit: Payer: Self-pay

## 2018-01-12 MED ORDER — LISINOPRIL 10 MG PO TABS: 10.0000 mg | ORAL_TABLET | Freq: Every day | ORAL | 5 refills | Status: DC

## 2018-01-12 MED ORDER — EZETIMIBE 10 MG PO TABS: 10.0000 mg | ORAL_TABLET | Freq: Every day | ORAL | 5 refills | Status: DC

## 2018-01-12 MED ORDER — TOLTERODINE TARTRATE ER 2 MG PO CP24: 2.0000 mg | ORAL_CAPSULE | Freq: Every day | ORAL | 3 refills | Status: DC

## 2018-01-16 ENCOUNTER — Ambulatory Visit (HOSPITAL_COMMUNITY): Payer: Medicaid Other

## 2018-01-18 ENCOUNTER — Telehealth: Payer: Self-pay | Admitting: Family Medicine

## 2018-01-18 NOTE — Telephone Encounter (Signed)
Please cal pt regarding Scan as the hosp is telling her insurance will not cover.. And they are telling her to call us

## 2018-01-19 ENCOUNTER — Telehealth: Payer: Self-pay

## 2018-01-19 ENCOUNTER — Ambulatory Visit (HOSPITAL_COMMUNITY): Payer: Medicaid Other

## 2018-01-19 NOTE — Telephone Encounter (Signed)
See previous message

## 2018-01-19 NOTE — Telephone Encounter (Signed)
Please call pt about her ultrasound and kidney problem. Please call her at [phone]

## 2018-01-19 NOTE — Telephone Encounter (Signed)
Spoke with patient and she is aware that precert is still pending decision

## 2018-01-25 ENCOUNTER — Ambulatory Visit (HOSPITAL_COMMUNITY)
Admission: RE | Admit: 2018-01-25 | Discharge: 2018-01-25 | Disposition: A | Discharge status: Home / Self Care | Payer: Medicaid Other | Source: Ambulatory Visit | Attending: Family Medicine | Admitting: Family Medicine

## 2018-01-25 DIAGNOSIS — R1013 Epigastric pain: Secondary | ICD-10-CM

## 2018-01-25 DIAGNOSIS — K769 Liver disease, unspecified: Secondary | ICD-10-CM | POA: Diagnosis not present

## 2018-01-25 DIAGNOSIS — R945 Abnormal results of liver function studies: Secondary | ICD-10-CM

## 2018-01-25 DIAGNOSIS — R7989 Other specified abnormal findings of blood chemistry: Secondary | ICD-10-CM

## 2018-01-30 ENCOUNTER — Telehealth: Payer: Self-pay | Admitting: Family Medicine

## 2018-01-30 NOTE — Telephone Encounter (Signed)
Patient called asking for you to give her a call at [phone].

## 2018-02-01 NOTE — Telephone Encounter (Signed)
Spoke with patient.

## 2018-02-02 ENCOUNTER — Other Ambulatory Visit: Payer: Self-pay

## 2018-02-02 ENCOUNTER — Telehealth: Payer: Self-pay | Admitting: Family Medicine

## 2018-02-02 MED ORDER — GLIPIZIDE ER 5 MG PO TB24: 5.0000 mg | ORAL_TABLET | Freq: Every day | ORAL | 5 refills | Status: DC

## 2018-02-02 MED ORDER — SITAGLIP PHOS-METFORMIN HCL ER 50-1000 MG PO TB24: 2.0000 | ORAL_TABLET | Freq: Every day | ORAL | 5 refills | Status: DC

## 2018-02-02 NOTE — Telephone Encounter (Signed)
Wants to know if you think the lisinopril is safe and if you do she will stay on it.  I assured her but she wants to hear it from you as well. The stuff on the tv scared her

## 2018-02-02 NOTE — Telephone Encounter (Signed)
Yes it is safe

## 2018-02-02 NOTE — Telephone Encounter (Signed)
Patient called in with concerns about side effects she has been seeing on tv and facebook due to a blood pressure medication she is currently taking. She says she isnt sure if she is having side effects such as big lips because she was born with big lips. She is requesting to speak to a nurse. Cb#: [phone]

## 2018-02-03 ENCOUNTER — Telehealth: Payer: Self-pay | Admitting: Family Medicine

## 2018-02-03 ENCOUNTER — Other Ambulatory Visit: Payer: Self-pay

## 2018-02-03 MED ORDER — ACCU-CHEK FASTCLIX LANCETS MISC: 5 refills | Status: DC

## 2018-02-03 MED ORDER — GLUCOSE BLOOD VI STRP: ORAL_STRIP | 5 refills | Status: DC

## 2018-02-03 NOTE — Telephone Encounter (Signed)
Patient left a voicemail but the only thing I could understand is  just her name,phone number, and that she needed a prescription.  I tried to call patient back, no answer, full voicemail.

## 2018-02-07 NOTE — Telephone Encounter (Signed)
Called patient and advised of note that medication was safe to take, patient understood and had no concerns.  Patient had a question regarding her test strips and lancets. She states she was unaware that she was suppose to check once daily, and has been doing it twice daily. Has ran out of strips, and is asking if the RX can be switched to twice daily as sometimes the machine messes up and she has to use more strips and runs out quicker as well.   Please advise.  Thank you!

## 2018-02-07 NOTE — Telephone Encounter (Signed)
Called and notified patient.

## 2018-02-07 NOTE — Telephone Encounter (Signed)
Unfortunately, I am UNABLE to justify twice daily blood sugar testing, she does not use insulin, I understand that she is learning to use the strips and it is challenging , just try to be more careful. If she must, may get [facility] brand meter and strips to use as backup as "affordable" ( lower cost)

## 2018-02-08 ENCOUNTER — Telehealth: Payer: Self-pay

## 2018-02-08 NOTE — Telephone Encounter (Signed)
Called and notified patient of MD note. Patient understood and had no questions at this time.

## 2018-02-09 ENCOUNTER — Other Ambulatory Visit: Payer: Self-pay | Admitting: Family Medicine

## 2018-02-09 DIAGNOSIS — E1169 Type 2 diabetes mellitus with other specified complication: Secondary | ICD-10-CM

## 2018-02-09 DIAGNOSIS — E669 Obesity, unspecified: Principal | ICD-10-CM

## 2018-02-09 MED ORDER — GLUCOSE BLOOD VI STRP: ORAL_STRIP | 5 refills | Status: DC

## 2018-02-09 MED ORDER — ACCU-CHEK FASTCLIX LANCETS MISC: 5 refills | Status: DC

## 2018-02-09 NOTE — Telephone Encounter (Signed)
PATIENTS FAMILY MEMBER BROUGHT IN A BOX FOR ACCU-CHEK HE SAYS SHE NEEDS NEEDLES AND STRIPS [place] CB#: [phone]

## 2018-02-10 ENCOUNTER — Other Ambulatory Visit: Payer: Self-pay

## 2018-02-10 MED ORDER — ACCU-CHEK FASTCLIX LANCETS MISC: 0 refills | Status: DC

## 2018-02-10 MED ORDER — GLUCOSE BLOOD VI STRP: ORAL_STRIP | 0 refills | Status: DC

## 2018-04-25 ENCOUNTER — Ambulatory Visit (INDEPENDENT_AMBULATORY_CARE_PROVIDER_SITE_OTHER): Payer: Medicaid Other | Admitting: Family Medicine

## 2018-04-25 ENCOUNTER — Encounter: Payer: Self-pay | Admitting: Family Medicine

## 2018-04-25 VITALS — BP 124/88 | HR 119 | Resp 16 | Ht 63.0 in | Wt 228.0 lb

## 2018-04-25 DIAGNOSIS — G8929 Other chronic pain: Secondary | ICD-10-CM

## 2018-04-25 DIAGNOSIS — Z23 Encounter for immunization: Secondary | ICD-10-CM | POA: Diagnosis not present

## 2018-04-25 DIAGNOSIS — E1169 Type 2 diabetes mellitus with other specified complication: Secondary | ICD-10-CM | POA: Diagnosis not present

## 2018-04-25 DIAGNOSIS — E669 Obesity, unspecified: Secondary | ICD-10-CM | POA: Diagnosis not present

## 2018-04-25 DIAGNOSIS — R748 Abnormal levels of other serum enzymes: Secondary | ICD-10-CM | POA: Diagnosis not present

## 2018-04-25 DIAGNOSIS — I10 Essential (primary) hypertension: Secondary | ICD-10-CM | POA: Diagnosis not present

## 2018-04-25 DIAGNOSIS — M545 Low back pain: Secondary | ICD-10-CM

## 2018-04-25 DIAGNOSIS — E785 Hyperlipidemia, unspecified: Secondary | ICD-10-CM | POA: Diagnosis not present

## 2018-04-25 DIAGNOSIS — G441 Vascular headache, not elsewhere classified: Secondary | ICD-10-CM | POA: Diagnosis not present

## 2018-04-25 MED ORDER — VENLAFAXINE HCL ER 37.5 MG PO CP24: 37.5000 mg | ORAL_CAPSULE | Freq: Every day | ORAL | 5 refills | Status: DC

## 2018-04-25 MED ORDER — GLIPIZIDE ER 5 MG PO TB24: 5.0000 mg | ORAL_TABLET | Freq: Every day | ORAL | 5 refills | Status: DC

## 2018-04-25 MED ORDER — KETOROLAC TROMETHAMINE 60 MG/2ML IM SOLN
60.0000 mg | Freq: Once | INTRAMUSCULAR | Status: AC
  Administered 2018-04-25: 60 mg via INTRAMUSCULAR

## 2018-04-25 MED ORDER — LISINOPRIL 10 MG PO TABS: 10.0000 mg | ORAL_TABLET | Freq: Every day | ORAL | 5 refills | Status: DC

## 2018-04-25 MED ORDER — MOMETASONE FURO-FORMOTEROL FUM 200-5 MCG/ACT IN AERO: 2.0000 | INHALATION_SPRAY | Freq: Two times a day (BID) | RESPIRATORY_TRACT | 5 refills | Status: DC

## 2018-04-25 MED ORDER — BUTALBITAL-APAP-CAFFEINE 50-325-40 MG PO TABS: ORAL_TABLET | ORAL | 0 refills | Status: DC

## 2018-04-25 MED ORDER — ALPRAZOLAM 1 MG PO TABS: 1.0000 mg | ORAL_TABLET | Freq: Two times a day (BID) | ORAL | 3 refills | Status: DC

## 2018-04-25 MED ORDER — TOLTERODINE TARTRATE ER 2 MG PO CP24: 2.0000 mg | ORAL_CAPSULE | Freq: Every day | ORAL | 5 refills | Status: DC

## 2018-04-25 MED ORDER — SITAGLIP PHOS-METFORMIN HCL ER 50-1000 MG PO TB24: 2.0000 | ORAL_TABLET | Freq: Every day | ORAL | 5 refills | Status: DC

## 2018-04-25 MED ORDER — ERGOCALCIFEROL 1.25 MG (50000 UT) PO CAPS: 50000.0000 [IU] | ORAL_CAPSULE | ORAL | 3 refills | Status: DC

## 2018-04-25 MED ORDER — EZETIMIBE 10 MG PO TABS: 10.0000 mg | ORAL_TABLET | Freq: Every day | ORAL | 5 refills | Status: DC

## 2018-04-25 NOTE — Patient Instructions (Addendum)
F/U in 4 month, call if you need me sooner  todaol in  offic efor headache and back pain  New medication for headache fioricet, no more than twice weekl   y   labs today  Prevnar today  Please work on good  health habits so that your health will improve. 1. Commitment to daily physical activity for 30 to 60  minutes, if you are able to do this.  2. Commitment to wise food choices. Aim for half of your  food intake to be vegetable and fruit, one quarter starchy foods, and one quarter protein. Try to eat on a regular schedule  3 meals per day, snacking between meals should be limited to vegetables or fruits or small portions of nuts. 64 ounces of water per day is generally recommended, unless you have specific health conditions, like heart failure or kidney failure where you will need to limit fluid intake.  3. Commitment to sufficient and a  good quality of physical and mental rest daily, generally between 6 to 8 hours per day.  WITH PERSISTANCE AND PERSEVERANCE, THE IMPOSSIBLE , BECOMES THE NORM!

## 2018-04-26 LAB — COMPLETE METABOLIC PANEL WITH GFR
AG Ratio: 1.3 (calc) (ref 1.0–2.5)
ALKALINE PHOSPHATASE (APISO): 78 U/L (ref 33–130)
ALT: 80 U/L — AB (ref 6–29)
AST: 93 U/L — AB (ref 10–35)
Albumin: 4.6 g/dL (ref 3.6–5.1)
BILIRUBIN TOTAL: 0.3 mg/dL (ref 0.2–1.2)
BUN: 15 mg/dL (ref 7–25)
CHLORIDE: 100 mmol/L (ref 98–110)
CO2: 29 mmol/L (ref 20–32)
Calcium: 9.6 mg/dL (ref 8.6–10.4)
Creat: 0.82 mg/dL (ref 0.50–1.05)
GFR, Est African American: 92 mL/min/{1.73_m2} (ref >=60)
GFR, Est Non African American: 79 mL/min/{1.73_m2} (ref >=60)
Globulin: 3.6 g/dL (calc) (ref 1.9–3.7)
Glucose, Bld: 127 mg/dL — ABNORMAL HIGH (ref 65–99)
POTASSIUM: 4.1 mmol/L (ref 3.5–5.3)
Sodium: 140 mmol/L (ref 135–146)
Total Protein: 8.2 g/dL — ABNORMAL HIGH (ref 6.1–8.1)

## 2018-04-26 LAB — HEPATIC FUNCTION PANEL
AG Ratio: 1.3 (calc) (ref 1.0–2.5)
ALKALINE PHOSPHATASE (APISO): 78 U/L (ref 33–130)
ALT: 80 U/L — AB (ref 6–29)
AST: 93 U/L — AB (ref 10–35)
Albumin: 4.6 g/dL (ref 3.6–5.1)
BILIRUBIN DIRECT: 0.1 mg/dL (ref 0.0–0.2)
Globulin: 3.6 g/dL (calc) (ref 1.9–3.7)
Indirect Bilirubin: 0.2 mg/dL (calc) (ref 0.2–1.2)
Total Bilirubin: 0.3 mg/dL (ref 0.2–1.2)
Total Protein: 8.2 g/dL — ABNORMAL HIGH (ref 6.1–8.1)

## 2018-04-26 LAB — HEMOGLOBIN A1C
EAG (MMOL/L): 7.7 (calc)
Hgb A1c MFr Bld: 6.5 % of total Hgb — ABNORMAL HIGH (ref <5.7)
MEAN PLASMA GLUCOSE: 140 (calc)

## 2018-04-26 LAB — HEPATITIS PANEL, ACUTE
HEP A IGM: NONREACTIVE
HEP B C IGM: NONREACTIVE
HEP C AB: NONREACTIVE
Hepatitis B Surface Ag: NONREACTIVE
SIGNAL TO CUT-OFF: 0.07 (ref <1.00)

## 2018-04-28 ENCOUNTER — Other Ambulatory Visit: Payer: Self-pay | Admitting: Family Medicine

## 2018-04-28 ENCOUNTER — Encounter (INDEPENDENT_AMBULATORY_CARE_PROVIDER_SITE_OTHER): Payer: Self-pay | Admitting: *Deleted

## 2018-04-28 DIAGNOSIS — R7401 Elevation of levels of liver transaminase levels: Secondary | ICD-10-CM

## 2018-04-28 DIAGNOSIS — R740 Nonspecific elevation of levels of transaminase and lactic acid dehydrogenase [LDH]: Principal | ICD-10-CM

## 2018-04-28 DIAGNOSIS — K7581 Nonalcoholic steatohepatitis (NASH): Secondary | ICD-10-CM

## 2018-04-30 ENCOUNTER — Encounter: Payer: Self-pay | Admitting: Family Medicine

## 2018-04-30 NOTE — Assessment & Plan Note (Signed)
Hyperlipidemia:Low fat diet discussed and encouraged.   Lipid Panel  Lab Results  Component Value Date   CHOL 293 (H) 01/04/2018   HDL 54 01/04/2018   LDLCALC 192 (H) 01/04/2018   TRIG 263 (H) 01/04/2018   CHOLHDL 5.4 (H) 01/04/2018   Fatty liver with elevated lipids, refer to GI to follow , enzymes have improved with weight loss

## 2018-04-30 NOTE — Assessment & Plan Note (Signed)
Uncontrolled.Toradol aadministered IM in the office ,

## 2018-04-30 NOTE — Progress Notes (Signed)
Leslie Lyons     MRN: [id number]      DOB: Oct 29, 1961   HPI [name] is here for follow up and re-evaluation of chronic medical conditions, medication management and review of any available recent lab and radiology data.  Preventive health is updated, specifically  Cancer screening and Immunization.   Denies polyuria, polydipsia, blurred vision , or hypoglycemic episodes. Reports fBG average around 110 The PT denies any adverse reactions to current medications since the last visit.  C/o uncontrolled low back pian and headache requests treatment for both, noinciting factors  ROS Denies recent fever or chills. Denies sinus pressure, nasal congestion, ear pain or sore throat. Denies chest congestion, productive cough or wheezing. Denies chest pains, palpitations and leg swelling Denies abdominal pain, nausea, vomiting,diarrhea or constipation.   Denies dysuria, frequency, hesitancyc/o chronic  incontinence. . Denies  seizures, numbness, or tingling. Denies uncontrolled depression, anxiety or insomnia. Denies skin break down or rash.   PE  BP 124/88   Pulse (!) 119   Resp 16   Ht 5' 3"  (1.6 m)   Wt 228 lb (103.4 kg)   SpO2 95%   BMI 40.39 kg/m   Patient alert and oriented and in no cardiopulmonary distress.  HEENT: No facial asymmetry, EOMI,   oropharynx pink and moist.  Neck supple no JVD, no mass.  Chest: Clear to auscultation bilaterally.  CVS: S1, S2 no murmurs, no S3.Regular rate.  ABD: Soft non tender.   Ext: No edema  HT:DSKAJGOTL  ROM lumbar  spine, adequate in shoulders, hips and knees.  Skin: Intact, no ulcerations or rash noted.  Psych: Good eye contact, normal affect. Memory intact not anxious or depressed appearing.  CNS: CN 2-12 intact, power,  normal throughout.no focal deficits noted.   Assessment & Plan  Essential hypertension Controlled, no change in medication DASH diet and commitment to daily physical activity for a minimum of 30  minutes discussed and encouraged, as a part of hypertension management. The importance of attaining a healthy weight is also discussed.  BP/Weight 04/25/2018 01/04/2018 06/22/2017 06/16/2017 07/05/2016 03/30/2016 5/72/6203  Systolic BP 559 741 638 453 646 803 212  Diastolic BP 88 92 96 97 90 84 90  Wt. (Lbs) 228 235 240 236.4 236 218 222  BMI 40.39 41.63 42.51 43.24 41.81 38.63 39.34       Back pain, chronic Uncontrolled.Toradol aadministered IM in the office ,   Morbid obesity ([name]) Improved, pt applauded on this and encouraged to continue same. Patient re-educated about  the importance of commitment to a  minimum of 150 minutes of exercise per week.  The importance of healthy food choices with portion control discussed. Goals set by the patient for the next several months.   Weight /BMI 04/25/2018 01/04/2018 06/22/2017  WEIGHT 228 lb 235 lb 240 lb  HEIGHT 5' 3"  5' 3"  5' 3"   BMI 40.39 kg/m2 41.63 kg/m2 42.51 kg/m2      Hyperlipidemia LDL goal <100 Hyperlipidemia:Low fat diet discussed and encouraged.   Lipid Panel  Lab Results  Component Value Date   CHOL 293 (H) 01/04/2018   HDL 54 01/04/2018   LDLCALC 192 (H) 01/04/2018   TRIG 263 (H) 01/04/2018   CHOLHDL 5.4 (H) 01/04/2018   Fatty liver with elevated lipids, refer to GI to follow , enzymes have improved with weight loss    Diabetes mellitus type 2 in obese (HCC) Controlled, no change in medication [name] is reminded of the importance of commitment  to daily physical activity for 30 minutes or more, as able and the need to limit carbohydrate intake to 30 to 60 grams per meal to help with blood sugar control.   The need to take medication as prescribed, test blood sugar as directed, and to call between visits if there is a concern that blood sugar is uncontrolled is also discussed.   [name] is reminded of the importance of daily foot exam, annual eye examination, and good blood sugar, blood pressure and  cholesterol control.  Diabetic Labs Latest Ref Rng & Units 04/25/2018 01/06/2018 01/04/2018 06/24/2017 05/12/2016  HbA1c <5.7 % of total Hgb 6.5(H) - 8.6(H) 8.7(H) 6.6(H)  Microalbumin mg/dL - 1.9 - 52.7(H) -  Micro/Creat Ratio <30 mcg/mg creat - 20 - - -  Chol <200 mg/dL - - 293(H) 261(H) 231(H)  HDL >50 mg/dL - - 54 55 75  Calc LDL mg/dL (calc) - - 192(H) 169(H) 100  Triglycerides <150 mg/dL - - 263(H) 187(H) 279(H)  Creatinine 0.50 - 1.05 mg/dL 0.82 - 0.87 0.86 0.76   BP/Weight 04/25/2018 01/04/2018 06/22/2017 06/16/2017 07/05/2016 03/30/2016 1/74/7159  Systolic BP 539 672 897 915 041 364 383  Diastolic BP 88 92 96 97 90 84 90  Wt. (Lbs) 228 235 240 236.4 236 218 222  BMI 40.39 41.63 42.51 43.24 41.81 38.63 39.34   Foot/eye exam completion dates 01/04/2018 06/22/2017  Foot Form Completion Done Done        Headache Non focal exam has responded in the past to fioricet so limited supply prescribed

## 2018-04-30 NOTE — Assessment & Plan Note (Signed)
Improved, pt applauded on this and encouraged to continue same. Patient re-educated about  the importance of commitment to a  minimum of 150 minutes of exercise per week.  The importance of healthy food choices with portion control discussed. Goals set by the patient for the next several months.   Weight /BMI 04/25/2018 01/04/2018 06/22/2017  WEIGHT 228 lb 235 lb 240 lb  HEIGHT 5' 3"  5' 3"  5' 3"   BMI 40.39 kg/m2 41.63 kg/m2 42.51 kg/m2

## 2018-04-30 NOTE — Assessment & Plan Note (Signed)
Controlled, no change in medication Leslie Lyons is reminded of the importance of commitment to daily physical activity for 30 minutes or more, as able and the need to limit carbohydrate intake to 30 to 60 grams per meal to help with blood sugar control.   The need to take medication as prescribed, test blood sugar as directed, and to call between visits if there is a concern that blood sugar is uncontrolled is also discussed.   Leslie Lyons is reminded of the importance of daily foot exam, annual eye examination, and good blood sugar, blood pressure and cholesterol control.  Diabetic Labs Latest Ref Rng & Units 04/25/2018 01/06/2018 01/04/2018 06/24/2017 05/12/2016  HbA1c <5.7 % of total Hgb 6.5(H) - 8.6(H) 8.7(H) 6.6(H)  Microalbumin mg/dL - 1.9 - 52.7(H) -  Micro/Creat Ratio <30 mcg/mg creat - 20 - - -  Chol <200 mg/dL - - 293(H) 261(H) 231(H)  HDL >50 mg/dL - - 54 55 75  Calc LDL mg/dL (calc) - - 192(H) 169(H) 100  Triglycerides <150 mg/dL - - 263(H) 187(H) 279(H)  Creatinine 0.50 - 1.05 mg/dL 0.82 - 0.87 0.86 0.76   BP/Weight 04/25/2018 01/04/2018 06/22/2017 06/16/2017 07/05/2016 03/30/2016 05/18/6939  Systolic BP 982 867 519 824 299 806 999  Diastolic BP 88 92 96 97 90 84 90  Wt. (Lbs) 228 235 240 236.4 236 218 222  BMI 40.39 41.63 42.51 43.24 41.81 38.63 39.34   Foot/eye exam completion dates 01/04/2018 06/22/2017  Foot Form Completion Done Done

## 2018-04-30 NOTE — Assessment & Plan Note (Signed)
Non focal exam has responded in the past to fioricet so limited supply prescribed

## 2018-04-30 NOTE — Assessment & Plan Note (Signed)
Controlled, no change in medication DASH diet and commitment to daily physical activity for a minimum of 30 minutes discussed and encouraged, as a part of hypertension management. The importance of attaining a healthy weight is also discussed.  BP/Weight 04/25/2018 01/04/2018 06/22/2017 06/16/2017 07/05/2016 03/30/2016 3/76/2831  Systolic BP 517 616 073 710 626 948 546  Diastolic BP 88 92 96 97 90 84 90  Wt. (Lbs) 228 235 240 236.4 236 218 222  BMI 40.39 41.63 42.51 43.24 41.81 38.63 39.34

## 2018-05-02 ENCOUNTER — Encounter: Payer: Self-pay | Admitting: Gastroenterology

## 2018-05-08 ENCOUNTER — Telehealth: Payer: Self-pay

## 2018-05-08 DIAGNOSIS — F332 Major depressive disorder, recurrent severe without psychotic features: Secondary | ICD-10-CM

## 2018-05-08 DIAGNOSIS — F321 Major depressive disorder, single episode, moderate: Secondary | ICD-10-CM

## 2018-05-08 NOTE — Telephone Encounter (Signed)
Left message on 04-25-2018 and 05-08-2018

## 2018-05-08 NOTE — BH Specialist Note (Signed)
[facility] Initial Clinical Assessment  MRN: [id number] NAME: Leslie Lyons Date: 05/08/18  Total time: 30 minutes  Type of Contact: Type of Contact: Phone Call Initial Contact Leslie Lyons consent obtained: Leslie Lyons consent obtained for Virtual Visit: (N/A) Reason for Visit today: Reason for Your Call/Visit Today: [facility] Initialn Assessment   Treatment History Leslie Lyons recently received Inpatient Treatment: Have You Recently Been in Any Inpatient Treatment (Hospital/Detox/Crisis Center/28-Day Program)?: No  Facility/Program:  NA  Date of discharge:  NA Leslie Lyons currently being seen by therapist/psychiatrist: Do You Currently Have a Therapist/Psychiatrist?: Yes   Leslie Lyons currently receiving the following services: Leslie Lyons Currently Receiving the Following Services:: Medication Management(Med Mgt from PCP)    Past Psychiatric History/Hospitalization(s): Anxiety: Yes Bipolar Disorder: No Depression: Yes Mania: No Psychosis: No Schizophrenia: No Personality Disorder: No Hospitalization for psychiatric illness: No History of Electroconvulsive Shock Therapy: No Prior Suicide Attempts: No  Decreased need for sleep: No  Euphoria: No Self Injurious behaviors No Family History of mental illness: No Family History of substance abuse: No  Substance Abuse: No  DUI: No  Insomnia: Yes  History of violence No  Physical, sexual or emotional abuse:No  Prior outpatient mental health therapy: Yes- unable to remember the year or the name of the facility.   Clinical Assessment: PHQ-9 Depression screen [facility] 2/9 05/08/2018 04/25/2018 01/04/2018 01/04/2018 03/30/2016  Decreased Interest 3 3 2 1 1   Down, Depressed, Hopeless 3 2 2 3 3   PHQ - 2 Score 6 5 4 4 4   Altered sleeping 3 2 3 3 3   Tired, decreased energy 2 2 3 3 3   Change in appetite 1 2 0 2 3  Feeling bad or failure about yourself  2 3 3 3 3   Trouble concentrating 1 3 3 2 2   Moving slowly or fidgety/restless 0 3 1 3 3   Suicidal  thoughts 0 3 1 2 2   PHQ-9 Score 15 23 18 22 23   Difficult doing work/chores Very difficult Very difficult Very difficult Very difficult Very difficult      GAD-7 Assessments: GAD 7 : Generalized Anxiety Score 05/08/2018  Nervous, Anxious, on Edge 3  Control/stop worrying 3  Worry too much - different things 3  Trouble relaxing 2  Restless 1  Easily annoyed or irritable 3  Afraid - awful might happen 1  Total GAD 7 Score 16  Anxiety Difficulty Very difficult       Social Functioning Social maturity: Social Maturity: Impulsive, Isolates Social judgement: Social Judgement: Normal, "Games developer"   Stress Current stressors: 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 ACCU-CHEK FASTCLIX LANCETS MISC Twice daily testing dx e11.9  . albuterol (PROAIR HFA) 108 (90 Base) MCG/ACT inhaler Inhale 2 puffs into the lungs every 6 (six) hours as needed.  . ALPRAZolam (XANAX) 1 MG tablet Take 1 tablet (1 mg total) by mouth 2 (two) times daily.  . blood glucose meter kit and supplies Dispense based on Leslie Lyons and insurance preference. Once daily testing dx e11.9  . butalbital-acetaminophen-caffeine (FIORICET, ESGIC) 50-325-40 MG tablet One tablet twice daily , as needed, for headache. Do nat use more than twice per week  . ergocalciferol (VITAMIN D2) 50000 units capsule Take 1 capsule (50,000 Units total) by mouth once a  week. One capsule once weekly  . ezetimibe (ZETIA) 10 MG tablet Take 1 tablet (10 mg total) by mouth daily.  Marland Kitchen glipiZIDE (GLUCOTROL XL) 5 MG 24 hr tablet Take 1 tablet (5 mg total) by mouth daily with breakfast.  .  glucose blood (ACCU-CHEK GUIDE) test strip Use as instructed twice daily dx e11.9  . ipratropium-albuterol (DUONEB) 0.5-2.5 (3) MG/3ML SOLN Take 3 mLs by nebulization every 6 (six) hours as needed. (Leslie Lyons not taking: Reported on 04/25/2018)  . lisinopril (PRINIVIL,ZESTRIL) 10 MG tablet Take 1 tablet (10 mg total) by mouth daily.  . mometasone (NASONEX) 50 MCG/ACT nasal spray Place 2 sprays into the nose daily. (Leslie Lyons not taking: Reported on 01/04/2018)  . mometasone-formoterol (DULERA) 200-5 MCG/ACT AERO Inhale 2 puffs into the lungs 2 (two) times daily.  . SitaGLIPtin-MetFORMIN HCl 50-1000 MG TB24 Take 2 tablets by mouth daily.  Marland Kitchen tolterodine (DETROL LA) 2 MG 24 hr capsule Take 1 capsule (2 mg total) by mouth daily.  Marland Kitchen venlafaxine XR (EFFEXOR-XR) 37.5 MG 24 hr capsule Take 1 capsule (37.5 mg total) by mouth daily with breakfast.   No facility-administered encounter medications on file as of 05/08/2018.     Self-harm Behaviors Risk Assessment Self-harm risk factors:   Leslie Lyons endorses recent thoughts of harming self: Have you recently had any thoughts about harming yourself?: No    Danger to Others Risk Assessment Danger to others risk factors: Danger to Others Risk Factors: No risk factors noted Leslie Lyons endorses recent thoughts of harming others: Notification required: No need or identified person   Substance Use Assessment Leslie Lyons recently consumed alcohol:  No Leslie Lyons recently used drugs:  No Leslie Lyons is concerned about dependence or abuse of substances:  No    Goals, Interventions and Follow-up Plan Goals: Increase healthy adjustment to current life circumstances Interventions: Motivational Interviewing, Solution-Focused Strategies, Mindfulness or Psychologist, educational, Behavioral Activation, Brief CBT, Supportive Counseling, Medication Monitoring and Sleep Hygiene Follow-up Plan: 1.  Medication recommendations from Dr. Modesta Messing  Summary of Clinical Assessment  Summary:   Leslie Lyons  is a 57-year  old African American female that reports that her medication has not been working for her.                                         Leslie Lyons reports depresson associated with being cancer survior since 2016/02/05.  Her brothers died in 02-04-17 and 02/04/18.  She lost  her home in 02-04-2010 and is now living with family friends . Her step son died in 02/05/04.                                                                                            Leslie Lyons has been receiving disabiltty since 05-Feb-2016.  Leslie Lyons reports that she is currently in a wheel chair due to her bones diteriating.  Leslie Lyons has been married for 37-years.  Leslie Lyons has a problem sleeping and sometimes has insomnia.      Graciella Freer [place], LCAS-A

## 2018-05-18 NOTE — Patient Outreach (Signed)
Error in charting.

## 2018-06-07 ENCOUNTER — Telehealth: Payer: Self-pay

## 2018-06-07 NOTE — Telephone Encounter (Signed)
[facility] - Left Msg 

## 2018-06-15 ENCOUNTER — Other Ambulatory Visit: Payer: Self-pay | Admitting: Family Medicine

## 2018-06-20 ENCOUNTER — Other Ambulatory Visit: Payer: Self-pay

## 2018-06-20 MED ORDER — EZETIMIBE 10 MG PO TABS: 10.0000 mg | ORAL_TABLET | Freq: Every day | ORAL | 3 refills | Status: DC

## 2018-06-20 MED ORDER — SITAGLIP PHOS-METFORMIN HCL ER 50-1000 MG PO TB24: 2.0000 | ORAL_TABLET | Freq: Every day | ORAL | 3 refills | Status: DC

## 2018-06-20 MED ORDER — GLIPIZIDE ER 5 MG PO TB24: 5.0000 mg | ORAL_TABLET | Freq: Every day | ORAL | 3 refills | Status: DC

## 2018-06-20 MED ORDER — MOMETASONE FUROATE 50 MCG/ACT NA SUSP: 2.0000 | Freq: Every day | NASAL | 3 refills | Status: DC

## 2018-06-20 MED ORDER — ERGOCALCIFEROL 1.25 MG (50000 UT) PO CAPS: 50000.0000 [IU] | ORAL_CAPSULE | ORAL | 3 refills | Status: DC

## 2018-06-20 MED ORDER — MOMETASONE FURO-FORMOTEROL FUM 200-5 MCG/ACT IN AERO: 2.0000 | INHALATION_SPRAY | Freq: Two times a day (BID) | RESPIRATORY_TRACT | 3 refills | Status: DC

## 2018-06-20 MED ORDER — VENLAFAXINE HCL ER 37.5 MG PO CP24: 37.5000 mg | ORAL_CAPSULE | Freq: Every day | ORAL | 3 refills | Status: DC

## 2018-06-20 MED ORDER — TOLTERODINE TARTRATE ER 2 MG PO CP24: 2.0000 mg | ORAL_CAPSULE | Freq: Every day | ORAL | 3 refills | Status: DC

## 2018-06-20 MED ORDER — LISINOPRIL 10 MG PO TABS: 10.0000 mg | ORAL_TABLET | Freq: Every day | ORAL | 3 refills | Status: DC

## 2018-06-20 MED ORDER — ALBUTEROL SULFATE HFA 108 (90 BASE) MCG/ACT IN AERS: 2.0000 | INHALATION_SPRAY | Freq: Four times a day (QID) | RESPIRATORY_TRACT | 3 refills | Status: DC | PRN

## 2018-06-30 ENCOUNTER — Telehealth: Payer: Self-pay

## 2018-06-30 NOTE — Telephone Encounter (Signed)
[facility] - Left Msg 

## 2018-07-03 ENCOUNTER — Telehealth: Payer: Self-pay

## 2018-07-03 NOTE — Telephone Encounter (Signed)
[facility] - Left Msg 

## 2018-07-18 ENCOUNTER — Telehealth: Payer: Self-pay

## 2018-07-18 NOTE — Telephone Encounter (Signed)
3rd attempt - [facility]

## 2018-07-27 ENCOUNTER — Telehealth: Payer: Self-pay

## 2018-07-27 NOTE — Telephone Encounter (Signed)
Several attempts have been made to contact patient without success. Patient will be placed on the inactive list.  If services are needed again.  Please contact [facility] at [phone].    Information will be routed to the PCP and Dr. Modesta Messing

## 2018-08-01 ENCOUNTER — Ambulatory Visit: Payer: Medicaid Other | Admitting: Gastroenterology

## 2018-08-29 ENCOUNTER — Ambulatory Visit: Payer: Medicaid Other | Admitting: Family Medicine

## 2018-10-03 ENCOUNTER — Ambulatory Visit (INDEPENDENT_AMBULATORY_CARE_PROVIDER_SITE_OTHER): Payer: Medicare Other | Admitting: Family Medicine

## 2018-10-03 ENCOUNTER — Encounter: Payer: Self-pay | Admitting: Family Medicine

## 2018-10-03 VITALS — BP 144/94 | HR 100 | Resp 16 | Ht 63.0 in | Wt 235.0 lb

## 2018-10-03 DIAGNOSIS — M549 Dorsalgia, unspecified: Secondary | ICD-10-CM

## 2018-10-03 DIAGNOSIS — I10 Essential (primary) hypertension: Secondary | ICD-10-CM

## 2018-10-03 DIAGNOSIS — E785 Hyperlipidemia, unspecified: Secondary | ICD-10-CM

## 2018-10-03 DIAGNOSIS — Z23 Encounter for immunization: Secondary | ICD-10-CM

## 2018-10-03 DIAGNOSIS — G8929 Other chronic pain: Secondary | ICD-10-CM

## 2018-10-03 DIAGNOSIS — Z5309 Procedure and treatment not carried out because of other contraindication: Secondary | ICD-10-CM | POA: Diagnosis not present

## 2018-10-03 DIAGNOSIS — G441 Vascular headache, not elsewhere classified: Secondary | ICD-10-CM

## 2018-10-03 DIAGNOSIS — K644 Residual hemorrhoidal skin tags: Secondary | ICD-10-CM | POA: Diagnosis not present

## 2018-10-03 DIAGNOSIS — E669 Obesity, unspecified: Secondary | ICD-10-CM

## 2018-10-03 DIAGNOSIS — K7581 Nonalcoholic steatohepatitis (NASH): Secondary | ICD-10-CM | POA: Diagnosis not present

## 2018-10-03 DIAGNOSIS — E1169 Type 2 diabetes mellitus with other specified complication: Secondary | ICD-10-CM

## 2018-10-03 MED ORDER — ALPRAZOLAM 1 MG PO TABS: 1.0000 mg | ORAL_TABLET | Freq: Two times a day (BID) | ORAL | 1 refills | Status: DC

## 2018-10-03 MED ORDER — KETOROLAC TROMETHAMINE 60 MG/2ML IM SOLN
60.0000 mg | Freq: Once | INTRAMUSCULAR | Status: AC
  Administered 2018-10-03: 60 mg via INTRAMUSCULAR

## 2018-10-03 MED ORDER — BUTALBITAL-APAP-CAFFEINE 50-325-40 MG PO TABS: ORAL_TABLET | ORAL | 0 refills | Status: DC

## 2018-10-03 MED ORDER — LISINOPRIL 10 MG PO TABS: 10.0000 mg | ORAL_TABLET | Freq: Every day | ORAL | 3 refills | Status: DC

## 2018-10-03 MED ORDER — HYDROCORTISONE ACETATE 10 % RE FOAM: 1.0000 | Freq: Two times a day (BID) | RECTAL | 1 refills | Status: DC

## 2018-10-03 NOTE — Patient Instructions (Addendum)
F/U in 6 to 7 weeks,cancel 14th , call if you need me before  Welcome medicare with MD in 6 to 7 weeks or in 4 months  Medication is as listed  Labs today, lipid, cmp and EGFR, HBA1C  Flu vaccine today  You are being rprescribed med for hemmorhoid Toradol 60 mg IM today for back pain

## 2018-10-04 ENCOUNTER — Telehealth: Payer: Self-pay

## 2018-10-04 ENCOUNTER — Telehealth: Payer: Self-pay | Admitting: Family Medicine

## 2018-10-04 ENCOUNTER — Other Ambulatory Visit: Payer: Self-pay | Admitting: Family Medicine

## 2018-10-04 ENCOUNTER — Other Ambulatory Visit: Payer: Self-pay

## 2018-10-04 DIAGNOSIS — R7401 Elevation of levels of liver transaminase levels: Secondary | ICD-10-CM

## 2018-10-04 DIAGNOSIS — E1169 Type 2 diabetes mellitus with other specified complication: Secondary | ICD-10-CM

## 2018-10-04 DIAGNOSIS — K760 Fatty (change of) liver, not elsewhere classified: Secondary | ICD-10-CM

## 2018-10-04 DIAGNOSIS — R740 Nonspecific elevation of levels of transaminase and lactic acid dehydrogenase [LDH]: Secondary | ICD-10-CM

## 2018-10-04 DIAGNOSIS — E669 Obesity, unspecified: Principal | ICD-10-CM

## 2018-10-04 DIAGNOSIS — G441 Vascular headache, not elsewhere classified: Secondary | ICD-10-CM

## 2018-10-04 LAB — HEMOGLOBIN A1C
EAG (MMOL/L): 14.1 (calc)
Hgb A1c MFr Bld: 10.5 % of total Hgb — ABNORMAL HIGH (ref <5.7)
MEAN PLASMA GLUCOSE: 255 (calc)

## 2018-10-04 LAB — LIPID PANEL
Cholesterol: 232 mg/dL — ABNORMAL HIGH (ref <200)
HDL: 53 mg/dL (ref >50)
LDL Cholesterol (Calc): 143 mg/dL (calc) — ABNORMAL HIGH
NON-HDL CHOLESTEROL (CALC): 179 mg/dL — AB (ref <130)
Total CHOL/HDL Ratio: 4.4 (calc) (ref <5.0)
Triglycerides: 221 mg/dL — ABNORMAL HIGH (ref <150)

## 2018-10-04 LAB — COMPLETE METABOLIC PANEL WITH GFR
AG Ratio: 1.1 (calc) (ref 1.0–2.5)
ALBUMIN MSPROF: 4.7 g/dL (ref 3.6–5.1)
ALT: 110 U/L — AB (ref 6–29)
AST: 232 U/L — ABNORMAL HIGH (ref 10–35)
Alkaline phosphatase (APISO): 115 U/L (ref 33–130)
BUN/Creatinine Ratio: 9 (calc) (ref 6–22)
BUN: 10 mg/dL (ref 7–25)
CHLORIDE: 95 mmol/L — AB (ref 98–110)
CO2: 31 mmol/L (ref 20–32)
Calcium: 10 mg/dL (ref 8.6–10.4)
Creat: 1.08 mg/dL — ABNORMAL HIGH (ref 0.50–1.05)
GFR, EST AFRICAN AMERICAN: 66 mL/min/{1.73_m2} (ref >=60)
GFR, EST NON AFRICAN AMERICAN: 57 mL/min/{1.73_m2} — AB (ref >=60)
GLUCOSE: 260 mg/dL — AB (ref 65–99)
Globulin: 4.2 g/dL (calc) — ABNORMAL HIGH (ref 1.9–3.7)
Potassium: 4 mmol/L (ref 3.5–5.3)
Sodium: 138 mmol/L (ref 135–146)
TOTAL PROTEIN: 8.9 g/dL — AB (ref 6.1–8.1)
Total Bilirubin: 0.3 mg/dL (ref 0.2–1.2)

## 2018-10-04 MED ORDER — INSULIN GLARGINE 100 UNIT/ML SOLOSTAR PEN: 10.0000 [IU] | PEN_INJECTOR | Freq: Every day | SUBCUTANEOUS | 5 refills | Status: DC

## 2018-10-04 MED ORDER — GLIPIZIDE ER 10 MG PO TB24: 10.0000 mg | ORAL_TABLET | Freq: Every day | ORAL | 5 refills | Status: DC

## 2018-10-04 MED ORDER — GLIPIZIDE ER 10 MG PO TB24: 10.0000 mg | ORAL_TABLET | Freq: Every day | ORAL | 1 refills | Status: DC

## 2018-10-04 NOTE — Addendum Note (Signed)
Addended by: Eual Fines on: 10/04/2018 01:43 PM   Modules accepted: Orders

## 2018-10-04 NOTE — Progress Notes (Signed)
amb endo

## 2018-10-04 NOTE — Progress Notes (Unsigned)
Glipizide?

## 2018-10-04 NOTE — Telephone Encounter (Signed)
-----   Message from [name], MD sent at 10/04/2018 12:46 PM EST ----- Your recent blood work shows that your blood sugar is VERY uncontrolled, and I have referred t you to dr Dorris Fetch for management. You do NEED to start insulin, please send in lantus 10 units daily if she agrees, and I have increased the glipizide to 10 mg daily Please also refer her to diabetic ed Your liver enzymes have increased a lot, so you are referred to liver/ GI specialist, Dr Augusto Gamble, the office will call you Your cholesterol is too high, please reduce fried and fatty foods, butter , oil and cheese

## 2018-10-04 NOTE — Telephone Encounter (Signed)
Please also refer her to diabetic educator and let her know Also she neeeds to STOP the headache medication fioricet, as that has in tylenol , her liver is severely damaged. You may refer her to Dr Merlene Laughter for management of her headaches, I will sign Thanks this goes along with her lab result note

## 2018-10-05 NOTE — Addendum Note (Signed)
Addended by: Eual Fines on: 10/05/2018 01:44 PM   Modules accepted: Orders

## 2018-10-05 NOTE — Telephone Encounter (Signed)
Pt aware and understands. Referral sent to [facility] and to diabetic educator

## 2018-10-09 ENCOUNTER — Telehealth: Payer: Self-pay

## 2018-10-09 DIAGNOSIS — E1169 Type 2 diabetes mellitus with other specified complication: Secondary | ICD-10-CM

## 2018-10-09 DIAGNOSIS — E669 Obesity, unspecified: Principal | ICD-10-CM

## 2018-10-09 NOTE — Telephone Encounter (Signed)
-----   Message from [name], MD sent at 10/06/2018  6:24 AM EST ----- Please also refer her to [place] worker for medication assistance, thanks.  ----- Message ----- From: Eual Fines, LPN Sent: 27/04/1847   2:15 PM EST To: [name], MD  Patient aware- said there was no way she could pay for anymore medicine. I called debra at the health dept pt assistance and faxed over he rx for lantus. The glipizide is $9 at [facility] and will let her know since the HD doesn't supply that

## 2018-10-10 ENCOUNTER — Other Ambulatory Visit: Payer: Self-pay

## 2018-10-10 NOTE — Patient Outreach (Signed)
[facility]) Care Management  10/10/2018  Leslie Lyons 09/12/1961 [id number]   Referral Date: 10/09/18 Referral Source: MD referral Referral Reason: Medication Assistance   Outreach Attempt: no answer. HIPAA compliant voice message left.      Plan: RN CM will attempt patient again within 4 business days and send letter.  Jone Baseman, RN, MSN [facility] Care Management Care Management Coordinator Direct Line [phone] Toll Free: [phone]  Fax: [phone]

## 2018-10-12 ENCOUNTER — Ambulatory Visit: Payer: Medicaid Other | Admitting: Family Medicine

## 2018-10-12 ENCOUNTER — Other Ambulatory Visit: Payer: Self-pay

## 2018-10-12 NOTE — Patient Outreach (Signed)
[facility]) Care Management  10/12/2018  Leslie Lyons 1961/01/07 [id number]   Referral Date: 10/09/18 Referral Source: MD referral Referral Reason: Medication Assistance   Outreach Attempt: no answer. HIPAA compliant voice message left.      Plan: RN CM will attempt patient again within 4 business days.  Jone Baseman, RN, MSN [place] Management Care Management Coordinator Direct Line [phone] Cell [phone] Toll Free: [phone]  Fax: [phone]

## 2018-10-13 ENCOUNTER — Other Ambulatory Visit: Payer: Self-pay

## 2018-10-13 NOTE — Patient Outreach (Signed)
Leslie Lyons [facility]) Care Management  10/13/2018  Leslie Lyons 1961-08-14 [id number]   Referral Date:10/09/18 Referral Source:MD referral Referral Reason:Medication Assistance   Outreach Attempt:no answer. HIPAA compliant voice message left.     Plan: RN CM will wait return call.  If no return call will close case.  Jone Baseman, RN, MSN [facility] Management Care Management Coordinator Direct Line [phone] Cell [phone] Toll Free: [phone]  Fax: [phone]

## 2018-10-25 ENCOUNTER — Other Ambulatory Visit: Payer: Self-pay

## 2018-10-25 ENCOUNTER — Encounter: Payer: Self-pay | Admitting: Family Medicine

## 2018-10-25 DIAGNOSIS — Z5309 Procedure and treatment not carried out because of other contraindication: Secondary | ICD-10-CM | POA: Insufficient documentation

## 2018-10-25 DIAGNOSIS — K7581 Nonalcoholic steatohepatitis (NASH): Secondary | ICD-10-CM | POA: Insufficient documentation

## 2018-10-25 NOTE — Assessment & Plan Note (Signed)
C/o daily headache for past 1 week, relieved with injection in the past, Toradol 60 mg IM administered

## 2018-10-25 NOTE — Assessment & Plan Note (Signed)
Uncontrolled, reports non compliance with medication, importance of taking meds as prescribed is stressed  DASH diet and commitment to daily physical activity for a minimum of 30 minutes discussed and encouraged, as a part of hypertension management. The importance of attaining a healthy weight is also discussed.  BP/Weight 10/03/2018 04/25/2018 01/04/2018 06/22/2017 06/16/2017 0/06/6577 03/04/9628  Systolic BP 528 413 244 010 272 536 644  Diastolic BP 94 88 92 96 97 90 84  Wt. (Lbs) 235 228 235 240 236.4 236 218  BMI 41.63 40.39 41.63 42.51 43.24 41.81 38.63

## 2018-10-25 NOTE — Assessment & Plan Note (Signed)
Markedly elevated liver enzymes, statin therapy currently contraindicated

## 2018-10-25 NOTE — Assessment & Plan Note (Signed)
After obtaining informed consent, the vaccine is  administered by LPN.  

## 2018-10-25 NOTE — Patient Outreach (Signed)
[facility]) Care Management  10/25/2018  Leslie Lyons 10/20/1961 [id number]   Multiple attempts to establish contact with patient without success. No response from letter mailed to patient.   Plan: RN CM will close case at this time.   Jone Baseman, RN, MSN [place] Management Care Management Coordinator Direct Line [phone] Cell [phone] Toll Free: [phone]  Fax: [phone]

## 2018-10-25 NOTE — Assessment & Plan Note (Signed)
Deteriorated. Patient re-educated about  the importance of commitment to a  minimum of 150 minutes of exercise per week.  The importance of healthy food choices with portion control discussed. Encouraged to start a food diary, count calories and to consider  joining a support group. Sample diet sheets offered. Goals set by the patient for the next several months.   Weight /BMI 10/03/2018 04/25/2018 01/04/2018  WEIGHT 235 lb 228 lb 235 lb  HEIGHT 5' 3"  5' 3"  5' 3"   BMI 41.63 kg/m2 40.39 kg/m2 41.63 kg/m2

## 2018-10-25 NOTE — Progress Notes (Signed)
Leslie Lyons     MRN: [id number]      DOB: 07-05-1961   HPI Leslie Lyons is here for follow up and re-evaluation of chronic medical conditions, medication management and review of any available recent lab and radiology data.  Preventive health is updated, specifically  Cancer screening and Immunization.   Questions or concerns regarding consultations or procedures which the PT has had in the interim are  addressed. The PT denies any adverse reactions to current medications since the last visit.  There are no new concerns.  There are no specific complaints   ROS Denies recent fever or chills. Denies sinus pressure, nasal congestion, ear pain or sore throat. Denies chest congestion, productive cough or wheezing. Denies chest pains, palpitations and leg swelling Denies abdominal pain, nausea, vomiting,diarrhea or constipation.   Denies dysuria, frequency, hesitancy or incontinence. Denies joint pain, swelling and limitation in mobility. Denies headaches, seizures, numbness, or tingling. Denies depression, anxiety or insomnia. Denies skin break down or rash.   PE  BP (!) 144/94   Pulse 100   Resp 16   Ht 5' 3"  (1.6 m)   Wt 235 lb (106.6 kg)   BMI 41.63 kg/m   Patient alert and oriented and in no cardiopulmonary distress.  HEENT: No facial asymmetry, EOMI,   oropharynx pink and moist.  Neck supple no JVD, no mass.  Chest: Clear to auscultation bilaterally.  CVS: S1, S2 no murmurs, no S3.Regular rate.  ABD: Soft non tender.   Ext: No edema  MS: Decreased ROM spine. Skin: Intact, no ulcerations or rash noted.  Psych: Good eye contact, normal affect. Memory intact not anxious or depressed appearing.  CNS: CN 2-12 intact, power,  normal throughout.no focal deficits noted.   Assessment & Plan Headache C/o daily headache for past 1 week, relieved with injection in the past, Toradol 60 mg IM administered  External hemorrhoids Symptomatic currently , topical  preparation prescribed  Morbid obesity ([place]) Deteriorated. Patient re-educated about  the importance of commitment to a  minimum of 150 minutes of exercise per week.  The importance of healthy food choices with portion control discussed. Encouraged to start a food diary, count calories and to consider  joining a support group. Sample diet sheets offered. Goals set by the patient for the next several months.   Weight /BMI 10/03/2018 04/25/2018 01/04/2018  WEIGHT 235 lb 228 lb 235 lb  HEIGHT 5' 3"  5' 3"  5' 3"   BMI 41.63 kg/m2 40.39 kg/m2 41.63 kg/m2      Essential hypertension Uncontrolled, reports non compliance with medication, importance of taking meds as prescribed is stressed  DASH diet and commitment to daily physical activity for a minimum of 30 minutes discussed and encouraged, as a part of hypertension management. The importance of attaining a healthy weight is also discussed.  BP/Weight 10/03/2018 04/25/2018 01/04/2018 06/22/2017 06/16/2017 04/01/6269 02/01/92  Systolic BP 818 299 371 696 [phone]  Diastolic BP 94 88 92 96 97 90 84  Wt. (Lbs) 235 228 235 240 236.4 236 218  BMI 41.63 40.39 41.63 42.51 43.24 41.81 38.63       Diabetes mellitus type 2 in obese (HCC) Markedly uncontrolled, needs endo management , now agrees since partner is having excellent results Leslie Lyons is reminded of the importance of commitment to daily physical activity for 30 minutes or more, as able and the need to limit carbohydrate intake to 30 to 60 grams per meal to help with blood sugar control.  The need to take medication as prescribed, test blood sugar as directed, and to call between visits if there is a concern that blood sugar is uncontrolled is also discussed.   Leslie Lyons is reminded of the importance of daily foot exam, annual eye examination, and good blood sugar, blood pressure and cholesterol control.  Diabetic Labs Latest Ref Rng & Units 10/03/2018 04/25/2018 01/06/2018 01/04/2018  06/24/2017  HbA1c <5.7 % of total Hgb 10.5(H) 6.5(H) - 8.6(H) 8.7(H)  Microalbumin mg/dL - - 1.9 - 52.7(H)  Micro/Creat Ratio <30 mcg/mg creat - - 20 - -  Chol <200 mg/dL 232(H) - - 293(H) 261(H)  HDL >50 mg/dL 53 - - 54 55  Calc LDL mg/dL (calc) 143(H) - - 192(H) 169(H)  Triglycerides <150 mg/dL 221(H) - - 263(H) 187(H)  Creatinine 0.50 - 1.05 mg/dL 1.08(H) 0.82 - 0.87 0.86   BP/Weight 10/03/2018 04/25/2018 01/04/2018 06/22/2017 06/16/2017 05/29/625 08/02/8545  Systolic BP 270 350 093 818 299 371 696  Diastolic BP 94 88 92 96 97 90 84  Wt. (Lbs) 235 228 235 240 236.4 236 218  BMI 41.63 40.39 41.63 42.51 43.24 41.81 38.63   Foot/eye exam completion dates 01/04/2018 06/22/2017  Foot Form Completion Done Done        Need for influenza vaccination After obtaining informed consent, the vaccine is  administered by LPN.   NASH (nonalcoholic steatohepatitis) Markedly elevated liver enzymes, importance of weight loss stressed also will be revferred to GI for follow up, no statin therapy at this time  Contraindication to statin medication Markedly elevated liver enzymes, statin therapy currently contraindicated  Back pain, chronic Increased and uncontrolled pan, toradol 60 mg IM in office

## 2018-10-25 NOTE — Assessment & Plan Note (Signed)
Symptomatic currently , topical preparation prescribed

## 2018-10-25 NOTE — Assessment & Plan Note (Signed)
Increased and uncontrolled pan, toradol 60 mg IM in office

## 2018-10-25 NOTE — Assessment & Plan Note (Signed)
Markedly uncontrolled, needs endo management , now agrees since partner is having excellent results Leslie Lyons is reminded of the importance of commitment to daily physical activity for 30 minutes or more, as able and the need to limit carbohydrate intake to 30 to 60 grams per meal to help with blood sugar control.   The need to take medication as prescribed, test blood sugar as directed, and to call between visits if there is a concern that blood sugar is uncontrolled is also discussed.   Leslie Lyons is reminded of the importance of daily foot exam, annual eye examination, and good blood sugar, blood pressure and cholesterol control.  Diabetic Labs Latest Ref Rng & Units 10/03/2018 04/25/2018 01/06/2018 01/04/2018 06/24/2017  HbA1c <5.7 % of total Hgb 10.5(H) 6.5(H) - 8.6(H) 8.7(H)  Microalbumin mg/dL - - 1.9 - 52.7(H)  Micro/Creat Ratio <30 mcg/mg creat - - 20 - -  Chol <200 mg/dL 232(H) - - 293(H) 261(H)  HDL >50 mg/dL 53 - - 54 55  Calc LDL mg/dL (calc) 143(H) - - 192(H) 169(H)  Triglycerides <150 mg/dL 221(H) - - 263(H) 187(H)  Creatinine 0.50 - 1.05 mg/dL 1.08(H) 0.82 - 0.87 0.86   BP/Weight 10/03/2018 04/25/2018 01/04/2018 06/22/2017 06/16/2017 02/01/3316 4/0/9927  Systolic BP 800 447 158 063 868 548 830  Diastolic BP 94 88 92 96 97 90 84  Wt. (Lbs) 235 228 235 240 236.4 236 218  BMI 41.63 40.39 41.63 42.51 43.24 41.81 38.63   Foot/eye exam completion dates 01/04/2018 06/22/2017  Foot Form Completion Done Done

## 2018-10-25 NOTE — Assessment & Plan Note (Signed)
Markedly elevated liver enzymes, importance of weight loss stressed also will be revferred to GI for follow up, no statin therapy at this time

## 2018-10-30 ENCOUNTER — Ambulatory Visit: Payer: Medicaid Other | Admitting: Gastroenterology

## 2018-10-31 ENCOUNTER — Ambulatory Visit (INDEPENDENT_AMBULATORY_CARE_PROVIDER_SITE_OTHER): Payer: Medicare Other | Admitting: Gastroenterology

## 2018-10-31 ENCOUNTER — Encounter: Payer: Self-pay | Admitting: Gastroenterology

## 2018-10-31 VITALS — BP 150/86 | HR 114 | Temp 97.1°F | Ht 63.0 in | Wt 239.4 lb

## 2018-10-31 DIAGNOSIS — R945 Abnormal results of liver function studies: Secondary | ICD-10-CM

## 2018-10-31 DIAGNOSIS — R7989 Other specified abnormal findings of blood chemistry: Secondary | ICD-10-CM | POA: Insufficient documentation

## 2018-10-31 DIAGNOSIS — K5900 Constipation, unspecified: Secondary | ICD-10-CM | POA: Diagnosis not present

## 2018-10-31 NOTE — Patient Instructions (Signed)
1. Start Miralax one packet once daily until soft bowel movements, then continue once daily as needed. You can take chronically. Once you get your Medicaid back and have medication coverage, we can call in a prescription medication for constipation. Let me know! 2. Please have your labs done. 3. Return to the office in three months. We will schedule you for your colonoscopy after that visit.    Fatty Liver Fatty liver, also called hepatic steatosis or steatohepatitis, is a condition in which too much fat has built up in your liver cells. The liver removes harmful substances from your bloodstream. It produces fluids your body needs. It also helps your body use and store energy from the food you eat. In many cases, fatty liver does not cause symptoms or problems. It is often diagnosed when tests are being done for other reasons. However, over time, fatty liver can cause inflammation that may lead to more serious liver problems, such as scarring of the liver (cirrhosis). What are the causes? Causes of fatty liver may include:  Drinking too much alcohol.  Poor nutrition.  Obesity.  Cushing syndrome.  Diabetes.  Hyperlipidemia.  Pregnancy.  Certain drugs.  Poisons.  Some viral infections.  What increases the risk? You may be more likely to develop fatty liver if you:  Abuse alcohol.  Are pregnant.  Are overweight.  Have diabetes.  Have hepatitis.  Have a high triglyceride level.  What are the signs or symptoms? Fatty liver often does not cause any symptoms. In cases where symptoms develop, they can include:  Fatigue.  Weakness.  Weight loss.  Confusion.  Abdominal pain.  Yellowing of your skin and the white parts of your eyes (jaundice).  Nausea and vomiting.  How is this diagnosed? Fatty liver may be diagnosed by:  Physical exam and medical history.  Blood tests.  Imaging tests, such as an ultrasound, CT scan, or MRI.  Liver biopsy. A small sample  of liver tissue is removed using a needle. The sample is then looked at under a microscope.  How is this treated? Fatty liver is often caused by other health conditions. Treatment for fatty liver may involve medicines and lifestyle changes to manage conditions such as:  Alcoholism.  High cholesterol.  Diabetes.  Being overweight or obese.  Follow these instructions at home:  Eat a healthy diet as directed by your health care provider.  Exercise regularly. This can help you lose weight and control your cholesterol and diabetes. Talk to your health care provider about an exercise plan and which activities are best for you.  Do not drink alcohol.  Take medicines only as directed by your health care provider. Contact a health care provider if: You have difficulty controlling your:  Blood sugar.  Cholesterol.  Alcohol consumption.  Get help right away if:  You have abdominal pain.  You have jaundice.  You have nausea and vomiting. This information is not intended to replace advice given to you by your health care provider. Make sure you discuss any questions you have with your health care provider. Document Released: 12/31/2005 Document Revised: 04/22/2016 Document Reviewed: 03/27/2014 Elsevier Interactive Patient Education  [employer].

## 2018-10-31 NOTE — Progress Notes (Signed)
Primary Care Physician:  [name], MD  Primary Gastroenterologist:  Barney Drain, MD   Chief Complaint  Patient presents with  . Abdominal Pain  . abnormal liver enzymes    HPI:  Leslie Lyons is a 57 y.o. female here at the request of Dr. Moshe Cipro for further evaluation of transaminitis.  She has had a couple referrals to the last several months but did not complete office visits.  Looking back through epic her examinations have been elevated at least back to 2017.  Last normal values were in June 2015.  In June 2017 her AST was 46,ALT 62.  In July 2018 her AST was 207, ALT 116.  May 2019 AST 93, ALT 80 this year AST 232, ALT 110.  See chart below.  Hepatitis A IgM, hepatitis B surface antigen, hepatitis B core IgM, hepatitis C antibody negative.  Patient admits to significant weight gain over the years.  In June 2015 she weighed 205 and currently today she is up to 239 which is been essentially the same of the past year.  Her diabetes has been poorly controlled.  Recently started on insulin, Increased glipizide from 5-71m.  Her LDL is 143, total cholesterol 232.  Back in February her cholesterol was 293, LDL 192.  She was started on Zetia.  Right upper quadrant ultrasound in February suggesting steatosis.  Patient is not sure whether or not she is still on Zetia.  Patient has vague bloating.  Belching.  Some heartburn.  Controlled with over-the-counter antacids.  Eats prunes which will help her constipation without them she has a hard time having a bowel movement.  No melena.  Some bright red blood per rectum she feels is related to hemorrhoids.  She reports that she avoids fried foods.  Has decreased her candy consumption.  Drinks sugary beverages however.  Trying to cut back.   EGD/TCS 2010 dr. JArnoldo MoraleWAS AWAKE.  Wants to wait on updating her colonoscopy after next office visit.  Will need propofol.   Component     Latest Ref Rng & Units 05/12/2016 06/24/2017 01/04/2018  04/25/2018           10:48 AM  Globulin     1.9 - 3.7 g/dL (calc)   3.9 (H) 3.6  AG Ratio     1.0 - 2.5 (calc)   1.2 1.3  Total Bilirubin     0.2 - 1.2 mg/dL 0.3 0.4 0.5 0.3  Alkaline phosphatase (APISO)     33 - 130 U/L   109 78  AST     10 - 35 U/L 46 (H) 207 (H) 262 (H) 93 (H)  ALT     6 - 29 U/L 62 (H) 116 (H) 116 (H) 80 (H)   Component     Latest Ref Rng & Units 04/25/2018 10/03/2018        10:48 AM   Globulin     1.9 - 3.7 g/dL (calc) 3.6 4.2 (H)  AG Ratio     1.0 - 2.5 (calc) 1.3 1.1  Total Bilirubin     0.2 - 1.2 mg/dL 0.3 0.3  Alkaline phosphatase (APISO)     33 - 130 U/L 78 115  AST     10 - 35 U/L 93 (H) 232 (H)  ALT     6 - 29 U/L 80 (H) 110 (H)    Current Outpatient Medications  Medication Sig Dispense Refill  . ACCU-CHEK FASTCLIX LANCETS MISC Twice daily testing dx e11.9  100 each 0  . albuterol (PROAIR HFA) 108 (90 Base) MCG/ACT inhaler Inhale 2 puffs into the lungs every 6 (six) hours as needed. 1 Inhaler 3  . ALPRAZolam (XANAX) 1 MG tablet Take 1 tablet (1 mg total) by mouth 2 (two) times daily. 60 tablet 1  . blood glucose meter kit and supplies Dispense based on patient and insurance preference. Once daily testing dx e11.9 1 each 0  . ezetimibe (ZETIA) 10 MG tablet Take 1 tablet (10 mg total) by mouth daily. (Patient taking differently: Take 10 mg by mouth daily. ) 90 tablet 3  . glipiZIDE (GLUCOTROL XL) 10 MG 24 hr tablet Take 1 tablet (10 mg total) by mouth daily with breakfast. 90 tablet 1  . glucose blood (ACCU-CHEK GUIDE) test strip Use as instructed twice daily dx e11.9 100 each 0  . Insulin Glargine (LANTUS SOLOSTAR) 100 UNIT/ML Solostar Pen Inject 10 Units into the skin daily. 5 pen 5  . lisinopril (PRINIVIL,ZESTRIL) 10 MG tablet Take 1 tablet (10 mg total) by mouth daily. 30 tablet 3  . mometasone (NASONEX) 50 MCG/ACT nasal spray Place 2 sprays into the nose daily. 51 g 3  . mometasone-formoterol (DULERA) 200-5 MCG/ACT AERO Inhale 2 puffs into the  lungs 2 (two) times daily. 3 Inhaler 3  . tolterodine (DETROL LA) 2 MG 24 hr capsule Take 1 capsule (2 mg total) by mouth daily. (Patient taking differently: Take 2 mg by mouth daily. urination) 90 capsule 3  . venlafaxine XR (EFFEXOR-XR) 37.5 MG 24 hr capsule Take 1 capsule (37.5 mg total) by mouth daily with breakfast. (Patient taking differently: Take 37.5 mg by mouth daily with breakfast. ) 90 capsule 3   No current facility-administered medications for this visit.     Allergies as of 10/31/2018 - Review Complete 10/31/2018  Allergen Reaction Noted  . Janumet [sitagliptin-metformin hcl]  10/03/2018  . Robaxin [methocarbamol] Swelling 07/31/2013    Past Medical History:  Diagnosis Date  . Abnormal transaminases   . Anxiety   . Bronchial asthma    with acute exacerbation  . Cancer ([place]) 1980   cervical, diag  at age 54  . Depression   . Diabetes ([place])   . Headache(784.0)    with reduced vision in right eye for one month   . Hyperlipidemia   . Hypertension   . Obesity     Past Surgical History:  Procedure Laterality Date  . ABDOMINAL HYSTERECTOMY    . COLONOSCOPY WITH ESOPHAGOGASTRODUODENOSCOPY (EGD)  11/2008   Dr. Arnoldo Morale: Conscious sedation.  Hiatal hernia.  Hemorrhoids. PATIENT REPORTS BEING AWAKE DURING PROCEDURE  . CRYOTHERAPY     for abnormal pap  . HEMORRHOID SURGERY     dr. Irving Shows  . PARTIAL HYSTERECTOMY      Family History  Problem Relation Age of Onset  . Cancer Mother        lung   . COPD Father        emphysema  . Hypertension Father   . Cancer Maternal Aunt        breast  . Cancer Paternal Uncle   . Colon cancer Neg Hx     Social History   Socioeconomic History  . Marital status: Married    Spouse name: Not on file  . Number of children: 2  . Years of education: Not on file  . Highest education level: Not on file  Occupational History  . Occupation: homemaker   Social Needs  . Emergency planning/management officer  strain: Not on file  . Food  insecurity:    Worry: Not on file    Inability: Not on file  . Transportation needs:    Medical: Not on file    Non-medical: Not on file  Tobacco Use  . Smoking status: Former Smoker    Packs/day: 0.25    Years: 2.00    Pack years: 0.50    Types: Cigarettes    Last attempt to quit: 12/04/2015    Years since quitting: 2.9  . Smokeless tobacco: Never Used  Substance and Sexual Activity  . Alcohol use: No  . Drug use: No  . Sexual activity: Not Currently  Lifestyle  . Physical activity:    Days per week: Not on file    Minutes per session: Not on file  . Stress: Not on file  Relationships  . Social connections:    Talks on phone: Not on file    Gets together: Not on file    Attends religious service: Not on file    Active member of club or organization: Not on file    Attends meetings of clubs or organizations: Not on file    Relationship status: Not on file  . Intimate partner violence:    Fear of current or ex partner: Not on file    Emotionally abused: Not on file    Physically abused: Not on file    Forced sexual activity: Not on file  Other Topics Concern  . Not on file  Social History Narrative  . Not on file      ROS:  General: Negative for anorexia, weight loss, fever, chills, fatigue, weakness. Eyes: Negative for vision changes.  ENT: Negative for hoarseness, difficulty swallowing , nasal congestion. CV: Negative for chest pain, angina, palpitations, dyspnea on exertion, peripheral edema.  Respiratory: Negative for dyspnea at rest, dyspnea on exertion, cough, sputum, wheezing.  GI: See history of present illness. GU:  Negative for dysuria, hematuria, urinary incontinence, urinary frequency, nocturnal urination.  MS: Negative for joint pain, low back pain.  Derm: Negative for rash or itching.  Neuro: Negative for weakness, abnormal sensation, seizure, frequent headaches, memory loss, confusion.  Psych: Negative for anxiety, depression, suicidal ideation,  hallucinations.  Endo: Negative for unusual weight change.  Heme: Negative for bruising or bleeding. Allergy: Negative for rash or hives.    Physical Examination:  BP (!) 150/86   Pulse (!) 114   Temp (!) 97.1 F (36.2 C) (Oral)   Ht 5' 3"  (1.6 m)   Wt 239 lb 6.4 oz (108.6 kg)   BMI 42.41 kg/m    General: Well-nourished, well-developed in no acute distress.  Head: Normocephalic, atraumatic.   Eyes: Conjunctiva pink, no icterus. Mouth: Oropharyngeal mucosa moist and pink , no lesions erythema or exudate. Neck: Supple without thyromegaly, masses, or lymphadenopathy.  Lungs: Clear to auscultation bilaterally.  Heart: Regular rate and rhythm, no murmurs rubs or gallops.  Abdomen: Bowel sounds are normal, nontender, nondistended, no hepatosplenomegaly or masses, no abdominal bruits or    hernia , no rebound or guarding.   Rectal: Not performed Extremities: No lower extremity edema. No clubbing or deformities.  Neuro: Alert and oriented x 4 , grossly normal neurologically.  Skin: Warm and dry, no rash or jaundice.   Psych: Alert and cooperative, normal mood and affect.  Labs: Lab Results  Component Value Date   HGBA1C 10.5 (H) 10/03/2018   Lab Results  Component Value Date   CREATININE 1.08 (H) 10/03/2018  BUN 10 10/03/2018   NA 138 10/03/2018   K 4.0 10/03/2018   CL 95 (L) 10/03/2018   CO2 31 10/03/2018   Lab Results  Component Value Date   CREATININE 1.08 (H) 10/03/2018   BUN 10 10/03/2018   NA 138 10/03/2018   K 4.0 10/03/2018   CL 95 (L) 10/03/2018   CO2 31 10/03/2018   Lab Results  Component Value Date   ALT 110 (H) 10/03/2018   AST 232 (H) 10/03/2018   ALKPHOS 105 06/24/2017   BILITOT 0.3 10/03/2018   No results found for: INR, PROTIME Lab Results  Component Value Date   TSH 1.80 01/04/2018   Lab Results  Component Value Date   WBC 8.2 01/04/2018   HGB 13.9 01/04/2018   HCT 41.6 01/04/2018   MCV 84.0 01/04/2018   PLT CANCELED 01/04/2018      CLINICAL DATA:  Initial evaluation for elevated LFTs.  EXAM: ULTRASOUND ABDOMEN LIMITED RIGHT UPPER QUADRANT  COMPARISON:  None.  FINDINGS: Gallbladder:  No gallstones or wall thickening visualized. No sonographic Murphy sign noted by sonographer.  Common bile duct:  Diameter: 2.5 mm  Liver:  No focal lesion identified. Diffusely increased echogenicity. Portal vein is patent on color Doppler imaging with normal direction of blood flow towards the liver.  IMPRESSION: 1. Diffusely increased hepatic echogenicity, suggesting steatosis. 2. Normal sonographic appearance of the gallbladder. No biliary dilatation.   Electronically Signed   By: Jeannine Boga M.D.   On: 01/25/2018 16:00   Imaging Studies: No results found.

## 2018-11-01 ENCOUNTER — Encounter: Payer: Self-pay | Admitting: Gastroenterology

## 2018-11-01 NOTE — Assessment & Plan Note (Signed)
Add MiraLAX 17 g daily in the evening on day she does not have adequate bowel movement.  Scheduled for colonoscopy after next office visit.

## 2018-11-01 NOTE — Assessment & Plan Note (Signed)
Chronically elevated transaminases with significant fluctuations, AST jumped from 93 up to 232, 80 up to 110 from May to current.  Similar significant elevations in 2018 as well.  Remotely had been on Lipitor.  Started on Zetia earlier this year.  Still listed on patient's medication list although she is not clear whether she is taking it.  Patient denies alcohol use.  Although transaminitis may be related to [place], other etiologies need to be excluded.  Check for autoimmune hepatitis, hemochromatosis.  Further recommendations to follow.  Discussed need for increased physical activity, weight reduction, tighter glycemic control in the setting of likely fatty liver.

## 2018-11-01 NOTE — Progress Notes (Signed)
cc'ed to pcp °

## 2018-11-03 ENCOUNTER — Telehealth: Payer: Self-pay

## 2018-11-03 DIAGNOSIS — R945 Abnormal results of liver function studies: Secondary | ICD-10-CM | POA: Diagnosis not present

## 2018-11-03 DIAGNOSIS — K625 Hemorrhage of anus and rectum: Secondary | ICD-10-CM | POA: Diagnosis not present

## 2018-11-03 DIAGNOSIS — K5900 Constipation, unspecified: Secondary | ICD-10-CM | POA: Diagnosis not present

## 2018-11-03 NOTE — Telephone Encounter (Signed)
Pt is at the lab for Lab work. Vaughan Basta from [facility] is asking for another code for the iron studies. She said the abnormal lfts wont cover the iron studies. The test is over $200.00. Per [facility] Rectal bleeding or abdominal pain.

## 2018-11-07 LAB — IGG, IGA, IGM
IgG (Immunoglobin G), Serum: 1660 mg/dL — ABNORMAL HIGH (ref 600–1640)
IgM, Serum: 101 mg/dL (ref 50–300)
Immunoglobulin A: 499 mg/dL — ABNORMAL HIGH (ref 47–310)

## 2018-11-07 LAB — CBC WITH DIFFERENTIAL/PLATELET
Basophils Absolute: 69 cells/uL (ref 0–200)
Basophils Relative: 0.8 %
Eosinophils Absolute: 198 cells/uL (ref 15–500)
Eosinophils Relative: 2.3 %
HCT: 40.8 % (ref 35.0–45.0)
Hemoglobin: 13.4 g/dL (ref 11.7–15.5)
LYMPHS ABS: 4317 {cells}/uL — AB (ref 850–3900)
MCH: 28.2 pg (ref 27.0–33.0)
MCHC: 32.8 g/dL (ref 32.0–36.0)
MCV: 85.7 fL (ref 80.0–100.0)
MONOS PCT: 6.2 %
MPV: 9.2 fL (ref 7.5–12.5)
NEUTROS ABS: 3483 {cells}/uL (ref 1500–7800)
Neutrophils Relative %: 40.5 %
PLATELETS: 281 10*3/uL (ref 140–400)
RBC: 4.76 10*6/uL (ref 3.80–5.10)
RDW: 12.9 % (ref 11.0–15.0)
Total Lymphocyte: 50.2 %
WBC mixed population: 533 cells/uL (ref 200–950)
WBC: 8.6 10*3/uL (ref 3.8–10.8)

## 2018-11-07 LAB — IRON,TIBC AND FERRITIN PANEL
%SAT: 23 % (ref 16–45)
FERRITIN: 515 ng/mL — AB (ref 16–232)
Iron: 95 ug/dL (ref 45–160)
TIBC: 410 mcg/dL (calc) (ref 250–450)

## 2018-11-07 LAB — MITOCHONDRIAL ANTIBODIES

## 2018-11-07 LAB — ANTI-NUCLEAR AB-TITER (ANA TITER): ANA TITER: 1:80 {titer} — ABNORMAL HIGH

## 2018-11-07 LAB — ANTI-SMOOTH MUSCLE ANTIBODY, IGG

## 2018-11-07 LAB — ANA: Anti Nuclear Antibody(ANA): POSITIVE — AB

## 2018-11-15 DIAGNOSIS — G43711 Chronic migraine without aura, intractable, with status migrainosus: Secondary | ICD-10-CM | POA: Diagnosis not present

## 2018-11-15 DIAGNOSIS — R2689 Other abnormalities of gait and mobility: Secondary | ICD-10-CM | POA: Diagnosis not present

## 2018-11-15 DIAGNOSIS — M545 Low back pain: Secondary | ICD-10-CM | POA: Diagnosis not present

## 2018-11-15 DIAGNOSIS — G5603 Carpal tunnel syndrome, bilateral upper limbs: Secondary | ICD-10-CM | POA: Diagnosis not present

## 2018-11-15 NOTE — Progress Notes (Signed)
LMOM to call.

## 2018-11-16 NOTE — Progress Notes (Signed)
PT is aware we will call her after [facility] consults with Dr. Oneida Alar.

## 2018-11-27 ENCOUNTER — Ambulatory Visit: Payer: Medicare Other | Admitting: Nutrition

## 2018-11-27 ENCOUNTER — Telehealth: Payer: Self-pay | Admitting: Nutrition

## 2018-11-27 NOTE — Telephone Encounter (Signed)
VM left to call and reschedule missed appt.

## 2018-11-28 ENCOUNTER — Encounter: Payer: Medicare Other | Attending: Family Medicine | Admitting: Nutrition

## 2018-11-28 ENCOUNTER — Ambulatory Visit (INDEPENDENT_AMBULATORY_CARE_PROVIDER_SITE_OTHER): Payer: Medicare Other | Admitting: "Endocrinology

## 2018-11-28 ENCOUNTER — Encounter: Payer: Self-pay | Admitting: "Endocrinology

## 2018-11-28 VITALS — BP 144/87 | HR 102 | Ht 63.0 in | Wt 234.0 lb

## 2018-11-28 DIAGNOSIS — N182 Chronic kidney disease, stage 2 (mild): Secondary | ICD-10-CM

## 2018-11-28 DIAGNOSIS — I10 Essential (primary) hypertension: Secondary | ICD-10-CM

## 2018-11-28 DIAGNOSIS — E1122 Type 2 diabetes mellitus with diabetic chronic kidney disease: Secondary | ICD-10-CM

## 2018-11-28 DIAGNOSIS — E559 Vitamin D deficiency, unspecified: Secondary | ICD-10-CM | POA: Diagnosis not present

## 2018-11-28 DIAGNOSIS — Z794 Long term (current) use of insulin: Secondary | ICD-10-CM

## 2018-11-28 DIAGNOSIS — E782 Mixed hyperlipidemia: Secondary | ICD-10-CM | POA: Diagnosis not present

## 2018-11-28 MED ORDER — INSULIN GLARGINE 100 UNIT/ML SOLOSTAR PEN: 20.0000 [IU] | PEN_INJECTOR | Freq: Every day | SUBCUTANEOUS | 5 refills | Status: DC

## 2018-11-28 MED ORDER — VITAMIN D3 125 MCG (5000 UT) PO CAPS: 5000.0000 [IU] | ORAL_CAPSULE | Freq: Every day | ORAL | 0 refills | Status: DC

## 2018-11-28 NOTE — Patient Instructions (Signed)

## 2018-11-28 NOTE — Progress Notes (Signed)
Endocrinology Consult Note       11/28/2018, 1:04 PM   Subjective:    Patient ID: Leslie Lyons, female    DOB: 10-16-1961.  Leslie Lyons is being seen in consultation for management of currently uncontrolled symptomatic diabetes requested by  [name], MD.   Past Medical History:  Diagnosis Date  . Abnormal transaminases   . Anxiety   . Bronchial asthma    with acute exacerbation  . Cancer ([place]) 1980   cervical, diag  at age 57  . Depression   . Diabetes ([place])   . Headache(784.0)    with reduced vision in right eye for one month   . Hyperlipidemia   . Hypertension   . Obesity    Past Surgical History:  Procedure Laterality Date  . ABDOMINAL HYSTERECTOMY    . COLONOSCOPY WITH ESOPHAGOGASTRODUODENOSCOPY (EGD)  11/2008   Dr. Arnoldo Morale: Conscious sedation.  Hiatal hernia.  Hemorrhoids. PATIENT REPORTS BEING AWAKE DURING PROCEDURE  . CRYOTHERAPY     for abnormal pap  . HEMORRHOID SURGERY     dr. Irving Shows  . PARTIAL HYSTERECTOMY     Social History   Socioeconomic History  . Marital status: Married    Spouse name: Not on file  . Number of children: 2  . Years of education: Not on file  . Highest education level: Not on file  Occupational History  . Occupation: homemaker   Social Needs  . Financial resource strain: Not on file  . Food insecurity:    Worry: Not on file    Inability: Not on file  . Transportation needs:    Medical: Not on file    Non-medical: Not on file  Tobacco Use  . Smoking status: Former Smoker    Packs/day: 0.25    Years: 2.00    Pack years: 0.50    Types: Cigarettes    Last attempt to quit: 12/04/2015    Years since quitting: 2.9  . Smokeless tobacco: Never Used  Substance and Sexual Activity  . Alcohol use: No  . Drug use: No  . Sexual activity: Not Currently  Lifestyle  . Physical activity:    Days per week: Not on file    Minutes  per session: Not on file  . Stress: Not on file  Relationships  . Social connections:    Talks on phone: Not on file    Gets together: Not on file    Attends religious service: Not on file    Active member of club or organization: Not on file    Attends meetings of clubs or organizations: Not on file    Relationship status: Not on file  Other Topics Concern  . Not on file  Social History Narrative  . Not on file   Outpatient Encounter Medications as of 11/28/2018  Medication Sig  . DULoxetine (CYMBALTA) 30 MG capsule Take 30 mg by mouth daily.  . rizatriptan (MAXALT) 10 MG tablet Take 10 mg by mouth as needed for migraine. May repeat in 2 hours if needed  . ACCU-CHEK FASTCLIX LANCETS MISC Twice daily testing dx e11.9  .  albuterol (PROAIR HFA) 108 (90 Base) MCG/ACT inhaler Inhale 2 puffs into the lungs every 6 (six) hours as needed.  . ALPRAZolam (XANAX) 1 MG tablet Take 1 tablet (1 mg total) by mouth 2 (two) times daily.  . blood glucose meter kit and supplies Dispense based on patient and insurance preference. Once daily testing dx e11.9  . Cholecalciferol (VITAMIN D3) 125 MCG (5000 UT) CAPS Take 1 capsule (5,000 Units total) by mouth daily.  Marland Kitchen ezetimibe (ZETIA) 10 MG tablet Take 1 tablet (10 mg total) by mouth daily. (Patient taking differently: Take 10 mg by mouth daily. )  . glipiZIDE (GLUCOTROL XL) 10 MG 24 hr tablet Take 1 tablet (10 mg total) by mouth daily with breakfast.  . glucose blood (ACCU-CHEK GUIDE) test strip Use as instructed twice daily dx e11.9  . Insulin Glargine (LANTUS SOLOSTAR) 100 UNIT/ML Solostar Pen Inject 20 Units into the skin at bedtime.  Marland Kitchen lisinopril (PRINIVIL,ZESTRIL) 10 MG tablet Take 1 tablet (10 mg total) by mouth daily.  . mometasone (NASONEX) 50 MCG/ACT nasal spray Place 2 sprays into the nose daily.  . mometasone-formoterol (DULERA) 200-5 MCG/ACT AERO Inhale 2 puffs into the lungs 2 (two) times daily.  Marland Kitchen tolterodine (DETROL LA) 2 MG 24 hr capsule  Take 1 capsule (2 mg total) by mouth daily. (Patient taking differently: Take 2 mg by mouth daily. urination)  . venlafaxine XR (EFFEXOR-XR) 37.5 MG 24 hr capsule Take 1 capsule (37.5 mg total) by mouth daily with breakfast. (Patient taking differently: Take 37.5 mg by mouth daily with breakfast. )  . [DISCONTINUED] Insulin Glargine (LANTUS SOLOSTAR) 100 UNIT/ML Solostar Pen Inject 10 Units into the skin daily.   No facility-administered encounter medications on file as of 11/28/2018.     ALLERGIES: Allergies  Allergen Reactions  . Janumet [Sitagliptin-Metformin Hcl]     States she vomits up the whole pill after taking this  . Robaxin [Methocarbamol] Swelling    VACCINATION STATUS: Immunization History  Administered Date(s) Administered  . Influenza Whole 09/25/2007, 11/04/2009, 09/22/2010  . Influenza,inj,Quad PF,6+ Mos 08/09/2013, 07/22/2014, 11/17/2015, 01/04/2018, 10/03/2018  . Pneumococcal Conjugate-13 04/25/2018  . Pneumococcal Polysaccharide-23 07/31/2009, 07/05/2016  . Td 03/26/2009    Diabetes  She presents for her initial diabetic visit. She has type 2 diabetes mellitus. Onset time: She was diagnosed at approximate age of 23 years. Her disease course has been worsening. There are no hypoglycemic associated symptoms. Pertinent negatives for hypoglycemia include no confusion, headaches, pallor or seizures. Associated symptoms include fatigue, polydipsia and polyuria. Pertinent negatives for diabetes include no chest pain and no polyphagia. There are no hypoglycemic complications. Symptoms are worsening. Diabetic complications include nephropathy. Risk factors for coronary artery disease include dyslipidemia, family history, hypertension, obesity, sedentary lifestyle, tobacco exposure and post-menopausal. Current diabetic treatment includes insulin injections and oral agent (monotherapy). Her weight is fluctuating minimally. She is following a generally unhealthy diet. When asked  about meal planning, she reported none. She has not had a previous visit with a dietitian. She rarely participates in exercise. (She did not bring any meter nor logs to review.  Her most recent A1c was 10.5% on October 03, 2018.) An ACE inhibitor/angiotensin II receptor blocker is being taken.  Hyperlipidemia  This is a chronic problem. The current episode started more than 1 year ago. The problem is uncontrolled. Exacerbating diseases include diabetes and obesity. Pertinent negatives include no chest pain, myalgias or shortness of breath. Current antihyperlipidemic treatment includes ezetimibe. Risk factors for coronary artery disease include  dyslipidemia, diabetes mellitus, family history, obesity, hypertension, post-menopausal and a sedentary lifestyle.  Hypertension  This is a chronic problem. The problem is uncontrolled. Pertinent negatives include no chest pain, headaches, palpitations or shortness of breath. Risk factors for coronary artery disease include dyslipidemia, diabetes mellitus, family history, obesity, post-menopausal state, sedentary lifestyle and smoking/tobacco exposure. Past treatments include ACE inhibitors.     Review of Systems  Constitutional: Positive for fatigue. Negative for chills, fever and unexpected weight change.  HENT: Negative for trouble swallowing and voice change.   Eyes: Negative for visual disturbance.  Respiratory: Negative for cough, shortness of breath and wheezing.   Cardiovascular: Negative for chest pain, palpitations and leg swelling.  Gastrointestinal: Negative for diarrhea, nausea and vomiting.  Endocrine: Positive for polydipsia and polyuria. Negative for cold intolerance, heat intolerance and polyphagia.  Musculoskeletal: Negative for arthralgias and myalgias.  Skin: Negative for color change, pallor, rash and wound.  Neurological: Negative for seizures and headaches.  Psychiatric/Behavioral: Negative for confusion and suicidal ideas.     Objective:    BP (!) 144/87   Pulse (!) 102   Ht 5' 3"  (1.6 m)   Wt 234 lb (106.1 kg)   BMI 41.45 kg/m   Wt Readings from Last 3 Encounters:  11/28/18 234 lb (106.1 kg)  10/31/18 239 lb 6.4 oz (108.6 kg)  10/03/18 235 lb (106.6 kg)     Physical Exam Constitutional:      Appearance: She is well-developed.  HENT:     Head: Normocephalic and atraumatic.  Neck:     Musculoskeletal: Normal range of motion and neck supple.     Thyroid: No thyromegaly.     Trachea: No tracheal deviation.  Cardiovascular:     Rate and Rhythm: Normal rate and regular rhythm.  Pulmonary:     Effort: Pulmonary effort is normal.     Breath sounds: Normal breath sounds.  Abdominal:     General: Bowel sounds are normal.     Palpations: Abdomen is soft.     Tenderness: There is no abdominal tenderness. There is no guarding.  Musculoskeletal: Normal range of motion.  Skin:    General: Skin is warm and dry.     Coloration: Skin is not pale.     Findings: No erythema or rash.  Neurological:     Mental Status: She is alert and oriented to person, place, and time.     Cranial Nerves: No cranial nerve deficit.     Coordination: Coordination normal.     Deep Tendon Reflexes: Reflexes are normal and symmetric.  Psychiatric:        Judgment: Judgment normal.     CMP ( most recent) CMP     Component Value Date/Time   NA 138 10/03/2018 1107   K 4.0 10/03/2018 1107   CL 95 (L) 10/03/2018 1107   CO2 31 10/03/2018 1107   GLUCOSE 260 (H) 10/03/2018 1107   BUN 10 10/03/2018 1107   CREATININE 1.08 (H) 10/03/2018 1107   CALCIUM 10.0 10/03/2018 1107   PROT 8.9 (H) 10/03/2018 1107   ALBUMIN 4.2 06/24/2017 1239   AST 232 (H) 10/03/2018 1107   ALT 110 (H) 10/03/2018 1107   ALKPHOS 105 06/24/2017 1239   BILITOT 0.3 10/03/2018 1107   GFRNONAA 57 (L) 10/03/2018 1107   GFRAA 66 10/03/2018 1107     Diabetic Labs (most recent): Lab Results  Component Value Date   HGBA1C 10.5 (H) 10/03/2018    HGBA1C 6.5 (H) 04/25/2018  HGBA1C 8.6 (H) 01/04/2018     Lipid Panel ( most recent) Lipid Panel     Component Value Date/Time   CHOL 232 (H) 10/03/2018 1107   TRIG 221 (H) 10/03/2018 1107   HDL 53 10/03/2018 1107   CHOLHDL 4.4 10/03/2018 1107   VLDL 37 06/24/2017 1239   LDLCALC 143 (H) 10/03/2018 1107      Lab Results  Component Value Date   TSH 1.80 01/04/2018   TSH 2.75 05/12/2016   TSH 1.915 07/05/2013   TSH 0.829 06/25/2011   TSH 1.159 09/28/2010   TSH 1.824 03/26/2009       Assessment & Plan:   1. Type 2 diabetes mellitus with stage 2 chronic kidney disease, with long-term current use of insulin (HCC)  - Leslie Lyons has currently uncontrolled symptomatic type 2 DM since  57 years of age,  with most recent A1c of 10.5 %. Recent labs reviewed. - I had a long discussion with her about the progressive nature of diabetes and the pathology behind its complications. -her diabetes is complicated by renal insufficiency, obesity/sedentary life and she remains at a high risk for more acute and chronic complications which include CAD, CVA, CKD, retinopathy, and neuropathy. These are all discussed in detail with her.  - I have counseled her on diet management and weight loss, by adopting a carbohydrate restricted/protein rich diet.  - Suggestion is made for her to avoid simple carbohydrates  from her diet including Cakes, Sweet Desserts, Ice Cream, Soda (diet and regular), Sweet Tea, Candies, Chips, Cookies, Store Bought Juices, Alcohol in Excess of  1-2 drinks a day, Artificial Sweeteners,  Coffee Creamer, and "Sugar-free" Products. This will help patient to have more stable blood glucose profile and potentially avoid unintended weight gain.  - I encouraged her to switch to  unprocessed or minimally processed complex starch and increased protein intake (animal or plant source), fruits, and vegetables.  - she is advised to stick to a routine mealtimes to eat 3 meals  a day and  avoid unnecessary snacks ( to snack only to correct hypoglycemia).   - she will be scheduled with Jearld Fenton, RDN, CDE for individualized diabetes education.  - I have approached her with the following individualized plan to manage diabetes and patient agrees:   -Based on her presentation with significant glycemic burden, she may require intensive treatment with basal/bolus insulin in order for her to achieve and maintain control of diabetes to target. - However, her commitment for safe use of insulin has to be assured first. -She is advised to increase her Lantus to 20 units daily at bedtime, associated with strict monitoring of glucose 4 times a day-before meals and at bedtime. -She is asked to return in 1 week with her meter and logs for reevaluation. - she is warned not to take insulin without proper monitoring per orders.  - she is encouraged to call clinic for blood glucose levels less than 70 or above 300 mg /dl. - she is advised to continue glipizide 10 mg XL daily at breakfast, therapeutically suitable for patient .  - she is not a candidate for metformin, SGLT2 inhibitors due to concurrent renal insufficiency.  -She has several comorbidities including cervical cancer, history of significant smoking with COPD/bronchial asthma, not suitable candidate for incretin therapy.  - Patient specific target  A1c;  LDL, HDL, Triglycerides, and  Waist Circumference were discussed in detail.  2) Blood Pressure /Hypertension:  her blood pressure is uncontrolled to target.  she is advised to continue her current medications including lisinopril 10 mg p.o. daily with breakfast . 3) Lipids/Hyperlipidemia:   Review of her recent lipid panel showed uncontrolled  LDL at 143.  she  is advised to continue    Zetia 10 mg daily at bedtime.  Side effects and precautions discussed with her.  Will be considered for statin therapy if no contraindications on next visit.  4)  Weight/Diet:  Body mass index is  41.45 kg/m.  - clearly complicating her diabetes care.  I discussed with her the fact that loss of 5 - 10% of her  current body weight will have the most impact on her diabetes management.  CDE Consult will be initiated . Exercise, and detailed carbohydrates information provided  -  detailed on discharge instructions.  5) Chronic Care/Health Maintenance:  -she  is on ACEI/ARB and  is encouraged to initiate and continue to follow up with Ophthalmology, Dentist,  Podiatrist at least yearly or according to recommendations, and advised to  stay away from smoking. I have recommended yearly flu vaccine and pneumonia vaccine at least every 5 years; moderate intensity exercise for up to 150 minutes weekly; and  sleep for at least 7 hours a day.  - she is  advised to maintain close follow up with [name], MD for primary care needs, as well as her other providers for optimal and coordinated care.  - Time spent with the patient: 35 minutes, of which >50% was spent in obtaining information about her symptoms, reviewing her previous labs, evaluations, and treatments, counseling her about her currently uncontrolled type 2 diabetes, hyperlipidemia, hypertension, and developing plans for long term treatment based on the latest recommendations.  Leslie Lyons participated in the discussions, expressed understanding, and voiced agreement with the above plans.  All questions were answered to her satisfaction. she is encouraged to contact clinic should she have any questions or concerns prior to her return visit.  Follow up plan: - Return in about 1 week (around 12/05/2018) for Follow up with Meter and Logs Only - no Labs.  Glade Lloyd, MD [facility] [address] [place], [place] [address] Phone: [phone]  Fax: [phone]    11/28/2018, 1:04 PM  This note was partially dictated with voice recognition software. Similar sounding words can  be transcribed inadequately or may not  be corrected upon review.

## 2018-12-04 ENCOUNTER — Ambulatory Visit (INDEPENDENT_AMBULATORY_CARE_PROVIDER_SITE_OTHER): Payer: Medicare Other | Admitting: "Endocrinology

## 2018-12-04 ENCOUNTER — Encounter: Payer: Self-pay | Admitting: "Endocrinology

## 2018-12-04 ENCOUNTER — Other Ambulatory Visit: Payer: Self-pay | Admitting: Family Medicine

## 2018-12-04 ENCOUNTER — Telehealth: Payer: Self-pay | Admitting: *Deleted

## 2018-12-04 ENCOUNTER — Encounter: Payer: Medicare Other | Attending: Family Medicine | Admitting: Nutrition

## 2018-12-04 VITALS — Ht 63.0 in | Wt 238.0 lb

## 2018-12-04 DIAGNOSIS — Z794 Long term (current) use of insulin: Secondary | ICD-10-CM

## 2018-12-04 DIAGNOSIS — IMO0002 Reserved for concepts with insufficient information to code with codable children: Secondary | ICD-10-CM

## 2018-12-04 DIAGNOSIS — I10 Essential (primary) hypertension: Secondary | ICD-10-CM | POA: Diagnosis not present

## 2018-12-04 DIAGNOSIS — E669 Obesity, unspecified: Secondary | ICD-10-CM

## 2018-12-04 DIAGNOSIS — E118 Type 2 diabetes mellitus with unspecified complications: Secondary | ICD-10-CM | POA: Insufficient documentation

## 2018-12-04 DIAGNOSIS — N182 Chronic kidney disease, stage 2 (mild): Secondary | ICD-10-CM

## 2018-12-04 DIAGNOSIS — E1165 Type 2 diabetes mellitus with hyperglycemia: Secondary | ICD-10-CM | POA: Insufficient documentation

## 2018-12-04 DIAGNOSIS — E782 Mixed hyperlipidemia: Secondary | ICD-10-CM

## 2018-12-04 DIAGNOSIS — E1122 Type 2 diabetes mellitus with diabetic chronic kidney disease: Secondary | ICD-10-CM | POA: Diagnosis not present

## 2018-12-04 MED ORDER — INSULIN GLARGINE 100 UNIT/ML SOLOSTAR PEN: 25.0000 [IU] | PEN_INJECTOR | Freq: Every day | SUBCUTANEOUS | 5 refills | Status: DC

## 2018-12-04 NOTE — Progress Notes (Signed)
                                                              Endocrinology Consult Note       12/04/2018, 1:16 PM   Subjective:    Patient ID: Leslie Lyons, female    DOB: 05/30/1961.  Kamyiah S Casagrande is being seen in consultation for management of currently uncontrolled symptomatic diabetes requested by  Simpson, Margaret E, MD.   Past Medical History:  Diagnosis Date  . Abnormal transaminases   . Anxiety   . Bronchial asthma    with acute exacerbation  . Cancer (HCC) 1980   cervical, diag  at age 18  . Depression   . Diabetes (HCC)   . Headache(784.0)    with reduced vision in right eye for one month   . Hyperlipidemia   . Hypertension   . Obesity    Past Surgical History:  Procedure Laterality Date  . ABDOMINAL HYSTERECTOMY    . COLONOSCOPY WITH ESOPHAGOGASTRODUODENOSCOPY (EGD)  11/2008   Dr. Jenkins: Conscious sedation.  Hiatal hernia.  Hemorrhoids. PATIENT REPORTS BEING AWAKE DURING PROCEDURE  . CRYOTHERAPY     for abnormal pap  . HEMORRHOID SURGERY     dr. leroy smith  . PARTIAL HYSTERECTOMY     Social History   Socioeconomic History  . Marital status: Married    Spouse name: Not on file  . Number of children: 2  . Years of education: Not on file  . Highest education level: Not on file  Occupational History  . Occupation: homemaker   Social Needs  . Financial resource strain: Not on file  . Food insecurity:    Worry: Not on file    Inability: Not on file  . Transportation needs:    Medical: Not on file    Non-medical: Not on file  Tobacco Use  . Smoking status: Former Smoker    Packs/day: 0.25    Years: 2.00    Pack years: 0.50    Types: Cigarettes    Last attempt to quit: 12/04/2015    Years since quitting: 3.0  . Smokeless tobacco: Never Used  Substance and Sexual Activity  . Alcohol use: No  . Drug use: No  . Sexual activity: Not Currently  Lifestyle  . Physical activity:    Days per week: Not on file    Minutes  per session: Not on file  . Stress: Not on file  Relationships  . Social connections:    Talks on phone: Not on file    Gets together: Not on file    Attends religious service: Not on file    Active member of club or organization: Not on file    Attends meetings of clubs or organizations: Not on file    Relationship status: Not on file  Other Topics Concern  . Not on file  Social History Narrative  . Not on file   Outpatient Encounter Medications as of 12/04/2018  Medication Sig  . ACCU-CHEK FASTCLIX LANCETS MISC Twice daily testing dx e11.9  . albuterol (PROAIR HFA) 108 (90 Base) MCG/ACT inhaler Inhale 2 puffs into the lungs every 6 (six) hours as needed.  . ALPRAZolam (XANAX) 1 MG tablet Take 1 tablet (1 mg total) by mouth   2 (two) times daily.  . blood glucose meter kit and supplies Dispense based on patient and insurance preference. Once daily testing dx e11.9  . Cholecalciferol (VITAMIN D3) 125 MCG (5000 UT) CAPS Take 1 capsule (5,000 Units total) by mouth daily.  . DULoxetine (CYMBALTA) 30 MG capsule Take 30 mg by mouth daily.  . ezetimibe (ZETIA) 10 MG tablet Take 1 tablet (10 mg total) by mouth daily. (Patient taking differently: Take 10 mg by mouth daily. )  . glipiZIDE (GLUCOTROL XL) 10 MG 24 hr tablet Take 1 tablet (10 mg total) by mouth daily with breakfast.  . glucose blood (ACCU-CHEK GUIDE) test strip Use as instructed twice daily dx e11.9  . Insulin Glargine (LANTUS SOLOSTAR) 100 UNIT/ML Solostar Pen Inject 25 Units into the skin at bedtime.  . lisinopril (PRINIVIL,ZESTRIL) 10 MG tablet Take 1 tablet (10 mg total) by mouth daily.  . mometasone (NASONEX) 50 MCG/ACT nasal spray Place 2 sprays into the nose daily.  . mometasone-formoterol (DULERA) 200-5 MCG/ACT AERO Inhale 2 puffs into the lungs 2 (two) times daily.  . rizatriptan (MAXALT) 10 MG tablet Take 10 mg by mouth as needed for migraine. May repeat in 2 hours if needed  . tolterodine (DETROL LA) 2 MG 24 hr capsule Take  1 capsule (2 mg total) by mouth daily. (Patient taking differently: Take 2 mg by mouth daily. urination)  . venlafaxine XR (EFFEXOR-XR) 37.5 MG 24 hr capsule Take 1 capsule (37.5 mg total) by mouth daily with breakfast. (Patient taking differently: Take 37.5 mg by mouth daily with breakfast. )  . [DISCONTINUED] Insulin Glargine (LANTUS SOLOSTAR) 100 UNIT/ML Solostar Pen Inject 20 Units into the skin at bedtime.   No facility-administered encounter medications on file as of 12/04/2018.     ALLERGIES: Allergies  Allergen Reactions  . Janumet [Sitagliptin-Metformin Hcl]     States she vomits up the whole pill after taking this  . Robaxin [Methocarbamol] Swelling    VACCINATION STATUS: Immunization History  Administered Date(s) Administered  . Influenza Whole 09/25/2007, 11/04/2009, 09/22/2010  . Influenza,inj,Quad PF,6+ Mos 08/09/2013, 07/22/2014, 11/17/2015, 01/04/2018, 10/03/2018  . Pneumococcal Conjugate-13 04/25/2018  . Pneumococcal Polysaccharide-23 07/31/2009, 07/05/2016  . Td 03/26/2009    Diabetes  She presents for her follow-up diabetic visit. She has type 2 diabetes mellitus. Onset time: She was diagnosed at approximate age of 50 years. Her disease course has been improving. There are no hypoglycemic associated symptoms. Pertinent negatives for hypoglycemia include no confusion, headaches, pallor or seizures. Associated symptoms include fatigue. Pertinent negatives for diabetes include no chest pain, no polydipsia, no polyphagia and no polyuria. There are no hypoglycemic complications. Symptoms are improving. Diabetic complications include nephropathy. Risk factors for coronary artery disease include dyslipidemia, family history, hypertension, obesity, sedentary lifestyle, tobacco exposure and post-menopausal. Current diabetic treatment includes insulin injections and oral agent (monotherapy). Her weight is fluctuating minimally. She is following a generally unhealthy diet. When asked  about meal planning, she reported none. She has not had a previous visit with a dietitian. She rarely participates in exercise. Her breakfast blood glucose range is generally 130-140 mg/dl. Her lunch blood glucose range is generally 130-140 mg/dl. Her dinner blood glucose range is generally 130-140 mg/dl. Her bedtime blood glucose range is generally 130-140 mg/dl. Her overall blood glucose range is 130-140 mg/dl. (She presents with significantly improved glycemic profile since her last visit.  Her most recent A1c was 10.5% on October 03, 2018.) An ACE inhibitor/angiotensin II receptor blocker is being taken.  Hyperlipidemia    This is a chronic problem. The current episode started more than 1 year ago. The problem is uncontrolled. Exacerbating diseases include diabetes and obesity. Pertinent negatives include no chest pain, myalgias or shortness of breath. Current antihyperlipidemic treatment includes ezetimibe. Risk factors for coronary artery disease include dyslipidemia, diabetes mellitus, family history, obesity, hypertension, post-menopausal and a sedentary lifestyle.  Hypertension  This is a chronic problem. The problem is uncontrolled. Pertinent negatives include no chest pain, headaches, palpitations or shortness of breath. Risk factors for coronary artery disease include dyslipidemia, diabetes mellitus, family history, obesity, post-menopausal state, sedentary lifestyle and smoking/tobacco exposure. Past treatments include ACE inhibitors.     Review of Systems  Constitutional: Positive for fatigue. Negative for chills, fever and unexpected weight change.  HENT: Negative for trouble swallowing and voice change.   Eyes: Negative for visual disturbance.  Respiratory: Negative for cough, shortness of breath and wheezing.   Cardiovascular: Negative for chest pain, palpitations and leg swelling.  Gastrointestinal: Negative for diarrhea, nausea and vomiting.  Endocrine: Negative for cold intolerance,  heat intolerance, polydipsia, polyphagia and polyuria.  Musculoskeletal: Negative for arthralgias and myalgias.  Skin: Negative for color change, pallor, rash and wound.  Neurological: Negative for seizures and headaches.  Psychiatric/Behavioral: Negative for confusion and suicidal ideas.    Objective:    Ht 5' 3" (1.6 m)   Wt 238 lb (108 kg)   BMI 42.16 kg/m   Wt Readings from Last 3 Encounters:  12/04/18 238 lb (108 kg)  12/04/18 238 lb (108 kg)  11/28/18 234 lb (106.1 kg)     Physical Exam Constitutional:      Appearance: She is well-developed.  HENT:     Head: Normocephalic and atraumatic.  Neck:     Musculoskeletal: Normal range of motion and neck supple.     Thyroid: No thyromegaly.     Trachea: No tracheal deviation.  Cardiovascular:     Rate and Rhythm: Normal rate and regular rhythm.  Pulmonary:     Effort: Pulmonary effort is normal.     Breath sounds: Normal breath sounds.  Abdominal:     General: Bowel sounds are normal.     Palpations: Abdomen is soft.     Tenderness: There is no abdominal tenderness. There is no guarding.  Musculoskeletal: Normal range of motion.  Skin:    General: Skin is warm and dry.     Coloration: Skin is not pale.     Findings: No erythema or rash.  Neurological:     Mental Status: She is alert and oriented to person, place, and time.     Cranial Nerves: No cranial nerve deficit.     Coordination: Coordination normal.     Deep Tendon Reflexes: Reflexes are normal and symmetric.  Psychiatric:        Judgment: Judgment normal.     CMP ( most recent) CMP     Component Value Date/Time   NA 138 10/03/2018 1107   K 4.0 10/03/2018 1107   CL 95 (L) 10/03/2018 1107   CO2 31 10/03/2018 1107   GLUCOSE 260 (H) 10/03/2018 1107   BUN 10 10/03/2018 1107   CREATININE 1.08 (H) 10/03/2018 1107   CALCIUM 10.0 10/03/2018 1107   PROT 8.9 (H) 10/03/2018 1107   ALBUMIN 4.2 06/24/2017 1239   AST 232 (H) 10/03/2018 1107   ALT 110 (H)  10/03/2018 1107   ALKPHOS 105 06/24/2017 1239   BILITOT 0.3 10/03/2018 1107   GFRNONAA 57 (L) 10/03/2018 1107  GFRAA 66 10/03/2018 1107     Diabetic Labs (most recent): Lab Results  Component Value Date   HGBA1C 10.5 (H) 10/03/2018   HGBA1C 6.5 (H) 04/25/2018   HGBA1C 8.6 (H) 01/04/2018     Lipid Panel ( most recent) Lipid Panel     Component Value Date/Time   CHOL 232 (H) 10/03/2018 1107   TRIG 221 (H) 10/03/2018 1107   HDL 53 10/03/2018 1107   CHOLHDL 4.4 10/03/2018 1107   VLDL 37 06/24/2017 1239   LDLCALC 143 (H) 10/03/2018 1107      Lab Results  Component Value Date   TSH 1.80 01/04/2018   TSH 2.75 05/12/2016   TSH 1.915 07/05/2013   TSH 0.829 06/25/2011   TSH 1.159 09/28/2010   TSH 1.824 03/26/2009       Assessment & Plan:   1. Type 2 diabetes mellitus with stage 2 chronic kidney disease, with long-term current use of insulin (HCC)  - Rupa S Arakawa has currently uncontrolled symptomatic type 2 DM since  58 years of age,  with most recent A1c of 10.5 %. Recent labs reviewed. - I had a long discussion with her about the progressive nature of diabetes and the pathology behind its complications. -her diabetes is complicated by renal insufficiency, obesity/sedentary life and she remains at a high risk for more acute and chronic complications which include CAD, CVA, CKD, retinopathy, and neuropathy. These are all discussed in detail with her.  - I have counseled her on diet management and weight loss, by adopting a carbohydrate restricted/protein rich diet.  -  Suggestion is made for her to avoid simple carbohydrates  from her diet including Cakes, Sweet Desserts / Pastries, Ice Cream, Soda (diet and regular), Sweet Tea, Candies, Chips, Cookies, Store Bought Juices, Alcohol in Excess of  1-2 drinks a day, Artificial Sweeteners, and "Sugar-free" Products. This will help patient to have stable blood glucose profile and potentially avoid unintended weight  gain.   - I encouraged her to switch to  unprocessed or minimally processed complex starch and increased protein intake (animal or plant source), fruits, and vegetables.  - she is advised to stick to a routine mealtimes to eat 3 meals  a day and avoid unnecessary snacks ( to snack only to correct hypoglycemia).   - I have approached her with the following individualized plan to manage diabetes and patient agrees:   -Based on her presentation with improved glycemic profile, she would not require prandial insulin for now.  -She is advised to increase her Lantus to 25 units nightly,  associated with strict monitoring of glucose 2 times a day-before breakfast and at bedtime.  - she is warned not to take insulin without proper monitoring per orders.  - she is encouraged to call clinic for blood glucose levels less than 70 or above 300 mg /dl. - she is advised to continue glipizide 10 mg XL daily at breakfast, therapeutically suitable for patient .  - she is not a candidate for metformin, SGLT2 inhibitors due to concurrent renal insufficiency.  -She has several comorbidities including cervical cancer, history of significant smoking with COPD/bronchial asthma, not suitable candidate for incretin therapy.  - Patient specific target  A1c;  LDL, HDL, Triglycerides, and  Waist Circumference were discussed in detail.  2) Blood Pressure /Hypertension:  her blood pressure is uncontrolled to target.   she is advised to continue her current medications including lisinopril 10 mg p.o. daily with breakfast . 3) Lipids/Hyperlipidemia:   Review   of her recent lipid panel showed uncontrolled  LDL at 143.  she  is advised to continue    Zetia 10 mg daily at bedtime.  Side effects and precautions discussed with her.  Will be considered for statin therapy if no contraindications on next visit.  4)  Weight/Diet:  Body mass index is 42.16 kg/m.  - clearly complicating her diabetes care.  I discussed with her the  fact that loss of 5 - 10% of her  current body weight will have the most impact on her diabetes management.  CDE has been initiated . Exercise, and detailed carbohydrates information provided  -  detailed on discharge instructions.  5) Chronic Care/Health Maintenance:  -she  is on ACEI/ARB and  is encouraged to initiate and continue to follow up with Ophthalmology, Dentist,  Podiatrist at least yearly or according to recommendations, and advised to  stay away from smoking. I have recommended yearly flu vaccine and pneumonia vaccine at least every 5 years; moderate intensity exercise for up to 150 minutes weekly; and  sleep for at least 7 hours a day.  - she is  advised to maintain close follow up with Simpson, Margaret E, MD for primary care needs, as well as her other providers for optimal and coordinated care.  - Time spent with the patient: 25 min, of which >50% was spent in reviewing her blood glucose logs , discussing her hypo- and hyper-glycemic episodes, reviewing her current and  previous labs and insulin doses and developing a plan to avoid hypo- and hyper-glycemia. Please refer to Patient Instructions for Blood Glucose Monitoring and Insulin/Medications Dosing Guide"  in media tab for additional information. Johnisha S Divito participated in the discussions, expressed understanding, and voiced agreement with the above plans.  All questions were answered to her satisfaction. she is encouraged to contact clinic should she have any questions or concerns prior to her return visit.  Follow up plan: - Return in about 9 weeks (around 02/05/2019) for Meter, and Logs, Follow up with Pre-visit Labs, Meter, and Logs.  Gebre , MD [facility] [address] [place], [place] [address] Phone: [phone]  Fax: [phone]    12/04/2018, 1:16 PM  This note was partially dictated with voice recognition software. Similar sounding words can be  transcribed inadequately or may not  be corrected upon review.  

## 2018-12-04 NOTE — Telephone Encounter (Signed)
Pt called wanting to know if she should still be taking the Vitamin D as she doesn't have anymore of it.

## 2018-12-04 NOTE — Telephone Encounter (Signed)
Would you like for her to continue this?

## 2018-12-04 NOTE — Progress Notes (Signed)
Medical Nutrition Therapy:  Appt start time: 1100 end time:  1200.   Assessment:  Primary concerns today: Diabetes. Lives with a friend..  Eating three time per day. Testing blood sugars 4 times per day per DR. Nida.  Walks with a cane. Has 3 bulging discs. Has 1 tumor down her back on spine. Has cervical cancer stg 3 when she was 18. Most family members have died of some form of cancer.  FBS   200'ss. LIver enzymes were noted to be very high. Lantus 25 units a day, increased from 20 units.  Continue Glimperide 10 mg at breakfast. Willing to work on eating better to improve her DM. Doesn't want to have to go on meal time insulin. Mobility is limited.  BS are better. Needs to cut out sodas and snacks and increase fresh fruits and vegetables.   Lab Results  Component Value Date   HGBA1C 10.5 (H) 10/03/2018   CMP Latest Ref Rng & Units 10/03/2018 04/25/2018 04/25/2018  Glucose 65 - 99 mg/dL 260(H) 127(H) -  BUN 7 - 25 mg/dL 10 15 -  Creatinine 0.50 - 1.05 mg/dL 1.08(H) 0.82 -  Sodium 135 - 146 mmol/L 138 140 -  Potassium 3.5 - 5.3 mmol/L 4.0 4.1 -  Chloride 98 - 110 mmol/L 95(L) 100 -  CO2 20 - 32 mmol/L 31 29 -  Calcium 8.6 - 10.4 mg/dL 10.0 9.6 -  Total Protein 6.1 - 8.1 g/dL 8.9(H) 8.2(H) 8.2(H)  Total Bilirubin 0.2 - 1.2 mg/dL 0.3 0.3 0.3  Alkaline Phos 38 - 126 U/L - - -  AST 10 - 35 U/L 232(H) 93(H) 93(H)  ALT 6 - 29 U/L 110(H) 80(H) 80(H)     Preferred Learning Style:  No preference indicated   Learning Readiness:   Change in progress   MEDICATIONS:   DIETARY INTAKE:    24-hr recall:  B ( AM): Pb sandwich,  Water  Snk ( AM):   L ( PM): boiled fish, sweet potato 1/2 and toss salad ,  water Snk ( PM): 10 grapes D ( PM):  Chicken, rice,  1/2 gingerale Snk ( PM):  Beverages: water, soda   Usual physical activity: ADL   Estimated energy needs: 1200  calories 135 g carbohydrates 90 g protein 33 g fat  Progress Towards Goal(s):  In progress.   Nutritional  Diagnosis:  NB-1.1 Food and nutrition-related knowledge deficit As related to Diabetes Type 2.  As evidenced by A1C 10.5%.    Intervention:  Nutrition and Diabetes education provided on My Plate, CHO counting, meal planning, portion sizes, timing of meals, avoiding snacks between meals unless having a low blood sugar, target ranges for A1C and blood sugars, signs/symptoms and treatment of hyper/hypoglycemia, monitoring blood sugars, taking medications as prescribed, benefits of exercising 30 minutes per day and prevention of complications of DM. Marland Kitchen Goals  Follow My Plate  Eat three meals per day  Increase fresh fruits and vegetables  Cut out sodas and sweets and only drink water  Take 25 units of Lantus daily and 10 mg of Glimperide daily. Test BS in am. Goal is less than 150 mg/dl. Get A1C to 7% or less.   Teaching Method Utilized:  Visual Auditory Hands on  Handouts given during visit include:  The Plate Method   Meal Plan Card   Barriers to learning/adherence to lifestyle change: cancer  Demonstrated degree of understanding via:  Teach Back   Monitoring/Evaluation:  Dietary intake, exercise,  and body weight  in 1 month(s).

## 2018-12-04 NOTE — Patient Instructions (Signed)

## 2018-12-06 ENCOUNTER — Other Ambulatory Visit: Payer: Self-pay | Admitting: Family Medicine

## 2018-12-06 ENCOUNTER — Telehealth: Payer: Self-pay

## 2018-12-06 DIAGNOSIS — E559 Vitamin D deficiency, unspecified: Secondary | ICD-10-CM

## 2018-12-06 MED ORDER — ALPRAZOLAM 1 MG PO TABS: 1.0000 mg | ORAL_TABLET | Freq: Two times a day (BID) | ORAL | 2 refills | Status: AC | PRN

## 2018-12-06 NOTE — Telephone Encounter (Signed)
Lab ordered. Message left notifying patient and requesting she call the office back.

## 2018-12-06 NOTE — Telephone Encounter (Signed)
Needs updated Vit D level so I can determine, please order

## 2018-12-06 NOTE — Telephone Encounter (Signed)
Vitamin D lab ordered and patient notified.

## 2018-12-14 NOTE — Progress Notes (Signed)
Opened in error

## 2018-12-14 NOTE — Progress Notes (Signed)
Pt is aware of results and plan and to keep March appt.

## 2018-12-21 ENCOUNTER — Encounter: Payer: Self-pay | Admitting: Nutrition

## 2018-12-21 NOTE — Patient Instructions (Signed)
Goals  Follow My Plate  Eat three meals per day  Increase fresh fruits and vegetables  Cut out sodas and sweets and only drink water  Take 25 units of Lantus daily and 10 mg of Glimperide daily. Test BS in am. Goal is less than 150 mg/dl. Get A1C to 7% or less.

## 2019-01-02 DIAGNOSIS — R2689 Other abnormalities of gait and mobility: Secondary | ICD-10-CM | POA: Diagnosis not present

## 2019-01-02 DIAGNOSIS — G43711 Chronic migraine without aura, intractable, with status migrainosus: Secondary | ICD-10-CM | POA: Diagnosis not present

## 2019-01-02 DIAGNOSIS — G5603 Carpal tunnel syndrome, bilateral upper limbs: Secondary | ICD-10-CM | POA: Diagnosis not present

## 2019-01-02 DIAGNOSIS — M545 Low back pain: Secondary | ICD-10-CM | POA: Diagnosis not present

## 2019-01-30 ENCOUNTER — Encounter: Payer: Self-pay | Admitting: *Deleted

## 2019-01-31 ENCOUNTER — Ambulatory Visit: Payer: Medicare Other | Admitting: Gastroenterology

## 2019-02-01 ENCOUNTER — Ambulatory Visit: Payer: Medicare Other | Admitting: Family Medicine

## 2019-02-05 ENCOUNTER — Ambulatory Visit: Payer: Medicare Other | Admitting: "Endocrinology

## 2019-02-05 ENCOUNTER — Ambulatory Visit: Payer: Medicare Other | Admitting: Nutrition

## 2019-02-12 ENCOUNTER — Other Ambulatory Visit: Payer: Self-pay | Admitting: Family Medicine

## 2019-02-22 ENCOUNTER — Ambulatory Visit: Payer: Medicare Other | Admitting: Family Medicine

## 2019-02-27 DIAGNOSIS — R2689 Other abnormalities of gait and mobility: Secondary | ICD-10-CM | POA: Diagnosis not present

## 2019-02-27 DIAGNOSIS — G5603 Carpal tunnel syndrome, bilateral upper limbs: Secondary | ICD-10-CM | POA: Diagnosis not present

## 2019-02-27 DIAGNOSIS — G43711 Chronic migraine without aura, intractable, with status migrainosus: Secondary | ICD-10-CM | POA: Diagnosis not present

## 2019-02-27 DIAGNOSIS — M545 Low back pain: Secondary | ICD-10-CM | POA: Diagnosis not present

## 2019-03-05 ENCOUNTER — Other Ambulatory Visit: Payer: Self-pay | Admitting: Family Medicine

## 2019-03-06 NOTE — Telephone Encounter (Signed)
Please refill electronically

## 2019-03-07 ENCOUNTER — Other Ambulatory Visit: Payer: Self-pay | Admitting: Family Medicine

## 2019-03-07 MED ORDER — ALPRAZOLAM 1 MG PO TABS: 1.0000 mg | ORAL_TABLET | Freq: Two times a day (BID) | ORAL | 0 refills | Status: DC

## 2019-03-07 NOTE — Telephone Encounter (Signed)
pls refill 

## 2019-03-21 ENCOUNTER — Ambulatory Visit (INDEPENDENT_AMBULATORY_CARE_PROVIDER_SITE_OTHER): Payer: Medicare Other | Admitting: Family Medicine

## 2019-03-21 ENCOUNTER — Encounter: Payer: Self-pay | Admitting: Family Medicine

## 2019-03-21 ENCOUNTER — Other Ambulatory Visit: Payer: Self-pay

## 2019-03-21 VITALS — BP 140/88 | Ht 63.0 in | Wt 238.0 lb

## 2019-03-21 DIAGNOSIS — Z794 Long term (current) use of insulin: Secondary | ICD-10-CM

## 2019-03-21 DIAGNOSIS — E782 Mixed hyperlipidemia: Secondary | ICD-10-CM | POA: Diagnosis not present

## 2019-03-21 DIAGNOSIS — I10 Essential (primary) hypertension: Secondary | ICD-10-CM

## 2019-03-21 DIAGNOSIS — N182 Chronic kidney disease, stage 2 (mild): Secondary | ICD-10-CM | POA: Diagnosis not present

## 2019-03-21 DIAGNOSIS — E1122 Type 2 diabetes mellitus with diabetic chronic kidney disease: Secondary | ICD-10-CM

## 2019-03-21 DIAGNOSIS — Z1231 Encounter for screening mammogram for malignant neoplasm of breast: Secondary | ICD-10-CM

## 2019-03-21 DIAGNOSIS — F4323 Adjustment disorder with mixed anxiety and depressed mood: Secondary | ICD-10-CM | POA: Diagnosis not present

## 2019-03-21 MED ORDER — ALPRAZOLAM 1 MG PO TABS: 1.0000 mg | ORAL_TABLET | Freq: Two times a day (BID) | ORAL | 3 refills | Status: DC

## 2019-03-21 NOTE — Progress Notes (Signed)
Virtual Visit via Telephone Note  I connected with Leslie Lyons on 03/21/19 at  2:00 PM EDT by telephone and verified that I am speaking with the correct person using two identifiers.   I discussed the limitations, risks, security and privacy concerns of performing an evaluation and management service by telephone and the availability of in person appointments. I also discussed with the patient that there may be a patient responsible charge related to this service. The patient expressed understanding and agreed to proceed. Pt is in her home and I am in the office , no visual contact is possible , though desired    History of Present Illness: C/o increased  Anxieety, son was missing and her Mother in law recently died , unexpectedly. Not suicidal or homicidal , ut anxious and depressed   Observations/Objective:  BP 140/88   Ht 5' 3"  (1.6 m)   Wt 238 lb (108 kg)   BMI 42.16 kg/m   Assessment and Plan: Essential hypertension, benign Uncontrolled and not at goal, needs OV DASH diet and commitment to daily physical activity for a minimum of 30 minutes discussed and encouraged, as a part of hypertension management. The importance of attaining a healthy weight is also discussed.  BP/Weight 03/21/2019 12/04/2018 12/04/2018 11/28/2018 10/31/2018 10/03/2018 06/13/9677  Systolic BP 938 - - 101 751 025 852  Diastolic BP 88 - - 87 86 94 88  Wt. (Lbs) 238 238 238 234 239.4 235 228  BMI 42.16 42.16 42.16 41.45 42.41 41.63 40.39        Morbid obesity (HCC) Deteriorated.  Patient re-educated about  the importance of commitment to a  minimum of 150 minutes of exercise per week as able.  The importance of healthy food choices with portion control discussed, as well as eating regularly and within a 12 hour window most days. The need to choose "clean , green" food 50 to 75% of the time is discussed, as well as to make water the primary drink and set a goal of 64 ounces water daily.  Encouraged to  start a food diary,  and to consider  joining a support group. Sample diet sheets offered. Goals set by the patient for the next several months.   Weight /BMI 03/21/2019 12/04/2018 12/04/2018  WEIGHT 238 lb 238 lb 238 lb  HEIGHT 5' 3"  5' 3"  5' 3"   BMI 42.16 kg/m2 42.16 kg/m2 42.16 kg/m2      Type 2 diabetes mellitus with stage 2 chronic kidney disease, with long-term current use of insulin (HCC) Uncontrolled, maanaged y Endo Updated lab needed Leslie Lyons is reminded of the importance of commitment to daily physical activity for 30 minutes or more, as able and the need to limit carbohydrate intake to 30 to 60 grams per meal to help with blood sugar control.   The need to take medication as prescribed, test blood sugar as directed, and to call between visits if there is a concern that blood sugar is uncontrolled is also discussed.   Leslie Lyons is reminded of the importance of daily foot exam, annual eye examination, and good blood sugar, blood pressure and cholesterol control.  Diabetic Labs Latest Ref Rng & Units 10/03/2018 04/25/2018 01/06/2018 01/04/2018 06/24/2017  HbA1c <5.7 % of total Hgb 10.5(H) 6.5(H) - 8.6(H) 8.7(H)  Microalbumin mg/dL - - 1.9 - 52.7(H)  Micro/Creat Ratio <30 mcg/mg creat - - 20 - -  Chol <200 mg/dL 232(H) - - 293(H) 261(H)  HDL >50 mg/dL 53 - - 54  55  Calc LDL mg/dL (calc) 143(H) - - 192(H) 169(H)  Triglycerides <150 mg/dL 221(H) - - 263(H) 187(H)  Creatinine 0.50 - 1.05 mg/dL 1.08(H) 0.82 - 0.87 0.86   BP/Weight 03/21/2019 12/04/2018 12/04/2018 11/28/2018 10/31/2018 10/03/2018 1/36/8599  Systolic BP 234 - - 144 360 165 800  Diastolic BP 88 - - 87 86 94 88  Wt. (Lbs) 238 238 238 234 239.4 235 228  BMI 42.16 42.16 42.16 41.45 42.41 41.63 40.39   Foot/eye exam completion dates 01/04/2018 06/22/2017  Foot Form Completion Done Done        Adjustment disorder with mixed anxiety and depressed mood Increased anxiety due to recent stresses , no change in chronic  medication, would benefit from therapy but no intereest    Follow Up Instructions:    I discussed the assessment and treatment plan with the patient. The patient was provided an opportunity to ask questions and all were answered. The patient agreed with the plan and demonstrated an understanding of the instructions.   The patient was advised to call back or seek an in-person evaluation if the symptoms worsen or if the condition fails to improve as anticipated.  I provided 25  minutes of non-face-to-face time during this encounter.   Tula Nakayama, MD

## 2019-03-21 NOTE — Patient Instructions (Addendum)
Keep welcome TO MEDICARE VISIT  Need FASTING LIPID, CMP AND egfR, tsh AND MICROALB  PLS GPO TO [facility] ( NEXT DOOR) NEXT WEEK , THE ORDER IS FAXED OVER, GO EARLY IN TH EMORNING BEWEEN 7 AND 9 AM  MAMMOGRAM TO BE SCHEDULED FOR END AUGUST OR SHORTLY AFTER]   CONDOLENCE AND PRAYERS FOR YOU AND YOUR HUSBAND  Social distancing. Frequent hand washing with soap and water Keeping your hands off of your face. These 3 practices will help to keep both you and your community healthy during this time. Please practice them faithfully!

## 2019-03-24 NOTE — Assessment & Plan Note (Signed)
Deteriorated.  Patient re-educated about  the importance of commitment to a  minimum of 150 minutes of exercise per week as able.  The importance of healthy food choices with portion control discussed, as well as eating regularly and within a 12 hour window most days. The need to choose "clean , green" food 50 to 75% of the time is discussed, as well as to make water the primary drink and set a goal of 64 ounces water daily.  Encouraged to start a food diary,  and to consider  joining a support group. Sample diet sheets offered. Goals set by the patient for the next several months.   Weight /BMI 03/21/2019 12/04/2018 12/04/2018  WEIGHT 238 lb 238 lb 238 lb  HEIGHT 5' 3"  5' 3"  5' 3"   BMI 42.16 kg/m2 42.16 kg/m2 42.16 kg/m2

## 2019-03-24 NOTE — Assessment & Plan Note (Signed)
Uncontrolled and not at goal, needs OV DASH diet and commitment to daily physical activity for a minimum of 30 minutes discussed and encouraged, as a part of hypertension management. The importance of attaining a healthy weight is also discussed.  BP/Weight 03/21/2019 12/04/2018 12/04/2018 11/28/2018 10/31/2018 10/03/2018 1/89/8421  Systolic BP 031 - - 281 188 677 373  Diastolic BP 88 - - 87 86 94 88  Wt. (Lbs) 238 238 238 234 239.4 235 228  BMI 42.16 42.16 42.16 41.45 42.41 41.63 40.39

## 2019-03-24 NOTE — Assessment & Plan Note (Signed)
Increased anxiety due to recent stresses , no change in chronic medication, would benefit from therapy but no intereest

## 2019-03-24 NOTE — Assessment & Plan Note (Signed)
Uncontrolled, maanaged y Endo Updated lab needed Leslie Lyons is reminded of the importance of commitment to daily physical activity for 30 minutes or more, as able and the need to limit carbohydrate intake to 30 to 60 grams per meal to help with blood sugar control.   The need to take medication as prescribed, test blood sugar as directed, and to call between visits if there is a concern that blood sugar is uncontrolled is also discussed.   Leslie Lyons is reminded of the importance of daily foot exam, annual eye examination, and good blood sugar, blood pressure and cholesterol control.  Diabetic Labs Latest Ref Rng & Units 10/03/2018 04/25/2018 01/06/2018 01/04/2018 06/24/2017  HbA1c <5.7 % of total Hgb 10.5(H) 6.5(H) - 8.6(H) 8.7(H)  Microalbumin mg/dL - - 1.9 - 52.7(H)  Micro/Creat Ratio <30 mcg/mg creat - - 20 - -  Chol <200 mg/dL 232(H) - - 293(H) 261(H)  HDL >50 mg/dL 53 - - 54 55  Calc LDL mg/dL (calc) 143(H) - - 192(H) 169(H)  Triglycerides <150 mg/dL 221(H) - - 263(H) 187(H)  Creatinine 0.50 - 1.05 mg/dL 1.08(H) 0.82 - 0.87 0.86   BP/Weight 03/21/2019 12/04/2018 12/04/2018 11/28/2018 10/31/2018 10/03/2018 04/25/7823  Systolic BP 235 - - 361 443 154 008  Diastolic BP 88 - - 87 86 94 88  Wt. (Lbs) 238 238 238 234 239.4 235 228  BMI 42.16 42.16 42.16 41.45 42.41 41.63 40.39   Foot/eye exam completion dates 01/04/2018 06/22/2017  Foot Form Completion Done Done

## 2019-03-27 DIAGNOSIS — E1122 Type 2 diabetes mellitus with diabetic chronic kidney disease: Secondary | ICD-10-CM | POA: Diagnosis not present

## 2019-03-27 DIAGNOSIS — I10 Essential (primary) hypertension: Secondary | ICD-10-CM | POA: Diagnosis not present

## 2019-03-27 DIAGNOSIS — E782 Mixed hyperlipidemia: Secondary | ICD-10-CM | POA: Diagnosis not present

## 2019-03-27 DIAGNOSIS — N182 Chronic kidney disease, stage 2 (mild): Secondary | ICD-10-CM | POA: Diagnosis not present

## 2019-03-27 DIAGNOSIS — Z794 Long term (current) use of insulin: Secondary | ICD-10-CM | POA: Diagnosis not present

## 2019-03-28 LAB — COMPLETE METABOLIC PANEL WITH GFR
AG Ratio: 1.3 (calc) (ref 1.0–2.5)
ALT: 33 U/L — ABNORMAL HIGH (ref 6–29)
AST: 64 U/L — ABNORMAL HIGH (ref 10–35)
Albumin: 4.8 g/dL (ref 3.6–5.1)
Alkaline phosphatase (APISO): 81 U/L (ref 37–153)
BUN: 8 mg/dL (ref 7–25)
CO2: 29 mmol/L (ref 20–32)
Calcium: 10.1 mg/dL (ref 8.6–10.4)
Chloride: 101 mmol/L (ref 98–110)
Creat: 0.87 mg/dL (ref 0.50–1.05)
GFR, Est African American: 85 mL/min/{1.73_m2} (ref >=60)
GFR, Est Non African American: 73 mL/min/{1.73_m2} (ref >=60)
Globulin: 3.6 g/dL (calc) (ref 1.9–3.7)
Glucose, Bld: 145 mg/dL — ABNORMAL HIGH (ref 65–99)
Potassium: 4.1 mmol/L (ref 3.5–5.3)
Sodium: 141 mmol/L (ref 135–146)
Total Bilirubin: 0.4 mg/dL (ref 0.2–1.2)
Total Protein: 8.4 g/dL — ABNORMAL HIGH (ref 6.1–8.1)

## 2019-03-28 LAB — LIPID PANEL
Cholesterol: 260 mg/dL — ABNORMAL HIGH (ref <200)
HDL: 59 mg/dL (ref >=50)
LDL Cholesterol (Calc): 164 mg/dL (calc) — ABNORMAL HIGH
Non-HDL Cholesterol (Calc): 201 mg/dL (calc) — ABNORMAL HIGH (ref <130)
Total CHOL/HDL Ratio: 4.4 (calc) (ref <5.0)
Triglycerides: 201 mg/dL — ABNORMAL HIGH (ref <150)

## 2019-03-28 LAB — MICROALBUMIN, URINE: Microalb, Ur: 8.6 mg/dL

## 2019-03-28 LAB — TSH: TSH: 3.56 mIU/L (ref 0.40–4.50)

## 2019-04-03 ENCOUNTER — Ambulatory Visit: Payer: Medicare Other | Admitting: Family Medicine

## 2019-04-06 ENCOUNTER — Ambulatory Visit (INDEPENDENT_AMBULATORY_CARE_PROVIDER_SITE_OTHER): Payer: Medicare Other | Admitting: Family Medicine

## 2019-04-06 ENCOUNTER — Encounter: Payer: Self-pay | Admitting: Family Medicine

## 2019-04-06 ENCOUNTER — Other Ambulatory Visit: Payer: Self-pay

## 2019-04-06 VITALS — BP 132/88 | HR 102 | Temp 99.2°F | Resp 14 | Ht 63.0 in | Wt 204.0 lb

## 2019-04-06 DIAGNOSIS — Z0000 Encounter for general adult medical examination without abnormal findings: Secondary | ICD-10-CM

## 2019-04-06 NOTE — Progress Notes (Signed)
Subjective:    Leslie Lyons is a 58 y.o. female who presents for a Welcome to Medicare exam.   Review of Systems   Cardiac Risk Factors include: obesity (BMI >30kg/m2);diabetes mellitus;hypertension  Review of Systems  Constitutional: Negative for activity change, appetite change, chills and fever.  HENT: Negative.   Eyes: Negative.   Respiratory: Negative for cough, chest tightness, shortness of breath and wheezing.   Cardiovascular: Negative for chest pain, palpitations and leg swelling.  Gastrointestinal: Negative.   Endocrine: Negative.   Genitourinary: Negative.        Unable to hold urine-wears depends  Musculoskeletal: Negative.   Skin: Negative.   Allergic/Immunologic: Negative.   Neurological: Negative.   Hematological: Negative.   Psychiatric/Behavioral: Negative.   All other systems reviewed and are negative.        Objective:    Today's Vitals   04/06/19 0836 04/06/19 0842  BP: 132/88   Pulse: (!) 102   Resp: 14   Temp: 99.2 F (37.3 C)   TempSrc: Oral   SpO2: 96%   Weight: 204 lb (92.5 kg)   Height: _0  (1.6 m)   PainSc:  10-Worst pain ever  Body mass index is 36.14 kg/m.  Medications Outpatient Encounter Medications as of 04/06/2019  Medication Sig  . ACCU-CHEK FASTCLIX LANCETS MISC Twice daily testing dx e11.9  . albuterol (PROAIR HFA) 108 (90 Base) MCG/ACT inhaler Inhale 2 puffs into the lungs every 6 (six) hours as needed.  . ALPRAZolam (XANAX) 1 MG tablet Take 1 tablet (1 mg total) by mouth 2 (two) times daily.  . blood glucose meter kit and supplies Dispense based on patient and insurance preference. Once daily testing dx e11.9  . Cholecalciferol (VITAMIN D3) 125 MCG (5000 UT) CAPS Take 1 capsule (5,000 Units total) by mouth daily.  . DULoxetine (CYMBALTA) 30 MG capsule Take 30 mg by mouth daily.  Marland Kitchen ezetimibe (ZETIA) 10 MG tablet Take 1 tablet (10 mg total) by mouth daily. (Patient taking differently: Take 10 mg by mouth daily. )  .  glipiZIDE (GLUCOTROL XL) 10 MG 24 hr tablet Take 1 tablet (10 mg total) by mouth daily with breakfast.  . glucose blood (ACCU-CHEK GUIDE) test strip Use as instructed twice daily dx e11.9  . Insulin Glargine (LANTUS SOLOSTAR) 100 UNIT/ML Solostar Pen Inject 25 Units into the skin at bedtime.  Marland Kitchen lisinopril (PRINIVIL,ZESTRIL) 10 MG tablet Take 1 tablet by mouth once daily  . mometasone (NASONEX) 50 MCG/ACT nasal spray Place 2 sprays into the nose daily.  . mometasone-formoterol (DULERA) 200-5 MCG/ACT AERO Inhale 2 puffs into the lungs 2 (two) times daily.  . rizatriptan (MAXALT) 10 MG tablet Take 10 mg by mouth as needed for migraine. May repeat in 2 hours if needed  . tolterodine (DETROL LA) 2 MG 24 hr capsule Take 1 capsule (2 mg total) by mouth daily. (Patient taking differently: Take 2 mg by mouth daily. urination)  . venlafaxine XR (EFFEXOR-XR) 37.5 MG 24 hr capsule Take 1 capsule (37.5 mg total) by mouth daily with breakfast. (Patient taking differently: Take 37.5 mg by mouth daily with breakfast. )   No facility-administered encounter medications on file as of 04/06/2019.      History: Past Medical History:  Diagnosis Date  . Abnormal transaminases   . Anxiety   . Bronchial asthma    with acute exacerbation  . Cancer ([place]) 1980   cervical, diag  at age 89  . Depression   . Diabetes (  [place])   . Headache(784.0)    with reduced vision in right eye for one month   . Hyperlipidemia   . Hypertension   . Obesity    Past Surgical History:  Procedure Laterality Date  . ABDOMINAL HYSTERECTOMY    . COLONOSCOPY WITH ESOPHAGOGASTRODUODENOSCOPY (EGD)  11/2008   Dr. Arnoldo Morale: Conscious sedation.  Hiatal hernia.  Hemorrhoids. PATIENT REPORTS BEING AWAKE DURING PROCEDURE  . CRYOTHERAPY     for abnormal pap  . HEMORRHOID SURGERY     dr. Irving Shows  . PARTIAL HYSTERECTOMY      Family History  Problem Relation Age of Onset  . Cancer Mother        lung   . COPD Father        emphysema  .  Hypertension Father   . Cancer Maternal Aunt        breast  . Cancer Paternal Uncle   . Colon cancer Neg Hx    Social History   Occupational History  . Occupation: homemaker   Tobacco Use  . Smoking status: Former Smoker    Packs/day: 0.25    Years: 2.00    Pack years: 0.50    Types: Cigarettes    Last attempt to quit: 12/04/2015    Years since quitting: 3.3  . Smokeless tobacco: Never Used  Substance and Sexual Activity  . Alcohol use: No  . Drug use: No  . Sexual activity: Not Currently    Tobacco Counseling Counseling given: Yes   Immunizations and Health Maintenance Immunization History  Administered Date(s) Administered  . Influenza Whole 09/25/2007, 11/04/2009, 09/22/2010  . Influenza,inj,Quad PF,6+ Mos 08/09/2013, 07/22/2014, 11/17/2015, 01/04/2018, 10/03/2018  . Pneumococcal Conjugate-13 04/25/2018  . Pneumococcal Polysaccharide-23 07/31/2009, 07/05/2016  . Td 03/26/2009   Health Maintenance Due  Topic Date Due  . OPHTHALMOLOGY EXAM  01/18/1971  . COLONOSCOPY  12/17/2018  . FOOT EXAM  01/06/2019  . TETANUS/TDAP  03/27/2019  . PAP SMEAR-Modifier  03/31/2019  . HEMOGLOBIN A1C  04/03/2019    Activities of Daily Living In your present state of health, do you have any difficulty performing the following activities: 04/06/2019  Hearing? N  Vision? N  Difficulty concentrating or making decisions? Y  Walking or climbing stairs? Y  Dressing or bathing? Y  Doing errands, shopping? Y  Preparing Food and eating ? Y  Using the Toilet? Y  In the past six months, have you accidently leaked urine? N  Do you have problems with loss of bowel control? N  Managing your Medications? Y  Managing your Finances? Y  Housekeeping or managing your Housekeeping? Y  Some recent data might be hidden    Physical Exam   (optional), or other factors deemed appropriate based on the beneficiary's medical and social history and current clinical standards.  Physical Exam Vitals  signs and nursing note reviewed.  Constitutional:      Appearance: Normal appearance. She is obese.  HENT:     Head: Normocephalic and atraumatic.     Right Ear: External ear normal.     Left Ear: External ear normal.     Nose: Nose normal.  Eyes:     Conjunctiva/sclera: Conjunctivae normal.  Neck:     Musculoskeletal: Normal range of motion and neck supple.  Cardiovascular:     Rate and Rhythm: Normal rate and regular rhythm.     Pulses: Normal pulses.          Radial pulses are 2+ on the right side  and 2+ on the left side.       Dorsalis pedis pulses are 2+ on the right side and 2+ on the left side.     Heart sounds: Normal heart sounds.  Pulmonary:     Effort: Pulmonary effort is normal.     Breath sounds: Normal breath sounds.  Abdominal:     General: Abdomen is flat.     Palpations: Abdomen is soft.  Musculoskeletal:     Right shoulder: She exhibits decreased range of motion.     Left shoulder: She exhibits decreased range of motion.     Right knee: She exhibits decreased range of motion.     Left knee: She exhibits decreased range of motion.     Lumbar back: She exhibits decreased range of motion.     Right lower leg: No edema.     Left lower leg: No edema.  Skin:    General: Skin is warm and dry.     Capillary Refill: Capillary refill takes less than 2 seconds.  Neurological:     Mental Status: She is alert and oriented to person, place, and time.     Cranial Nerves: Cranial nerves are intact.     Sensory: Sensation is intact.     Motor: Motor function is intact.     Gait: Gait abnormal.     Comments: Constant sway while sitting.   Psychiatric:        Attention and Perception: Attention normal.        Mood and Affect: Mood normal.        Behavior: Behavior normal.        Thought Content: Thought content normal.        Judgment: Judgment normal.    Advanced Directives: discussed in detailed, no legal paperwork at this time.      Assessment:    This is a  routine wellness examination for this patient .    Vision/Hearing screen No exam data present   Needs to get vision checked Hearing is checked every birthday until 2018  Dietary issues and exercise activities discussed:  Current Exercise Habits: The patient does not participate in regular exercise at present, Exercise limited by: orthopedic condition(s)  Goals   None    Depression Screen PHQ 2/9 Scores 04/06/2019 10/03/2018 05/08/2018 04/25/2018  PHQ - 2 Score 0 _0 PHQ- 9 Score - _1 Fall Risk Fall Risk  04/06/2019  Falls in the past year? 1  Number falls in past yr: 1  Injury with Fall? 1  Risk Factor Category  -  Risk for fall due to : -  Follow up -    Cognitive Function:     6CIT Screen 04/06/2019  What Year? 0 points  What month? 0 points  What time? 0 points  Count back from 20 0 points  Months in reverse 0 points  Repeat phrase 2 points  Total Score 2    Patient Care Team: [name], MD as PCP - General (Family Medicine) Madelin Headings, DO (Optometry) Danie Binder, MD as Consulting Physician (Gastroenterology)     Plan:       1. Encounter for Medicare annual wellness exam  I have personally reviewed and noted the following in the patient's chart:   . Medical and social history . Use of alcohol, tobacco or illicit drugs  . Current medications and supplements . Functional ability and status . Nutritional status .  Physical activity . Advanced directives . List of other physicians . Hospitalizations, surgeries, and ER visits in previous 12 months . Vitals . Screenings to include cognitive, depression, and falls . Referrals and appointments  In addition, I have reviewed and discussed with patient certain preventive protocols, quality metrics, and best practice recommendations. A written personalized care plan for preventive services as well as general preventive health recommendations were provided to patient.        Perlie Mayo, NP 04/06/2019

## 2019-04-06 NOTE — Patient Instructions (Addendum)
Leslie Lyons , Thank you for taking time to come for your Medicare Wellness Visit. I appreciate your ongoing commitment to your health goals. Please review the following plan we discussed and let me know if I can assist you in the future.   Screening recommendations/referrals: Colonoscopy: Due this year Mammogram: Due this year Bone Density: Due at age 58 Recommended yearly ophthalmology/optometry visit for glaucoma screening and checkup Recommended yearly dental visit for hygiene and checkup  Vaccinations: Influenza vaccine: Due Fall 2020 Pneumococcal vaccine: Completed  Tdap vaccine: Completed in 2010 (due every 10 years) Shingles vaccine: Due at 65-Discuss with Dr Moshe Cipro  Advanced directives: Discussed in office today-has not legal paperwork at this time  Conditions/risks identified: FALLS  Next appointment: 3 months with Dr Moshe Cipro and 1 year for next Annual Wellness Exam  Preventive Care 40-64 Years, Female Preventive care refers to lifestyle choices and visits with your health care provider that can promote health and wellness. What does preventive care include?  A yearly physical exam. This is also called an annual well check.  Dental exams once or twice a year.  Routine eye exams. Ask your health care provider how often you should have your eyes checked.  Personal lifestyle choices, including:  Daily care of your teeth and gums.  Regular physical activity.  Eating a healthy diet.  Avoiding tobacco and drug use.  Limiting alcohol use.  Practicing safe sex.  Taking low-dose aspirin daily starting at age 46.  Taking vitamin and mineral supplements as recommended by your health care provider. What happens during an annual well check? The services and screenings done by your health care provider during your annual well check will depend on your age, overall health, lifestyle risk factors, and family history of disease. Counseling  Your health care provider may  ask you questions about your:  Alcohol use.  Tobacco use.  Drug use.  Emotional well-being.  Home and relationship well-being.  Sexual activity.  Eating habits.  Work and work Statistician.  Method of birth control.  Menstrual cycle.  Pregnancy history. Screening  You may have the following tests or measurements:  Height, weight, and BMI.  Blood pressure.  Lipid and cholesterol levels. These may be checked every 5 years, or more frequently if you are over 39 years old.  Skin check.  Lung cancer screening. You may have this screening every year starting at age 55 if you have a 30-pack-year history of smoking and currently smoke or have quit within the past 15 years.  Fecal occult blood test (FOBT) of the stool. You may have this test every year starting at age 87.  Flexible sigmoidoscopy or colonoscopy. You may have a sigmoidoscopy every 5 years or a colonoscopy every 10 years starting at age 24.  Hepatitis C blood test.  Hepatitis B blood test.  Sexually transmitted disease (STD) testing.  Diabetes screening. This is done by checking your blood sugar (glucose) after you have not eaten for a while (fasting). You may have this done every 1-3 years.  Mammogram. This may be done every 1-2 years. Talk to your health care provider about when you should start having regular mammograms. This may depend on whether you have a family history of breast cancer.  BRCA-related cancer screening. This may be done if you have a family history of breast, ovarian, tubal, or peritoneal cancers.  Pelvic exam and Pap test. This may be done every 3 years starting at age 39. Starting at age 57, this may be  done every 5 years if you have a Pap test in combination with an HPV test.  Bone density scan. This is done to screen for osteoporosis. You may have this scan if you are at high risk for osteoporosis. Discuss your test results, treatment options, and if necessary, the need for more  tests with your health care provider. Vaccines  Your health care provider may recommend certain vaccines, such as:  Influenza vaccine. This is recommended every year.  Tetanus, diphtheria, and acellular pertussis (Tdap, Td) vaccine. You may need a Td booster every 10 years.  Zoster vaccine. You may need this after age 60.  Pneumococcal 13-valent conjugate (PCV13) vaccine. You may need this if you have certain conditions and were not previously vaccinated.  Pneumococcal polysaccharide (PPSV23) vaccine. You may need one or two doses if you smoke cigarettes or if you have certain conditions. Talk to your health care provider about which screenings and vaccines you need and how often you need them. This information is not intended to replace advice given to you by your health care provider. Make sure you discuss any questions you have with your health care provider. Document Released: 12/12/2015 Document Revised: 08/04/2016 Document Reviewed: 09/16/2015 Elsevier Interactive Patient Education  2017 Else[employer] Inc[employer]    Fall Prevention in the Home Falls can cause injuries. They can happen to people of all ages. There are many things you can do to make your home safe and to help prevent falls. What can I do on the outside of my home?  Regularly fix the edges of walkways and driveways and fix any cracks.  Remove anything that might make you trip as you walk through a door, such as a raised step or threshold.  Trim any bushes or trees on the path to your home.  Use bright outdoor lighting.  Clear any walking paths of anything that might make someone trip, such as rocks or tools.  Regularly check to see if handrails are loose or broken. Make sure that both sides of any steps have handrails.  Any raised decks and porches should have guardrails on the edges.  Have any leaves, snow, or ice cleared regularly.  Use sand or salt on walking paths during winter.  Clean up any spills in your  garage right away. This includes oil or grease spills. What can I do in the bathroom?  Use night lights.  Install grab bars by the toilet and in the tub and shower. Do not use towel bars as grab bars.  Use non-skid mats or decals in the tub or shower.  If you need to sit down in the shower, use a plastic, non-slip stool.  Keep the floor dry. Clean up any water that spills on the floor as soon as it happens.  Remove soap buildup in the tub or shower regularly.  Attach bath mats securely with double-sided non-slip rug tape.  Do not have throw rugs and other things on the floor that can make you trip. What can I do in the bedroom?  Use night lights.  Make sure that you have a light by your bed that is easy to reach.  Do not use any sheets or blankets that are too big for your bed. They should not hang down onto the floor.  Have a firm chair that has side arms. You can use this for support while you get dressed.  Do not have throw rugs and other things on the floor that can make you trip. What   can I do in the kitchen?  Clean up any spills right away.  Avoid walking on wet floors.  Keep items that you use a lot in easy-to-reach places.  If you need to reach something above you, use a strong step stool that has a grab bar.  Keep electrical cords out of the way.  Do not use floor polish or wax that makes floors slippery. If you must use wax, use non-skid floor wax.  Do not have throw rugs and other things on the floor that can make you trip. What can I do with my stairs?  Do not leave any items on the stairs.  Make sure that there are handrails on both sides of the stairs and use them. Fix handrails that are broken or loose. Make sure that handrails are as long as the stairways.  Check any carpeting to make sure that it is firmly attached to the stairs. Fix any carpet that is loose or worn.  Avoid having throw rugs at the top or bottom of the stairs. If you do have throw  rugs, attach them to the floor with carpet tape.  Make sure that you have a light switch at the top of the stairs and the bottom of the stairs. If you do not have them, ask someone to add them for you. What else can I do to help prevent falls?  Wear shoes that:  Do not have high heels.  Have rubber bottoms.  Are comfortable and fit you well.  Are closed at the toe. Do not wear sandals.  If you use a stepladder:  Make sure that it is fully opened. Do not climb a closed stepladder.  Make sure that both sides of the stepladder are locked into place.  Ask someone to hold it for you, if possible.  Clearly mark and make sure that you can see:  Any grab bars or handrails.  First and last steps.  Where the edge of each step is.  Use tools that help you move around (mobility aids) if they are needed. These include:  Canes.  Walkers.  Scooters.  Crutches.  Turn on the lights when you go into a dark area. Replace any light bulbs as soon as they burn out.  Set up your furniture so you have a clear path. Avoid moving your furniture around.  If any of your floors are uneven, fix them.  If there are any pets around you, be aware of where they are.  Review your medicines with your doctor. Some medicines can make you feel dizzy. This can increase your chance of falling. Ask your doctor what other things that you can do to help prevent falls. This information is not intended to replace advice given to you by your health care provider. Make sure you discuss any questions you have with your health care provider. Document Released: 09/11/2009 Document Revised: 04/22/2016 Document Reviewed: 12/20/2014 Elsevier Interactive Patient Education  2017 Elsevier Inc.   

## 2019-07-11 ENCOUNTER — Other Ambulatory Visit: Payer: Self-pay

## 2019-07-11 ENCOUNTER — Encounter: Payer: Self-pay | Admitting: Family Medicine

## 2019-07-11 ENCOUNTER — Ambulatory Visit (INDEPENDENT_AMBULATORY_CARE_PROVIDER_SITE_OTHER): Payer: Medicare Other | Admitting: Family Medicine

## 2019-07-11 VITALS — BP 140/82 | HR 121 | Temp 97.0°F | Resp 15 | Ht 63.0 in | Wt 226.0 lb

## 2019-07-11 DIAGNOSIS — E1122 Type 2 diabetes mellitus with diabetic chronic kidney disease: Secondary | ICD-10-CM | POA: Diagnosis not present

## 2019-07-11 DIAGNOSIS — N182 Chronic kidney disease, stage 2 (mild): Secondary | ICD-10-CM | POA: Diagnosis not present

## 2019-07-11 DIAGNOSIS — E782 Mixed hyperlipidemia: Secondary | ICD-10-CM

## 2019-07-11 DIAGNOSIS — J4540 Moderate persistent asthma, uncomplicated: Secondary | ICD-10-CM | POA: Diagnosis not present

## 2019-07-11 DIAGNOSIS — I10 Essential (primary) hypertension: Secondary | ICD-10-CM | POA: Diagnosis not present

## 2019-07-11 DIAGNOSIS — Z794 Long term (current) use of insulin: Secondary | ICD-10-CM

## 2019-07-11 DIAGNOSIS — F321 Major depressive disorder, single episode, moderate: Secondary | ICD-10-CM | POA: Diagnosis not present

## 2019-07-11 MED ORDER — VENLAFAXINE HCL ER 37.5 MG PO CP24: 37.5000 mg | ORAL_CAPSULE | Freq: Every day | ORAL | 5 refills | Status: DC

## 2019-07-11 MED ORDER — ALPRAZOLAM 1 MG PO TABS: 1.0000 mg | ORAL_TABLET | Freq: Two times a day (BID) | ORAL | 3 refills | Status: DC

## 2019-07-11 NOTE — Patient Instructions (Addendum)
F/U with pap  And foot exam with Md in  5 weeks, call if you need me sooner, flu vaccine at visit  PLease sched mammogram at checkoout  Labs this week fasting  lipid, cmp and EGFr, HBA1C   Effexor script is being sent to the health fdept for your depression as we discussed  Thanks for choosing [facility], we consider it a privelige to serve you.

## 2019-07-13 DIAGNOSIS — E1122 Type 2 diabetes mellitus with diabetic chronic kidney disease: Secondary | ICD-10-CM | POA: Diagnosis not present

## 2019-07-13 DIAGNOSIS — N182 Chronic kidney disease, stage 2 (mild): Secondary | ICD-10-CM | POA: Diagnosis not present

## 2019-07-13 DIAGNOSIS — E782 Mixed hyperlipidemia: Secondary | ICD-10-CM | POA: Diagnosis not present

## 2019-07-13 DIAGNOSIS — Z794 Long term (current) use of insulin: Secondary | ICD-10-CM | POA: Diagnosis not present

## 2019-07-14 ENCOUNTER — Encounter: Payer: Self-pay | Admitting: Family Medicine

## 2019-07-14 LAB — COMPLETE METABOLIC PANEL WITH GFR
AG Ratio: 1.2 (calc) (ref 1.0–2.5)
ALT: 20 U/L (ref 6–29)
AST: 25 U/L (ref 10–35)
Albumin: 4.5 g/dL (ref 3.6–5.1)
Alkaline phosphatase (APISO): 80 U/L (ref 37–153)
BUN: 7 mg/dL (ref 7–25)
CO2: 27 mmol/L (ref 20–32)
Calcium: 9.6 mg/dL (ref 8.6–10.4)
Chloride: 101 mmol/L (ref 98–110)
Creat: 0.92 mg/dL (ref 0.50–1.05)
GFR, Est African American: 80 mL/min/{1.73_m2} (ref >=60)
GFR, Est Non African American: 69 mL/min/{1.73_m2} (ref >=60)
Globulin: 3.7 g/dL (calc) (ref 1.9–3.7)
Glucose, Bld: 118 mg/dL — ABNORMAL HIGH (ref 65–99)
Potassium: 3.8 mmol/L (ref 3.5–5.3)
Sodium: 141 mmol/L (ref 135–146)
Total Bilirubin: 0.3 mg/dL (ref 0.2–1.2)
Total Protein: 8.2 g/dL — ABNORMAL HIGH (ref 6.1–8.1)

## 2019-07-14 LAB — LIPID PANEL
Cholesterol: 267 mg/dL — ABNORMAL HIGH (ref <200)
HDL: 53 mg/dL (ref >=50)
LDL Cholesterol (Calc): 169 mg/dL (calc) — ABNORMAL HIGH
Non-HDL Cholesterol (Calc): 214 mg/dL (calc) — ABNORMAL HIGH (ref <130)
Total CHOL/HDL Ratio: 5 (calc) — ABNORMAL HIGH (ref <5.0)
Triglycerides: 295 mg/dL — ABNORMAL HIGH (ref <150)

## 2019-07-14 LAB — HEMOGLOBIN A1C
Hgb A1c MFr Bld: 6.9 % of total Hgb — ABNORMAL HIGH (ref <5.7)
Mean Plasma Glucose: 151 (calc)
eAG (mmol/L): 8.4 (calc)

## 2019-07-14 MED ORDER — ROSUVASTATIN CALCIUM 20 MG PO TABS: 20.0000 mg | ORAL_TABLET | Freq: Every day | ORAL | 5 refills | Status: DC

## 2019-07-14 NOTE — Assessment & Plan Note (Signed)
Leslie Lyons is reminded of the importance of commitment to daily physical activity for 30 minutes or more, as able and the need to limit carbohydrate intake to 30 to 60 grams per meal to help with blood sugar control.   The need to take medication as prescribed, test blood sugar as directed, and to call between visits if there is a concern that blood sugar is uncontrolled is also discussed.   Leslie Lyons is reminded of the importance of daily foot exam, annual eye examination, and good blood sugar, blood pressure and cholesterol control.  Diabetic Labs Latest Ref Rng & Units 07/13/2019 03/27/2019 10/03/2018 04/25/2018 01/06/2018  HbA1c <5.7 % of total Hgb 6.9(H) - 10.5(H) 6.5(H) -  Microalbumin mg/dL - 8.6 - - 1.9  Micro/Creat Ratio <30 mcg/mg creat - - - - 20  Chol <200 mg/dL 267(H) 260(H) 232(H) - -  HDL > OR = 50 mg/dL 53 59 53 - -  Calc LDL mg/dL (calc) 169(H) 164(H) 143(H) - -  Triglycerides <150 mg/dL 295(H) 201(H) 221(H) - -  Creatinine 0.50 - 1.05 mg/dL 0.92 0.87 1.08(H) 0.82 -   BP/Weight 07/11/2019 04/06/2019 03/21/2019 12/04/2018 12/04/2018 11/28/2018 71/07/5973  Systolic BP 718 550 158 - - 682 574  Diastolic BP 82 88 88 - - 87 86  Wt. (Lbs) 226 204 238 238 238 234 239.4  BMI 40.03 36.14 42.16 42.16 42.16 41.45 42.41   Foot/eye exam completion dates 01/04/2018 06/22/2017  Foot Form Completion Done Done    Updated lab needed at/ before next visit.

## 2019-07-14 NOTE — Progress Notes (Signed)
Leslie Lyons     MRN: [id number]      DOB: 1961-10-22   HPI Leslie Lyons is here for follow up and re-evaluation of chronic medical conditions, medication management and review of any available recent lab and radiology data.  Preventive health is updated, specifically  Cancer screening and Immunization.   Questions or concerns regarding consultations or procedures which the PT has had in the interim are  addressed. The PT denies any adverse reactions to current medications since the last visit.  C/o excess depression and anxiety, no suicidal or homicidal Denies polyuria, polydipsia, blurred vision , or hypoglycemic episodes. Not testing blood sugar  ROS Denies recent fever or chills. Denies sinus pressure, nasal congestion, ear pain or sore throat. Denies chest congestion, productive cough or wheezing. Denies chest pains, palpitations and leg swelling Denies abdominal pain, nausea, vomiting,diarrhea or constipation.   Denies dysuria, frequency, hesitancy or incontinence. Denies joint pain, swelling and limitation in mobility. Denies headaches, seizures, numbness, or tingling.  Denies skin break down or rash.   PE  BP 140/82   Pulse (!) 121   Temp (!) 97 F (36.1 C) (Temporal)   Resp 15   Ht 5' 3"  (1.6 m)   Wt 226 lb (102.5 kg)   SpO2 94%   BMI 40.03 kg/m   Patient alert and oriented and in no cardiopulmonary distress.  HEENT: No facial asymmetry, EOMI,   oropharynx pink and moist.  Neck supple no JVD, no mass.  Chest: Clear to auscultation bilaterally.  CVS: S1, S2 no murmurs, no S3.Regular rate.  ABD: Soft non tender.   Ext: No edema  MS: Adequate ROM spine, shoulders, hips and knees.  Skin: Intact, no ulcerations or rash noted.  Psych: Good eye contact, normal affect. Memory intact not anxious or depressed appearing.  CNS: CN 2-12 intact, power,  normal throughout.no focal deficits noted.   Assessment & Plan  Depression, major, single episode,  moderate (HCC) Effexor to be added, has not filled states no money, will attempt to get medication from health dept  Asthma in adult Controlled, no change in medication   Morbid obesity ([place])  Patient re-educated about  the importance of commitment to a  minimum of 150 minutes of exercise per week as able.  The importance of healthy food choices with portion control discussed, as well as eating regularly and within a 12 hour window most days. The need to choose "clean , green" food 50 to 75% of the time is discussed, as well as to make water the primary drink and set a goal of 64 ounces water daily.    Weight /BMI 07/11/2019 04/06/2019 03/21/2019  WEIGHT 226 lb 204 lb 238 lb  HEIGHT 5' 3"  5' 3"  5' 3"   BMI 40.03 kg/m2 36.14 kg/m2 42.16 kg/m2      Type 2 diabetes mellitus with stage 2 chronic kidney disease, with long-term current use of insulin ([place]) Leslie Lyons is reminded of the importance of commitment to daily physical activity for 30 minutes or more, as able and the need to limit carbohydrate intake to 30 to 60 grams per meal to help with blood sugar control.   The need to take medication as prescribed, test blood sugar as directed, and to call between visits if there is a concern that blood sugar is uncontrolled is also discussed.   Leslie Lyons is reminded of the importance of daily foot exam, annual eye examination, and good blood sugar, blood pressure and cholesterol control.  Diabetic Labs Latest Ref Rng & Units 07/13/2019 03/27/2019 10/03/2018 04/25/2018 01/06/2018  HbA1c <5.7 % of total Hgb 6.9(H) - 10.5(H) 6.5(H) -  Microalbumin mg/dL - 8.6 - - 1.9  Micro/Creat Ratio <30 mcg/mg creat - - - - 20  Chol <200 mg/dL 267(H) 260(H) 232(H) - -  HDL > OR = 50 mg/dL 53 59 53 - -  Calc LDL mg/dL (calc) 169(H) 164(H) 143(H) - -  Triglycerides <150 mg/dL 295(H) 201(H) 221(H) - -  Creatinine 0.50 - 1.05 mg/dL 0.92 0.87 1.08(H) 0.82 -   BP/Weight 07/11/2019 04/06/2019 03/21/2019 12/04/2018  12/04/2018 11/28/2018 34/11/9620  Systolic BP 297 989 211 - - 941 740  Diastolic BP 82 88 88 - - 87 86  Wt. (Lbs) 226 204 238 238 238 234 239.4  BMI 40.03 36.14 42.16 42.16 42.16 41.45 42.41   Foot/eye exam completion dates 01/04/2018 06/22/2017  Foot Form Completion Done Done    Updated lab needed at/ before next visit.     Essential hypertension, benign Not at goal, no med change DASH diet and commitment to daily physical activity for a minimum of 30 minutes discussed and encouraged, as a part of hypertension management. The importance of attaining a healthy weight is also discussed.  BP/Weight 07/11/2019 04/06/2019 03/21/2019 12/04/2018 12/04/2018 11/28/2018 81/02/4817  Systolic BP 563 149 702 - - 637 858  Diastolic BP 82 88 88 - - 87 86  Wt. (Lbs) 226 204 238 238 238 234 239.4  BMI 40.03 36.14 42.16 42.16 42.16 41.45 42.41

## 2019-07-14 NOTE — Assessment & Plan Note (Signed)
Not at goal, no med change DASH diet and commitment to daily physical activity for a minimum of 30 minutes discussed and encouraged, as a part of hypertension management. The importance of attaining a healthy weight is also discussed.  BP/Weight 07/11/2019 04/06/2019 03/21/2019 12/04/2018 12/04/2018 11/28/2018 91/0/6816  Systolic BP 619 694 098 - - 286 751  Diastolic BP 82 88 88 - - 87 86  Wt. (Lbs) 226 204 238 238 238 234 239.4  BMI 40.03 36.14 42.16 42.16 42.16 41.45 42.41

## 2019-07-14 NOTE — Assessment & Plan Note (Signed)
Effexor to be added, has not filled states no money, will attempt to get medication from health dept

## 2019-07-14 NOTE — Assessment & Plan Note (Signed)
Controlled, no change in medication  

## 2019-07-14 NOTE — Assessment & Plan Note (Signed)
  Patient re-educated about  the importance of commitment to a  minimum of 150 minutes of exercise per week as able.  The importance of healthy food choices with portion control discussed, as well as eating regularly and within a 12 hour window most days. The need to choose "clean , green" food 50 to 75% of the time is discussed, as well as to make water the primary drink and set a goal of 64 ounces water daily.    Weight /BMI 07/11/2019 04/06/2019 03/21/2019  WEIGHT 226 lb 204 lb 238 lb  HEIGHT 5' 3"  5' 3"  5' 3"   BMI 40.03 kg/m2 36.14 kg/m2 42.16 kg/m2

## 2019-07-27 ENCOUNTER — Telehealth: Payer: Self-pay

## 2019-07-27 ENCOUNTER — Telehealth: Payer: Self-pay | Admitting: Family Medicine

## 2019-07-27 DIAGNOSIS — Z794 Long term (current) use of insulin: Secondary | ICD-10-CM

## 2019-07-27 DIAGNOSIS — E1122 Type 2 diabetes mellitus with diabetic chronic kidney disease: Secondary | ICD-10-CM

## 2019-07-27 DIAGNOSIS — I10 Essential (primary) hypertension: Secondary | ICD-10-CM

## 2019-07-27 DIAGNOSIS — E782 Mixed hyperlipidemia: Secondary | ICD-10-CM

## 2019-07-27 NOTE — Telephone Encounter (Signed)
Labs entered per MD

## 2019-07-27 NOTE — Telephone Encounter (Signed)
Pt returning call , please call back

## 2019-07-30 NOTE — Telephone Encounter (Signed)
Spoke with patient and gave lab results

## 2019-07-31 ENCOUNTER — Other Ambulatory Visit: Payer: Self-pay | Admitting: Family Medicine

## 2019-07-31 DIAGNOSIS — E782 Mixed hyperlipidemia: Secondary | ICD-10-CM

## 2019-07-31 MED ORDER — ATORVASTATIN CALCIUM 20 MG PO TABS: 20.0000 mg | ORAL_TABLET | Freq: Every day | ORAL | 3 refills | Status: DC

## 2019-08-22 ENCOUNTER — Ambulatory Visit: Payer: Medicare Other | Admitting: Family Medicine

## 2019-08-28 ENCOUNTER — Other Ambulatory Visit: Payer: Self-pay

## 2019-08-28 ENCOUNTER — Ambulatory Visit (INDEPENDENT_AMBULATORY_CARE_PROVIDER_SITE_OTHER): Payer: Medicare Other | Admitting: Family Medicine

## 2019-08-28 ENCOUNTER — Encounter: Payer: Self-pay | Admitting: Family Medicine

## 2019-08-28 VITALS — BP 150/90 | HR 98 | Temp 97.1°F | Resp 15 | Ht 63.0 in

## 2019-08-28 DIAGNOSIS — N182 Chronic kidney disease, stage 2 (mild): Secondary | ICD-10-CM | POA: Diagnosis not present

## 2019-08-28 DIAGNOSIS — Z794 Long term (current) use of insulin: Secondary | ICD-10-CM | POA: Diagnosis not present

## 2019-08-28 DIAGNOSIS — F4323 Adjustment disorder with mixed anxiety and depressed mood: Secondary | ICD-10-CM

## 2019-08-28 DIAGNOSIS — E1122 Type 2 diabetes mellitus with diabetic chronic kidney disease: Secondary | ICD-10-CM

## 2019-08-28 DIAGNOSIS — I10 Essential (primary) hypertension: Secondary | ICD-10-CM | POA: Diagnosis not present

## 2019-08-28 DIAGNOSIS — F321 Major depressive disorder, single episode, moderate: Secondary | ICD-10-CM | POA: Diagnosis not present

## 2019-08-28 DIAGNOSIS — Z23 Encounter for immunization: Secondary | ICD-10-CM

## 2019-08-28 NOTE — Progress Notes (Signed)
Leslie Lyons     MRN: [id number]      DOB: [birth year]   HPI Leslie Lyons is here for follow up and re-evaluation of chronic medical conditions, medication management and review of any available recent lab and radiology data.  Preventive health is updated, specifically  Cancer screening and Immunization.   Questions or concerns regarding consultations or procedures which the PT has had in the interim are  addressed. The PT denies any adverse reactions to current medications since the last visit.  There are no new concerns.  There are no specific complaints   ROS Denies recent fever or chills. Denies sinus pressure, nasal congestion, ear pain or sore throat. Denies chest congestion, productive cough or wheezing. Denies chest pains, palpitations and leg swelling Denies abdominal pain, nausea, vomiting,diarrhea or constipation.   Denies dysuria, frequency, hesitancy or incontinence. Denies joint pain, swelling and limitation in mobility. Denies headaches, seizures, numbness, or tingling. Denies depression, anxiety or insomnia. Denies skin break down or rash.   PE  BP (!) 150/90   Pulse 98   Temp (!) 97.1 F (36.2 C) (Temporal)   Resp 15   Ht 5' 3"  (1.6 m)   SpO2 96%   BMI 40.03 kg/m   Patient alert and oriented and in no cardiopulmonary distress.  HEENT: No facial asymmetry, EOMI,   oropharynx pink and moist.  Neck supple no JVD, no mass.  Chest: Clear to auscultation bilaterally.  CVS: S1, S2 no murmurs, no S3.Regular rate.  ABD: Soft non tender.   Ext: No edema  MSe ROM spine, shoulders, hips and knees.  Skin: Intact, no ulcerations or rash noted.  Psych: Good eye contact, normal affect. Memory intact not anxious or depressed appearing.  CNS: CN 2-12 intact, power,  normal throughout.no focal deficits noted.   Assessment & Plan  Essential hypertension, benign Uncontrolled , non compliant with med and uncertain what she takes, need to bring to visit  DASH diet and commitment to daily physical activity for a minimum of 30 minutes discussed and encouraged, as a part of hypertension management. The importance of attaining a healthy weight is also discussed.  BP/Weight 08/28/2019 07/11/2019 04/06/2019 03/21/2019 12/04/2018 12/04/2018 28/78/6767  Systolic BP 209 470 962 836 - - 629  Diastolic BP 90 82 88 88 - - 87  Wt. (Lbs) - 226 204 238 238 238 234  BMI 40.03 40.03 36.14 42.16 42.16 42.16 41.45       Morbid obesity (HCC)  Patient re-educated about  the importance of commitment to a  minimum of 150 minutes of exercise per week as able.  The importance of healthy food choices with portion control discussed, as well as eating regularly and within a 12 hour window most days. The need to choose "clean , green" food 50 to 75% of the time is discussed, as well as to make water the primary drink and set a goal of 64 ounces water daily.    Weight /BMI 08/28/2019 07/11/2019 04/06/2019  WEIGHT - 226 lb 204 lb  HEIGHT 5' 3"  5' 3"  5' 3"   BMI 40.03 kg/m2 40.03 kg/m2 36.14 kg/m2      Depression, major, single episode, moderate ([place]) Remains very depressed and anxious, resists psych management which she needs. Not suicidal or homicidal  Type 2 diabetes mellitus with stage 2 chronic kidney disease, with long-term current use of insulin ([place]) Controlled and manged by Endo Leslie Lyons is reminded of the importance of commitment to daily physical activity for  30 minutes or more, as able and the need to limit carbohydrate intake to 30 to 60 grams per meal to help with blood sugar control.   The need to take medication as prescribed, test blood sugar as directed, and to call between visits if there is a concern that blood sugar is uncontrolled is also discussed.   Leslie Lyons is reminded of the importance of daily foot exam, annual eye examination, and good blood sugar, blood pressure and cholesterol control.  Diabetic Labs Latest Ref Rng & Units 07/13/2019  03/27/2019 10/03/2018 04/25/2018 01/06/2018  HbA1c <5.7 % of total Hgb 6.9(H) - 10.5(H) 6.5(H) -  Microalbumin mg/dL - 8.6 - - 1.9  Micro/Creat Ratio <30 mcg/mg creat - - - - 20  Chol <200 mg/dL 267(H) 260(H) 232(H) - -  HDL > OR = 50 mg/dL 53 59 53 - -  Calc LDL mg/dL (calc) 169(H) 164(H) 143(H) - -  Triglycerides <150 mg/dL 295(H) 201(H) 221(H) - -  Creatinine 0.50 - 1.05 mg/dL 0.92 0.87 1.08(H) 0.82 -   BP/Weight 08/28/2019 07/11/2019 04/06/2019 03/21/2019 12/04/2018 12/04/2018 75/43/6067  Systolic BP 703 403 524 818 - - 590  Diastolic BP 90 82 88 88 - - 87  Wt. (Lbs) - 226 204 238 238 238 234  BMI 40.03 40.03 36.14 42.16 42.16 42.16 41.45   Foot/eye exam completion dates 01/04/2018 06/22/2017  Foot Form Completion Done Done

## 2019-08-28 NOTE — Patient Instructions (Addendum)
F/U with pap in Novemeber, call if you need me before  Flu vaccine today  Good foot exam  Need to take medications and bring to next visit, blood pressure is high  Reconsider therapy

## 2019-08-29 ENCOUNTER — Encounter: Payer: Self-pay | Admitting: Family Medicine

## 2019-08-29 MED ORDER — ALPRAZOLAM 1 MG PO TABS: 1.0000 mg | ORAL_TABLET | Freq: Two times a day (BID) | ORAL | 3 refills | Status: DC

## 2019-08-29 NOTE — Assessment & Plan Note (Signed)
Remains very depressed and anxious, resists psych management which she needs. Not suicidal or homicidal

## 2019-08-29 NOTE — Assessment & Plan Note (Signed)
Pt continue alprazolam as before

## 2019-08-29 NOTE — Assessment & Plan Note (Signed)
Uncontrolled , non compliant with med and uncertain what she takes, need to bring to visit DASH diet and commitment to daily physical activity for a minimum of 30 minutes discussed and encouraged, as a part of hypertension management. The importance of attaining a healthy weight is also discussed.  BP/Weight 08/28/2019 07/11/2019 04/06/2019 03/21/2019 12/04/2018 12/04/2018 34/01/7095  Systolic BP 438 381 840 375 - - 436  Diastolic BP 90 82 88 88 - - 87  Wt. (Lbs) - 226 204 238 238 238 234  BMI 40.03 40.03 36.14 42.16 42.16 42.16 41.45

## 2019-08-29 NOTE — Assessment & Plan Note (Signed)
  Patient re-educated about  the importance of commitment to a  minimum of 150 minutes of exercise per week as able.  The importance of healthy food choices with portion control discussed, as well as eating regularly and within a 12 hour window most days. The need to choose "clean , green" food 50 to 75% of the time is discussed, as well as to make water the primary drink and set a goal of 64 ounces water daily.    Weight /BMI 08/28/2019 07/11/2019 04/06/2019  WEIGHT - 226 lb 204 lb  HEIGHT 5' 3"  5' 3"  5' 3"   BMI 40.03 kg/m2 40.03 kg/m2 36.14 kg/m2

## 2019-08-29 NOTE — Assessment & Plan Note (Signed)
Controlled and manged by Endo Ms. Rames is reminded of the importance of commitment to daily physical activity for 30 minutes or more, as able and the need to limit carbohydrate intake to 30 to 60 grams per meal to help with blood sugar control.   The need to take medication as prescribed, test blood sugar as directed, and to call between visits if there is a concern that blood sugar is uncontrolled is also discussed.   Ms. Laverne is reminded of the importance of daily foot exam, annual eye examination, and good blood sugar, blood pressure and cholesterol control.  Diabetic Labs Latest Ref Rng & Units 07/13/2019 03/27/2019 10/03/2018 04/25/2018 01/06/2018  HbA1c <5.7 % of total Hgb 6.9(H) - 10.5(H) 6.5(H) -  Microalbumin mg/dL - 8.6 - - 1.9  Micro/Creat Ratio <30 mcg/mg creat - - - - 20  Chol <200 mg/dL 267(H) 260(H) 232(H) - -  HDL > OR = 50 mg/dL 53 59 53 - -  Calc LDL mg/dL (calc) 169(H) 164(H) 143(H) - -  Triglycerides <150 mg/dL 295(H) 201(H) 221(H) - -  Creatinine 0.50 - 1.05 mg/dL 0.92 0.87 1.08(H) 0.82 -   BP/Weight 08/28/2019 07/11/2019 04/06/2019 03/21/2019 12/04/2018 12/04/2018 21/22/4825  Systolic BP 003 704 888 916 - - 945  Diastolic BP 90 82 88 88 - - 87  Wt. (Lbs) - 226 204 238 238 238 234  BMI 40.03 40.03 36.14 42.16 42.16 42.16 41.45   Foot/eye exam completion dates 01/04/2018 06/22/2017  Foot Form Completion Done Done

## 2019-09-28 ENCOUNTER — Telehealth: Payer: Self-pay | Admitting: *Deleted

## 2019-09-28 NOTE — Telephone Encounter (Signed)
Leslie Lyons with [facility] called needing a recert order for the CAP program this can be left on the voicemail and she can be reached at [phone]

## 2019-09-28 NOTE — Telephone Encounter (Signed)
Spoke with Verdis Frederickson and gave recert order for CAP program

## 2019-10-30 ENCOUNTER — Other Ambulatory Visit: Payer: Self-pay

## 2019-10-30 ENCOUNTER — Ambulatory Visit (INDEPENDENT_AMBULATORY_CARE_PROVIDER_SITE_OTHER): Payer: Medicare Other | Admitting: Family Medicine

## 2019-10-30 ENCOUNTER — Other Ambulatory Visit (HOSPITAL_COMMUNITY)
Admission: RE | Admit: 2019-10-30 | Discharge: 2019-10-30 | Disposition: A | Discharge status: Home / Self Care | Payer: Medicare Other | Source: Ambulatory Visit | Attending: Family Medicine | Admitting: Family Medicine

## 2019-10-30 ENCOUNTER — Encounter: Payer: Self-pay | Admitting: Family Medicine

## 2019-10-30 VITALS — BP 148/98 | HR 117 | Temp 97.3°F | Resp 15 | Ht 63.0 in | Wt 227.0 lb

## 2019-10-30 DIAGNOSIS — Z8541 Personal history of malignant neoplasm of cervix uteri: Secondary | ICD-10-CM

## 2019-10-30 DIAGNOSIS — E782 Mixed hyperlipidemia: Secondary | ICD-10-CM

## 2019-10-30 DIAGNOSIS — Z794 Long term (current) use of insulin: Secondary | ICD-10-CM

## 2019-10-30 DIAGNOSIS — Z1211 Encounter for screening for malignant neoplasm of colon: Secondary | ICD-10-CM

## 2019-10-30 DIAGNOSIS — Z1151 Encounter for screening for human papillomavirus (HPV): Secondary | ICD-10-CM | POA: Diagnosis not present

## 2019-10-30 DIAGNOSIS — F321 Major depressive disorder, single episode, moderate: Secondary | ICD-10-CM | POA: Diagnosis not present

## 2019-10-30 DIAGNOSIS — Z124 Encounter for screening for malignant neoplasm of cervix: Secondary | ICD-10-CM | POA: Diagnosis not present

## 2019-10-30 DIAGNOSIS — G8929 Other chronic pain: Secondary | ICD-10-CM | POA: Diagnosis not present

## 2019-10-30 DIAGNOSIS — E1122 Type 2 diabetes mellitus with diabetic chronic kidney disease: Secondary | ICD-10-CM | POA: Diagnosis not present

## 2019-10-30 DIAGNOSIS — I10 Essential (primary) hypertension: Secondary | ICD-10-CM

## 2019-10-30 DIAGNOSIS — N182 Chronic kidney disease, stage 2 (mild): Secondary | ICD-10-CM | POA: Diagnosis not present

## 2019-10-30 DIAGNOSIS — M549 Dorsalgia, unspecified: Secondary | ICD-10-CM | POA: Diagnosis not present

## 2019-10-30 DIAGNOSIS — E559 Vitamin D deficiency, unspecified: Secondary | ICD-10-CM | POA: Diagnosis not present

## 2019-10-30 MED ORDER — KETOROLAC TROMETHAMINE 60 MG/2ML IM SOLN
60.0000 mg | Freq: Once | INTRAMUSCULAR | Status: AC
  Administered 2019-10-30: 13:00:00 60 mg via INTRAMUSCULAR

## 2019-10-30 MED ORDER — LISINOPRIL 10 MG PO TABS: 10.0000 mg | ORAL_TABLET | Freq: Every day | ORAL | 5 refills | Status: DC

## 2019-10-30 MED ORDER — ALPRAZOLAM 1 MG PO TABS: ORAL_TABLET | ORAL | 3 refills | Status: DC

## 2019-10-30 MED ORDER — METHYLPREDNISOLONE ACETATE 80 MG/ML IJ SUSP
80.0000 mg | Freq: Once | INTRAMUSCULAR | Status: AC
  Administered 2019-10-30: 80 mg via INTRAMUSCULAR

## 2019-10-30 MED ORDER — GLIPIZIDE ER 10 MG PO TB24: 10.0000 mg | ORAL_TABLET | Freq: Every day | ORAL | 5 refills | Status: DC

## 2019-10-30 NOTE — Patient Instructions (Addendum)
F/U in office with MD in 4 months, call if you need me soomer   Please schedule mammogram at checkout, pt will follow through she reports   .Labs due, lipid, cmp and eGFr,hBA1C , CBC and vit D  Colonoscopy is past due, will arrange this, you need it   Thanks for choosing [facility], we consider it a privelige to serve you.

## 2019-11-02 LAB — CYTOLOGY - PAP
Comment: NEGATIVE
Diagnosis: NEGATIVE
High risk HPV: NEGATIVE

## 2019-11-03 NOTE — Assessment & Plan Note (Signed)
Uncontrolled, not suicidal or homicidal, no interest in therapy or Psychiatry

## 2019-11-03 NOTE — Assessment & Plan Note (Signed)
Elevated at visit , but feels that painis amajor contributor DASH diet and commitment to daily physical activity for a minimum of 30 minutes discussed and encouraged, as a part of hypertension management. The importance of attaining a healthy weight is also discussed.  BP/Weight 10/30/2019 08/28/2019 07/11/2019 04/06/2019 03/21/2019 0/04/2375 01/06/3150  Systolic BP 761 607 371 062 694 - -  Diastolic BP 98 90 82 88 88 - -  Wt. (Lbs) 227 - 226 204 238 238 238  BMI 40.21 40.03 40.03 36.14 42.16 42.16 42.16

## 2019-11-03 NOTE — Assessment & Plan Note (Signed)
  Patient re-educated about  the importance of commitment to a  minimum of 150 minutes of exercise per week as able.  The importance of healthy food choices with portion control discussed, as well as eating regularly and within a 12 hour window most days. The need to choose "clean , green" food 50 to 75% of the time is discussed, as well as to make water the primary drink and set a goal of 64 ounces water daily.    Weight /BMI 10/30/2019 08/28/2019 07/11/2019  WEIGHT 227 lb - 226 lb  HEIGHT 5' 3"  5' 3"  5' 3"   BMI 40.21 kg/m2 40.03 kg/m2 40.03 kg/m2

## 2019-11-03 NOTE — Progress Notes (Signed)
Leslie Lyons     MRN: [id number]      DOB: 13-Apr-1961   HPI Leslie Lyons is here for follow up and re-evaluation of chronic medical conditions, medication management and review of any available recent lab and radiology data.  Preventive health is updated, specifically  Cancer screening and Immunization.   The PT denies any adverse reactions to current medications since the last visit.  There are no new concerns.  Needs pap and pelvic. C/o uncontrolled generalized pain  ROS Denies recent fever or chills. Denies sinus pressure, nasal congestion, ear pain or sore throat. Denies chest congestion, productive cough or wheezing. Denies chest pains, palpitations and leg swelling Denies abdominal pain, nausea, vomiting,diarrhea or constipation.   Denies dysuria, frequency, hesitancy or incontinence. C/o chronic  joint pain, swelling and limitation in mobility. Denies headaches, seizures, numbness, or tingling. C/o  depression, anxiety and  Insomnia.not suicidal or homicidal Denies skin break down or rash.   PE  BP (!) 148/98   Pulse (!) 117   Temp (!) 97.3 F (36.3 C) (Temporal)   Resp 15   Ht 5' 3"  (1.6 m)   Wt 227 lb (103 kg)   SpO2 93%   BMI 40.21 kg/m   Patient alert and oriented and in no cardiopulmonary distress.  HEENT: No facial asymmetry, EOMI,     Neck decreased ROM .  Chest: Clear to auscultation bilaterally.  CVS: S1, S2 no murmurs, no S3.Regular rate.  ABD: Soft non tender.  Pelvic: normal external female genitalia, no ulcerative lesion, no inguinal adenopathy Uterus absent, no adnexal tenderness, white discharge, vaginal mucosa moist, not erythematous, no ulceration Cervix absent  Ext: No edema  MS: decreased  ROM spine, shoulders, hips and knees.  Skin: Intact, no ulcerations or rash noted.  Psych: Good eye contact, normal affect. Memory mildly impaired  not anxious or depressed appearing.  CNS: CN 2-12 intact, power,  normal throughout.no focal  deficits noted.   Assessment & Plan  Essential hypertension, benign Elevated at visit , but feels that painis amajor contributor DASH diet and commitment to daily physical activity for a minimum of 30 minutes discussed and encouraged, as a part of hypertension management. The importance of attaining a healthy weight is also discussed.  BP/Weight 10/30/2019 08/28/2019 07/11/2019 04/06/2019 03/21/2019 05/07/6788 02/04/1016  Systolic BP 510 258 527 782 423 - -  Diastolic BP 98 90 82 88 88 - -  Wt. (Lbs) 227 - 226 204 238 238 238  BMI 40.21 40.03 40.03 36.14 42.16 42.16 42.16       Morbid obesity (HCC)  Patient re-educated about  the importance of commitment to a  minimum of 150 minutes of exercise per week as able.  The importance of healthy food choices with portion control discussed, as well as eating regularly and within a 12 hour window most days. The need to choose "clean , green" food 50 to 75% of the time is discussed, as well as to make water the primary drink and set a goal of 64 ounces water daily.    Weight /BMI 10/30/2019 08/28/2019 07/11/2019  WEIGHT 227 lb - 226 lb  HEIGHT 5' 3"  5' 3"  5' 3"   BMI 40.21 kg/m2 40.03 kg/m2 40.03 kg/m2      Depression, major, single episode, moderate (HCC) Uncontrolled, not suicidal or homicidal, no interest in therapy or Psychiatry  Hx of cervical cancer PAP SENT, EXAM AS DOCUMENTED  Type 2 diabetes mellitus with stage 2 chronic kidney disease, with  long-term current use of insulin ([place]) Updated lab needed at/ before next visit. Leslie Lyons is reminded of the importance of commitment to daily physical activity for 30 minutes or more, as able and the need to limit carbohydrate intake to 30 to 60 grams per meal to help with blood sugar control.   The need to take medication as prescribed, test blood sugar as directed, and to call between visits if there is a concern that blood sugar is uncontrolled is also discussed.   Leslie Lyons is  reminded of the importance of daily foot exam, annual eye examination, and good blood sugar, blood pressure and cholesterol control.  Diabetic Labs Latest Ref Rng & Units 07/13/2019 03/27/2019 10/03/2018 04/25/2018 01/06/2018  HbA1c <5.7 % of total Hgb 6.9(H) - 10.5(H) 6.5(H) -  Microalbumin mg/dL - 8.6 - - 1.9  Micro/Creat Ratio <30 mcg/mg creat - - - - 20  Chol <200 mg/dL 267(H) 260(H) 232(H) - -  HDL > OR = 50 mg/dL 53 59 53 - -  Calc LDL mg/dL (calc) 169(H) 164(H) 143(H) - -  Triglycerides <150 mg/dL 295(H) 201(H) 221(H) - -  Creatinine 0.50 - 1.05 mg/dL 0.92 0.87 1.08(H) 0.82 -   BP/Weight 10/30/2019 08/28/2019 07/11/2019 04/06/2019 03/21/2019 02/02/676 0/01/4034  Systolic BP 248 185 909 311 216 - -  Diastolic BP 98 90 82 88 88 - -  Wt. (Lbs) 227 - 226 204 238 238 238  BMI 40.21 40.03 40.03 36.14 42.16 42.16 42.16   Foot/eye exam completion dates 08/28/2019 01/04/2018  Foot Form Completion Done Done        Mixed hyperlipidemia Hyperlipidemia:Low fat diet discussed and encouraged.   Lipid Panel  Lab Results  Component Value Date   CHOL 267 (H) 07/13/2019   HDL 53 07/13/2019   LDLCALC 169 (H) 07/13/2019   TRIG 295 (H) 07/13/2019   CHOLHDL 5.0 (H) 07/13/2019   Updated lab needed at/ before next visit.

## 2019-11-04 ENCOUNTER — Encounter: Payer: Self-pay | Admitting: Family Medicine

## 2019-11-04 NOTE — Assessment & Plan Note (Signed)
PAP SENT, EXAM AS DOCUMENTED

## 2019-11-04 NOTE — Assessment & Plan Note (Signed)
Hyperlipidemia:Low fat diet discussed and encouraged.   Lipid Panel  Lab Results  Component Value Date   CHOL 267 (H) 07/13/2019   HDL 53 07/13/2019   LDLCALC 169 (H) 07/13/2019   TRIG 295 (H) 07/13/2019   CHOLHDL 5.0 (H) 07/13/2019   Updated lab needed at/ before next visit.

## 2019-11-04 NOTE — Assessment & Plan Note (Signed)
Updated lab needed at/ before next visit. Leslie Lyons is reminded of the importance of commitment to daily physical activity for 30 minutes or more, as able and the need to limit carbohydrate intake to 30 to 60 grams per meal to help with blood sugar control.   The need to take medication as prescribed, test blood sugar as directed, and to call between visits if there is a concern that blood sugar is uncontrolled is also discussed.   Leslie Lyons is reminded of the importance of daily foot exam, annual eye examination, and good blood sugar, blood pressure and cholesterol control.  Diabetic Labs Latest Ref Rng & Units 07/13/2019 03/27/2019 10/03/2018 04/25/2018 01/06/2018  HbA1c <5.7 % of total Hgb 6.9(H) - 10.5(H) 6.5(H) -  Microalbumin mg/dL - 8.6 - - 1.9  Micro/Creat Ratio <30 mcg/mg creat - - - - 20  Chol <200 mg/dL 267(H) 260(H) 232(H) - -  HDL > OR = 50 mg/dL 53 59 53 - -  Calc LDL mg/dL (calc) 169(H) 164(H) 143(H) - -  Triglycerides <150 mg/dL 295(H) 201(H) 221(H) - -  Creatinine 0.50 - 1.05 mg/dL 0.92 0.87 1.08(H) 0.82 -   BP/Weight 10/30/2019 08/28/2019 07/11/2019 04/06/2019 03/21/2019 06/01/1637 03/03/3645  Systolic BP 803 212 248 250 037 - -  Diastolic BP 98 90 82 88 88 - -  Wt. (Lbs) 227 - 226 204 238 238 238  BMI 40.21 40.03 40.03 36.14 42.16 42.16 42.16   Foot/eye exam completion dates 08/28/2019 01/04/2018  Foot Form Completion Done Done

## 2019-11-06 ENCOUNTER — Telehealth: Payer: Self-pay | Admitting: *Deleted

## 2019-11-06 ENCOUNTER — Encounter: Payer: Self-pay | Admitting: Gastroenterology

## 2019-11-06 NOTE — Telephone Encounter (Signed)
Called patient to discuss message. No answer, left generic message requesting call back

## 2019-11-06 NOTE — Telephone Encounter (Signed)
Pt would like a call back she need to speak to someone but did not want to go into detail with me

## 2019-11-06 NOTE — Telephone Encounter (Signed)
Called patient to discuss message. No answer, unable to leave message. Will try again tomorrow.

## 2019-12-03 ENCOUNTER — Ambulatory Visit (HOSPITAL_COMMUNITY): Payer: Medicare Other

## 2019-12-10 ENCOUNTER — Other Ambulatory Visit: Payer: Self-pay

## 2019-12-10 ENCOUNTER — Ambulatory Visit (HOSPITAL_COMMUNITY)
Admission: RE | Admit: 2019-12-10 | Discharge: 2019-12-10 | Disposition: A | Discharge status: Home / Self Care | Payer: Medicare Other | Source: Ambulatory Visit | Attending: Family Medicine | Admitting: Family Medicine

## 2019-12-10 DIAGNOSIS — Z1231 Encounter for screening mammogram for malignant neoplasm of breast: Secondary | ICD-10-CM | POA: Diagnosis not present

## 2019-12-20 ENCOUNTER — Other Ambulatory Visit: Payer: Self-pay | Admitting: "Endocrinology

## 2019-12-20 ENCOUNTER — Ambulatory Visit: Payer: Medicare Other | Admitting: "Endocrinology

## 2019-12-20 DIAGNOSIS — E1122 Type 2 diabetes mellitus with diabetic chronic kidney disease: Secondary | ICD-10-CM

## 2019-12-20 DIAGNOSIS — Z794 Long term (current) use of insulin: Secondary | ICD-10-CM

## 2019-12-20 NOTE — Progress Notes (Signed)
a1c

## 2019-12-25 ENCOUNTER — Ambulatory Visit: Payer: Medicare Other | Admitting: Gastroenterology

## 2020-01-01 ENCOUNTER — Ambulatory Visit: Payer: Medicare Other | Admitting: "Endocrinology

## 2020-01-07 DIAGNOSIS — N182 Chronic kidney disease, stage 2 (mild): Secondary | ICD-10-CM | POA: Diagnosis not present

## 2020-01-07 DIAGNOSIS — E1122 Type 2 diabetes mellitus with diabetic chronic kidney disease: Secondary | ICD-10-CM | POA: Diagnosis not present

## 2020-01-07 DIAGNOSIS — Z794 Long term (current) use of insulin: Secondary | ICD-10-CM | POA: Diagnosis not present

## 2020-01-08 LAB — COMPREHENSIVE METABOLIC PANEL
AG Ratio: 1.5 (calc) (ref 1.0–2.5)
ALT: 18 U/L (ref 6–29)
AST: 20 U/L (ref 10–35)
Albumin: 4.9 g/dL (ref 3.6–5.1)
Alkaline phosphatase (APISO): 87 U/L (ref 37–153)
BUN/Creatinine Ratio: 12 (calc) (ref 6–22)
BUN: 18 mg/dL (ref 7–25)
CO2: 24 mmol/L (ref 20–32)
Calcium: 10.1 mg/dL (ref 8.6–10.4)
Chloride: 102 mmol/L (ref 98–110)
Creat: 1.56 mg/dL — ABNORMAL HIGH (ref 0.50–1.05)
Globulin: 3.2 g/dL (calc) (ref 1.9–3.7)
Glucose, Bld: 198 mg/dL — ABNORMAL HIGH (ref 65–139)
Potassium: 4.1 mmol/L (ref 3.5–5.3)
Sodium: 138 mmol/L (ref 135–146)
Total Bilirubin: 0.4 mg/dL (ref 0.2–1.2)
Total Protein: 8.1 g/dL (ref 6.1–8.1)

## 2020-01-08 LAB — HEMOGLOBIN A1C
Hgb A1c MFr Bld: 7.1 % of total Hgb — ABNORMAL HIGH (ref <5.7)
Mean Plasma Glucose: 157 (calc)
eAG (mmol/L): 8.7 (calc)

## 2020-01-11 ENCOUNTER — Other Ambulatory Visit: Payer: Self-pay

## 2020-01-11 ENCOUNTER — Ambulatory Visit: Payer: Medicare Other | Admitting: "Endocrinology

## 2020-01-14 ENCOUNTER — Ambulatory Visit (INDEPENDENT_AMBULATORY_CARE_PROVIDER_SITE_OTHER): Payer: Medicare Other | Admitting: "Endocrinology

## 2020-01-14 ENCOUNTER — Encounter: Payer: Self-pay | Admitting: "Endocrinology

## 2020-01-14 DIAGNOSIS — E1122 Type 2 diabetes mellitus with diabetic chronic kidney disease: Secondary | ICD-10-CM

## 2020-01-14 DIAGNOSIS — E782 Mixed hyperlipidemia: Secondary | ICD-10-CM

## 2020-01-14 DIAGNOSIS — N182 Chronic kidney disease, stage 2 (mild): Secondary | ICD-10-CM | POA: Diagnosis not present

## 2020-01-14 DIAGNOSIS — I10 Essential (primary) hypertension: Secondary | ICD-10-CM

## 2020-01-14 DIAGNOSIS — Z794 Long term (current) use of insulin: Secondary | ICD-10-CM

## 2020-01-14 NOTE — Progress Notes (Signed)
01/14/2020, 2:20 PM                                                    Endocrinology Telehealth Visit Follow up Note -During COVID -19 Pandemic  This visit type was conducted due to national recommendations for restrictions regarding the COVID-19 Pandemic  in an effort to limit this patient's exposure and mitigate transmission of the corona virus.  Due to her co-morbid illnesses, Leslie Lyons is at  moderate to high risk for complications without adequate follow up.  This format is felt to be most appropriate for her at this time.  I connected with this patient on 01/14/2020   by telephone and verified that I am speaking with the correct person using two identifiers. Leslie Lyons, 31-Mar-1961. she has verbally consented to this visit. All issues noted in this document were discussed and addressed. The format was not optimal for physical exam.    Subjective:    Patient ID: Leslie Lyons, female    DOB: [birth year].  Leslie Lyons is being engaged in telehealth via telephone in follow-up after she was seen in consultation for management of currently uncontrolled symptomatic diabetes requested by  [name], MD.   Past Medical History:  Diagnosis Date  . Abnormal transaminases   . Anxiety   . Bronchial asthma    with acute exacerbation  . Cancer ([place]) 1980   cervical, diag  at age 59  . Depression   . Diabetes ([place])   . Headache(784.0)    with reduced vision in right eye for one month   . Hyperlipidemia   . Hypertension   . Obesity    Past Surgical History:  Procedure Laterality Date  . ABDOMINAL HYSTERECTOMY    . COLONOSCOPY WITH ESOPHAGOGASTRODUODENOSCOPY (EGD)  11/2008   Dr. Arnoldo Morale: Conscious sedation.  Hiatal hernia.  Hemorrhoids. PATIENT REPORTS BEING AWAKE DURING PROCEDURE  . CRYOTHERAPY     for abnormal pap  . HEMORRHOID SURGERY     dr. Irving Shows  . PARTIAL  HYSTERECTOMY     Social History   Socioeconomic History  . Marital status: Married    Spouse name: Not on file  . Number of children: 2  . Years of education: Not on file  . Highest education level: Not on file  Occupational History  . Occupation: homemaker   Tobacco Use  . Smoking status: Former Smoker    Packs/day: 0.25    Years: 2.00    Pack years: 0.50    Types: Cigarettes    Quit date: 12/04/2015    Years since quitting: 4.1  . Smokeless tobacco: Never Used  Substance and Sexual Activity  . Alcohol use: No  . Drug use: No  . Sexual activity: Not Currently  Other Topics Concern  . Not on file  Social History Narrative  . Not on file   Social Determinants of Health   Financial Resource Strain: Low  Risk   . Difficulty of Paying Living Expenses: Not hard at all  Food Insecurity: No Food Insecurity  . Worried About Charity fundraiser in the Last Year: Never true  . Ran Out of Food in the Last Year: Never true  Transportation Needs: No Transportation Needs  . Lack of Transportation (Medical): No  . Lack of Transportation (Non-Medical): No  Physical Activity: Inactive  . Days of Exercise per Week: 0 days  . Minutes of Exercise per Session: 0 min  Stress: Stress Concern Present  . Feeling of Stress : Very much  Social Connections: Somewhat Isolated  . Frequency of Communication with Friends and Family: More than three times a week  . Frequency of Social Gatherings with Friends and Family: Never  . Attends Religious Services: Never  . Active Member of Clubs or Organizations: No  . Attends Archivist Meetings: Never  . Marital Status: Married   Outpatient Encounter Medications as of 01/14/2020  Medication Sig  . ACCU-CHEK FASTCLIX LANCETS MISC Twice daily testing dx e11.9  . albuterol (PROAIR HFA) 108 (90 Base) MCG/ACT inhaler Inhale 2 puffs into the lungs every 6 (six) hours as needed.  . ALPRAZolam (XANAX) 1 MG tablet Take one tablet by mouth two times  daily for anxiety  . atorvastatin (LIPITOR) 20 MG tablet Take 1 tablet (20 mg total) by mouth daily.  . blood glucose meter kit and supplies Dispense based on patient and insurance preference. Once daily testing dx e11.9  . Cholecalciferol (VITAMIN D3) 125 MCG (5000 UT) CAPS Take 1 capsule (5,000 Units total) by mouth daily.  . DULoxetine (CYMBALTA) 60 MG capsule Take 60 mg by mouth daily.  Marland Kitchen ezetimibe (ZETIA) 10 MG tablet Take 1 tablet (10 mg total) by mouth daily. (Patient taking differently: Take 10 mg by mouth daily. )  . glipiZIDE (GLUCOTROL XL) 10 MG 24 hr tablet Take 1 tablet (10 mg total) by mouth daily with breakfast.  . glucose blood (ACCU-CHEK GUIDE) test strip Use as instructed twice daily dx e11.9  . Insulin Glargine (LANTUS SOLOSTAR) 100 UNIT/ML Solostar Pen Inject 25 Units into the skin at bedtime.  Marland Kitchen lisinopril (ZESTRIL) 10 MG tablet Take 1 tablet (10 mg total) by mouth daily.  . mometasone (NASONEX) 50 MCG/ACT nasal spray Place 2 sprays into the nose daily.  . mometasone-formoterol (DULERA) 200-5 MCG/ACT AERO Inhale 2 puffs into the lungs 2 (two) times daily.  Marland Kitchen tolterodine (DETROL LA) 2 MG 24 hr capsule Take 1 capsule (2 mg total) by mouth daily. (Patient taking differently: Take 2 mg by mouth daily. urination)  . topiramate (TOPAMAX) 100 MG tablet Take 100 mg by mouth 2 (two) times daily.   No facility-administered encounter medications on file as of 01/14/2020.    ALLERGIES: Allergies  Allergen Reactions  . Janumet [Sitagliptin-Metformin Hcl]     States she vomits up the whole pill after taking this  . Robaxin [Methocarbamol] Swelling    VACCINATION STATUS: Immunization History  Administered Date(s) Administered  . Influenza Whole 09/25/2007, 11/04/2009, 09/22/2010  . Influenza,inj,Quad PF,6+ Mos 08/09/2013, 07/22/2014, 11/17/2015, 01/04/2018, 10/03/2018, 08/28/2019  . Pneumococcal Conjugate-13 04/25/2018  . Pneumococcal Polysaccharide-23 07/31/2009, 07/05/2016  .  Td 03/26/2009    Diabetes She presents for her follow-up diabetic visit. She has type 2 diabetes mellitus. Onset time: She was diagnosed at approximate age of 71 years. Her disease course has been fluctuating. There are no hypoglycemic associated symptoms. Pertinent negatives for hypoglycemia include no confusion, headaches, pallor or  seizures. Associated symptoms include fatigue. Pertinent negatives for diabetes include no chest pain, no polydipsia, no polyphagia and no polyuria. There are no hypoglycemic complications. Symptoms are improving. Diabetic complications include nephropathy. Risk factors for coronary artery disease include dyslipidemia, family history, hypertension, obesity, sedentary lifestyle, tobacco exposure and post-menopausal. Current diabetic treatment includes insulin injections and oral agent (monotherapy). Her weight is fluctuating minimally. She is following a generally unhealthy diet. When asked about meal planning, she reported none. She has not had a previous visit with a dietitian. She rarely participates in exercise. Her overall blood glucose range is 130-140 mg/dl. (She reports blood glucose ranging between 70-170.  Her previsit labs show A1c of 7.1%, remaining stable and overall improving from 10.5% in November.  ) An ACE inhibitor/angiotensin II receptor blocker is being taken.  Hyperlipidemia This is a chronic problem. The current episode started more than 1 year ago. The problem is uncontrolled. Exacerbating diseases include diabetes and obesity. Pertinent negatives include no chest pain, myalgias or shortness of breath. Current antihyperlipidemic treatment includes ezetimibe. Risk factors for coronary artery disease include dyslipidemia, diabetes mellitus, family history, obesity, hypertension, post-menopausal and a sedentary lifestyle.  Hypertension This is a chronic problem. The problem is uncontrolled. Pertinent negatives include no chest pain, headaches, palpitations  or shortness of breath. Risk factors for coronary artery disease include dyslipidemia, diabetes mellitus, family history, obesity, post-menopausal state, sedentary lifestyle and smoking/tobacco exposure. Past treatments include ACE inhibitors.    Review of systems: Limited as above.   Objective:    There were no vitals taken for this visit.  Wt Readings from Last 3 Encounters:  10/30/19 227 lb (103 kg)  07/11/19 226 lb (102.5 kg)  04/06/19 204 lb (92.5 kg)      CMP ( most recent) CMP     Component Value Date/Time   NA 138 01/07/2020 0913   K 4.1 01/07/2020 0913   CL 102 01/07/2020 0913   CO2 24 01/07/2020 0913   GLUCOSE 198 (H) 01/07/2020 0913   BUN 18 01/07/2020 0913   CREATININE 1.56 (H) 01/07/2020 0913   CALCIUM 10.1 01/07/2020 0913   PROT 8.1 01/07/2020 0913   ALBUMIN 4.2 06/24/2017 1239   AST 20 01/07/2020 0913   ALT 18 01/07/2020 0913   ALKPHOS 105 06/24/2017 1239   BILITOT 0.4 01/07/2020 0913   GFRNONAA 69 07/13/2019 0816   GFRAA 80 07/13/2019 0816     Diabetic Labs (most recent): Lab Results  Component Value Date   HGBA1C 7.1 (H) 01/07/2020   HGBA1C 6.9 (H) 07/13/2019   HGBA1C 10.5 (H) 10/03/2018     Lipid Panel ( most recent) Lipid Panel     Component Value Date/Time   CHOL 267 (H) 07/13/2019 0816   TRIG 295 (H) 07/13/2019 0816   HDL 53 07/13/2019 0816   CHOLHDL 5.0 (H) 07/13/2019 0816   VLDL 37 06/24/2017 1239   LDLCALC 169 (H) 07/13/2019 0816      Lab Results  Component Value Date   TSH 3.56 03/27/2019   TSH 1.80 01/04/2018   TSH 2.75 05/12/2016   TSH 1.915 07/05/2013   TSH 0.829 06/25/2011   TSH 1.159 09/28/2010   TSH 1.824 03/26/2009       Assessment & Plan:   1. Type 2 diabetes mellitus with stage 2 chronic kidney disease, with long-term current use of insulin (HCC)  - Leslie Lyons has currently uncontrolled symptomatic type 2 DM since  59 years of age. -She reports improvement in her glycemic  profile and A1c of 7.1%,  improving from 10.5% at - I had a long discussion with her about the progressive nature of diabetes and the pathology behind its complications. -her diabetes is complicated by renal insufficiency, obesity/sedentary life and she remains at a high risk for more acute and chronic complications which include CAD, CVA, CKD, retinopathy, and neuropathy. These are all discussed in detail with her.  - I have counseled her on diet management and weight loss, by adopting a carbohydrate restricted/protein rich diet. - she  admits there is a room for improvement in her diet and drink choices. -  Suggestion is made for her to avoid simple carbohydrates  from her diet including Cakes, Sweet Desserts / Pastries, Ice Cream, Soda (diet and regular), Sweet Tea, Candies, Chips, Cookies, Sweet Pastries,  Store Bought Juices, Alcohol in Excess of  1-2 drinks a day, Artificial Sweeteners, Coffee Creamer, and "Sugar-free" Products. This will help patient to have stable blood glucose profile and potentially avoid unintended weight gain.  - I encouraged her to switch to  unprocessed or minimally processed complex starch and increased protein intake (animal or plant source), fruits, and vegetables.  - she is advised to stick to a routine mealtimes to eat 3 meals  a day and avoid unnecessary snacks ( to snack only to correct hypoglycemia).   - I have approached her with the following individualized plan to manage diabetes and patient agrees:   -Based on her presentation with continued improvement in her glycemic profile, she will not need prandial insulin for now.   -She is advised to continue  Lantus  25 units nightly,  associated with strict monitoring of glucose 2 times a day-before breakfast and at bedtime.  - she is warned not to take insulin without proper monitoring per orders.  - she is encouraged to call clinic for blood glucose levels less than 70 or above 300 mg /dl. - she is advised to continue glipizide 10 mg  XL daily at breakfast, therapeutically suitable for patient .  - she is not a candidate for metformin, SGLT2 inhibitors due to concurrent renal insufficiency.  -She has several comorbidities including cervical cancer, history of significant smoking with COPD/bronchial asthma, not suitable candidate for incretin therapy.  - Patient specific target  A1c;  LDL, HDL, Triglycerides, and  Waist Circumference were discussed in detail.  2) Blood Pressure /Hypertension:  she is advised to home monitor blood pressure and report if > 140/90 on 2 separate readings.   she is advised to continue her current medications including lisinopril 10 mg p.o. daily with breakfast . 3) Lipids/Hyperlipidemia:   Review of her recent lipid panel showed uncontrolled  LDL at 143.  she  is advised to continue Zetia 10 mg p.o. daily at bedtime.  Side effects and precautions discussed with her.  Will be considered for statin therapy if no contraindications on next visit.  4)  Weight/Diet: Her BMI is 40- clearly complicating her diabetes care.  She is a candidate for modest weight loss.  I discussed with her the fact that loss of 5 - 10% of her  current body weight will have the most impact on her diabetes management.  CDE has been initiated . Exercise, and detailed carbohydrates information provided  -  detailed on discharge instructions.  5) Chronic Care/Health Maintenance:  -she  is on ACEI/ARB and  is encouraged to initiate and continue to follow up with Ophthalmology, Dentist,  Podiatrist at least yearly or according to recommendations,  and advised to  stay away from smoking. I have recommended yearly flu vaccine and pneumonia vaccine at least every 5 years; moderate intensity exercise for up to 150 minutes weekly; and  sleep for at least 7 hours a day.  - she is  advised to maintain close follow up with [name], MD for primary care needs, as well as her other providers for optimal and coordinated care.  - Time  spent on this patient care encounter:  35 min, of which >50% was spent in  counseling and the rest reviewing her  current and  previous labs/studies ( including abstraction from other facilities),  previous treatments, her blood glucose readings, and medications' doses and developing a plan for long-term care based on the latest recommendations for standards of care; and documenting her care.  Leslie Lyons participated in the discussions, expressed understanding, and voiced agreement with the above plans.  All questions were answered to her satisfaction. she is encouraged to contact clinic should she have any questions or concerns prior to her return visit.   Follow up plan: - Return in about 4 months (around 05/13/2020) for Bring Meter and Logs- A1c in Office.  Glade Lloyd, MD [facility] [address] [place], [place] [address] Phone: [phone]  Fax: [phone]    01/14/2020, 2:20 PM  This note was partially dictated with voice recognition software. Similar sounding words can be transcribed inadequately or may not  be corrected upon review.

## 2020-01-29 ENCOUNTER — Encounter: Payer: Self-pay | Admitting: Gastroenterology

## 2020-02-23 ENCOUNTER — Telehealth: Payer: Self-pay

## 2020-02-23 NOTE — Telephone Encounter (Signed)
Pt had appt next week and does not want to see another doctor.  Can you have Dr Chauncey Cruel refill her Xanax.  I have made her appt for Sept.

## 2020-02-24 ENCOUNTER — Other Ambulatory Visit: Payer: Self-pay | Admitting: Family Medicine

## 2020-02-24 NOTE — Telephone Encounter (Signed)
Needs UDS  early this week to ensure she is compliant, xanax not due till 04/05/so we have 1 week to get this done, please work on this. Thanks

## 2020-02-25 NOTE — Telephone Encounter (Signed)
Pt notified stated she would come by this week to leave urine

## 2020-02-28 ENCOUNTER — Ambulatory Visit: Payer: Medicare Other | Admitting: Family Medicine

## 2020-03-04 ENCOUNTER — Other Ambulatory Visit: Payer: Self-pay | Admitting: Family Medicine

## 2020-03-04 ENCOUNTER — Encounter: Payer: Self-pay | Admitting: Gastroenterology

## 2020-03-04 ENCOUNTER — Telehealth: Payer: Self-pay | Admitting: Gastroenterology

## 2020-03-04 ENCOUNTER — Ambulatory Visit: Payer: Medicare Other | Admitting: Gastroenterology

## 2020-03-04 NOTE — Telephone Encounter (Signed)
PATIENT WAS A NO SHOW AND LETTER SENT  °

## 2020-03-04 NOTE — Telephone Encounter (Signed)
Requesting refill

## 2020-03-19 ENCOUNTER — Other Ambulatory Visit: Payer: Self-pay | Admitting: *Deleted

## 2020-03-19 ENCOUNTER — Telehealth: Payer: Self-pay

## 2020-03-19 ENCOUNTER — Other Ambulatory Visit: Payer: Self-pay | Admitting: Family Medicine

## 2020-03-19 DIAGNOSIS — R52 Pain, unspecified: Secondary | ICD-10-CM

## 2020-03-19 MED ORDER — ALPRAZOLAM 1 MG PO TABS: 1.0000 mg | ORAL_TABLET | Freq: Two times a day (BID) | ORAL | 0 refills | Status: AC

## 2020-03-19 NOTE — Telephone Encounter (Signed)
Patient was notified.

## 2020-03-19 NOTE — Telephone Encounter (Signed)
Leslie Lyons came and left a urine for UDS and  Her nerves is very bad and she has been out since the last of March and it will take a week to get the urine back and wants to know if a temp supply can be called in because she can't take her nerves being this bad. Please advise

## 2020-03-19 NOTE — Telephone Encounter (Signed)
10 day supply is prescribed

## 2020-03-20 ENCOUNTER — Other Ambulatory Visit (HOSPITAL_COMMUNITY)
Admission: RE | Admit: 2020-03-20 | Discharge: 2020-03-20 | Disposition: A | Discharge status: Home / Self Care | Payer: Medicare Other | Source: Other Acute Inpatient Hospital | Attending: Family Medicine | Admitting: Family Medicine

## 2020-03-20 DIAGNOSIS — R52 Pain, unspecified: Secondary | ICD-10-CM | POA: Insufficient documentation

## 2020-03-24 LAB — MISC LABCORP TEST (SEND OUT): Labcorp test code: 738526

## 2020-04-04 ENCOUNTER — Other Ambulatory Visit: Payer: Self-pay | Admitting: Family Medicine

## 2020-04-07 ENCOUNTER — Other Ambulatory Visit: Payer: Self-pay

## 2020-04-07 VITALS — BP 148/98 | Ht 63.0 in | Wt 227.0 lb

## 2020-04-07 NOTE — Progress Notes (Deleted)
Subjective:   Leslie Lyons is a 59 y.o. female who presents for Medicare Annual (Subsequent) preventive examination.  Review of Systems:  ***       Objective:     Vitals: BP (!) 148/98   Ht _0  (1.6 m)   Wt 227 lb (103 kg)   BMI 40.21 kg/m   Body mass index is 40.21 kg/m.  No flowsheet data found.  Tobacco Social History   Tobacco Use  Smoking Status Former Smoker  . Packs/day: 0.25  . Years: 2.00  . Pack years: 0.50  . Types: Cigarettes  . Quit date: 12/04/2015  . Years since quitting: 4.3  Smokeless Tobacco Never Used     Counseling given: Not Answered   Clinical Intake:                       Past Medical History:  Diagnosis Date  . Abnormal transaminases   . Anxiety   . Bronchial asthma    with acute exacerbation  . Cancer ([place]) 1980   cervical, diag  at age 38  . Depression   . Diabetes ([place])   . Headache(784.0)    with reduced vision in right eye for one month   . Hyperlipidemia   . Hypertension   . Obesity    Past Surgical History:  Procedure Laterality Date  . ABDOMINAL HYSTERECTOMY    . COLONOSCOPY WITH ESOPHAGOGASTRODUODENOSCOPY (EGD)  11/2008   Dr. Arnoldo Morale: Conscious sedation.  Hiatal hernia.  Hemorrhoids. PATIENT REPORTS BEING AWAKE DURING PROCEDURE  . CRYOTHERAPY     for abnormal pap  . HEMORRHOID SURGERY     dr. Irving Shows  . PARTIAL HYSTERECTOMY     Family History  Problem Relation Age of Onset  . Cancer Mother        lung   . COPD Father        emphysema  . Hypertension Father   . Cancer Maternal Aunt        breast  . Cancer Paternal Uncle   . Colon cancer Neg Hx    Social History   Socioeconomic History  . Marital status: Married    Spouse name: Not on file  . Number of children: 2  . Years of education: Not on file  . Highest education level: Not on file  Occupational History  . Occupation: homemaker   Tobacco Use  . Smoking status: Former Smoker    Packs/day: 0.25    Years: 2.00   Pack years: 0.50    Types: Cigarettes    Quit date: 12/04/2015    Years since quitting: 4.3  . Smokeless tobacco: Never Used  Substance and Sexual Activity  . Alcohol use: No  . Drug use: No  . Sexual activity: Not Currently  Other Topics Concern  . Not on file  Social History Narrative  . Not on file   Social Determinants of Health   Financial Resource Strain:   . Difficulty of Paying Living Expenses:   Food Insecurity:   . Worried About Charity fundraiser in the Last Year:   . Arboriculturist in the Last Year:   Transportation Needs:   . Film/video editor (Medical):   Marland Kitchen Lack of Transportation (Non-Medical):   Physical Activity:   . Days of Exercise per Week:   . Minutes of Exercise per Session:   Stress:   . Feeling of Stress :   Social Connections:   .  Frequency of Communication with Friends and Family:   . Frequency of Social Gatherings with Friends and Family:   . Attends Religious Services:   . Active Member of Clubs or Organizations:   . Attends Archivist Meetings:   Marland Kitchen Marital Status:     Outpatient Encounter Medications as of 04/07/2020  Medication Sig  . ACCU-CHEK FASTCLIX LANCETS MISC Twice daily testing dx e11.9  . albuterol (PROAIR HFA) 108 (90 Base) MCG/ACT inhaler Inhale 2 puffs into the lungs every 6 (six) hours as needed.  . ALPRAZolam (XANAX) 1 MG tablet Take one tablet by mouth two times daily for anxiety  . atorvastatin (LIPITOR) 20 MG tablet Take 1 tablet (20 mg total) by mouth daily.  . blood glucose meter kit and supplies Dispense based on patient and insurance preference. Once daily testing dx e11.9  . Cholecalciferol (VITAMIN D3) 125 MCG (5000 UT) CAPS Take 1 capsule (5,000 Units total) by mouth daily.  . DULoxetine (CYMBALTA) 60 MG capsule Take 60 mg by mouth daily.  Marland Kitchen ezetimibe (ZETIA) 10 MG tablet Take 1 tablet (10 mg total) by mouth daily. (Patient taking differently: Take 10 mg by mouth daily. )  . glipiZIDE (GLUCOTROL XL) 10  MG 24 hr tablet Take 1 tablet (10 mg total) by mouth daily with breakfast.  . glucose blood (ACCU-CHEK GUIDE) test strip Use as instructed twice daily dx e11.9  . Insulin Glargine (LANTUS SOLOSTAR) 100 UNIT/ML Solostar Pen Inject 25 Units into the skin at bedtime.  Marland Kitchen lisinopril (ZESTRIL) 10 MG tablet Take 1 tablet (10 mg total) by mouth daily.  . mometasone (NASONEX) 50 MCG/ACT nasal spray Place 2 sprays into the nose daily.  . mometasone-formoterol (DULERA) 200-5 MCG/ACT AERO Inhale 2 puffs into the lungs 2 (two) times daily.  Marland Kitchen tolterodine (DETROL LA) 2 MG 24 hr capsule Take 1 capsule (2 mg total) by mouth daily. (Patient taking differently: Take 2 mg by mouth daily. urination)  . topiramate (TOPAMAX) 100 MG tablet Take 100 mg by mouth 2 (two) times daily.   No facility-administered encounter medications on file as of 04/07/2020.    Activities of Daily Living No flowsheet data found.  Patient Care Team: [name], MD as PCP - General (Family Medicine) Madelin Headings, DO (Optometry) Danie Binder, MD as Consulting Physician (Gastroenterology)    Assessment:   This is a routine wellness examination for Leslie Lyons.  Exercise Activities and Dietary recommendations    Goals   None     Fall Risk Fall Risk  10/30/2019 08/28/2019 07/11/2019 04/06/2019 03/21/2019  Falls in the past year? _0 Number falls in past yr: _1 Injury with Fall? 0 1 0 1 0  Risk Factor Category  - - - - -  Risk for fall due to : - - - - -  Follow up - - - - -   Is the patient's home free of loose throw rugs in walkways, pet beds, electrical cords, etc?   {Blank single:19197::"yes","no"}      Grab bars in the bathroom? {Blank single:19197::"yes","no"}      Handrails on the stairs?   {Blank single:19197::"yes","no"}      Adequate lighting?   {Blank single:19197::"yes","no"}  Timed Get Up and Go performed: ***  Depression Screen PHQ 2/9 Scores 07/11/2019 07/11/2019 04/06/2019 10/03/2018  PHQ - 2  Score 5 5 0 6  PHQ- 9 Score 19 24 - 25     Cognitive  Function     6CIT Screen 04/06/2019  What Year? 0 points  What month? 0 points  What time? 0 points  Count back from 20 0 points  Months in reverse 0 points  Repeat phrase 2 points  Total Score 2    Immunization History  Administered Date(s) Administered  . Influenza Whole 09/25/2007, 11/04/2009, 09/22/2010  . Influenza,inj,Quad PF,6+ Mos 08/09/2013, 07/22/2014, 11/17/2015, 01/04/2018, 10/03/2018, 08/28/2019  . Pneumococcal Conjugate-13 04/25/2018  . Pneumococcal Polysaccharide-23 07/31/2009, 07/05/2016  . Td 03/26/2009    Qualifies for Shingles Vaccine?***  Screening Tests Health Maintenance  Topic Date Due  . OPHTHALMOLOGY EXAM  Never done  . COVID-19 Vaccine (1) Never done  . COLONOSCOPY  12/17/2018  . TETANUS/TDAP  11/26/2020 (Originally 03/27/2019)  . INFLUENZA VACCINE  06/29/2020  . HEMOGLOBIN A1C  07/06/2020  . FOOT EXAM  08/28/2020  . MAMMOGRAM  12/09/2021  . PAP SMEAR-Modifier  10/29/2022  . PNEUMOCOCCAL POLYSACCHARIDE VACCINE AGE 52-64 HIGH RISK  Completed  . Hepatitis C Screening  Completed  . HIV Screening  Completed    Cancer Screenings: Lung: Low Dose CT Chest recommended if Age 82-80 years, 30 pack-year currently smoking OR have quit w/in 15years. Patient {DOES NOT does:27190::"does not"} qualify. Breast:  Up to date on Mammogram? {Yes/No:30480221}   Up to date of Bone Density/Dexa? {Yes/No:30480221} Colorectal: ***  Additional Screenings: ***: Hepatitis C Screening:      Plan:   ***   I have personally reviewed and noted the following in the patient's chart:   . Medical and social history . Use of alcohol, tobacco or illicit drugs  . Current medications and supplements . Functional ability and status . Nutritional status . Physical activity . Advanced directives . List of other physicians . Hospitalizations, surgeries, and ER visits in previous 12 months . Vitals . Screenings to  include cognitive, depression, and falls . Referrals and appointments  In addition, I have reviewed and discussed with patient certain preventive protocols, quality metrics, and best practice recommendations. A written personalized care plan for preventive services as well as general preventive health recommendations were provided to patient.     Kate Sable, LPN, LPN  [id number]

## 2020-04-09 ENCOUNTER — Ambulatory Visit: Payer: Medicare Other | Admitting: Family Medicine

## 2020-04-10 ENCOUNTER — Telehealth: Payer: Self-pay | Admitting: Family Medicine

## 2020-04-10 ENCOUNTER — Other Ambulatory Visit: Payer: Self-pay | Admitting: Family Medicine

## 2020-04-10 NOTE — Telephone Encounter (Signed)
Noted, thanks!

## 2020-04-18 ENCOUNTER — Ambulatory Visit: Payer: Medicare Other | Admitting: Family Medicine

## 2020-04-22 ENCOUNTER — Inpatient Hospital Stay (HOSPITAL_COMMUNITY)
Admission: EM | Admit: 2020-04-22 | Discharge: 2020-04-28 | DRG: 641 | Disposition: A | Discharge status: Other Acute Inpatient Hospital | Payer: Medicare Other | Attending: Internal Medicine | Admitting: Internal Medicine

## 2020-04-22 ENCOUNTER — Encounter (HOSPITAL_COMMUNITY): Payer: Self-pay

## 2020-04-22 ENCOUNTER — Emergency Department (HOSPITAL_COMMUNITY): Payer: Medicare Other

## 2020-04-22 ENCOUNTER — Other Ambulatory Visit: Payer: Self-pay

## 2020-04-22 DIAGNOSIS — F29 Unspecified psychosis not due to a substance or known physiological condition: Secondary | ICD-10-CM | POA: Diagnosis present

## 2020-04-22 DIAGNOSIS — E782 Mixed hyperlipidemia: Secondary | ICD-10-CM | POA: Diagnosis present

## 2020-04-22 DIAGNOSIS — F321 Major depressive disorder, single episode, moderate: Secondary | ICD-10-CM | POA: Diagnosis present

## 2020-04-22 DIAGNOSIS — Z20822 Contact with and (suspected) exposure to covid-19: Secondary | ICD-10-CM | POA: Diagnosis present

## 2020-04-22 DIAGNOSIS — I959 Hypotension, unspecified: Secondary | ICD-10-CM | POA: Diagnosis not present

## 2020-04-22 DIAGNOSIS — J45909 Unspecified asthma, uncomplicated: Secondary | ICD-10-CM | POA: Diagnosis present

## 2020-04-22 DIAGNOSIS — R509 Fever, unspecified: Secondary | ICD-10-CM

## 2020-04-22 DIAGNOSIS — Z9114 Patient's other noncompliance with medication regimen: Secondary | ICD-10-CM

## 2020-04-22 DIAGNOSIS — E1122 Type 2 diabetes mellitus with diabetic chronic kidney disease: Secondary | ICD-10-CM | POA: Diagnosis not present

## 2020-04-22 DIAGNOSIS — Z79899 Other long term (current) drug therapy: Secondary | ICD-10-CM

## 2020-04-22 DIAGNOSIS — E781 Pure hyperglyceridemia: Secondary | ICD-10-CM | POA: Diagnosis present

## 2020-04-22 DIAGNOSIS — Z6839 Body mass index (BMI) 39.0-39.9, adult: Secondary | ICD-10-CM

## 2020-04-22 DIAGNOSIS — N182 Chronic kidney disease, stage 2 (mild): Secondary | ICD-10-CM | POA: Diagnosis present

## 2020-04-22 DIAGNOSIS — I129 Hypertensive chronic kidney disease with stage 1 through stage 4 chronic kidney disease, or unspecified chronic kidney disease: Secondary | ICD-10-CM | POA: Diagnosis not present

## 2020-04-22 DIAGNOSIS — I10 Essential (primary) hypertension: Secondary | ICD-10-CM | POA: Diagnosis not present

## 2020-04-22 DIAGNOSIS — Z794 Long term (current) use of insulin: Secondary | ICD-10-CM

## 2020-04-22 DIAGNOSIS — Z91128 Patient's intentional underdosing of medication regimen for other reason: Secondary | ICD-10-CM

## 2020-04-22 DIAGNOSIS — F23 Brief psychotic disorder: Secondary | ICD-10-CM | POA: Diagnosis present

## 2020-04-22 DIAGNOSIS — E876 Hypokalemia: Secondary | ICD-10-CM | POA: Diagnosis not present

## 2020-04-22 DIAGNOSIS — Z8541 Personal history of malignant neoplasm of cervix uteri: Secondary | ICD-10-CM

## 2020-04-22 DIAGNOSIS — Z781 Physical restraint status: Secondary | ICD-10-CM

## 2020-04-22 DIAGNOSIS — T50916A Underdosing of multiple unspecified drugs, medicaments and biological substances, initial encounter: Secondary | ICD-10-CM | POA: Diagnosis present

## 2020-04-22 DIAGNOSIS — K7581 Nonalcoholic steatohepatitis (NASH): Secondary | ICD-10-CM | POA: Diagnosis present

## 2020-04-22 DIAGNOSIS — Z87891 Personal history of nicotine dependence: Secondary | ICD-10-CM

## 2020-04-22 DIAGNOSIS — Z888 Allergy status to other drugs, medicaments and biological substances status: Secondary | ICD-10-CM

## 2020-04-22 DIAGNOSIS — F419 Anxiety disorder, unspecified: Secondary | ICD-10-CM | POA: Diagnosis present

## 2020-04-22 DIAGNOSIS — E559 Vitamin D deficiency, unspecified: Secondary | ICD-10-CM | POA: Diagnosis present

## 2020-04-22 DIAGNOSIS — Z03818 Encounter for observation for suspected exposure to other biological agents ruled out: Secondary | ICD-10-CM | POA: Diagnosis not present

## 2020-04-22 DIAGNOSIS — Y92009 Unspecified place in unspecified non-institutional (private) residence as the place of occurrence of the external cause: Secondary | ICD-10-CM

## 2020-04-22 DIAGNOSIS — E785 Hyperlipidemia, unspecified: Secondary | ICD-10-CM | POA: Diagnosis present

## 2020-04-22 DIAGNOSIS — Z8249 Family history of ischemic heart disease and other diseases of the circulatory system: Secondary | ICD-10-CM

## 2020-04-22 LAB — COMPREHENSIVE METABOLIC PANEL
ALT: 33 U/L (ref 0–44)
AST: 48 U/L — ABNORMAL HIGH (ref 15–41)
Albumin: 4.9 g/dL (ref 3.5–5.0)
Alkaline Phosphatase: 62 U/L (ref 38–126)
Anion gap: 18 — ABNORMAL HIGH (ref 5–15)
BUN: 25 mg/dL — ABNORMAL HIGH (ref 6–20)
CO2: 25 mmol/L (ref 22–32)
Calcium: 9.3 mg/dL (ref 8.9–10.3)
Chloride: 95 mmol/L — ABNORMAL LOW (ref 98–111)
Creatinine, Ser: 1.2 mg/dL — ABNORMAL HIGH (ref 0.44–1.00)
GFR calc Af Amer: 57 mL/min — ABNORMAL LOW (ref >60)
GFR calc non Af Amer: 49 mL/min — ABNORMAL LOW (ref >60)
Glucose, Bld: 125 mg/dL — ABNORMAL HIGH (ref 70–99)
Potassium: 2.2 mmol/L — CL (ref 3.5–5.1)
Sodium: 138 mmol/L (ref 135–145)
Total Bilirubin: 1.3 mg/dL — ABNORMAL HIGH (ref 0.3–1.2)
Total Protein: 8.4 g/dL — ABNORMAL HIGH (ref 6.5–8.1)

## 2020-04-22 LAB — CBC WITH DIFFERENTIAL/PLATELET
Abs Immature Granulocytes: 0.03 10*3/uL (ref 0.00–0.07)
Basophils Absolute: 0.1 10*3/uL (ref 0.0–0.1)
Basophils Relative: 1 %
Eosinophils Absolute: 0 10*3/uL (ref 0.0–0.5)
Eosinophils Relative: 0 %
HCT: 37 % (ref 36.0–46.0)
Hemoglobin: 12 g/dL (ref 12.0–15.0)
Immature Granulocytes: 0 %
Lymphocytes Relative: 40 %
Lymphs Abs: 4.6 10*3/uL — ABNORMAL HIGH (ref 0.7–4.0)
MCH: 28.5 pg (ref 26.0–34.0)
MCHC: 32.4 g/dL (ref 30.0–36.0)
MCV: 87.9 fL (ref 80.0–100.0)
Monocytes Absolute: 1 10*3/uL (ref 0.1–1.0)
Monocytes Relative: 9 %
Neutro Abs: 5.7 10*3/uL (ref 1.7–7.7)
Neutrophils Relative %: 50 %
Platelets: 164 10*3/uL (ref 150–400)
RBC: 4.21 MIL/uL (ref 3.87–5.11)
RDW: 14.3 % (ref 11.5–15.5)
WBC: 11.5 10*3/uL — ABNORMAL HIGH (ref 4.0–10.5)
nRBC: 0 % (ref 0.0–0.2)

## 2020-04-22 LAB — TSH: TSH: 0.869 u[IU]/mL (ref 0.350–4.500)

## 2020-04-22 LAB — CBG MONITORING, ED: Glucose-Capillary: 144 mg/dL — ABNORMAL HIGH (ref 70–99)

## 2020-04-22 LAB — ETHANOL: Alcohol, Ethyl (B): <10 mg/dL (ref <10)

## 2020-04-22 LAB — SARS CORONAVIRUS 2 BY RT PCR (HOSPITAL ORDER, PERFORMED IN ~~LOC~~ HOSPITAL LAB): SARS Coronavirus 2: NEGATIVE

## 2020-04-22 MED ORDER — LORAZEPAM 2 MG/ML IJ SOLN
1.0000 mg | INTRAMUSCULAR | Status: DC | PRN
  Administered 2020-04-22 – 2020-04-23 (×2): 1 mg via INTRAVENOUS
  Filled 2020-04-22 (×2): qty 1

## 2020-04-22 MED ORDER — POTASSIUM CHLORIDE CRYS ER 20 MEQ PO TBCR
40.0000 meq | EXTENDED_RELEASE_TABLET | Freq: Once | ORAL | Status: AC
  Administered 2020-04-22: 40 meq via ORAL
  Filled 2020-04-22: qty 2

## 2020-04-22 MED ORDER — LORAZEPAM 2 MG/ML IJ SOLN
1.0000 mg | Freq: Once | INTRAMUSCULAR | Status: AC
  Administered 2020-04-22: 1 mg via INTRAVENOUS
  Filled 2020-04-22: qty 1

## 2020-04-22 MED ORDER — MAGNESIUM OXIDE 400 (241.3 MG) MG PO TABS
400.0000 mg | ORAL_TABLET | Freq: Once | ORAL | Status: DC
  Filled 2020-04-22: qty 1

## 2020-04-22 MED ORDER — POTASSIUM CHLORIDE 10 MEQ/100ML IV SOLN
10.0000 meq | Freq: Once | INTRAVENOUS | Status: AC
  Administered 2020-04-22: 10 meq via INTRAVENOUS
  Filled 2020-04-22: qty 100

## 2020-04-22 MED ORDER — POTASSIUM CHLORIDE IN NACL 40-0.9 MEQ/L-% IV SOLN
INTRAVENOUS | Status: AC
  Administered 2020-04-22: 250 mL/h via INTRAVENOUS
  Filled 2020-04-22 (×3): qty 1000

## 2020-04-22 NOTE — ED Notes (Signed)
While asking pt psych assessment questions, pt states "I don't eat pussy. I am strictly dickly. Only want dick from the [employer]. Nikki you can't give the cop booty in hell"

## 2020-04-22 NOTE — ED Notes (Signed)
CRITICAL VALUE ALERT  Critical Value:  Potassium 2.2  Date & Time Notied:  04/22/20 1701  Provider Notified: Dr. Tomi Bamberger  Orders Received/Actions taken: EDP notified

## 2020-04-22 NOTE — ED Provider Notes (Signed)
[facility] EMERGENCY DEPARTMENT Provider Note   CSN: [id number] Arrival date & time: 04/22/20  1307     History Chief Complaint  Patient presents with  . Altered Mental Status    Leslie Lyons is a 59 y.o. female.  HPI   Patient presents to the emergency room for evaluation of abnormal behavior.  Was brought in by IVC by family members.  According to the paperwork, the patient has been acting bizarrely for at least the last week.  Patient has not been taking her medications.  She has been undressing standing outside of her home naked.  She has been talking to herself and yelling intermittently.  In the ED patient is very hyper religious.  Patient has been saying that her father is the devil.  Patient told medical staff they were going to hell .  Patient keeps performing the sign of the cross and stating I am sure I am pure.  Past Medical History:  Diagnosis Date  . Abnormal transaminases   . Anxiety   . Bronchial asthma    with acute exacerbation  . Cancer ([place]) 1980   cervical, diag  at age 47  . Depression   . Diabetes ([place])   . Headache(784.0)    with reduced vision in right eye for one month   . Hyperlipidemia   . Hypertension   . Obesity     Patient Active Problem List   Diagnosis Date Noted  . Abnormal LFTs 10/31/2018  . Constipation 10/31/2018  . NASH (nonalcoholic steatohepatitis) 10/25/2018  . Contraindication to statin medication 10/25/2018  . Type 2 diabetes mellitus with stage 2 chronic kidney disease, with long-term current use of insulin ([place]) 07/05/2016  . Morbid obesity ([place]) 08/09/2013  . Ovarian cyst, left 07/06/2013  . Headache 07/06/2013  . Hx of cervical cancer 07/06/2013  . Urinary incontinence 11/20/2012  . Back pain, chronic 06/26/2011  . Vitamin D deficiency 09/29/2010  . Mixed hyperlipidemia 09/29/2010  . Other malaise and fatigue 02/27/2010  . DYSPEPSIA, CHRONIC 01/21/2009  . Essential hypertension, benign 12/08/2008  . External  hemorrhoids 12/03/2008  . Asthma in adult 10/06/2008  . Adjustment disorder with mixed anxiety and depressed mood 04/16/2008  . Depression, major, single episode, moderate ([place]) 04/16/2008    Past Surgical History:  Procedure Laterality Date  . ABDOMINAL HYSTERECTOMY    . COLONOSCOPY WITH ESOPHAGOGASTRODUODENOSCOPY (EGD)  11/2008   Dr. Arnoldo Morale: Conscious sedation.  Hiatal hernia.  Hemorrhoids. PATIENT REPORTS BEING AWAKE DURING PROCEDURE  . CRYOTHERAPY     for abnormal pap  . HEMORRHOID SURGERY     dr. Irving Shows  . PARTIAL HYSTERECTOMY       OB History   No obstetric history on file.     Family History  Problem Relation Age of Onset  . Cancer Mother        lung   . COPD Father        emphysema  . Hypertension Father   . Cancer Maternal Aunt        breast  . Cancer Paternal Uncle   . Colon cancer Neg Hx     Social History   Tobacco Use  . Smoking status: Former Smoker    Packs/day: 0.25    Years: 2.00    Pack years: 0.50    Types: Cigarettes    Quit date: 12/04/2015    Years since quitting: 4.3  . Smokeless tobacco: Never Used  Substance Use Topics  . Alcohol use: No  .  Drug use: No    Home Medications Prior to Admission medications   Medication Sig Start Date End Date Taking? Authorizing Provider  albuterol (PROAIR HFA) 108 (90 Base) MCG/ACT inhaler Inhale 2 puffs into the lungs every 6 (six) hours as needed. 06/20/18   [name], MD  ALPRAZolam Duanne Moron) 1 MG tablet Take one tablet by mouth two times daily for anxiety 11/07/19   [name], MD  atorvastatin (LIPITOR) 20 MG tablet Take 1 tablet (20 mg total) by mouth daily. 07/31/19   Perlie Mayo, NP  Cholecalciferol (VITAMIN D3) 125 MCG (5000 UT) CAPS Take 1 capsule (5,000 Units total) by mouth daily. 11/28/18   Cassandria Anger, MD  DULoxetine (CYMBALTA) 60 MG capsule Take 60 mg by mouth daily.    [provider]  ezetimibe (ZETIA) 10 MG tablet Take 1 tablet (10 mg total) by  mouth daily. Patient taking differently: Take 10 mg by mouth daily.  06/20/18   [name], MD  glipiZIDE (GLUCOTROL XL) 10 MG 24 hr tablet Take 1 tablet (10 mg total) by mouth daily with breakfast. 10/30/19   [name], MD  Insulin Glargine (LANTUS SOLOSTAR) 100 UNIT/ML Solostar Pen Inject 25 Units into the skin at bedtime. 12/04/18   Cassandria Anger, MD  lisinopril (ZESTRIL) 10 MG tablet Take 1 tablet (10 mg total) by mouth daily. 10/30/19   [name], MD  mometasone (NASONEX) 50 MCG/ACT nasal spray Place 2 sprays into the nose daily. 06/20/18   [name], MD  mometasone-formoterol (DULERA) 200-5 MCG/ACT AERO Inhale 2 puffs into the lungs 2 (two) times daily. 06/20/18   [name], MD  tolterodine (DETROL LA) 2 MG 24 hr capsule Take 1 capsule (2 mg total) by mouth daily. Patient taking differently: Take 2 mg by mouth daily. urination 06/20/18   [name], MD  topiramate (TOPAMAX) 100 MG tablet Take 100 mg by mouth 2 (two) times daily.    [provider]    Allergies    Janumet [sitagliptin-metformin hcl] and Robaxin [methocarbamol]  Review of Systems   Review of Systems  Unable to perform ROS: Acuity of condition    Physical Exam Updated Vital Signs BP 119/90   Pulse 80   Temp (!) 97.5 F (36.4 C) (Temporal)   Resp 16   Ht 1.6 m (5' 3" )   Wt 102 kg   SpO2 100%   BMI 39.83 kg/m   Physical Exam Vitals and nursing note reviewed.  Constitutional:      General: She is not in acute distress.    Appearance: She is well-developed.  HENT:     Head: Normocephalic and atraumatic.     Right Ear: External ear normal.     Left Ear: External ear normal.  Eyes:     General: No scleral icterus.       Right eye: No discharge.        Left eye: No discharge.     Conjunctiva/sclera: Conjunctivae normal.  Neck:     Trachea: No tracheal deviation.  Cardiovascular:     Rate and Rhythm: Normal rate and regular rhythm.   Pulmonary:     Effort: Pulmonary effort is normal. No respiratory distress.     Breath sounds: Normal breath sounds. No stridor. No wheezing or rales.  Abdominal:     General: Bowel sounds are normal. There is no distension.     Palpations: Abdomen is soft.  Tenderness: There is no abdominal tenderness. There is no guarding or rebound.  Musculoskeletal:        General: No tenderness.     Cervical back: Neck supple.  Skin:    General: Skin is warm and dry.     Findings: No rash.  Neurological:     Mental Status: She is alert.     Cranial Nerves: No cranial nerve deficit (no facial droop, extraocular movements intact, no slurred speech).     Sensory: No sensory deficit.     Motor: No abnormal muscle tone or seizure activity.     Coordination: Coordination normal.  Psychiatric:        Attention and Perception: She perceives auditory hallucinations.        Mood and Affect: Mood is anxious. Affect is labile.        Speech: Speech is tangential.        Behavior: Behavior is agitated and hyperactive.        Cognition and Memory: Cognition is impaired.        Judgment: Judgment is impulsive and inappropriate.     ED Results / Procedures / Treatments   Labs (all labs ordered are listed, but only abnormal results are displayed) Labs Reviewed  COMPREHENSIVE METABOLIC PANEL - Abnormal; Notable for the following components:      Result Value   Potassium 2.2 (*)    Chloride 95 (*)    Glucose, Bld 125 (*)    BUN 25 (*)    Creatinine, Ser 1.20 (*)    Total Protein 8.4 (*)    AST 48 (*)    Total Bilirubin 1.3 (*)    GFR calc non Af Amer 49 (*)    GFR calc Af Amer 57 (*)    Anion gap 18 (*)    All other components within normal limits  CBC WITH DIFFERENTIAL/PLATELET - Abnormal; Notable for the following components:   WBC 11.5 (*)    Lymphs Abs 4.6 (*)    All other components within normal limits  CBG MONITORING, ED - Abnormal; Notable for the following components:    Glucose-Capillary 144 (*)    All other components within normal limits  ETHANOL  TSH  RAPID URINE DRUG SCREEN, HOSP PERFORMED    EKG EKG Interpretation  Date/Time:  Tuesday Apr 22 2020 17:26:20 EDT Ventricular Rate:  91 PR Interval:  132 QRS Duration: 80 QT Interval:  390 QTC Calculation: 479 R Axis:   85 Text Interpretation: Normal sinus rhythm with sinus arrhythmia Normal ECG No significant change since last tracing Confirmed by Dorie Rank [phone]) on 04/22/2020 6:00:23 PM   Radiology No results found.  Procedures .Critical Care Performed by: Dorie Rank, MD Authorized by: Dorie Rank, MD   Critical care provider statement:    Critical care time (minutes):  30   Critical care was time spent personally by me on the following activities:  Discussions with consultants, evaluation of patient's response to treatment, examination of patient, ordering and performing treatments and interventions, ordering and review of laboratory studies, ordering and review of radiographic studies, pulse oximetry, re-evaluation of patient's condition, obtaining history from patient or surrogate and review of old charts   (including critical care time)  Medications Ordered in ED Medications  potassium chloride 10 mEq in 100 mL IVPB (10 mEq Intravenous New Bag/Given 04/22/20 1740)  magnesium oxide (MAG-OX) tablet 400 mg (400 mg Oral Refused 04/22/20 1741)  LORazepam (ATIVAN) injection 1 mg (1 mg Intravenous Given 04/22/20  1738)  potassium chloride SA (KLOR-CON) CR tablet 40 mEq (40 mEq Oral Given 04/22/20 1741)    ED Course  I have reviewed the triage vital signs and the nursing notes.  Pertinent labs & imaging results that were available during my care of the patient were reviewed by me and considered in my medical decision making (see chart for details).  Clinical Course as of Apr 22 1829  Tue Apr 22, 2020  1700 Labs reviewed.  Patient's potassium is down at 2.2.   [JK]  1702 Bilirubin slightly  elevated.  Otherwise creatinine is stable   [JK]  1826 IV and oral potassium ordered.  Magnesium supplement also ordered.   [JK]    Clinical Course User Index [JK] Dorie Rank, MD   MDM Rules/Calculators/A&P                      Patient presented to ED for evaluation of abnormal behavior.  Patient was brought in under IVC.  Patient has been exhibiting bizarre behavior.  She appears delusional, hallucinating and psychotic.  Patient's laboratory tests are notable for significant hypokalemia.  Uncertain as to the etiology.  I do not see any medications listed that would induce hypokalemia.  Pt will require psychiatric evaluation, admission but will need medical stabilization first with her hypokalemia.   IV and oral potassium ordered.  Will consult with medical service.  Final Clinical Impression(s) / ED Diagnoses Final diagnoses:  Hypokalemia  Psychosis, unspecified psychosis type [facility])      Dorie Rank, MD 04/22/20 [phone]

## 2020-04-22 NOTE — ED Triage Notes (Signed)
Pt told this nurse that this nurse is going to hell. Pt referring to this nurse at [facility]. States pt's father is the devil.

## 2020-04-22 NOTE — ED Triage Notes (Signed)
Pt brought to [facility] ER by [employer]. Pt's husband took out IVC paperwork on pt. Pt in triage walking around with hands extended outwards. Pt resistant to staff. Per pt "I know that you are apart of this plan but my spirits are protecting me against you." Pt experiencing flight of ideas and hallucinations. Pt states she is speaking to her master.   Pt hyperactive in triage. Making strange noises.

## 2020-04-22 NOTE — ED Notes (Signed)
Pt's shirt and pants along with yellow colored earrings were placed in bag and put in lockers by NT. While dressing out, pt continuously stated, "I'm pure. I'm pure. Purity."

## 2020-04-23 ENCOUNTER — Observation Stay (HOSPITAL_COMMUNITY): Payer: Medicare Other

## 2020-04-23 DIAGNOSIS — Z20822 Contact with and (suspected) exposure to covid-19: Secondary | ICD-10-CM | POA: Diagnosis present

## 2020-04-23 DIAGNOSIS — T50916A Underdosing of multiple unspecified drugs, medicaments and biological substances, initial encounter: Secondary | ICD-10-CM | POA: Diagnosis present

## 2020-04-23 DIAGNOSIS — Y92009 Unspecified place in unspecified non-institutional (private) residence as the place of occurrence of the external cause: Secondary | ICD-10-CM | POA: Diagnosis not present

## 2020-04-23 DIAGNOSIS — Z6839 Body mass index (BMI) 39.0-39.9, adult: Secondary | ICD-10-CM | POA: Diagnosis not present

## 2020-04-23 DIAGNOSIS — R509 Fever, unspecified: Secondary | ICD-10-CM | POA: Diagnosis not present

## 2020-04-23 DIAGNOSIS — F419 Anxiety disorder, unspecified: Secondary | ICD-10-CM | POA: Diagnosis present

## 2020-04-23 DIAGNOSIS — Z87891 Personal history of nicotine dependence: Secondary | ICD-10-CM | POA: Diagnosis not present

## 2020-04-23 DIAGNOSIS — E1122 Type 2 diabetes mellitus with diabetic chronic kidney disease: Secondary | ICD-10-CM | POA: Diagnosis present

## 2020-04-23 DIAGNOSIS — R4182 Altered mental status, unspecified: Secondary | ICD-10-CM | POA: Diagnosis not present

## 2020-04-23 DIAGNOSIS — K7581 Nonalcoholic steatohepatitis (NASH): Secondary | ICD-10-CM | POA: Diagnosis not present

## 2020-04-23 DIAGNOSIS — E876 Hypokalemia: Principal | ICD-10-CM

## 2020-04-23 DIAGNOSIS — I129 Hypertensive chronic kidney disease with stage 1 through stage 4 chronic kidney disease, or unspecified chronic kidney disease: Secondary | ICD-10-CM | POA: Diagnosis present

## 2020-04-23 DIAGNOSIS — Z8541 Personal history of malignant neoplasm of cervix uteri: Secondary | ICD-10-CM | POA: Diagnosis not present

## 2020-04-23 DIAGNOSIS — Z794 Long term (current) use of insulin: Secondary | ICD-10-CM | POA: Diagnosis not present

## 2020-04-23 DIAGNOSIS — I10 Essential (primary) hypertension: Secondary | ICD-10-CM | POA: Diagnosis not present

## 2020-04-23 DIAGNOSIS — I959 Hypotension, unspecified: Secondary | ICD-10-CM | POA: Diagnosis not present

## 2020-04-23 DIAGNOSIS — J45909 Unspecified asthma, uncomplicated: Secondary | ICD-10-CM | POA: Diagnosis present

## 2020-04-23 DIAGNOSIS — Z781 Physical restraint status: Secondary | ICD-10-CM | POA: Diagnosis not present

## 2020-04-23 DIAGNOSIS — F321 Major depressive disorder, single episode, moderate: Secondary | ICD-10-CM | POA: Diagnosis present

## 2020-04-23 DIAGNOSIS — E559 Vitamin D deficiency, unspecified: Secondary | ICD-10-CM | POA: Diagnosis present

## 2020-04-23 DIAGNOSIS — N182 Chronic kidney disease, stage 2 (mild): Secondary | ICD-10-CM | POA: Diagnosis present

## 2020-04-23 DIAGNOSIS — Z91128 Patient's intentional underdosing of medication regimen for other reason: Secondary | ICD-10-CM | POA: Diagnosis not present

## 2020-04-23 DIAGNOSIS — Z79899 Other long term (current) drug therapy: Secondary | ICD-10-CM | POA: Diagnosis not present

## 2020-04-23 DIAGNOSIS — E782 Mixed hyperlipidemia: Secondary | ICD-10-CM | POA: Diagnosis not present

## 2020-04-23 DIAGNOSIS — F23 Brief psychotic disorder: Secondary | ICD-10-CM | POA: Diagnosis present

## 2020-04-23 DIAGNOSIS — Z9114 Patient's other noncompliance with medication regimen: Secondary | ICD-10-CM | POA: Diagnosis not present

## 2020-04-23 DIAGNOSIS — F29 Unspecified psychosis not due to a substance or known physiological condition: Secondary | ICD-10-CM | POA: Diagnosis not present

## 2020-04-23 LAB — CBC
HCT: 32.7 % — ABNORMAL LOW (ref 36.0–46.0)
Hemoglobin: 10.4 g/dL — ABNORMAL LOW (ref 12.0–15.0)
MCH: 28.5 pg (ref 26.0–34.0)
MCHC: 31.8 g/dL (ref 30.0–36.0)
MCV: 89.6 fL (ref 80.0–100.0)
Platelets: 199 10*3/uL (ref 150–400)
RBC: 3.65 MIL/uL — ABNORMAL LOW (ref 3.87–5.11)
RDW: 14.5 % (ref 11.5–15.5)
WBC: 7.2 10*3/uL (ref 4.0–10.5)
nRBC: 0 % (ref 0.0–0.2)

## 2020-04-23 LAB — COMPREHENSIVE METABOLIC PANEL
ALT: 27 U/L (ref 0–44)
AST: 37 U/L (ref 15–41)
Albumin: 3.6 g/dL (ref 3.5–5.0)
Alkaline Phosphatase: 54 U/L (ref 38–126)
Anion gap: 11 (ref 5–15)
BUN: 19 mg/dL (ref 6–20)
CO2: 26 mmol/L (ref 22–32)
Calcium: 8.4 mg/dL — ABNORMAL LOW (ref 8.9–10.3)
Chloride: 102 mmol/L (ref 98–111)
Creatinine, Ser: 0.96 mg/dL (ref 0.44–1.00)
GFR calc Af Amer: >60 mL/min (ref >60)
GFR calc non Af Amer: >60 mL/min (ref >60)
Glucose, Bld: 151 mg/dL — ABNORMAL HIGH (ref 70–99)
Potassium: 2.8 mmol/L — ABNORMAL LOW (ref 3.5–5.1)
Sodium: 139 mmol/L (ref 135–145)
Total Bilirubin: 1.3 mg/dL — ABNORMAL HIGH (ref 0.3–1.2)
Total Protein: 6.6 g/dL (ref 6.5–8.1)

## 2020-04-23 LAB — URINALYSIS, ROUTINE W REFLEX MICROSCOPIC
Bacteria, UA: NONE SEEN
Bilirubin Urine: NEGATIVE
Glucose, UA: NEGATIVE mg/dL
Ketones, ur: 20 mg/dL — AB
Leukocytes,Ua: NEGATIVE
Nitrite: NEGATIVE
Protein, ur: 30 mg/dL — AB
RBC / HPF: >50 RBC/hpf — ABNORMAL HIGH (ref 0–5)
Specific Gravity, Urine: 1.026 (ref 1.005–1.030)
pH: 5 (ref 5.0–8.0)

## 2020-04-23 LAB — RAPID URINE DRUG SCREEN, HOSP PERFORMED
Amphetamines: NOT DETECTED
Barbiturates: NOT DETECTED
Benzodiazepines: POSITIVE — AB
Cocaine: NOT DETECTED
Opiates: NOT DETECTED
Tetrahydrocannabinol: POSITIVE — AB

## 2020-04-23 LAB — GLUCOSE, CAPILLARY
Glucose-Capillary: 113 mg/dL — ABNORMAL HIGH (ref 70–99)
Glucose-Capillary: 107 mg/dL — ABNORMAL HIGH (ref 70–99)
Glucose-Capillary: 86 mg/dL (ref 70–99)
Glucose-Capillary: 96 mg/dL (ref 70–99)

## 2020-04-23 LAB — HEMOGLOBIN A1C
Hgb A1c MFr Bld: 7 % — ABNORMAL HIGH (ref 4.8–5.6)
Mean Plasma Glucose: 154.2 mg/dL

## 2020-04-23 LAB — PHOSPHORUS: Phosphorus: 2.4 mg/dL — ABNORMAL LOW (ref 2.5–4.6)

## 2020-04-23 LAB — HIV ANTIBODY (ROUTINE TESTING W REFLEX): HIV Screen 4th Generation wRfx: NONREACTIVE

## 2020-04-23 LAB — MAGNESIUM: Magnesium: 1.8 mg/dL (ref 1.7–2.4)

## 2020-04-23 LAB — MRSA PCR SCREENING: MRSA by PCR: NEGATIVE

## 2020-04-23 MED ORDER — LORAZEPAM 2 MG/ML IJ SOLN
1.0000 mg | INTRAMUSCULAR | Status: DC | PRN
  Administered 2020-04-23 – 2020-04-26 (×15): 1 mg via INTRAVENOUS
  Filled 2020-04-23 (×16): qty 1

## 2020-04-23 MED ORDER — POTASSIUM CHLORIDE 10 MEQ/100ML IV SOLN
10.0000 meq | INTRAVENOUS | Status: AC
  Administered 2020-04-23 (×6): 10 meq via INTRAVENOUS
  Filled 2020-04-23 (×6): qty 100

## 2020-04-23 MED ORDER — POTASSIUM CHLORIDE IN NACL 40-0.9 MEQ/L-% IV SOLN
INTRAVENOUS | Status: DC
  Administered 2020-04-23 – 2020-04-25 (×5): 125 mL/h via INTRAVENOUS

## 2020-04-23 MED ORDER — LORAZEPAM 2 MG/ML IJ SOLN
INTRAMUSCULAR | Status: AC
  Filled 2020-04-23: qty 1

## 2020-04-23 MED ORDER — INSULIN ASPART 100 UNIT/ML ~~LOC~~ SOLN
0.0000 [IU] | Freq: Three times a day (TID) | SUBCUTANEOUS | Status: DC
  Administered 2020-04-25 – 2020-04-26 (×3): 3 [IU] via SUBCUTANEOUS
  Administered 2020-04-27: 4 [IU] via SUBCUTANEOUS
  Administered 2020-04-27: 3 [IU] via SUBCUTANEOUS

## 2020-04-23 MED ORDER — ORAL CARE MOUTH RINSE
15.0000 mL | Freq: Two times a day (BID) | OROMUCOSAL | Status: DC
  Administered 2020-04-23 – 2020-04-28 (×7): 15 mL via OROMUCOSAL

## 2020-04-23 MED ORDER — LORAZEPAM 2 MG/ML IJ SOLN
2.0000 mg | Freq: Once | INTRAMUSCULAR | Status: AC
  Administered 2020-04-23: 2 mg via INTRAVENOUS

## 2020-04-23 MED ORDER — ACETAMINOPHEN 325 MG PO TABS
650.0000 mg | ORAL_TABLET | Freq: Four times a day (QID) | ORAL | Status: DC | PRN
  Administered 2020-04-25: 650 mg via ORAL
  Filled 2020-04-23: qty 2

## 2020-04-23 MED ORDER — ACETAMINOPHEN 650 MG RE SUPP: 650.0000 mg | Freq: Four times a day (QID) | RECTAL | Status: DC | PRN

## 2020-04-23 MED ORDER — CHLORHEXIDINE GLUCONATE CLOTH 2 % EX PADS
6.0000 | MEDICATED_PAD | Freq: Every day | CUTANEOUS | Status: DC
  Administered 2020-04-23 – 2020-04-28 (×6): 6 via TOPICAL

## 2020-04-23 MED ORDER — ENOXAPARIN SODIUM 40 MG/0.4ML ~~LOC~~ SOLN
40.0000 mg | SUBCUTANEOUS | Status: DC
  Administered 2020-04-23 – 2020-04-28 (×4): 40 mg via SUBCUTANEOUS
  Filled 2020-04-23 (×4): qty 0.4

## 2020-04-23 NOTE — ED Notes (Signed)
Talbot Grumbling, NP, patient meets inpatient criteria. [facility] at capacity. TTS to secure placement.

## 2020-04-23 NOTE — Progress Notes (Signed)
Pt in bilateral soft wrist restraints. She's sitting straight up in the bed, rolling her head around, rolling her eyes, yelling "pluto" at staff, rocking back and forth, and looking behind her winking and smiling and something that isn't there.

## 2020-04-23 NOTE — Progress Notes (Signed)
PROGRESS NOTE    Leslie Lyons  PZW:[id number] DOB: 01-14-1961 DOA: 04/22/2020 PCP: [name], MD    Brief Narrative:  59 year old female admitted to the hospital with paranoia/delusions and bizarre behavior which is progressively getting worse over the past several weeks.  She was evaluated in the emergency room and found to be significantly hypokalemic and was therefore referred for medical admission.  Electrolytes are being replaced.  Behavioral health input has been requested.  Patient was admitted under involuntary commitment    Assessment & Plan:   Principal Problem:   Hypokalemia Active Problems:   Mixed hyperlipidemia   Essential hypertension, benign   Type 2 diabetes mellitus with stage 2 chronic kidney disease, with long-term current use of insulin (HCC)   NASH (nonalcoholic steatohepatitis)   Psychosis ([place])   1. Acute psychosis.  Unclear etiology.  Husband reports that she has been having delusions/hallucination intermittently for the past several weeks.  These have gotten progressively worse.  CT head on admission was noted to be unremarkable.  Urine tox screen positive for cannabis as well as benzodiazepines.  Urinalysis does not show any signs of infection.  She did not have any obvious sources of infection.  TSH is normal.  Ethanol level is undetectable.  Suspect that psychiatric illnesses main cause of psychosis.  She is currently on involuntary commitment.  She has required intermittent doses of Ativan for agitation.  We will request behavioral health input to help determine discharge disposition as well as medication management. 2. Hypokalemia.  She is not on any diuretics.  Potassium level is improving with replacement.  Anticipate this should normalize by tomorrow.  Magnesium level 1.8. 3. Diabetes.  Blood sugars currently stable.  Continue on sliding scale insulin. 4. Hypertension.  Resume lisinopril on discharge.   DVT prophylaxis: Lovenox Code  Status: Full code Family Communication: Discussed with husband and sister at the bedside Disposition Plan: Status is: Inpatient  Remains inpatient appropriate because:Altered mental status.  Mental status has not returned to baseline.   Dispo: The patient is from: Home              Anticipated d/c is to: To be determined by psychiatry              Anticipated d/c date is: 1 day              Patient currently is not medically stable to d/c.  Anticipate that she should be medically stable for discharge by tomorrow.    Consultants:     Procedures:     Antimicrobials:       Subjective: Patient continues to require restraints.  She is remained agitated and confused/delusional overnight.  Objective: Vitals:   04/23/20 0734 04/23/20 1126 04/23/20 1200 04/23/20 1644  BP:      Pulse:      Resp:  (!) 25 (!) 28 (!) 24  Temp: 98.3 F (36.8 C) 99.9 F (37.7 C)  98.4 F (36.9 C)  TempSrc: Axillary Oral  Axillary  SpO2:      Weight:      Height:        Intake/Output Summary (Last 24 hours) at 04/23/2020 1951 Last data filed at 04/23/2020 1856 Gross per 24 hour  Intake 2850.6 ml  Output --  Net 2850.6 ml   Filed Weights   04/22/20 1347 04/22/20 2153 04/23/20 0500  Weight: 102 kg 91.9 kg 91.9 kg    Examination:  General exam: Appears calm and comfortable  Respiratory  system: Clear to auscultation. Respiratory effort normal. Cardiovascular system: S1 & S2 heard, RRR. No JVD, murmurs, rubs, gallops or clicks. No pedal edema. Gastrointestinal system: Abdomen is nondistended, soft and nontender. No organomegaly or masses felt. Normal bowel sounds heard. Central nervous system:  No focal neurological deficits. Extremities: Symmetric 5 x 5 power. Skin: No rashes, lesions or ulcers Psychiatry: Speech is tangential, pressured.  Patient is confused    Data Reviewed: I have personally reviewed following labs and imaging studies  CBC: Recent Labs  Lab 04/22/20 1613  04/23/20 0526  WBC 11.5* 7.2  NEUTROABS 5.7  --   HGB 12.0 10.4*  HCT 37.0 32.7*  MCV 87.9 89.6  PLT 164 454   Basic Metabolic Panel: Recent Labs  Lab 04/22/20 1613 04/23/20 0526  NA 138 139  K 2.2* 2.8*  CL 95* 102  CO2 25 26  GLUCOSE 125* 151*  BUN 25* 19  CREATININE 1.20* 0.96  CALCIUM 9.3 8.4*  MG  --  1.8  PHOS  --  2.4*   GFR: Estimated Creatinine Clearance: 67.9 mL/min (by C-G formula based on SCr of 0.96 mg/dL). Liver Function Tests: Recent Labs  Lab 04/22/20 1613 04/23/20 0526  AST 48* 37  ALT 33 27  ALKPHOS 62 54  BILITOT 1.3* 1.3*  PROT 8.4* 6.6  ALBUMIN 4.9 3.6   No results for input(s): LIPASE, AMYLASE in the last 168 hours. No results for input(s): AMMONIA in the last 168 hours. Coagulation Profile: No results for input(s): INR, PROTIME in the last 168 hours. Cardiac Enzymes: No results for input(s): CKTOTAL, CKMB, CKMBINDEX, TROPONINI in the last 168 hours. BNP (last 3 results) No results for input(s): PROBNP in the last 8760 hours. HbA1C: Recent Labs    04/22/20 1613  HGBA1C 7.0*   CBG: Recent Labs  Lab 04/22/20 1656 04/23/20 0733 04/23/20 1125 04/23/20 1643  GLUCAP 144* 113* 107* 86   Lipid Profile: No results for input(s): CHOL, HDL, LDLCALC, TRIG, CHOLHDL, LDLDIRECT in the last 72 hours. Thyroid Function Tests: Recent Labs    04/22/20 1613  TSH 0.869   Anemia Panel: No results for input(s): VITAMINB12, FOLATE, FERRITIN, TIBC, IRON, RETICCTPCT in the last 72 hours. Sepsis Labs: No results for input(s): PROCALCITON, LATICACIDVEN in the last 168 hours.  Recent Results (from the past 240 hour(s))  SARS Coronavirus 2 by RT PCR (hospital order, performed in [facility] hospital lab) Nasopharyngeal Nasopharyngeal Swab     Status: None   Collection Time: 04/22/20  8:03 PM   Specimen: Nasopharyngeal Swab  Result Value Ref Range Status   SARS Coronavirus 2 NEGATIVE NEGATIVE Final    Comment: (NOTE) SARS-CoV-2 target nucleic  acids are NOT DETECTED. The SARS-CoV-2 RNA is generally detectable in upper and lower respiratory specimens during the acute phase of infection. The lowest concentration of SARS-CoV-2 viral copies this assay can detect is 250 copies / mL. A negative result does not preclude SARS-CoV-2 infection and should not be used as the sole basis for treatment or other patient management decisions.  A negative result may occur with improper specimen collection / handling, submission of specimen other than nasopharyngeal swab, presence of viral mutation(s) within the areas targeted by this assay, and inadequate number of viral copies (<250 copies / mL). A negative result must be combined with clinical observations, patient history, and epidemiological information. Fact Sheet for Patients:   StrictlyIdeas.no Fact Sheet for Healthcare Providers: BankingDealers.co.za This test is not yet approved or cleared  by the [place]  States FDA and has been authorized for detection and/or diagnosis of SARS-CoV-2 by FDA under an Emergency Use Authorization (EUA).  This EUA will remain in effect (meaning this test can be used) for the duration of the COVID-19 declaration under Section 564(b)(1) of the Act, 21 U.S.C. section 360bbb-3(b)(1), unless the authorization is terminated or revoked sooner. Performed at [facility], [address] [place], [place] [address]   MRSA PCR Screening     Status: None   Collection Time: 04/22/20  9:37 PM   Specimen: Nasal Mucosa; Nasopharyngeal  Result Value Ref Range Status   MRSA by PCR NEGATIVE NEGATIVE Final    Comment:        The GeneXpert MRSA Assay (FDA approved for NASAL specimens only), is one component of a comprehensive MRSA colonization surveillance program. It is not intended to diagnose MRSA infection nor to guide or monitor treatment for MRSA infections. Performed at [facility], [address] [place],  [place] [address]          Radiology Studies: CT Head Wo Contrast  Result Date: 04/23/2020 CLINICAL DATA:  Altered mental status for 1 day. Combative. No reported injury EXAM: CT HEAD WITHOUT CONTRAST TECHNIQUE: Contiguous axial images were obtained from the base of the skull through the vertex without intravenous contrast. COMPARISON:  12/12/2012 head CT. FINDINGS: Brain: Motion degraded scan, limiting assessment. No evidence of parenchymal hemorrhage or extra-axial fluid collection. No mass lesion, mass effect, or midline shift. No definite CT evidence of acute infarction. Cerebral volume is age appropriate. No ventriculomegaly. Vascular: No acute abnormality. Skull: No evidence of calvarial fracture. Sinuses/Orbits: The visualized paranasal sinuses are essentially clear. Other:  The mastoid air cells are unopacified. IMPRESSION: Limited motion degraded scan. No definite evidence of acute intracranial abnormality. Electronically Signed   By: Ilona Sorrel M.D.   On: 04/23/2020 11:02        Scheduled Meds: . Chlorhexidine Gluconate Cloth  6 each Topical Daily  . enoxaparin (LOVENOX) injection  40 mg Subcutaneous Q24H  . insulin aspart  0-20 Units Subcutaneous TID WC  . magnesium oxide  400 mg Oral Once  . mouth rinse  15 mL Mouth Rinse BID   Continuous Infusions: . 0.9 % NaCl with KCl 40 mEq / L 125 mL/hr (04/23/20 1908)     LOS: 0 days    Time spent: 10mns    JKathie Dike MD [facility]   If 7PM-7AM, please contact night-coverage [email or web address]  04/23/2020, 7:51 PM

## 2020-04-23 NOTE — Progress Notes (Addendum)
Pt just pulled IV out and is physical with the staff. Paged hospitalist for possible new orders. Restraints have been applied and new orders received. Repositioned IV and will attempt another once patient is calm.

## 2020-04-23 NOTE — H&P (Signed)
History and Physical    Leslie Lyons XIP:[id number] DOB: 01/20/1961 DOA: 04/22/2020  PCP: [name], MD   Patient coming from: Home.  I have personally briefly reviewed patient's old medical records in [place]  Chief Complaint: AMS.  HPI: Leslie Lyons is a 59 y.o. female with medical history significant of normal transaminases, anxiety, bronchial asthma, history of cervical cancer, depression, type 2 diabetes, headaches, hyperlipidemia, hypertension, class II obesity who is brought to the emergency department by [facility] since her husband has done IVC on her.  She did not add anything else to her story since she has been having paranoid delusions, but has been cooperating with the staff, allowed [place] to place an IV and do lab work.  She denies having any pain, but did not answer any more simple questions.  The patient believes that if she does not stay vigilant something or someone will take over her body.  ED Course: Initial vital signs temperature 97.5 F, pulse 80, respirations 16, blood pressure 119/90 mmHg and O2 sat 100% on room air.  The patient received 1 mg of Ativan IVP, 10 mEq of potassium IVPB and 40 mEq p.o.  Her CBC shows a white count of 11.5, hemoglobin 12.0 g/dL and platelets 164.  Her CMP shows a potassium 2.2 and chloride 95 mmol/L.  The rest of the electrolytes are within normal range.  Glucose 125, BUN 25 creatinine 1.2 mg/dL.  Total protein is 8.4 and albumin 4.9 g/dL.  AST is 48, ALT and alk phos are normal.  Total bilirubin is 1.3 mg/dL.  TSH and alcohol level are normal.  Review of Systems: As per HPI otherwise all other systems reviewed and are negative.  Past Medical History:  Diagnosis Date  . Abnormal transaminases   . Anxiety   . Bronchial asthma    with acute exacerbation  . Cancer ([place]) 1980   cervical, diag  at age 81  . Depression   . Diabetes ([place])   . Headache(784.0)    with reduced vision in right eye for one month   .  Hyperlipidemia   . Hypertension   . Obesity     Past Surgical History:  Procedure Laterality Date  . ABDOMINAL HYSTERECTOMY    . COLONOSCOPY WITH ESOPHAGOGASTRODUODENOSCOPY (EGD)  11/2008   Dr. Arnoldo Morale: Conscious sedation.  Hiatal hernia.  Hemorrhoids. PATIENT REPORTS BEING AWAKE DURING PROCEDURE  . CRYOTHERAPY     for abnormal pap  . HEMORRHOID SURGERY     dr. Irving Shows  . PARTIAL HYSTERECTOMY      Social History  reports that she quit smoking about 4 years ago. Her smoking use included cigarettes. She has a 0.50 pack-year smoking history. She has never used smokeless tobacco. She reports that she does not drink alcohol or use drugs.  Allergies  Allergen Reactions  . Janumet [Sitagliptin-Metformin Hcl]     States she vomits up the whole pill after taking this  . Robaxin [Methocarbamol] Swelling    Family History  Problem Relation Age of Onset  . Cancer Mother        lung   . COPD Father        emphysema  . Hypertension Father   . Cancer Maternal Aunt        breast  . Cancer Paternal Uncle   . Colon cancer Neg Hx    Prior to Admission medications   Medication Sig Start Date End Date Taking? Authorizing Provider  albuterol (PROAIR  HFA) 108 (90 Base) MCG/ACT inhaler Inhale 2 puffs into the lungs every 6 (six) hours as needed. 06/20/18   [name], MD  ALPRAZolam Duanne Moron) 1 MG tablet Take one tablet by mouth two times daily for anxiety 11/07/19   [name], MD  atorvastatin (LIPITOR) 20 MG tablet Take 1 tablet (20 mg total) by mouth daily. 07/31/19   Perlie Mayo, NP  Cholecalciferol (VITAMIN D3) 125 MCG (5000 UT) CAPS Take 1 capsule (5,000 Units total) by mouth daily. 11/28/18   Cassandria Anger, MD  DULoxetine (CYMBALTA) 60 MG capsule Take 60 mg by mouth daily.    [provider]  ezetimibe (ZETIA) 10 MG tablet Take 1 tablet (10 mg total) by mouth daily. Patient taking differently: Take 10 mg by mouth daily.  06/20/18   [name], MD  glipiZIDE (GLUCOTROL XL) 10 MG 24 hr tablet Take 1 tablet (10 mg total) by mouth daily with breakfast. 10/30/19   [name], MD  Insulin Glargine (LANTUS SOLOSTAR) 100 UNIT/ML Solostar Pen Inject 25 Units into the skin at bedtime. 12/04/18   Cassandria Anger, MD  lisinopril (ZESTRIL) 10 MG tablet Take 1 tablet (10 mg total) by mouth daily. 10/30/19   [name], MD  mometasone (NASONEX) 50 MCG/ACT nasal spray Place 2 sprays into the nose daily. 06/20/18   [name], MD  mometasone-formoterol (DULERA) 200-5 MCG/ACT AERO Inhale 2 puffs into the lungs 2 (two) times daily. 06/20/18   [name], MD  tolterodine (DETROL LA) 2 MG 24 hr capsule Take 1 capsule (2 mg total) by mouth daily. Patient taking differently: Take 2 mg by mouth daily. urination 06/20/18   [name], MD  topiramate (TOPAMAX) 100 MG tablet Take 100 mg by mouth 2 (two) times daily.    [provider]   Physical Exam: Vitals:   04/22/20 1345 04/22/20 1347 04/22/20 2153  BP: 119/90    Pulse: 80  (!) 101  Resp: 16  (!) 23  Temp: (!) 97.5 F (36.4 C)  98.8 F (37.1 C)  TempSrc: Temporal  Oral  SpO2: 100%  99%  Weight:  102 kg 91.9 kg  Height:  5' 3" (1.6 m) 5' 3" (1.6 m)   Constitutional: NAD, calm, comfortable Eyes: PERRL, lids and conjunctivae normal ENMT: Mucous membranes are moist. Posterior pharynx clear of any exudate or lesions. Neck: normal, supple, no masses, no thyromegaly Respiratory: Clear to auscultation bilaterally, no wheezing, no crackles. Normal respiratory effort. No accessory muscle use.  Cardiovascular: Regular rate and rhythm, no murmurs / rubs / gallops. No extremity edema. 2+ pedal pulses. No carotid bruits.  Abdomen: Obese, nondistended.  BS positive.  Soft, no tenderness, no masses palpated. No hepatosplenomegaly.  Musculoskeletal: no clubbing / cyanosis. Good ROM, no contractures. Normal muscle tone.  Skin: no rashes, lesions, ulcers on  limited dermatological examination. Neurologic: CN 2-12 grossly intact. Sensation intact, DTR normal. Strength 5/5 in all 4.  Psychiatric: Alert, oriented x3, disoriented to situation and paranoid ideations.  Labs on Admission: I have personally reviewed following labs and imaging studies  CBC: Recent Labs  Lab 04/22/20 1613  WBC 11.5*  NEUTROABS 5.7  HGB 12.0  HCT 37.0  MCV 87.9  PLT 638    Basic Metabolic Panel: Recent Labs  Lab 04/22/20 1613  NA 138  K 2.2*  CL 95*  CO2 25  GLUCOSE 125*  BUN 25*  CREATININE 1.20*  CALCIUM 9.3  GFR: Estimated Creatinine Clearance: 54.3 mL/min (A) (by C-G formula based on SCr of 1.2 mg/dL (H)).  Liver Function Tests: Recent Labs  Lab 04/22/20 1613  AST 48*  ALT 33  ALKPHOS 62  BILITOT 1.3*  PROT 8.4*  ALBUMIN 4.9   Radiological Exams on Admission: No results found.  EKG: Independently reviewed.  Vent. rate 91 BPM PR interval 132 ms QRS duration 80 ms QT/QTc 390/479 ms P-R-T axes 60 85 26 NSR with sinus arrhythmia.  Assessment/Plan Principal Problem:   Hypokalemia Observation/stepdown. Continue potassium replacement. Follow-up potassium level.  Active Problems:   Type 2 diabetes mellitus with stage 2 chronic kidney disease, with long-term current use of insulin (HCC) Carbohydrate modified diet. CBG monitoring with RI SS. Did not know how much insulin she is using. Resume long-acting insulin after med rec. Check hemoglobin A1c.    Mixed hyperlipidemia Resume atorvastatin in a.m.    Essential hypertension, benign Resume lisinopril in a.m. after med rec Monitor blood pressure, renal function electrolytes.    NASH (nonalcoholic steatohepatitis) Monitor LFTs. Needs significant weight loss. Actigall if LFTs worsen.    Psychosis (HCC) Lorazepam 1 mg IVP every 4 hours as needed. Behavioral health evaluation in the morning.   DVT prophylaxis: Lovenox SQ. Code Status:   Full code. Family  Communication:   Disposition Plan:   Patient is from:  Home.  Anticipated DC to:  Home.  Anticipated DC date:  04/23/2020 or 04/24/2020.  Anticipated DC barriers: Clinical improvement.  Consults called:   Admission status:  Observation/stepdown.    Severity of Illness: High severity.  Reubin Milan MD [facility]  How to contact the [facility] Attending or Consulting provider [place] or covering provider during after hours [place], for this patient?   1. Check the care team in [facility] and look for a) attending/consulting [facility] provider listed and b) the [facility] team listed 2. Log into [email or web address] and use [place]'s universal password to access. If you do not have the password, please contact the hospital operator. 3. Locate the [facility] provider you are looking for under [facility] and page to a number that you can be directly reached. 4. If you still have difficulty reaching the provider, please page the [facility] (Director on Call) for the Hospitalists listed on amion for assistance.  04/23/2020, 12:09 AM   This document was prepared using Dragon voice recognition software and may contain some unintended transcription errors.

## 2020-04-23 NOTE — BH Assessment (Signed)
Tele Assessment Note   Patient Name: Leslie Lyons MRN: [id number] Referring Physician: Dr. Dorie Rank Location of Patient: [facility] Location of Provider: [place] is an 59 y.o. female brought in under IVC due to bizarre behaviors, paranoia, hallucinations and experiencing flight of ideas. Upon assessment, patient had flight of ideas, hyper religious statements. According to IVC, husband Leslie Lyons is petitioner, According to the paperwork, the patient has been acting bizarrely for at least the last week.  Patient has not been taking her medications.  She has been undressing standing outside of her home naked.  She has been talking to herself and yelling intermittently.  In the ED patient is very hyper religious.  Patient has been saying that her father is the devil.  Patient told medical staff they were going to hell .  Patient keeps performing the sign of the cross and stating I am sure I am pure.  Spoke with petitioner, Leslie Lyons, husband, [phone]. Leslie Lyons was present during assessment. Leslie Lyons stated onset of current behaviors was 1 month ago and has progressively worsened. Leslie Lyons reported this has never happened before. Leslie Lyons reported patient is not sleeping and not eating. Leslie Lyons reported that the slightest noises keep her up at night. Leslie Lyons reported that patient had bruises on her arms since she has been in the ED and requested how it happened. TTS clinician referred patient back to nurse regarding that situation. Leslie Lyons reported that patient has never been inpatient for mental health, had any suicide attempts, self-harming behaviors or psychosis. Leslie Lyons reported that patient was on depression medicine prior with no concerns.   Diagnosis: Psychosis  Past Medical History:  Past Medical History:  Diagnosis Date  . Abnormal transaminases   . Anxiety   . Bronchial asthma    with acute exacerbation  . Cancer ([place]) 1980   cervical, diag  at age 57  .  Depression   . Diabetes ([place])   . Headache(784.0)    with reduced vision in right eye for one month   . Hyperlipidemia   . Hypertension   . Obesity     Past Surgical History:  Procedure Laterality Date  . ABDOMINAL HYSTERECTOMY    . COLONOSCOPY WITH ESOPHAGOGASTRODUODENOSCOPY (EGD)  11/2008   Dr. Arnoldo Morale: Conscious sedation.  Hiatal hernia.  Hemorrhoids. PATIENT REPORTS BEING AWAKE DURING PROCEDURE  . CRYOTHERAPY     for abnormal pap  . HEMORRHOID SURGERY     dr. Irving Shows  . PARTIAL HYSTERECTOMY      Family History:  Family History  Problem Relation Age of Onset  . Cancer Mother        lung   . COPD Father        emphysema  . Hypertension Father   . Cancer Maternal Aunt        breast  . Cancer Paternal Uncle   . Colon cancer Neg Hx     Social History:  reports that she quit smoking about 4 years ago. Her smoking use included cigarettes. She has a 0.50 pack-year smoking history. She has never used smokeless tobacco. She reports that she does not drink alcohol or use drugs.  Additional Social History:  Alcohol / Drug Use Pain Medications: see MAR Prescriptions: see MAR Over the Counter: see MAR  CIWA: CIWA-Ar BP: (Pt refuses to have BP checked at the time) Pulse Rate: 88 COWS:    Allergies:  Allergies  Allergen Reactions  . Janumet [Sitagliptin-Metformin Hcl]  States she vomits up the whole pill after taking this  . Robaxin [Methocarbamol] Swelling    Home Medications:  Medications Prior to Admission  Medication Sig Dispense Refill  . albuterol (PROAIR HFA) 108 (90 Base) MCG/ACT inhaler Inhale 2 puffs into the lungs every 6 (six) hours as needed. 1 Inhaler 3  . ALPRAZolam (XANAX) 1 MG tablet Take one tablet by mouth two times daily for anxiety 60 tablet 3  . atorvastatin (LIPITOR) 20 MG tablet Take 1 tablet (20 mg total) by mouth daily. 90 tablet 3  . Cholecalciferol (VITAMIN D3) 125 MCG (5000 UT) CAPS Take 1 capsule (5,000 Units total) by mouth daily.  90 capsule 0  . DULoxetine (CYMBALTA) 60 MG capsule Take 60 mg by mouth daily.    Marland Kitchen ezetimibe (ZETIA) 10 MG tablet Take 1 tablet (10 mg total) by mouth daily. (Patient taking differently: Take 10 mg by mouth daily. ) 90 tablet 3  . glipiZIDE (GLUCOTROL XL) 10 MG 24 hr tablet Take 1 tablet (10 mg total) by mouth daily with breakfast. 30 tablet 5  . Insulin Glargine (LANTUS SOLOSTAR) 100 UNIT/ML Solostar Pen Inject 25 Units into the skin at bedtime. 5 pen 5  . lisinopril (ZESTRIL) 10 MG tablet Take 1 tablet (10 mg total) by mouth daily. 30 tablet 5  . mometasone (NASONEX) 50 MCG/ACT nasal spray Place 2 sprays into the nose daily. 51 g 3  . mometasone-formoterol (DULERA) 200-5 MCG/ACT AERO Inhale 2 puffs into the lungs 2 (two) times daily. 3 Inhaler 3  . tolterodine (DETROL LA) 2 MG 24 hr capsule Take 1 capsule (2 mg total) by mouth daily. (Patient taking differently: Take 2 mg by mouth daily. urination) 90 capsule 3  . topiramate (TOPAMAX) 100 MG tablet Take 100 mg by mouth 2 (two) times daily.      OB/GYN Status:  No LMP recorded. Patient has had a hysterectomy.  General Assessment Data Location of Assessment: [facility] TTS Assessment: In system Is this a Tele or Face-to-Face Assessment?: Tele Assessment Is this an Initial Assessment or a Re-assessment for this encounter?: Initial Assessment Patient Accompanied by:: N/A Language Other than English: No Living Arrangements: (family home) What gender do you identify as?: Female Marital status: Married Pregnancy Status: Unknown Living Arrangements: Spouse/significant other Can pt return to current living arrangement?: Yes Admission Status: Voluntary Is patient capable of signing voluntary admission?: No Referral Source: Self/Family/Friend  Crisis Care Plan Living Arrangements: Spouse/significant other Legal Guardian: (self) Name of Psychiatrist: (none) Name of Therapist: (none)  Education Status Is patient currently in school?: No Is  the patient employed, unemployed or receiving disability?: Unemployed  Risk to self with the past 6 months Suicidal Ideation: (unable to assess) Has patient been a risk to self within the past 6 months prior to admission? : (unable to assess) Suicidal Intent: (unable to assess) Has patient had any suicidal intent within the past 6 months prior to admission? : (unable to assess) Is patient at risk for suicide?: (unable to assess) Suicidal Plan?: (unable to assess) Has patient had any suicidal plan within the past 6 months prior to admission? : (unable to assess) Access to Means: (unable to assess) What has been your use of drugs/alcohol within the last 12 months?: (none) Previous Attempts/Gestures: No How many times?: (0) Other Self Harm Risks: (none) Triggers for Past Attempts: (n/a) Intentional Self Injurious Behavior: None Family Suicide History: No Recent stressful life event(s): (psychosis) Persecutory voices/beliefs?: (unable to assess) Depression: Yes Depression Symptoms: (unable  to assess) Substance abuse history and/or treatment for substance abuse?: No Suicide prevention information given to non-admitted patients: Not applicable  Risk to Others within the past 6 months Homicidal Ideation: No Does patient have any lifetime risk of violence toward others beyond the six months prior to admission? : No Thoughts of Harm to Others: (unable to assess) Current Homicidal Intent: (unable to assess) Current Homicidal Plan: (unable to assess) Access to Homicidal Means: (unable to assess) History of harm to others?: No Assessment of Violence: None Noted Violent Behavior Description: (none) Does patient have access to weapons?: No Criminal Charges Pending?: No Does patient have a court date: No Is patient on probation?: No  Psychosis Hallucinations: Auditory, Visual(unable to assess) Delusions: Unspecified  Mental Status Report Appearance/Hygiene: Unremarkable Eye Contact:  Unable to Assess Motor Activity: Freedom of movement Speech: Incoherent, Pressured, Loud Level of Consciousness: Alert, Restless Mood: Preoccupied Affect: Preoccupied, Irritable Anxiety Level: Moderate Thought Processes: Unable to Assess Judgement: Unable to Assess Orientation: Unable to assess Obsessive Compulsive Thoughts/Behaviors: Unable to Assess  Cognitive Functioning Concentration: Unable to Assess Memory: Unable to Assess Is patient IDD: No Insight: Unable to Assess Impulse Control: Unable to Assess Appetite: Poor Have you had any weight changes? : No Change Sleep: Decreased Total Hours of Sleep: (no sleep in 2 weeks) Vegetative Symptoms: Unable to Assess  ADLScreening [facility] Assessment Services) Patient's cognitive ability adequate to safely complete daily activities?: No Patient able to express need for assistance with ADLs?: Yes Independently performs ADLs?: Yes (appropriate for developmental age)  Prior Inpatient Therapy Prior Inpatient Therapy: No  Prior Outpatient Therapy Prior Outpatient Therapy: No Does patient have an ACCT team?: No Does patient have Intensive In-House Services?  : No Does patient have [facility] services? : No Does patient have P4CC services?: No  ADL Screening (condition at time of admission) Patient's cognitive ability adequate to safely complete daily activities?: No Patient able to express need for assistance with ADLs?: Yes Independently performs ADLs?: Yes (appropriate for developmental age)  Disposition:  Disposition Initial Assessment Completed for this Encounter: Yes  Talbot Grumbling, NP, patient meets inpatient criteria. [facility] at capacity. TTS to secure placement.   This service was provided via telemedicine using a 2-way, interactive audio and video technology.  Names of all persons participating in this telemedicine service and their role in this encounter. Name: Leslie Lyons Role: Patient  Name: Kirtland Bouchard Role: TTS  Clinician  Name:  Role:   Name:  Role:     Venora Maples 04/23/2020 8:09 PM

## 2020-04-23 NOTE — Progress Notes (Signed)
Pt leaning over the side of the bed trying to "spit out the darkness." She is slowly getting louder saying she is "pure" and that she is with "God her father."

## 2020-04-23 NOTE — Progress Notes (Signed)
[facility] night shift.  The patient pulled her IV line and was physically threatening to the staff.  Lorazepam has been effective for about 2 hours, but the patient has been waking up after that and getting restless.  Restraints have been ordered.  Lorazepam 2 mg IVP x1 dose now.  I will increase the frequency of the lorazepam 1 mg IVP from every 4 hours to every 2 hours as needed for anxiety or restlessness.  Tennis Must, MD

## 2020-04-23 NOTE — Progress Notes (Signed)
Two new IV's placed while the patient was "spinning" her head and repeating "Father, Son and Leslie Lyons."

## 2020-04-24 DIAGNOSIS — K7581 Nonalcoholic steatohepatitis (NASH): Secondary | ICD-10-CM

## 2020-04-24 DIAGNOSIS — F29 Unspecified psychosis not due to a substance or known physiological condition: Secondary | ICD-10-CM

## 2020-04-24 DIAGNOSIS — N182 Chronic kidney disease, stage 2 (mild): Secondary | ICD-10-CM

## 2020-04-24 DIAGNOSIS — E1122 Type 2 diabetes mellitus with diabetic chronic kidney disease: Secondary | ICD-10-CM

## 2020-04-24 DIAGNOSIS — Z794 Long term (current) use of insulin: Secondary | ICD-10-CM

## 2020-04-24 DIAGNOSIS — E782 Mixed hyperlipidemia: Secondary | ICD-10-CM

## 2020-04-24 DIAGNOSIS — I10 Essential (primary) hypertension: Secondary | ICD-10-CM

## 2020-04-24 LAB — GLUCOSE, CAPILLARY
Glucose-Capillary: 91 mg/dL (ref 70–99)
Glucose-Capillary: 90 mg/dL (ref 70–99)
Glucose-Capillary: 97 mg/dL (ref 70–99)
Glucose-Capillary: 109 mg/dL — ABNORMAL HIGH (ref 70–99)

## 2020-04-24 LAB — BASIC METABOLIC PANEL
Anion gap: 10 (ref 5–15)
BUN: 11 mg/dL (ref 6–20)
CO2: 21 mmol/L — ABNORMAL LOW (ref 22–32)
Calcium: 8.6 mg/dL — ABNORMAL LOW (ref 8.9–10.3)
Chloride: 111 mmol/L (ref 98–111)
Creatinine, Ser: 0.78 mg/dL (ref 0.44–1.00)
GFR calc Af Amer: >60 mL/min (ref >60)
GFR calc non Af Amer: >60 mL/min (ref >60)
Glucose, Bld: 91 mg/dL (ref 70–99)
Potassium: 3.8 mmol/L (ref 3.5–5.1)
Sodium: 142 mmol/L (ref 135–145)

## 2020-04-24 LAB — AMMONIA: Ammonia: 32 umol/L (ref 9–35)

## 2020-04-24 NOTE — Care Management (Signed)
Writer referred patient to the following inpatient facilities;  [facility] Details Fax  [address] [place] [address]  Internal comment [facility] Details Fax  [address], [place] [address]  Internal comment [facility] Details Fax  [address] [place] [address]  Internal comment [facility] Details Fax  [address], [place] [address]  Internal comment [facility] Details Fax  [address] [place] [address]  Internal comment [facility] Details Fax  [address] [place], [place] [address]  Internal comment [facility] Details Fax  [address], [place] [address]  Internal comment [facility] Details Fax  [place] [address] [place] [address]  Internal comment [facility] Details Fax  [address] [place] [address]  Internal comment [facility] Details Fax  [address] [place] [address]  Internal comment [facility] Details Fax  [address] [place] [address]  Internal comment [place] Details Fax  [address] [place] [address]  Internal comment [facility] Details Fax  [address], [place] [address]  Internal comment [facility] Details Fax  [address], [place] [address]  Internal comment [place] Details Fax  [address] [place] [address]  Internal comment

## 2020-04-24 NOTE — Progress Notes (Signed)
Pt soiled her bed, requiring complete bed change and bath. While bathing the patient, she began kicking at staff. She is unable to follow commands; would not calm down, would not turn, continuing to kick. Bath complete, bed changed, new leads placed. Pt is safe, restraints retied and applied correctly.

## 2020-04-24 NOTE — Progress Notes (Signed)
Patient's belongings given to Patient's husband, Dominica Severin. Dominica Severin and sister are at bedside with patient. Currently patient is only letting sister do anything for her.

## 2020-04-24 NOTE — BH Assessment (Signed)
[place] Assessment Progress Note    Patient was seen for re-assessment.  She states that she did not sleep well last night.  Patient states that she did not eat because she doe not "trust the food."  Patient states that her sister is the only person that she trusts at present. Patient continues to be psychotic and delusional.  Her thoughts are not organized and she appears to be mildly agitated. TTS will continue to seek placement for patient.

## 2020-04-24 NOTE — Progress Notes (Signed)
PROGRESS NOTE    Leslie Lyons  UYQ:[id number] DOB: 1961/10/13 DOA: 04/22/2020 PCP: [name], MD    Brief Narrative:  59 year old female admitted to the hospital with paranoia/delusions and bizarre behavior which is progressively getting worse over the past several weeks.  She was evaluated in the emergency room and found to be significantly hypokalemic and was therefore referred for medical admission.  With electrolyte replacement, she has been medically cleared for discharge.  Seen by psychiatry with recommendations for inpatient psychiatry placement.  She is currently awaiting a bed at inpatient psychiatry.   Assessment & Plan:   Principal Problem:   Hypokalemia Active Problems:   Mixed hyperlipidemia   Essential hypertension, benign   Type 2 diabetes mellitus with stage 2 chronic kidney disease, with long-term current use of insulin (HCC)   NASH (nonalcoholic steatohepatitis)   Psychosis ([place])   1. Acute psychosis.  Unclear etiology.  Husband reports that she has been having delusions/hallucination intermittently for the past several weeks.  These have gotten progressively worse.  CT head on admission was noted to be unremarkable.  Urine tox screen positive for cannabis as well as benzodiazepines.  Urinalysis does not show any signs of infection.  She did not have any obvious sources of infection.  TSH is normal.  Ammonia level normal.  Ethanol level is undetectable.  Suspect that psychiatric illnesses main cause of psychosis.  She does not have any focal neurologic deficits.  She is currently on involuntary commitment.  She has required intermittent doses of Ativan for agitation.  Seen by psychiatry with recommendations for inpatient psychiatric placement. 2. Hypokalemia.  She is not on any diuretics.  Potassium level has normalized with replacement magnesium level 1.8. 3. Diabetes.  Blood sugars currently stable.  Continue on sliding scale insulin. 4. Hypertension.   Resume lisinopril on discharge.   DVT prophylaxis: Lovenox Code Status: Full code Family Communication: Discussed with husband and sister at the bedside Disposition Plan: Status is: Inpatient  Remains inpatient appropriate because:Altered mental status.  Mental status has not returned to baseline.   Dispo: The patient is from: Home              Anticipated d/c is to: Awaiting inpatient psychiatry placement              Anticipated d/c date is: 1 day              Patient currently is medically stable to d/c.  She is awaiting inpatient psychiatry placement    Consultants:     Procedures:     Antimicrobials:       Subjective: Continues to require restraints overnight.  She remains paranoid and delusional.  Objective: Vitals:   04/24/20 1000 04/24/20 1124 04/24/20 1600 04/24/20 1838  BP:    140/72  Pulse:      Resp: 17 (!) 27 17 (!) 21  Temp:  98.7 F (37.1 C) 99.6 F (37.6 C)   TempSrc:  Oral Axillary   SpO2:      Weight:      Height:        Intake/Output Summary (Last 24 hours) at 04/24/2020 1942 Last data filed at 04/24/2020 1755 Gross per 24 hour  Intake 1317.49 ml  Output 700 ml  Net 617.49 ml   Filed Weights   04/22/20 1347 04/22/20 2153 04/23/20 0500  Weight: 102 kg 91.9 kg 91.9 kg    Examination:  General exam: Alert, awake, no distress Respiratory system: Clear to auscultation.  Respiratory effort normal. Cardiovascular system:RRR. No murmurs, rubs, gallops. Gastrointestinal system: Abdomen is nondistended, soft and nontender. No organomegaly or masses felt. Normal bowel sounds heard. Central nervous system: No focal neurological deficits. Extremities: No C/C/E, +pedal pulses Skin: No rashes, lesions or ulcers Psychiatry: She remains paranoid and delusional.      Data Reviewed: I have personally reviewed following labs and imaging studies  CBC: Recent Labs  Lab 04/22/20 1613 04/23/20 0526  WBC 11.5* 7.2  NEUTROABS 5.7  --   HGB  12.0 10.4*  HCT 37.0 32.7*  MCV 87.9 89.6  PLT 164 938   Basic Metabolic Panel: Recent Labs  Lab 04/22/20 1613 04/23/20 0526 04/24/20 0352  NA 138 139 142  K 2.2* 2.8* 3.8  CL 95* 102 111  CO2 25 26 21*  GLUCOSE 125* 151* 91  BUN 25* 19 11  CREATININE 1.20* 0.96 0.78  CALCIUM 9.3 8.4* 8.6*  MG  --  1.8  --   PHOS  --  2.4*  --    GFR: Estimated Creatinine Clearance: 81.5 mL/min (by C-G formula based on SCr of 0.78 mg/dL). Liver Function Tests: Recent Labs  Lab 04/22/20 1613 04/23/20 0526  AST 48* 37  ALT 33 27  ALKPHOS 62 54  BILITOT 1.3* 1.3*  PROT 8.4* 6.6  ALBUMIN 4.9 3.6   No results for input(s): LIPASE, AMYLASE in the last 168 hours. Recent Labs  Lab 04/24/20 0352  AMMONIA 32   Coagulation Profile: No results for input(s): INR, PROTIME in the last 168 hours. Cardiac Enzymes: No results for input(s): CKTOTAL, CKMB, CKMBINDEX, TROPONINI in the last 168 hours. BNP (last 3 results) No results for input(s): PROBNP in the last 8760 hours. HbA1C: Recent Labs    04/22/20 1613  HGBA1C 7.0*   CBG: Recent Labs  Lab 04/23/20 1643 04/23/20 2115 04/24/20 0809 04/24/20 1122 04/24/20 1559  GLUCAP 86 96 91 90 97   Lipid Profile: No results for input(s): CHOL, HDL, LDLCALC, TRIG, CHOLHDL, LDLDIRECT in the last 72 hours. Thyroid Function Tests: Recent Labs    04/22/20 1613  TSH 0.869   Anemia Panel: No results for input(s): VITAMINB12, FOLATE, FERRITIN, TIBC, IRON, RETICCTPCT in the last 72 hours. Sepsis Labs: No results for input(s): PROCALCITON, LATICACIDVEN in the last 168 hours.  Recent Results (from the past 240 hour(s))  SARS Coronavirus 2 by RT PCR (hospital order, performed in [facility] hospital lab) Nasopharyngeal Nasopharyngeal Swab     Status: None   Collection Time: 04/22/20  8:03 PM   Specimen: Nasopharyngeal Swab  Result Value Ref Range Status   SARS Coronavirus 2 NEGATIVE NEGATIVE Final    Comment: (NOTE) SARS-CoV-2 target  nucleic acids are NOT DETECTED. The SARS-CoV-2 RNA is generally detectable in upper and lower respiratory specimens during the acute phase of infection. The lowest concentration of SARS-CoV-2 viral copies this assay can detect is 250 copies / mL. A negative result does not preclude SARS-CoV-2 infection and should not be used as the sole basis for treatment or other patient management decisions.  A negative result may occur with improper specimen collection / handling, submission of specimen other than nasopharyngeal swab, presence of viral mutation(s) within the areas targeted by this assay, and inadequate number of viral copies (<250 copies / mL). A negative result must be combined with clinical observations, patient history, and epidemiological information. Fact Sheet for Patients:   StrictlyIdeas.no Fact Sheet for Healthcare Providers: BankingDealers.co.za This test is not yet approved or cleared  by  the [employer] and has been authorized for detection and/or diagnosis of SARS-CoV-2 by FDA under an Emergency Use Authorization (EUA).  This EUA will remain in effect (meaning this test can be used) for the duration of the COVID-19 declaration under Section 564(b)(1) of the Act, 21 U.S.C. section 360bbb-3(b)(1), unless the authorization is terminated or revoked sooner. Performed at [facility], [address], [place], [place] [address]   MRSA PCR Screening     Status: None   Collection Time: 04/22/20  9:37 PM   Specimen: Nasal Mucosa; Nasopharyngeal  Result Value Ref Range Status   MRSA by PCR NEGATIVE NEGATIVE Final    Comment:        The GeneXpert MRSA Assay (FDA approved for NASAL specimens only), is one component of a comprehensive MRSA colonization surveillance program. It is not intended to diagnose MRSA infection nor to guide or monitor treatment for MRSA infections. Performed at [facility], [address],  [place], [place] [address]          Radiology Studies: CT Head Wo Contrast  Result Date: 04/23/2020 CLINICAL DATA:  Altered mental status for 1 day. Combative. No reported injury EXAM: CT HEAD WITHOUT CONTRAST TECHNIQUE: Contiguous axial images were obtained from the base of the skull through the vertex without intravenous contrast. COMPARISON:  12/12/2012 head CT. FINDINGS: Brain: Motion degraded scan, limiting assessment. No evidence of parenchymal hemorrhage or extra-axial fluid collection. No mass lesion, mass effect, or midline shift. No definite CT evidence of acute infarction. Cerebral volume is age appropriate. No ventriculomegaly. Vascular: No acute abnormality. Skull: No evidence of calvarial fracture. Sinuses/Orbits: The visualized paranasal sinuses are essentially clear. Other:  The mastoid air cells are unopacified. IMPRESSION: Limited motion degraded scan. No definite evidence of acute intracranial abnormality. Electronically Signed   By: Ilona Sorrel M.D.   On: 04/23/2020 11:02        Scheduled Meds: . Chlorhexidine Gluconate Cloth  6 each Topical Daily  . enoxaparin (LOVENOX) injection  40 mg Subcutaneous Q24H  . insulin aspart  0-20 Units Subcutaneous TID WC  . magnesium oxide  400 mg Oral Once  . mouth rinse  15 mL Mouth Rinse BID   Continuous Infusions: . 0.9 % NaCl with KCl 40 mEq / L 125 mL/hr (04/24/20 0319)     LOS: 1 day    Time spent: 69mns    JKathie Dike MD [facility]   If 7PM-7AM, please contact night-coverage [email or web address]  04/24/2020, 7:42 PM

## 2020-04-24 NOTE — Progress Notes (Signed)
Pt son drove in from [place] and was in the ER wanting to visit at 0400. Had the ED registration explain to him that visiting hours are at 0700. He was very polite and pleasant and asked to add him to her contacts. Explained the visiting policy and advised him to speak with Dominica Severin and Manuela Schwartz regarding visiting his mom.

## 2020-04-25 LAB — GLUCOSE, CAPILLARY
Glucose-Capillary: 112 mg/dL — ABNORMAL HIGH (ref 70–99)
Glucose-Capillary: 123 mg/dL — ABNORMAL HIGH (ref 70–99)
Glucose-Capillary: 121 mg/dL — ABNORMAL HIGH (ref 70–99)
Glucose-Capillary: 120 mg/dL — ABNORMAL HIGH (ref 70–99)

## 2020-04-25 MED ORDER — LISINOPRIL 10 MG PO TABS
10.0000 mg | ORAL_TABLET | Freq: Every day | ORAL | Status: DC
  Administered 2020-04-25 – 2020-04-26 (×2): 10 mg via ORAL
  Filled 2020-04-25 (×2): qty 1

## 2020-04-25 MED ORDER — TOPIRAMATE 100 MG PO TABS
100.0000 mg | ORAL_TABLET | Freq: Two times a day (BID) | ORAL | Status: DC
  Administered 2020-04-25 – 2020-04-28 (×6): 100 mg via ORAL
  Filled 2020-04-25 (×6): qty 1

## 2020-04-25 MED ORDER — DIPHENHYDRAMINE HCL 50 MG/ML IJ SOLN
12.5000 mg | Freq: Once | INTRAMUSCULAR | Status: AC
  Administered 2020-04-25: 12.5 mg via INTRAVENOUS
  Filled 2020-04-25: qty 1

## 2020-04-25 MED ORDER — ATORVASTATIN CALCIUM 20 MG PO TABS
20.0000 mg | ORAL_TABLET | Freq: Every day | ORAL | Status: DC
  Administered 2020-04-25 – 2020-04-28 (×4): 20 mg via ORAL
  Filled 2020-04-25 (×4): qty 1

## 2020-04-25 MED ORDER — HALOPERIDOL LACTATE 5 MG/ML IJ SOLN
2.0000 mg | INTRAMUSCULAR | Status: AC
  Administered 2020-04-25: 2 mg via INTRAMUSCULAR
  Filled 2020-04-25: qty 1

## 2020-04-25 MED ORDER — ALPRAZOLAM 0.5 MG PO TABS
1.0000 mg | ORAL_TABLET | Freq: Two times a day (BID) | ORAL | Status: DC
  Administered 2020-04-25 – 2020-04-26 (×2): 1 mg via ORAL
  Filled 2020-04-25 (×2): qty 2

## 2020-04-25 MED ORDER — MOMETASONE FURO-FORMOTEROL FUM 200-5 MCG/ACT IN AERO
2.0000 | INHALATION_SPRAY | Freq: Two times a day (BID) | RESPIRATORY_TRACT | Status: DC
  Administered 2020-04-25 – 2020-04-28 (×3): 2 via RESPIRATORY_TRACT
  Filled 2020-04-25: qty 8.8

## 2020-04-25 MED ORDER — ALBUTEROL SULFATE (2.5 MG/3ML) 0.083% IN NEBU
3.0000 mL | INHALATION_SOLUTION | Freq: Four times a day (QID) | RESPIRATORY_TRACT | Status: DC | PRN
  Administered 2020-04-25: 3 mL via RESPIRATORY_TRACT
  Filled 2020-04-25: qty 3

## 2020-04-25 NOTE — Progress Notes (Signed)
Patient is out of bilateral wrist restraints. Still has 1:1 safety sitter at bedside. Currently patient is resting with all vital signs stable.

## 2020-04-25 NOTE — Progress Notes (Signed)
PROGRESS NOTE    Leslie Lyons  POE:[id number] DOB: Mar 09, 1961 DOA: 04/22/2020 PCP: [name], MD    Brief Narrative:  59 year old female admitted to the hospital with paranoia/delusions and bizarre behavior which is progressively getting worse over the past several weeks.  She was evaluated in the emergency room and found to be significantly hypokalemic and was therefore referred for medical admission.  With electrolyte replacement, she has been medically cleared for discharge.  Seen by psychiatry with recommendations for inpatient psychiatry placement.  She is currently awaiting a bed at inpatient psychiatry.   Assessment & Plan:   Principal Problem:   Hypokalemia Active Problems:   Mixed hyperlipidemia   Essential hypertension, benign   Type 2 diabetes mellitus with stage 2 chronic kidney disease, with long-term current use of insulin (HCC)   NASH (nonalcoholic steatohepatitis)   Psychosis ([place])   1. Acute psychosis.  Unclear etiology.  Husband reports that she has been having delusions/hallucination intermittently for the past several weeks.  These have gotten progressively worse.  CT head on admission was noted to be unremarkable.  Urine tox screen positive for cannabis as well as benzodiazepines.  Urinalysis does not show any signs of infection.  She did not have any obvious sources of infection.  TSH is normal.  Ammonia level normal.  Ethanol level is undetectable.  Suspect that psychiatric illnesses main cause of psychosis.  She does not have any focal neurologic deficits.  She is currently on involuntary commitment.  She has required intermittent doses of Ativan for agitation.  Seen by psychiatry with recommendations for inpatient psychiatric placement. 2. Hypokalemia.  She is not on any diuretics.  Potassium level has normalized with replacement magnesium level 1.8. 3. Diabetes.  Blood sugars currently stable.  Continue on sliding scale insulin. 4. Hypertension.   Resume lisinopril 5. History of asthma.  Continue on inhaled steroids and bronchodilators as needed.   DVT prophylaxis: Lovenox Code Status: Full code Family Communication: Discussed with husband and sister at the bedside Disposition Plan: Status is: Inpatient  Remains inpatient appropriate because:Altered mental status.  Mental status has not returned to baseline.   Dispo: The patient is from: Home              Anticipated d/c is to: Awaiting inpatient psychiatry placement              Anticipated d/c date is: 1 day              Patient currently is medically stable to d/c.  She is awaiting inpatient psychiatry placement    Consultants:     Procedures:     Antimicrobials:       Subjective: Recently received Ativan and lethargic this morning.  Restraints were removed overnight.  Objective: Vitals:   04/25/20 1100 04/25/20 1200 04/25/20 1600 04/25/20 1800  BP:  (!) 154/94    Pulse: 93 88  (!) 101  Resp: (!) 23 (!) 23  (!) 26  Temp:  98.9 F (37.2 C) 99.3 F (37.4 C)   TempSrc:  Oral Oral   SpO2: 99% 100%  99%  Weight:      Height:        Intake/Output Summary (Last 24 hours) at 04/25/2020 1956 Last data filed at 04/25/2020 1900 Gross per 24 hour  Intake 3714.08 ml  Output 3600 ml  Net 114.08 ml   Filed Weights   04/22/20 1347 04/22/20 2153 04/23/20 0500  Weight: 102 kg 91.9 kg 91.9 kg  Examination:  General exam: Somnolent Respiratory system: Clear to auscultation. Respiratory effort normal. Cardiovascular system:RRR. No murmurs, rubs, gallops. Gastrointestinal system: Abdomen is nondistended, soft and nontender. No organomegaly or masses felt. Normal bowel sounds heard. Central nervous system:. No focal neurological deficits. Extremities: No C/C/E, +pedal pulses Skin: No rashes, lesions or ulcers Psychiatry: Somnolent   Data Reviewed: I have personally reviewed following labs and imaging studies  CBC: Recent Labs  Lab 04/22/20 1613  04/23/20 0526  WBC 11.5* 7.2  NEUTROABS 5.7  --   HGB 12.0 10.4*  HCT 37.0 32.7*  MCV 87.9 89.6  PLT 164 381   Basic Metabolic Panel: Recent Labs  Lab 04/22/20 1613 04/23/20 0526 04/24/20 0352  NA 138 139 142  K 2.2* 2.8* 3.8  CL 95* 102 111  CO2 25 26 21*  GLUCOSE 125* 151* 91  BUN 25* 19 11  CREATININE 1.20* 0.96 0.78  CALCIUM 9.3 8.4* 8.6*  MG  --  1.8  --   PHOS  --  2.4*  --    GFR: Estimated Creatinine Clearance: 81.5 mL/min (by C-G formula based on SCr of 0.78 mg/dL). Liver Function Tests: Recent Labs  Lab 04/22/20 1613 04/23/20 0526  AST 48* 37  ALT 33 27  ALKPHOS 62 54  BILITOT 1.3* 1.3*  PROT 8.4* 6.6  ALBUMIN 4.9 3.6   No results for input(s): LIPASE, AMYLASE in the last 168 hours. Recent Labs  Lab 04/24/20 0352  AMMONIA 32   Coagulation Profile: No results for input(s): INR, PROTIME in the last 168 hours. Cardiac Enzymes: No results for input(s): CKTOTAL, CKMB, CKMBINDEX, TROPONINI in the last 168 hours. BNP (last 3 results) No results for input(s): PROBNP in the last 8760 hours. HbA1C: No results for input(s): HGBA1C in the last 72 hours. CBG: Recent Labs  Lab 04/24/20 1559 04/24/20 2100 04/25/20 0800 04/25/20 1143 04/25/20 1547  GLUCAP 97 109* 112* 123* 121*   Lipid Profile: No results for input(s): CHOL, HDL, LDLCALC, TRIG, CHOLHDL, LDLDIRECT in the last 72 hours. Thyroid Function Tests: No results for input(s): TSH, T4TOTAL, FREET4, T3FREE, THYROIDAB in the last 72 hours. Anemia Panel: No results for input(s): VITAMINB12, FOLATE, FERRITIN, TIBC, IRON, RETICCTPCT in the last 72 hours. Sepsis Labs: No results for input(s): PROCALCITON, LATICACIDVEN in the last 168 hours.  Recent Results (from the past 240 hour(s))  SARS Coronavirus 2 by RT PCR (hospital order, performed in [facility] hospital lab) Nasopharyngeal Nasopharyngeal Swab     Status: None   Collection Time: 04/22/20  8:03 PM   Specimen: Nasopharyngeal Swab  Result  Value Ref Range Status   SARS Coronavirus 2 NEGATIVE NEGATIVE Final    Comment: (NOTE) SARS-CoV-2 target nucleic acids are NOT DETECTED. The SARS-CoV-2 RNA is generally detectable in upper and lower respiratory specimens during the acute phase of infection. The lowest concentration of SARS-CoV-2 viral copies this assay can detect is 250 copies / mL. A negative result does not preclude SARS-CoV-2 infection and should not be used as the sole basis for treatment or other patient management decisions.  A negative result may occur with improper specimen collection / handling, submission of specimen other than nasopharyngeal swab, presence of viral mutation(s) within the areas targeted by this assay, and inadequate number of viral copies (<250 copies / mL). A negative result must be combined with clinical observations, patient history, and epidemiological information. Fact Sheet for Patients:   StrictlyIdeas.no Fact Sheet for Healthcare Providers: BankingDealers.co.za This test is not yet approved or  cleared  by the [place] and has been authorized for detection and/or diagnosis of SARS-CoV-2 by FDA under an Emergency Use Authorization (EUA).  This EUA will remain in effect (meaning this test can be used) for the duration of the COVID-19 declaration under Section 564(b)(1) of the Act, 21 U.S.C. section 360bbb-3(b)(1), unless the authorization is terminated or revoked sooner. Performed at [facility], [address] [place], [place] [address]   MRSA PCR Screening     Status: None   Collection Time: 04/22/20  9:37 PM   Specimen: Nasal Mucosa; Nasopharyngeal  Result Value Ref Range Status   MRSA by PCR NEGATIVE NEGATIVE Final    Comment:        The GeneXpert MRSA Assay (FDA approved for NASAL specimens only), is one component of a comprehensive MRSA colonization surveillance program. It is not intended to diagnose MRSA infection  nor to guide or monitor treatment for MRSA infections. Performed at [facility], [address] [place], [place] [address]          Radiology Studies: No results found.      Scheduled Meds: . ALPRAZolam  1 mg Oral BID  . atorvastatin  20 mg Oral Daily  . Chlorhexidine Gluconate Cloth  6 each Topical Daily  . enoxaparin (LOVENOX) injection  40 mg Subcutaneous Q24H  . insulin aspart  0-20 Units Subcutaneous TID WC  . lisinopril  10 mg Oral Daily  . magnesium oxide  400 mg Oral Once  . mouth rinse  15 mL Mouth Rinse BID  . mometasone-formoterol  2 puff Inhalation BID  . topiramate  100 mg Oral BID   Continuous Infusions:    LOS: 2 days    Time spent: 11mns    JKathie Dike MD [facility]   If 7PM-7AM, please contact night-coverage [email or web address]  04/25/2020, 7:56 PM

## 2020-04-25 NOTE — BHH Counselor (Signed)
Leslie Lyons from [facility] called to accept pt for tomorrow, 04/26/20, anytime. Dr. Jonelle Sports is accepting MD. Please call report to [phone].

## 2020-04-25 NOTE — Progress Notes (Signed)
Pt meets inpatient criteria. Referral information has been sent to the following hospitals for review:  [facility] Details [facility] Details  [facility] Details [facility] Details  [facility] Details [facility] Details [place] Details  [place] Details [facility] Details [facility] Details  [facility]   Disposition will continue to follow.    Audree Camel, LCSW, [place] Disposition [facility]/TTS [phone]

## 2020-04-25 NOTE — Progress Notes (Signed)
Patient still paranoid , confused took dulera poorly. Then requested neb, started neb took about 2 minutes stopped.

## 2020-04-25 NOTE — Progress Notes (Signed)
Order obtained for IV benadryl as patient is itching all over. Pt had staff rub her back and apply lotion, but she continues to itch.

## 2020-04-25 NOTE — Care Management Important Message (Signed)
Important Message  Patient Details  Name: Leslie Lyons MRN: [id number] Date of Birth: 1961/02/07   Medicare Important Message Given:  Yes     Tommy Medal 04/25/2020, 3:48 PM

## 2020-04-26 ENCOUNTER — Other Ambulatory Visit: Payer: Self-pay

## 2020-04-26 ENCOUNTER — Inpatient Hospital Stay (HOSPITAL_COMMUNITY): Payer: Medicare Other

## 2020-04-26 LAB — URINALYSIS, ROUTINE W REFLEX MICROSCOPIC
Bilirubin Urine: NEGATIVE
Glucose, UA: NEGATIVE mg/dL
Hgb urine dipstick: NEGATIVE
Ketones, ur: NEGATIVE mg/dL
Leukocytes,Ua: NEGATIVE
Nitrite: NEGATIVE
Protein, ur: NEGATIVE mg/dL
Specific Gravity, Urine: 1.008 (ref 1.005–1.030)
pH: 9 — ABNORMAL HIGH (ref 5.0–8.0)

## 2020-04-26 LAB — BASIC METABOLIC PANEL
Anion gap: 9 (ref 5–15)
BUN: 5 mg/dL — ABNORMAL LOW (ref 6–20)
CO2: 27 mmol/L (ref 22–32)
Calcium: 9.8 mg/dL (ref 8.9–10.3)
Chloride: 102 mmol/L (ref 98–111)
Creatinine, Ser: 0.87 mg/dL (ref 0.44–1.00)
GFR calc Af Amer: >60 mL/min (ref >60)
GFR calc non Af Amer: >60 mL/min (ref >60)
Glucose, Bld: 141 mg/dL — ABNORMAL HIGH (ref 70–99)
Potassium: 4.2 mmol/L (ref 3.5–5.1)
Sodium: 138 mmol/L (ref 135–145)

## 2020-04-26 LAB — CBC
HCT: 36 % (ref 36.0–46.0)
Hemoglobin: 11.5 g/dL — ABNORMAL LOW (ref 12.0–15.0)
MCH: 28.4 pg (ref 26.0–34.0)
MCHC: 31.9 g/dL (ref 30.0–36.0)
MCV: 88.9 fL (ref 80.0–100.0)
Platelets: 141 10*3/uL — ABNORMAL LOW (ref 150–400)
RBC: 4.05 MIL/uL (ref 3.87–5.11)
RDW: 14.2 % (ref 11.5–15.5)
WBC: 7.3 10*3/uL (ref 4.0–10.5)
nRBC: 0 % (ref 0.0–0.2)

## 2020-04-26 LAB — GLUCOSE, CAPILLARY
Glucose-Capillary: 135 mg/dL — ABNORMAL HIGH (ref 70–99)
Glucose-Capillary: 132 mg/dL — ABNORMAL HIGH (ref 70–99)
Glucose-Capillary: 140 mg/dL — ABNORMAL HIGH (ref 70–99)
Glucose-Capillary: 108 mg/dL — ABNORMAL HIGH (ref 70–99)

## 2020-04-26 MED ORDER — QUETIAPINE FUMARATE 100 MG PO TABS
100.0000 mg | ORAL_TABLET | Freq: Every day | ORAL | Status: DC
  Administered 2020-04-26: 100 mg via ORAL
  Filled 2020-04-26: qty 1

## 2020-04-26 MED ORDER — ALPRAZOLAM 0.5 MG PO TABS
1.0000 mg | ORAL_TABLET | Freq: Four times a day (QID) | ORAL | Status: DC | PRN
  Administered 2020-04-26 – 2020-04-27 (×3): 1 mg via ORAL
  Filled 2020-04-26: qty 4
  Filled 2020-04-26 (×2): qty 2

## 2020-04-26 NOTE — Progress Notes (Signed)
Patient was IVC's by spouse this admission

## 2020-04-26 NOTE — Progress Notes (Signed)
Four point restraints removed from patient as of 0815 this morning. Safety sitter at bedside as of 0800 this morning. Patient calm and cooperative so far this morning. Will continue to monitor and reassess frequently and update MD as needed.

## 2020-04-26 NOTE — Progress Notes (Signed)
Pt refuses vitals. Gets aggressive verbally with staff and pulls away.

## 2020-04-26 NOTE — Progress Notes (Signed)
Spoke with staff at [facility] in [place], and patient does have a bed offer, but cannot be placed today due to the patient not being out of restraints for 24 hours. States they will hold the bed for the patient and can reassess tomorrow morning. MD and [facility] made aware. Will continue to monitor.

## 2020-04-26 NOTE — Progress Notes (Signed)
Pt not allowing bp to be taken consistently

## 2020-04-26 NOTE — Progress Notes (Addendum)
Restraints have been off since 0815 this morning, patient has been compliant, ate breakfast, took medications, and has been asleep since. No need for restraints. Order discontinued. Sitter still at bedside. [facility] notified RN that patient has a bed offer at [facility]. Will ensure that all paperwork is still in date and in order. Will continue to monitor and assess patient frequently.

## 2020-04-26 NOTE — Progress Notes (Signed)
PROGRESS NOTE    Leslie Lyons  FVC:[id number] DOB: 10/18/1961 DOA: 04/22/2020 PCP: [name], MD    Brief Narrative:  59 year old female admitted to the hospital with paranoia/delusions and bizarre behavior which is progressively getting worse over the past several weeks.  She was evaluated in the emergency room and found to be significantly hypokalemic and was therefore referred for medical admission.  With electrolyte replacement, she has been medically cleared for discharge.  Seen by psychiatry with recommendations for inpatient psychiatry placement.  She is currently awaiting a bed at inpatient psychiatry.   Assessment & Plan:   Principal Problem:   Hypokalemia Active Problems:   Mixed hyperlipidemia   Essential hypertension, benign   Type 2 diabetes mellitus with stage 2 chronic kidney disease, with long-term current use of insulin (HCC)   NASH (nonalcoholic steatohepatitis)   Psychosis ([place])   1. Acute psychosis.  Unclear etiology.  Husband reports that she has been having delusions/hallucination intermittently for the past several weeks.  These have gotten progressively worse.  CT head on admission was noted to be unremarkable.  Urine tox screen positive for cannabis as well as benzodiazepines.  Urinalysis does not show any signs of infection.  She did not have any obvious sources of infection.  TSH is normal.  Ammonia level normal.  Ethanol level is undetectable.  Suspect that psychiatric illnesses main cause of psychosis.  She does not have any focal neurologic deficits.  She is currently on involuntary commitment.  She has required intermittent doses of Ativan for agitation.  Seen by psychiatry with recommendations for inpatient psychiatric placement. 2. Hypokalemia.  She is not on any diuretics.  Potassium level has normalized with replacement magnesium level 1.8. 3. Diabetes.  Blood sugars currently stable.  Continue on sliding scale insulin. 4. Hypertension.   Resume lisinopril 5. History of asthma.  Continue on inhaled steroids and bronchodilators as needed. 6. Fever.  No clear source of infection.  Possibly related to atelectasis.  Check labs, urinalysis, chest x-ray   DVT prophylaxis: Lovenox Code Status: Full code Family Communication: Discussed with husband and sister at the bedside Disposition Plan: Status is: Inpatient  Remains inpatient appropriate because:Altered mental status.  Mental status has not returned to baseline.   Dispo: The patient is from: Home              Anticipated d/c is to: Awaiting inpatient psychiatry placement              Anticipated d/c date is: 1 day              Patient currently is medically stable to d/c.  She is awaiting inpatient psychiatry placement    Consultants:     Procedures:     Antimicrobials:       Subjective: She did have some agitation overnight and was placed in restraints.  She is calm this morning.  Restraints have been removed.  Objective: Vitals:   04/26/20 1500 04/26/20 1600 04/26/20 1700 04/26/20 1805  BP: (!) 102/40 128/68 124/67   Pulse: 91 90 99   Resp: (!) 26 18 (!) 28   Temp:    97.9 F (36.6 C)  TempSrc:    Oral  SpO2: 98% 100% 98%   Weight:      Height:        Intake/Output Summary (Last 24 hours) at 04/26/2020 1941 Last data filed at 04/26/2020 1337 Gross per 24 hour  Intake 240 ml  Output 2400 ml  Net -2160 ml   Filed Weights   04/22/20 1347 04/22/20 2153 04/23/20 0500  Weight: 102 kg 91.9 kg 91.9 kg    Examination:  General exam: Alert, awake, no distress Respiratory system: Clear to auscultation. Respiratory effort normal. Cardiovascular system:RRR. No murmurs, rubs, gallops. Gastrointestinal system: Abdomen is nondistended, soft and nontender. No organomegaly or masses felt. Normal bowel sounds heard. Central nervous system:  No focal neurological deficits. Extremities: No C/C/E, +pedal pulses Skin: No rashes, lesions or  ulcers Psychiatry: Speech is still tangential, but she is more calm    Data Reviewed: I have personally reviewed following labs and imaging studies  CBC: Recent Labs  Lab 04/22/20 1613 04/23/20 0526 04/26/20 1121  WBC 11.5* 7.2 7.3  NEUTROABS 5.7  --   --   HGB 12.0 10.4* 11.5*  HCT 37.0 32.7* 36.0  MCV 87.9 89.6 88.9  PLT 164 199 161*   Basic Metabolic Panel: Recent Labs  Lab 04/22/20 1613 04/23/20 0526 04/24/20 0352 04/26/20 1121  NA 138 139 142 138  K 2.2* 2.8* 3.8 4.2  CL 95* 102 111 102  CO2 25 26 21* 27  GLUCOSE 125* 151* 91 141*  BUN 25* 19 11 5*  CREATININE 1.20* 0.96 0.78 0.87  CALCIUM 9.3 8.4* 8.6* 9.8  MG  --  1.8  --   --   PHOS  --  2.4*  --   --    GFR: Estimated Creatinine Clearance: 75 mL/min (by C-G formula based on SCr of 0.87 mg/dL). Liver Function Tests: Recent Labs  Lab 04/22/20 1613 04/23/20 0526  AST 48* 37  ALT 33 27  ALKPHOS 62 54  BILITOT 1.3* 1.3*  PROT 8.4* 6.6  ALBUMIN 4.9 3.6   No results for input(s): LIPASE, AMYLASE in the last 168 hours. Recent Labs  Lab 04/24/20 0352  AMMONIA 32   Coagulation Profile: No results for input(s): INR, PROTIME in the last 168 hours. Cardiac Enzymes: No results for input(s): CKTOTAL, CKMB, CKMBINDEX, TROPONINI in the last 168 hours. BNP (last 3 results) No results for input(s): PROBNP in the last 8760 hours. HbA1C: No results for input(s): HGBA1C in the last 72 hours. CBG: Recent Labs  Lab 04/25/20 1547 04/25/20 2056 04/26/20 0726 04/26/20 1140 04/26/20 1701  GLUCAP 121* 120* 135* 132* 140*   Lipid Profile: No results for input(s): CHOL, HDL, LDLCALC, TRIG, CHOLHDL, LDLDIRECT in the last 72 hours. Thyroid Function Tests: No results for input(s): TSH, T4TOTAL, FREET4, T3FREE, THYROIDAB in the last 72 hours. Anemia Panel: No results for input(s): VITAMINB12, FOLATE, FERRITIN, TIBC, IRON, RETICCTPCT in the last 72 hours. Sepsis Labs: No results for input(s): PROCALCITON,  LATICACIDVEN in the last 168 hours.  Recent Results (from the past 240 hour(s))  SARS Coronavirus 2 by RT PCR (hospital order, performed in [facility] hospital lab) Nasopharyngeal Nasopharyngeal Swab     Status: None   Collection Time: 04/22/20  8:03 PM   Specimen: Nasopharyngeal Swab  Result Value Ref Range Status   SARS Coronavirus 2 NEGATIVE NEGATIVE Final    Comment: (NOTE) SARS-CoV-2 target nucleic acids are NOT DETECTED. The SARS-CoV-2 RNA is generally detectable in upper and lower respiratory specimens during the acute phase of infection. The lowest concentration of SARS-CoV-2 viral copies this assay can detect is 250 copies / mL. A negative result does not preclude SARS-CoV-2 infection and should not be used as the sole basis for treatment or other patient management decisions.  A negative result may occur with improper specimen  collection / handling, submission of specimen other than nasopharyngeal swab, presence of viral mutation(s) within the areas targeted by this assay, and inadequate number of viral copies (<250 copies / mL). A negative result must be combined with clinical observations, patient history, and epidemiological information. Fact Sheet for Patients:   StrictlyIdeas.no Fact Sheet for Healthcare Providers: BankingDealers.co.za This test is not yet approved or cleared  by the [place] FDA and has been authorized for detection and/or diagnosis of SARS-CoV-2 by FDA under an Emergency Use Authorization (EUA).  This EUA will remain in effect (meaning this test can be used) for the duration of the COVID-19 declaration under Section 564(b)(1) of the Act, 21 U.S.C. section 360bbb-3(b)(1), unless the authorization is terminated or revoked sooner. Performed at [facility], [address] [place], [place] [address]   MRSA PCR Screening     Status: None   Collection Time: 04/22/20  9:37 PM   Specimen: Nasal Mucosa;  Nasopharyngeal  Result Value Ref Range Status   MRSA by PCR NEGATIVE NEGATIVE Final    Comment:        The GeneXpert MRSA Assay (FDA approved for NASAL specimens only), is one component of a comprehensive MRSA colonization surveillance program. It is not intended to diagnose MRSA infection nor to guide or monitor treatment for MRSA infections. Performed at [facility], [address], [place], [place] [address]          Radiology Studies: DG CHEST PORT 1 VIEW  Result Date: 04/26/2020 CLINICAL DATA:  Per pt family member, pt has a fever onset since last night. Pt would not answer questions. Per family members, pt is confused. EXAM: PORTABLE CHEST 1 VIEW COMPARISON:  Chest radiograph 12/22/2011 FINDINGS: Mediastinal contours within normal limits. Heart size appears mildly enlarged which may be accentuated by AP portable technique. The lungs are clear. No pneumothorax or significant pleural effusion. No acute finding in the visualized skeleton. IMPRESSION: No acute cardiopulmonary process. Electronically Signed   By: Audie Pinto M.D.   On: 04/26/2020 12:02        Scheduled Meds: . atorvastatin  20 mg Oral Daily  . Chlorhexidine Gluconate Cloth  6 each Topical Daily  . enoxaparin (LOVENOX) injection  40 mg Subcutaneous Q24H  . insulin aspart  0-20 Units Subcutaneous TID WC  . lisinopril  10 mg Oral Daily  . magnesium oxide  400 mg Oral Once  . mouth rinse  15 mL Mouth Rinse BID  . mometasone-formoterol  2 puff Inhalation BID  . QUEtiapine  100 mg Oral QHS  . topiramate  100 mg Oral BID   Continuous Infusions:    LOS: 3 days    Time spent: 30mns    JKathie Dike MD [facility]   If 7PM-7AM, please contact night-coverage [email or web address]  04/26/2020, 7:41 PM

## 2020-04-27 LAB — BASIC METABOLIC PANEL
Anion gap: 9 (ref 5–15)
BUN: 6 mg/dL (ref 6–20)
CO2: 27 mmol/L (ref 22–32)
Calcium: 9.8 mg/dL (ref 8.9–10.3)
Chloride: 103 mmol/L (ref 98–111)
Creatinine, Ser: 1.08 mg/dL — ABNORMAL HIGH (ref 0.44–1.00)
GFR calc Af Amer: >60 mL/min (ref >60)
GFR calc non Af Amer: 56 mL/min — ABNORMAL LOW (ref >60)
Glucose, Bld: 160 mg/dL — ABNORMAL HIGH (ref 70–99)
Potassium: 4.2 mmol/L (ref 3.5–5.1)
Sodium: 139 mmol/L (ref 135–145)

## 2020-04-27 LAB — CBC
HCT: 37.5 % (ref 36.0–46.0)
Hemoglobin: 11.5 g/dL — ABNORMAL LOW (ref 12.0–15.0)
MCH: 28.3 pg (ref 26.0–34.0)
MCHC: 30.7 g/dL (ref 30.0–36.0)
MCV: 92.1 fL (ref 80.0–100.0)
Platelets: 158 10*3/uL (ref 150–400)
RBC: 4.07 MIL/uL (ref 3.87–5.11)
RDW: 14.6 % (ref 11.5–15.5)
WBC: 7.3 10*3/uL (ref 4.0–10.5)
nRBC: 0 % (ref 0.0–0.2)

## 2020-04-27 LAB — GLUCOSE, CAPILLARY
Glucose-Capillary: 166 mg/dL — ABNORMAL HIGH (ref 70–99)
Glucose-Capillary: 117 mg/dL — ABNORMAL HIGH (ref 70–99)
Glucose-Capillary: 141 mg/dL — ABNORMAL HIGH (ref 70–99)
Glucose-Capillary: 112 mg/dL — ABNORMAL HIGH (ref 70–99)

## 2020-04-27 LAB — LACTIC ACID, PLASMA
Lactic Acid, Venous: 3.1 mmol/L (ref 0.5–1.9)
Lactic Acid, Venous: 2.7 mmol/L (ref 0.5–1.9)

## 2020-04-27 MED ORDER — LACTATED RINGERS IV BOLUS
1000.0000 mL | Freq: Once | INTRAVENOUS | Status: AC
  Administered 2020-04-27: 1000 mL via INTRAVENOUS

## 2020-04-27 MED ORDER — SODIUM CHLORIDE 0.9 % IV SOLN: INTRAVENOUS | Status: DC

## 2020-04-27 MED ORDER — SODIUM CHLORIDE 0.9 % IV SOLN: 250.0000 mL | INTRAVENOUS | Status: DC

## 2020-04-27 MED ORDER — NOREPINEPHRINE 4 MG/250ML-% IV SOLN
2.0000 ug/min | INTRAVENOUS | Status: DC
  Administered 2020-04-27: 2 ug/min via INTRAVENOUS
  Filled 2020-04-27: qty 250

## 2020-04-27 MED ORDER — QUETIAPINE FUMARATE 25 MG PO TABS
50.0000 mg | ORAL_TABLET | Freq: Every day | ORAL | Status: DC
  Administered 2020-04-27: 50 mg via ORAL
  Filled 2020-04-27: qty 2

## 2020-04-27 MED ORDER — ALPRAZOLAM 0.5 MG PO TABS
1.0000 mg | ORAL_TABLET | Freq: Two times a day (BID) | ORAL | Status: DC | PRN
  Administered 2020-04-27 – 2020-04-28 (×2): 1 mg via ORAL
  Filled 2020-04-27: qty 4
  Filled 2020-04-27: qty 2

## 2020-04-27 NOTE — Progress Notes (Signed)
Lama MD notified of BP. Awaiting response and orders at this time

## 2020-04-27 NOTE — Progress Notes (Addendum)
CRITICAL VALUE ALERT  Critical Value:  Lactic Acid 3.1  Date & Time Notied:  04/27/20 @ 1002.  Provider Notified: Roderic Palau, MD.  Orders Received/Actions taken: LR Bolus. Then continue regular fluids.

## 2020-04-27 NOTE — Progress Notes (Signed)
CRITICAL VALUE ALERT  Critical Value:  Lactic Acid 2.7  Date & Time Notied:  04/27/20 @ 1200.  Provider Notified: Roderic Palau, MD.  Orders Received/Actions taken: Continue fluids.

## 2020-04-27 NOTE — Progress Notes (Signed)
PROGRESS NOTE    Leslie Lyons  BCW:[id number] DOB: 31-Jan-1961 DOA: 04/22/2020 PCP: [name], MD    Brief Narrative:  59 year old female admitted to the hospital with paranoia/delusions and bizarre behavior which is progressively getting worse over the past several weeks.  She was evaluated in the emergency room and found to be significantly hypokalemic and was therefore referred for medical admission.  With electrolyte replacement, she has been medically cleared for discharge.  Seen by psychiatry with recommendations for inpatient psychiatry placement.  She is currently awaiting a bed at inpatient psychiatry.   Assessment & Plan:   Principal Problem:   Hypokalemia Active Problems:   Mixed hyperlipidemia   Essential hypertension, benign   Type 2 diabetes mellitus with stage 2 chronic kidney disease, with long-term current use of insulin (HCC)   NASH (nonalcoholic steatohepatitis)   Psychosis ([place])   1. Acute psychosis.  Unclear etiology.  Husband reports that she has been having delusions/hallucination intermittently for the past several weeks.  These have gotten progressively worse.  CT head on admission was noted to be unremarkable.  Urine tox screen positive for cannabis as well as benzodiazepines.  Urinalysis does not show any signs of infection.  She did not have any obvious sources of infection.  TSH is normal.  Ammonia level normal.  Ethanol level is undetectable.  Suspect that psychiatric illnesses main cause of psychosis.  She does not have any focal neurologic deficits.  She is currently on involuntary commitment.  She has required intermittent doses of Ativan for agitation.  Seen by psychiatry with recommendations for inpatient psychiatric placement. 2. Hypokalemia.  She is not on any diuretics.  Potassium level has normalized with replacement and magnesium level 1.8. 3. Diabetes.  Blood sugars currently stable.  Continue on sliding scale insulin. 4. Hypertension.   Lisinopril on hold due to low blood pressures. 5. History of asthma.  Continue on inhaled steroids and bronchodilators as needed. 6. Fever.  No clear source of infection.  Possibly related to atelectasis.  Labs including CBC, BMP, urinalysis as well as chest x-ray unremarkable. 7. Hypotension.  Patient developed transient hypotension and was briefly started on norepinephrine infusion.  Upon evaluation in the morning, she denies any symptoms from side hypotension.  Labs were evaluated that did not show any significant signs of infection or significant rising creatinine.  Norepinephrine was discontinued and follow-up blood pressure still remained stable.  She was started on IV fluids and lactic acid was checked that was mildly elevated at 3.1.  This is since trended down with IV fluids.  She does not appear septic or toxic.  Will monitor another 24 hours to ensure stability.   DVT prophylaxis: Lovenox Code Status: Full code Family Communication: Discussed with sister at the bedside Disposition Plan: Status is: Inpatient  Remains inpatient appropriate because:Altered mental status.  Mental status has not returned to baseline.  Patient was also hypotensive overnight briefly requiring norepinephrine infusion.  We will monitor another 24 hours to ensure stability of hemodynamics prior to discharge.   Dispo: The patient is from: Home              Anticipated d/c is to: Awaiting inpatient psychiatry placement              Anticipated d/c date is: 1 day              Patient currently is medically stable to d/c.  She is awaiting inpatient psychiatry placement    Consultants:  Procedures:     Antimicrobials:       Subjective: Events from overnight noted.  Per records, patient had developed hypotension overnight.  It is unclear if she was symptomatic.  She denies any lightheadedness, dizziness, chest pain or shortness of breath.  Patient was started back on IV fluids, it is unclear whether  she received any fluid boluses.  She was also started on norepinephrine infusion for persistent hypotension.  She remained afebrile.  Objective: Vitals:   04/27/20 1600 04/27/20 1630 04/27/20 1700 04/27/20 1730  BP: 120/70 (!) 130/93 132/84 (!) 155/48  Pulse: (!) 104 (!) 102 (!) 107 (!) 103  Resp: (!) 30 19 16 18   Temp: 98.4 F (36.9 C)     TempSrc: Oral     SpO2: 97% 100% 98% 100%  Weight:      Height:        Intake/Output Summary (Last 24 hours) at 04/27/2020 1843 Last data filed at 04/27/2020 1501 Gross per 24 hour  Intake 2581.3 ml  Output 3200 ml  Net -618.7 ml   Filed Weights   04/22/20 2153 04/23/20 0500 04/27/20 0500  Weight: 91.9 kg 91.9 kg 92.8 kg    Examination:  General exam: Alert, awake, no distress Respiratory system: Clear to auscultation. Respiratory effort normal. Cardiovascular system:RRR. No murmurs, rubs, gallops. Gastrointestinal system: Abdomen is nondistended, soft and nontender. No organomegaly or masses felt. Normal bowel sounds heard. Central nervous system: No focal neurological deficits. Extremities: No C/C/E, +pedal pulses Skin: No rashes, lesions or ulcers Psychiatry: Still paranoid, but seems more calm.   Data Reviewed: I have personally reviewed following labs and imaging studies  CBC: Recent Labs  Lab 04/22/20 1613 04/23/20 0526 04/26/20 1121 04/27/20 0916  WBC 11.5* 7.2 7.3 7.3  NEUTROABS 5.7  --   --   --   HGB 12.0 10.4* 11.5* 11.5*  HCT 37.0 32.7* 36.0 37.5  MCV 87.9 89.6 88.9 92.1  PLT 164 199 141* 834   Basic Metabolic Panel: Recent Labs  Lab 04/22/20 1613 04/23/20 0526 04/24/20 0352 04/26/20 1121 04/27/20 0916  NA 138 139 142 138 139  K 2.2* 2.8* 3.8 4.2 4.2  CL 95* 102 111 102 103  CO2 25 26 21* 27 27  GLUCOSE 125* 151* 91 141* 160*  BUN 25* 19 11 5* 6  CREATININE 1.20* 0.96 0.78 0.87 1.08*  CALCIUM 9.3 8.4* 8.6* 9.8 9.8  MG  --  1.8  --   --   --   PHOS  --  2.4*  --   --   --    GFR: Estimated  Creatinine Clearance: 60.7 mL/min (A) (by C-G formula based on SCr of 1.08 mg/dL (H)). Liver Function Tests: Recent Labs  Lab 04/22/20 1613 04/23/20 0526  AST 48* 37  ALT 33 27  ALKPHOS 62 54  BILITOT 1.3* 1.3*  PROT 8.4* 6.6  ALBUMIN 4.9 3.6   No results for input(s): LIPASE, AMYLASE in the last 168 hours. Recent Labs  Lab 04/24/20 0352  AMMONIA 32   Coagulation Profile: No results for input(s): INR, PROTIME in the last 168 hours. Cardiac Enzymes: No results for input(s): CKTOTAL, CKMB, CKMBINDEX, TROPONINI in the last 168 hours. BNP (last 3 results) No results for input(s): PROBNP in the last 8760 hours. HbA1C: No results for input(s): HGBA1C in the last 72 hours. CBG: Recent Labs  Lab 04/26/20 1701 04/26/20 2106 04/27/20 0722 04/27/20 1058 04/27/20 1605  GLUCAP 140* 108* 166* 117* 141*   Lipid  Profile: No results for input(s): CHOL, HDL, LDLCALC, TRIG, CHOLHDL, LDLDIRECT in the last 72 hours. Thyroid Function Tests: No results for input(s): TSH, T4TOTAL, FREET4, T3FREE, THYROIDAB in the last 72 hours. Anemia Panel: No results for input(s): VITAMINB12, FOLATE, FERRITIN, TIBC, IRON, RETICCTPCT in the last 72 hours. Sepsis Labs: Recent Labs  Lab 04/27/20 0916 04/27/20 1133  LATICACIDVEN 3.1* 2.7*    Recent Results (from the past 240 hour(s))  SARS Coronavirus 2 by RT PCR (hospital order, performed in [facility] hospital lab) Nasopharyngeal Nasopharyngeal Swab     Status: None   Collection Time: 04/22/20  8:03 PM   Specimen: Nasopharyngeal Swab  Result Value Ref Range Status   SARS Coronavirus 2 NEGATIVE NEGATIVE Final    Comment: (NOTE) SARS-CoV-2 target nucleic acids are NOT DETECTED. The SARS-CoV-2 RNA is generally detectable in upper and lower respiratory specimens during the acute phase of infection. The lowest concentration of SARS-CoV-2 viral copies this assay can detect is 250 copies / mL. A negative result does not preclude SARS-CoV-2 infection  and should not be used as the sole basis for treatment or other patient management decisions.  A negative result may occur with improper specimen collection / handling, submission of specimen other than nasopharyngeal swab, presence of viral mutation(s) within the areas targeted by this assay, and inadequate number of viral copies (<250 copies / mL). A negative result must be combined with clinical observations, patient history, and epidemiological information. Fact Sheet for Patients:   StrictlyIdeas.no Fact Sheet for Healthcare Providers: BankingDealers.co.za This test is not yet approved or cleared  by the [place] FDA and has been authorized for detection and/or diagnosis of SARS-CoV-2 by FDA under an Emergency Use Authorization (EUA).  This EUA will remain in effect (meaning this test can be used) for the duration of the COVID-19 declaration under Section 564(b)(1) of the Act, 21 U.S.C. section 360bbb-3(b)(1), unless the authorization is terminated or revoked sooner. Performed at [facility], [address], [place], [place] [address]   MRSA PCR Screening     Status: None   Collection Time: 04/22/20  9:37 PM   Specimen: Nasal Mucosa; Nasopharyngeal  Result Value Ref Range Status   MRSA by PCR NEGATIVE NEGATIVE Final    Comment:        The GeneXpert MRSA Assay (FDA approved for NASAL specimens only), is one component of a comprehensive MRSA colonization surveillance program. It is not intended to diagnose MRSA infection nor to guide or monitor treatment for MRSA infections. Performed at [facility], [address], [place], [place] [address]          Radiology Studies: DG CHEST PORT 1 VIEW  Result Date: 04/26/2020 CLINICAL DATA:  Per pt family member, pt has a fever onset since last night. Pt would not answer questions. Per family members, pt is confused. EXAM: PORTABLE CHEST 1 VIEW COMPARISON:  Chest radiograph  12/22/2011 FINDINGS: Mediastinal contours within normal limits. Heart size appears mildly enlarged which may be accentuated by AP portable technique. The lungs are clear. No pneumothorax or significant pleural effusion. No acute finding in the visualized skeleton. IMPRESSION: No acute cardiopulmonary process. Electronically Signed   By: Audie Pinto M.D.   On: 04/26/2020 12:02        Scheduled Meds: . atorvastatin  20 mg Oral Daily  . Chlorhexidine Gluconate Cloth  6 each Topical Daily  . enoxaparin (LOVENOX) injection  40 mg Subcutaneous Q24H  . insulin aspart  0-20 Units Subcutaneous TID WC  .  magnesium oxide  400 mg Oral Once  . mouth rinse  15 mL Mouth Rinse BID  . mometasone-formoterol  2 puff Inhalation BID  . QUEtiapine  100 mg Oral QHS  . topiramate  100 mg Oral BID   Continuous Infusions: . sodium chloride 125 mL/hr at 04/27/20 1501  . sodium chloride    . norepinephrine (LEVOPHED) Adult infusion Stopped (04/27/20 0824)     LOS: 4 days    Time spent: 63mns    JKathie Dike MD [facility]   If 7PM-7AM, please contact night-coverage [email or web address]  04/27/2020, 6:43 PM

## 2020-04-27 NOTE — BH Assessment (Signed)
Reassessment Note: Pt present lying in hospital bed with her eyes closed. She makes repetitive movements with hands, legs, arms and also states "blessed" over and over. Pt did not interact with this counselor, other than wide smile when I stated it was nice her son was with her. Daughter is also present and by pt's side. Daughter lives in [place] and is upset that no one living with pt alerted her sooner of pt's condition so help could have been sought sooner.

## 2020-04-27 NOTE — Progress Notes (Addendum)
Spoke with an admissions team member from [facility] where patient is to be discharged to and updated them on patient's health and updated on plan of care that has occurred since speaking with them yesterday. Told them MD wanted to watch patient for another 24 hours. [facility] staff member stated that was fine and they would hold the bed for the patient. MD made aware. Will continue to monitor.   Phone number for [facility] is [phone].

## 2020-04-27 NOTE — Progress Notes (Signed)
Lama MD notified of BP. Awaiting response and orders at this time.

## 2020-04-27 NOTE — Progress Notes (Signed)
Blood pressures have steadily been rising since 0730. After speaking with MD, verbal order to stop the Levophed and see how blood pressure's react. Hopeful that patient can be discharged this afternoon. Will continue to monitor.

## 2020-04-27 NOTE — Progress Notes (Signed)
Checked on patient at 2000 to give inhaler dulera patient was eating. At 2200 patient is asleep. Patient is having behavioral issues and what she is being treated. Inhaler is being administered as patient will allow.

## 2020-04-28 LAB — BASIC METABOLIC PANEL
Anion gap: 6 (ref 5–15)
BUN: 8 mg/dL (ref 6–20)
CO2: 28 mmol/L (ref 22–32)
Calcium: 9.6 mg/dL (ref 8.9–10.3)
Chloride: 106 mmol/L (ref 98–111)
Creatinine, Ser: 0.94 mg/dL (ref 0.44–1.00)
GFR calc Af Amer: >60 mL/min (ref >60)
GFR calc non Af Amer: >60 mL/min (ref >60)
Glucose, Bld: 132 mg/dL — ABNORMAL HIGH (ref 70–99)
Potassium: 4.4 mmol/L (ref 3.5–5.1)
Sodium: 140 mmol/L (ref 135–145)

## 2020-04-28 LAB — GLUCOSE, CAPILLARY
Glucose-Capillary: 116 mg/dL — ABNORMAL HIGH (ref 70–99)
Glucose-Capillary: 139 mg/dL — ABNORMAL HIGH (ref 70–99)

## 2020-04-28 LAB — LACTIC ACID, PLASMA: Lactic Acid, Venous: 1.8 mmol/L (ref 0.5–1.9)

## 2020-04-28 NOTE — BHH Counselor (Signed)
Received a call from charge nurse-Tiffany. States that their is some confusion about wether or not patient's bed is available at [facility]. Their was discussion that [facility] may have given the bed away because patient was in restraints. Clinician contacted [facility] to obtain clarity about patients bed status. Spoke to-Sylvia (nursing staff) and was informed that patient does have a bed available. Sunday Spillers is requesting that someone call her to complete a nurse to nurse report.   Clinician has reached out to charge nurse-Tiffany to make her aware that patient's bed is available. Also,  to request that the nurse report is completed.   Sunday Spillers was also made aware that patient is in route to their facility. Sunday Spillers was ok with this and states that patient's bed will be ready once she arrives to their facility.

## 2020-04-28 NOTE — Care Management Important Message (Signed)
Important Message  Patient Details  Name: Leslie Lyons MRN: [id number] Date of Birth: [birth year]   Medicare Important Message Given:  Yes     Tommy Medal 04/28/2020, 10:57 AM

## 2020-04-28 NOTE — Progress Notes (Signed)
After many phone calls back and forth to behavioral health and to [facility], [facility] has given her a bed and report was given. Patient discharged.

## 2020-04-28 NOTE — Progress Notes (Signed)
Patient was to be discharged to [facility] in [place] for behavioral health admission. This RN called earlier around 0800AM to verify patient still had a bed. This RN was told by [facility] that patient still had a bed there and was ready for report. After getting patient ready for discharge, and being discharged with a Teaching laboratory technician, this RN called to give report around 1150AM and the admissions receptionist stated the patient's bed was given away due to someone calling from [facility] and alerting them that the patient had been in restraints. Patient has been out of restraints since 5/29 @ 0815AM. *See note entered by another RN about the restraints being removed.* Behavioral health hospital was called to make them aware that the patient's bed was given up due to someone calling [facility] stating patient was in restraints but this was untrue. The [facility] at [facility] stated she would let others know so it would not happen again. [place] PD was called to tell them to bring patient back to [facility] ICU Room 02 to hold patient until patient gets a bed tomorrow. Discharge taken out by Patient placement.

## 2020-04-28 NOTE — Discharge Summary (Signed)
Physician Discharge Summary  Leslie Lyons VEL:[id number] DOB: Apr 07, 1961 DOA: 04/22/2020  PCP: [name], MD  Admit date: 04/22/2020 Discharge date: 04/28/2020  Admitted From: home Disposition:  Inpatient Psychiatry  Recommendations for Outpatient Follow-up:  1. Patient will be discharged to [facility] facility in [place] for further care 2. Glipizide and lantus have been held since blood sugars have been in acceptable range without them 3. Lisinopril held since she is normotensive  Discharge Condition:stable CODE STATUS:full code Diet recommendation: heart healthy, carb modified  Brief/Interim Summary: 59 year old female admitted to the hospital with paranoia/delusions and bizarre behavior which is progressively getting worse over the past several weeks.  She was evaluated in the emergency room and found to be significantly hypokalemic and was therefore referred for medical admission.  With electrolyte replacement, she has been medically cleared for discharge.  Seen by psychiatry with recommendations for inpatient psychiatry placement.   Discharge Diagnoses:  Principal Problem:   Hypokalemia Active Problems:   Mixed hyperlipidemia   Essential hypertension, benign   Type 2 diabetes mellitus with stage 2 chronic kidney disease, with long-term current use of insulin (HCC)   NASH (nonalcoholic steatohepatitis)   Psychosis ([name])  1. Acute psychosis.  Unclear etiology.  Husband reports that she has been having delusions/hallucination intermittently for the past several weeks.  These have gotten progressively worse.  CT head on admission was noted to be unremarkable.  Urine tox screen positive for cannabis as well as benzodiazepines.  Urinalysis does not show any signs of infection.  She did not have any obvious sources of infection.  TSH is normal.  Ammonia level normal.  Ethanol level is undetectable.  Suspect that underlying psychiatric illness is main cause of  psychosis.  She does not have any focal neurologic deficits.  She is currently on involuntary commitment.  She has required intermittent doses of Ativan for agitation.  Seen by psychiatry with recommendations for inpatient psychiatric placement. 2. Hypokalemia.  She was not on any diuretics.  Potassium level has normalized with replacement and magnesium level 1.8. 3. Diabetes.  Blood sugars currently stable.  Lantus and glipizide held on admission and blood sugars remained stable. Would continue to follow serial blood sugars 4. Hypertension.  Lisinopril on hold due to low blood pressures. Currently normotensive. 5. History of asthma.  Continue on inhaled steroids and bronchodilators as needed. 6. Fever.  No clear source of infection.  Possibly related to atelectasis.  Labs including CBC, BMP, urinalysis as well as chest x-ray unremarkable. She has been afebrile for 48 hours.  Discharge Instructions  Discharge Instructions    Diet - low sodium heart healthy   Complete by: As directed    Increase activity slowly   Complete by: As directed      Allergies as of 04/28/2020      Reactions   Janumet [sitagliptin-metformin Hcl]    States she vomits up the whole pill after taking this   Robaxin [methocarbamol] Swelling      Medication List    STOP taking these medications   DULoxetine 60 MG capsule Commonly known as: CYMBALTA   glipiZIDE 10 MG 24 hr tablet Commonly known as: GLUCOTROL XL   insulin glargine 100 UNIT/ML Solostar Pen Commonly known as: Lantus SoloStar   lisinopril 10 MG tablet Commonly known as: ZESTRIL     TAKE these medications   albuterol 108 (90 Base) MCG/ACT inhaler Commonly known as: ProAir HFA Inhale 2 puffs into the lungs every 6 (six) hours as  needed.   ALPRAZolam 1 MG tablet Commonly known as: XANAX Take one tablet by mouth two times daily for anxiety   atorvastatin 20 MG tablet Commonly known as: LIPITOR Take 1 tablet (20 mg total) by mouth daily.    ezetimibe 10 MG tablet Commonly known as: ZETIA Take 1 tablet (10 mg total) by mouth daily.   mometasone 50 MCG/ACT nasal spray Commonly known as: Nasonex Place 2 sprays into the nose daily.   mometasone-formoterol 200-5 MCG/ACT Aero Commonly known as: Dulera Inhale 2 puffs into the lungs 2 (two) times daily.   tolterodine 2 MG 24 hr capsule Commonly known as: Detrol LA Take 1 capsule (2 mg total) by mouth daily. What changed: additional instructions   topiramate 100 MG tablet Commonly known as: TOPAMAX Take 100 mg by mouth 2 (two) times daily.   Vitamin D3 125 MCG (5000 UT) Caps Take 1 capsule (5,000 Units total) by mouth daily.       Allergies  Allergen Reactions  . Janumet [Sitagliptin-Metformin Hcl]     States she vomits up the whole pill after taking this  . Robaxin [Methocarbamol] Swelling    Consultations:  Behavioral health   Procedures/Studies: CT Head Wo Contrast  Result Date: 04/23/2020 CLINICAL DATA:  Altered mental status for 1 day. Combative. No reported injury EXAM: CT HEAD WITHOUT CONTRAST TECHNIQUE: Contiguous axial images were obtained from the base of the skull through the vertex without intravenous contrast. COMPARISON:  12/12/2012 head CT. FINDINGS: Brain: Motion degraded scan, limiting assessment. No evidence of parenchymal hemorrhage or extra-axial fluid collection. No mass lesion, mass effect, or midline shift. No definite CT evidence of acute infarction. Cerebral volume is age appropriate. No ventriculomegaly. Vascular: No acute abnormality. Skull: No evidence of calvarial fracture. Sinuses/Orbits: The visualized paranasal sinuses are essentially clear. Other:  The mastoid air cells are unopacified. IMPRESSION: Limited motion degraded scan. No definite evidence of acute intracranial abnormality. Electronically Signed   By: Ilona Sorrel M.D.   On: 04/23/2020 11:02   DG CHEST PORT 1 VIEW  Result Date: 04/26/2020 CLINICAL DATA:  Per pt family  member, pt has a fever onset since last night. Pt would not answer questions. Per family members, pt is confused. EXAM: PORTABLE CHEST 1 VIEW COMPARISON:  Chest radiograph 12/22/2011 FINDINGS: Mediastinal contours within normal limits. Heart size appears mildly enlarged which may be accentuated by AP portable technique. The lungs are clear. No pneumothorax or significant pleural effusion. No acute finding in the visualized skeleton. IMPRESSION: No acute cardiopulmonary process. Electronically Signed   By: Audie Pinto M.D.   On: 04/26/2020 12:02       Subjective: Patient is calm, shortness of breath or any other complaints.  Discharge Exam: Vitals:   04/28/20 0400 04/28/20 0500 04/28/20 0600 04/28/20 0722  BP: (!) 106/57 111/67 (!) 103/58 114/68  Pulse: 94 89 85 (!) 119  Resp: (!) 22 19 17  (!) 21  Temp: 98.2 F (36.8 C)   98.2 F (36.8 C)  TempSrc: Axillary   Oral  SpO2: 99% 100% 97% 100%  Weight: 98.1 kg     Height:        General: Pt is alert, awake, not in acute distress Cardiovascular: RRR, S1/S2 +, no rubs, no gallops Respiratory: CTA bilaterally, no wheezing, no rhonchi Abdominal: Soft, NT, ND, bowel sounds + Extremities: no edema, no cyanosis    The results of significant diagnostics from this hospitalization (including imaging, microbiology, ancillary and laboratory) are listed below for reference.  Microbiology: Recent Results (from the past 240 hour(s))  SARS Coronavirus 2 by RT PCR (hospital order, performed in [facility] hospital lab) Nasopharyngeal Nasopharyngeal Swab     Status: None   Collection Time: 04/22/20  8:03 PM   Specimen: Nasopharyngeal Swab  Result Value Ref Range Status   SARS Coronavirus 2 NEGATIVE NEGATIVE Final    Comment: (NOTE) SARS-CoV-2 target nucleic acids are NOT DETECTED. The SARS-CoV-2 RNA is generally detectable in upper and lower respiratory specimens during the acute phase of infection. The lowest concentration of  SARS-CoV-2 viral copies this assay can detect is 250 copies / mL. A negative result does not preclude SARS-CoV-2 infection and should not be used as the sole basis for treatment or other patient management decisions.  A negative result may occur with improper specimen collection / handling, submission of specimen other than nasopharyngeal swab, presence of viral mutation(s) within the areas targeted by this assay, and inadequate number of viral copies (<250 copies / mL). A negative result must be combined with clinical observations, patient history, and epidemiological information. Fact Sheet for Patients:   StrictlyIdeas.no Fact Sheet for Healthcare Providers: BankingDealers.co.za This test is not yet approved or cleared  by the [place] FDA and has been authorized for detection and/or diagnosis of SARS-CoV-2 by FDA under an Emergency Use Authorization (EUA).  This EUA will remain in effect (meaning this test can be used) for the duration of the COVID-19 declaration under Section 564(b)(1) of the Act, 21 U.S.C. section 360bbb-3(b)(1), unless the authorization is terminated or revoked sooner. Performed at [facility], [address], [place], [place] [address]   MRSA PCR Screening     Status: None   Collection Time: 04/22/20  9:37 PM   Specimen: Nasal Mucosa; Nasopharyngeal  Result Value Ref Range Status   MRSA by PCR NEGATIVE NEGATIVE Final    Comment:        The GeneXpert MRSA Assay (FDA approved for NASAL specimens only), is one component of a comprehensive MRSA colonization surveillance program. It is not intended to diagnose MRSA infection nor to guide or monitor treatment for MRSA infections. Performed at [facility], [address] [place], [place] [address]      Labs: BNP (last 3 results) No results for input(s): BNP in the last 8760 hours. Basic Metabolic Panel: Recent Labs  Lab 04/23/20 0526 04/24/20 0352  04/26/20 1121 04/27/20 0916 04/28/20 0446  NA 139 142 138 139 140  K 2.8* 3.8 4.2 4.2 4.4  CL 102 111 102 103 106  CO2 26 21* 27 27 28   GLUCOSE 151* 91 141* 160* 132*  BUN 19 11 5* 6 8  CREATININE 0.96 0.78 0.87 1.08* 0.94  CALCIUM 8.4* 8.6* 9.8 9.8 9.6  MG 1.8  --   --   --   --   PHOS 2.4*  --   --   --   --    Liver Function Tests: Recent Labs  Lab 04/22/20 1613 04/23/20 0526  AST 48* 37  ALT 33 27  ALKPHOS 62 54  BILITOT 1.3* 1.3*  PROT 8.4* 6.6  ALBUMIN 4.9 3.6   No results for input(s): LIPASE, AMYLASE in the last 168 hours. Recent Labs  Lab 04/24/20 0352  AMMONIA 32   CBC: Recent Labs  Lab 04/22/20 1613 04/23/20 0526 04/26/20 1121 04/27/20 0916  WBC 11.5* 7.2 7.3 7.3  NEUTROABS 5.7  --   --   --   HGB 12.0 10.4* 11.5* 11.5*  HCT  37.0 32.7* 36.0 37.5  MCV 87.9 89.6 88.9 92.1  PLT 164 199 141* 158   Cardiac Enzymes: No results for input(s): CKTOTAL, CKMB, CKMBINDEX, TROPONINI in the last 168 hours. BNP: Invalid input(s): POCBNP CBG: Recent Labs  Lab 04/27/20 0722 04/27/20 1058 04/27/20 1605 04/27/20 2107 04/28/20 0720  GLUCAP 166* 117* 141* 112* 116*   D-Dimer No results for input(s): DDIMER in the last 72 hours. Hgb A1c No results for input(s): HGBA1C in the last 72 hours. Lipid Profile No results for input(s): CHOL, HDL, LDLCALC, TRIG, CHOLHDL, LDLDIRECT in the last 72 hours. Thyroid function studies No results for input(s): TSH, T4TOTAL, T3FREE, THYROIDAB in the last 72 hours.  Invalid input(s): FREET3 Anemia work up No results for input(s): VITAMINB12, FOLATE, FERRITIN, TIBC, IRON, RETICCTPCT in the last 72 hours. Urinalysis    Component Value Date/Time   COLORURINE STRAW (A) 04/26/2020 1143   APPEARANCEUR CLEAR 04/26/2020 1143   LABSPEC 1.008 04/26/2020 1143   PHURINE 9.0 (H) 04/26/2020 1143   GLUCOSEU NEGATIVE 04/26/2020 1143   HGBUR NEGATIVE 04/26/2020 1143   BILIRUBINUR NEGATIVE 04/26/2020 1143   BILIRUBINUR neg  11/13/2014 1638   KETONESUR NEGATIVE 04/26/2020 1143   PROTEINUR NEGATIVE 04/26/2020 1143   UROBILINOGEN 0.2 11/13/2014 1638   UROBILINOGEN 0.2 12/12/2012 0235   NITRITE NEGATIVE 04/26/2020 1143   LEUKOCYTESUR NEGATIVE 04/26/2020 1143   Sepsis Labs Invalid input(s): PROCALCITONIN,  WBC,  LACTICIDVEN Microbiology Recent Results (from the past 240 hour(s))  SARS Coronavirus 2 by RT PCR (hospital order, performed in [place] hospital lab) Nasopharyngeal Nasopharyngeal Swab     Status: None   Collection Time: 04/22/20  8:03 PM   Specimen: Nasopharyngeal Swab  Result Value Ref Range Status   SARS Coronavirus 2 NEGATIVE NEGATIVE Final    Comment: (NOTE) SARS-CoV-2 target nucleic acids are NOT DETECTED. The SARS-CoV-2 RNA is generally detectable in upper and lower respiratory specimens during the acute phase of infection. The lowest concentration of SARS-CoV-2 viral copies this assay can detect is 250 copies / mL. A negative result does not preclude SARS-CoV-2 infection and should not be used as the sole basis for treatment or other patient management decisions.  A negative result may occur with improper specimen collection / handling, submission of specimen other than nasopharyngeal swab, presence of viral mutation(s) within the areas targeted by this assay, and inadequate number of viral copies (<250 copies / mL). A negative result must be combined with clinical observations, patient history, and epidemiological information. Fact Sheet for Patients:   StrictlyIdeas.no Fact Sheet for Healthcare Providers: BankingDealers.co.za This test is not yet approved or cleared  by the [place] FDA and has been authorized for detection and/or diagnosis of SARS-CoV-2 by FDA under an Emergency Use Authorization (EUA).  This EUA will remain in effect (meaning this test can be used) for the duration of the COVID-19 declaration under Section  564(b)(1) of the Act, 21 U.S.C. section 360bbb-3(b)(1), unless the authorization is terminated or revoked sooner. Performed at [facility], [address], [place], [place] [address]   MRSA PCR Screening     Status: None   Collection Time: 04/22/20  9:37 PM   Specimen: Nasal Mucosa; Nasopharyngeal  Result Value Ref Range Status   MRSA by PCR NEGATIVE NEGATIVE Final    Comment:        The GeneXpert MRSA Assay (FDA approved for NASAL specimens only), is one component of a comprehensive MRSA colonization surveillance program. It is not intended to diagnose MRSA infection  nor to guide or monitor treatment for MRSA infections. Performed at [facility], [address], [place], [place] [address]      Time coordinating discharge: 99mns  SIGNED:   JKathie Dike MD  [facility] 04/28/2020, 8:13 AM   If 7PM-7AM, please contact night-coverage [email or web address]

## 2020-04-29 DIAGNOSIS — Z794 Long term (current) use of insulin: Secondary | ICD-10-CM | POA: Diagnosis not present

## 2020-04-29 DIAGNOSIS — E7849 Other hyperlipidemia: Secondary | ICD-10-CM | POA: Diagnosis not present

## 2020-04-29 DIAGNOSIS — I10 Essential (primary) hypertension: Secondary | ICD-10-CM | POA: Diagnosis not present

## 2020-04-29 DIAGNOSIS — J449 Chronic obstructive pulmonary disease, unspecified: Secondary | ICD-10-CM | POA: Diagnosis not present

## 2020-04-29 DIAGNOSIS — E119 Type 2 diabetes mellitus without complications: Secondary | ICD-10-CM | POA: Diagnosis not present

## 2020-05-08 DIAGNOSIS — N3080 Other cystitis without hematuria: Secondary | ICD-10-CM | POA: Diagnosis not present

## 2020-05-08 DIAGNOSIS — R4182 Altered mental status, unspecified: Secondary | ICD-10-CM | POA: Diagnosis not present

## 2020-05-14 ENCOUNTER — Ambulatory Visit: Payer: Medicare Other | Admitting: "Endocrinology

## 2020-05-20 ENCOUNTER — Encounter: Payer: Self-pay | Admitting: Family Medicine

## 2020-05-20 ENCOUNTER — Other Ambulatory Visit: Payer: Self-pay

## 2020-05-20 ENCOUNTER — Ambulatory Visit (INDEPENDENT_AMBULATORY_CARE_PROVIDER_SITE_OTHER): Payer: Medicare Other | Admitting: Family Medicine

## 2020-05-20 VITALS — BP 126/77 | HR 98 | Temp 99.1°F | Resp 16 | Ht 63.0 in | Wt 208.0 lb

## 2020-05-20 DIAGNOSIS — Z23 Encounter for immunization: Secondary | ICD-10-CM | POA: Diagnosis not present

## 2020-05-20 DIAGNOSIS — F4323 Adjustment disorder with mixed anxiety and depressed mood: Secondary | ICD-10-CM

## 2020-05-20 DIAGNOSIS — Z09 Encounter for follow-up examination after completed treatment for conditions other than malignant neoplasm: Secondary | ICD-10-CM

## 2020-05-20 MED ORDER — LISINOPRIL 10 MG PO TABS: 10.0000 mg | ORAL_TABLET | Freq: Every day | ORAL | 2 refills | Status: DC

## 2020-05-20 MED ORDER — TOPIRAMATE 100 MG PO TABS: 100.0000 mg | ORAL_TABLET | Freq: Two times a day (BID) | ORAL | 2 refills | Status: DC

## 2020-05-20 MED ORDER — GLIPIZIDE ER 10 MG PO TB24: 10.0000 mg | ORAL_TABLET | Freq: Every day | ORAL | 2 refills | Status: DC

## 2020-05-20 MED ORDER — RISPERIDONE 4 MG PO TABS: 4.0000 mg | ORAL_TABLET | Freq: Every day | ORAL | 2 refills | Status: DC

## 2020-05-20 NOTE — Patient Instructions (Signed)
F/u with MD in 6 weeks, call if you need me sooner  You are referred to [facility] to both therapy and Psychiatry.  Please continue to take all medication as prescribed. All needing to be refilled will be refilled for 2 months  Please get covid vaccine today or in the next 1 week, at your pharmacy  Thankful you are better than when hospitalized  Contact will be made to Christell Faith oper her request  Thanks for choosing [facility], we consider it a privelige to serve you.

## 2020-05-26 ENCOUNTER — Telehealth: Payer: Self-pay

## 2020-05-26 NOTE — Telephone Encounter (Signed)
Pt called and LMOM something about [employer] - I called her back to get information and no answer - lmom

## 2020-05-27 ENCOUNTER — Telehealth: Payer: Self-pay

## 2020-05-27 ENCOUNTER — Other Ambulatory Visit: Payer: Self-pay

## 2020-05-27 MED ORDER — DULERA 200-5 MCG/ACT IN AERO: 2.0000 | INHALATION_SPRAY | Freq: Two times a day (BID) | RESPIRATORY_TRACT | 2 refills | Status: DC

## 2020-05-27 NOTE — Telephone Encounter (Signed)
Dulera refilled

## 2020-05-27 NOTE — Telephone Encounter (Signed)
Pt needs her Asthma Pump called in --She didn't know the name

## 2020-05-28 ENCOUNTER — Telehealth: Payer: Self-pay

## 2020-05-28 ENCOUNTER — Encounter: Payer: Self-pay | Admitting: Family Medicine

## 2020-05-28 ENCOUNTER — Telehealth: Payer: Self-pay | Admitting: Family Medicine

## 2020-05-28 ENCOUNTER — Other Ambulatory Visit: Payer: Self-pay

## 2020-05-28 DIAGNOSIS — Z09 Encounter for follow-up examination after completed treatment for conditions other than malignant neoplasm: Secondary | ICD-10-CM | POA: Insufficient documentation

## 2020-05-28 MED ORDER — HYDROXYZINE PAMOATE 25 MG PO CAPS: ORAL_CAPSULE | ORAL | 2 refills | Status: DC

## 2020-05-28 MED ORDER — OXYBUTYNIN CHLORIDE 5 MG PO TABS: 5.0000 mg | ORAL_TABLET | Freq: Two times a day (BID) | ORAL | 3 refills | Status: DC

## 2020-05-28 MED ORDER — ALBUTEROL SULFATE HFA 108 (90 BASE) MCG/ACT IN AERS: 2.0000 | INHALATION_SPRAY | Freq: Four times a day (QID) | RESPIRATORY_TRACT | 3 refills | Status: DC | PRN

## 2020-05-28 NOTE — Assessment & Plan Note (Signed)
Patient in for follow up of recent hospitalization. Discharge summary, and laboratory and radiology data are reviewed, and any questions or concerns about recent hospitalization are discussed. Specific issues requiring follow up are specifically addressed.  

## 2020-05-28 NOTE — Assessment & Plan Note (Signed)
Recent in patient hospitalization at [facility] from May 31 to 05/15/2020. Needs follow up with local psychiatry.asap Current meds are Risperidone and Hydroxyzine

## 2020-05-28 NOTE — Telephone Encounter (Signed)
States she did not need to speak with anyone and sounded very confused and did not know about the call request

## 2020-05-28 NOTE — Progress Notes (Signed)
    Leslie Lyons     MRN: [id number]      DOB: 1961/04/18   HPI [name] is here for follow-up of recent hospitalization at [facility].  She was admitted on May 31 and discharged on June 17 for mental health reasons.  Just prior to this she had been admitted from May 25 to May 31 of regular hospital primary diagnosis of hypokalemia. She presented on May 25 with paranoid ideation delusions and very slight behavior which had progressively worsened over several weeks.  In the emergency room she was found to be significantly helpful. She was admitted to the medical floor for stabilization before transfer to the psychiatric unit for care.  Patient is accompanied by her sister who has come from out of state to take care of her during this admission.  Post discharge.  She notes that Leslie Lyons has repetitive speech and repetitive movements and is just not back to herself.  She is concerned that the medication may contribute but and understands the need for her to have mood stabilizing medications so that you will be healthy.  Of note both Xanax and Cymbalta are noted not  to be prescribed  to the patient per recommendations from inpatient psychiatry  ROS See HPI Denies polyuria, polydipsia, blurred vision , or hypoglycemic episodes.   Denies recent fever or chills. Denies sinus pressure, nasal congestion, ear pain or sore throat. Denies chest congestion, productive cough or wheezing. Denies chest pains, palpitations and leg swelling Denies abdominal pain, nausea, vomiting,diarrhea or constipation.   Denies dysuria, frequency, hesitancy or incontinence.  Denies skin break down or rash.   PE  BP 126/77   Pulse 98   Temp 99.1 F (37.3 C) (Skin)   Resp 16   Ht 5' 3"  (1.6 m)   Wt 208 lb (94.3 kg)   SpO2 95%   BMI 36.85 kg/m   Patient alert and oriented and in no cardiopulmonary distress.  HEENT: No facial asymmetry, EOMI,     Neck supple .  Chest: Clear to  auscultation bilaterally.  CVS: S1, S2 no murmurs, no S3.Regular rate.  ABD: Soft non tender.   Ext: No edema  MS: Adequate ROM spine, shoulders, hips and knees.  Skin: Intact, no ulcerations or rash noted.  Psych: Good eye contact, flat  affect. Memory intact mildly  anxious not  depressed appearing.  CNS: CN 2-12 intact, power,  normal throughout.no focal deficits noted.   Assessment & Plan Adjustment disorder with mixed anxiety and depressed mood Recent in patient hospitalization at [place] from May 31 to 05/15/2020. Needs follow up with local psychiatry.asap Current meds are Risperidone and Hydroxyzine  Hospital discharge follow-up Patient in for follow up of recent hospitalization. Discharge summary, and laboratory and radiology data are reviewed, and any questions or concerns about recent hospitalization are discussed. Specific issues requiring follow up are specifically addressed.

## 2020-05-28 NOTE — Telephone Encounter (Signed)
I placed an urgent referral to Psych, Dr Modesta Messing today.  Later on I read a message from Dr Harrington Challenger trying to get help for Janiel to get a Psych appt with Dr Modesta Messing, so I believe she will get an URGENT appt.which she needs Please directly speak with scheduling in Psych to get her the appt she needs asap, thanks!

## 2020-05-28 NOTE — Telephone Encounter (Signed)
Please help me to check on a Pysh referral for the PT

## 2020-05-28 NOTE — Telephone Encounter (Signed)
Lisa from [employer] returning your call. [phone]

## 2020-05-28 NOTE — Telephone Encounter (Signed)
Noted, thanks!

## 2020-05-28 NOTE — Telephone Encounter (Signed)
Please send Albuteral to [facility] in [place],   Please call Mr Laprade, [phone]

## 2020-05-29 ENCOUNTER — Telehealth (HOSPITAL_COMMUNITY): Payer: Self-pay | Admitting: Psychiatry

## 2020-05-29 NOTE — Telephone Encounter (Signed)
Called patient x2 no answer, unable to leave voice message. Sent letter requesting medical records from [facility] for referral sent by Dr. Moshe Cipro. The letter advised patient that this information is needed prior to accepting / scheduling patient per Dr. Modesta Messing.

## 2020-05-29 NOTE — Telephone Encounter (Signed)
Med refilled.

## 2020-06-04 ENCOUNTER — Telehealth (HOSPITAL_COMMUNITY): Payer: Self-pay | Admitting: Psychiatry

## 2020-06-04 NOTE — Telephone Encounter (Signed)
Left patient a detailed message advising we needed further information to process referral and to please call as soon as possible.

## 2020-06-04 NOTE — Telephone Encounter (Signed)
I have called Dr Modesta Messing Office, and Dr Modesta Messing is reviewing the referral today, they will let me know the outcome

## 2020-06-11 ENCOUNTER — Ambulatory Visit: Payer: Medicare Other | Admitting: Family Medicine

## 2020-06-12 ENCOUNTER — Telehealth (HOSPITAL_COMMUNITY): Payer: Self-pay | Admitting: Psychiatry

## 2020-06-12 NOTE — Telephone Encounter (Signed)
Called patient x3 trying to get medical records from inpatient stay at [facility] to attach to incoming referral so that a decision can be made on patient referral. Patient has not made effort in assisting with care, provider will be advised and decision will be made on keeping the referral open or closing due to non-adherence.

## 2020-06-26 ENCOUNTER — Telehealth (HOSPITAL_COMMUNITY): Payer: Self-pay | Admitting: *Deleted

## 2020-06-26 NOTE — Telephone Encounter (Signed)
Referral Received and provider declined referral.

## 2020-06-28 ENCOUNTER — Telehealth (HOSPITAL_COMMUNITY): Payer: Medicare Other | Admitting: Psychiatry

## 2020-07-01 ENCOUNTER — Ambulatory Visit: Payer: Medicare Other | Admitting: Family Medicine

## 2020-08-05 ENCOUNTER — Other Ambulatory Visit: Payer: Self-pay

## 2020-08-05 ENCOUNTER — Ambulatory Visit (INDEPENDENT_AMBULATORY_CARE_PROVIDER_SITE_OTHER): Payer: Medicare Other | Admitting: Family Medicine

## 2020-08-05 ENCOUNTER — Encounter: Payer: Self-pay | Admitting: Family Medicine

## 2020-08-05 ENCOUNTER — Other Ambulatory Visit (HOSPITAL_COMMUNITY)
Admission: RE | Admit: 2020-08-05 | Discharge: 2020-08-05 | Disposition: A | Discharge status: Home / Self Care | Payer: Medicare Other | Source: Ambulatory Visit | Attending: Family Medicine | Admitting: Family Medicine

## 2020-08-05 VITALS — BP 147/56 | HR 102 | Resp 16 | Ht 63.0 in | Wt 198.0 lb

## 2020-08-05 DIAGNOSIS — E559 Vitamin D deficiency, unspecified: Secondary | ICD-10-CM | POA: Insufficient documentation

## 2020-08-05 DIAGNOSIS — E1122 Type 2 diabetes mellitus with diabetic chronic kidney disease: Secondary | ICD-10-CM | POA: Diagnosis not present

## 2020-08-05 DIAGNOSIS — Z23 Encounter for immunization: Secondary | ICD-10-CM | POA: Diagnosis not present

## 2020-08-05 DIAGNOSIS — Z1211 Encounter for screening for malignant neoplasm of colon: Secondary | ICD-10-CM | POA: Diagnosis not present

## 2020-08-05 DIAGNOSIS — E782 Mixed hyperlipidemia: Secondary | ICD-10-CM

## 2020-08-05 DIAGNOSIS — I10 Essential (primary) hypertension: Secondary | ICD-10-CM

## 2020-08-05 DIAGNOSIS — Z794 Long term (current) use of insulin: Secondary | ICD-10-CM | POA: Insufficient documentation

## 2020-08-05 DIAGNOSIS — N182 Chronic kidney disease, stage 2 (mild): Secondary | ICD-10-CM

## 2020-08-05 DIAGNOSIS — F321 Major depressive disorder, single episode, moderate: Secondary | ICD-10-CM | POA: Diagnosis not present

## 2020-08-05 DIAGNOSIS — F4323 Adjustment disorder with mixed anxiety and depressed mood: Secondary | ICD-10-CM | POA: Diagnosis not present

## 2020-08-05 LAB — COMPREHENSIVE METABOLIC PANEL
ALT: 33 U/L (ref 0–44)
AST: 22 U/L (ref 15–41)
Albumin: 4.5 g/dL (ref 3.5–5.0)
Alkaline Phosphatase: 74 U/L (ref 38–126)
Anion gap: 13 (ref 5–15)
BUN: 11 mg/dL (ref 6–20)
CO2: 24 mmol/L (ref 22–32)
Calcium: 9.9 mg/dL (ref 8.9–10.3)
Chloride: 103 mmol/L (ref 98–111)
Creatinine, Ser: 0.87 mg/dL (ref 0.44–1.00)
GFR calc Af Amer: >60 mL/min (ref >60)
GFR calc non Af Amer: >60 mL/min (ref >60)
Glucose, Bld: 121 mg/dL — ABNORMAL HIGH (ref 70–99)
Potassium: 3.5 mmol/L (ref 3.5–5.1)
Sodium: 140 mmol/L (ref 135–145)
Total Bilirubin: 0.8 mg/dL (ref 0.3–1.2)
Total Protein: 8.5 g/dL — ABNORMAL HIGH (ref 6.5–8.1)

## 2020-08-05 LAB — LIPID PANEL
Cholesterol: 147 mg/dL (ref 0–200)
HDL: 51 mg/dL (ref >40)
LDL Cholesterol: 71 mg/dL (ref 0–99)
Total CHOL/HDL Ratio: 2.9 RATIO
Triglycerides: 126 mg/dL (ref <150)
VLDL: 25 mg/dL (ref 0–40)

## 2020-08-05 LAB — POCT GLYCOSYLATED HEMOGLOBIN (HGB A1C): Hemoglobin A1C: 6 % — AB (ref 4.0–5.6)

## 2020-08-05 LAB — VITAMIN D 25 HYDROXY (VIT D DEFICIENCY, FRACTURES): Vit D, 25-Hydroxy: 55.54 ng/mL (ref 30–100)

## 2020-08-05 LAB — TSH: TSH: 1.426 u[IU]/mL (ref 0.350–4.500)

## 2020-08-05 MED ORDER — AMLODIPINE BESYLATE 5 MG PO TABS: 5.0000 mg | ORAL_TABLET | Freq: Every day | ORAL | 5 refills | Status: DC

## 2020-08-05 NOTE — Assessment & Plan Note (Signed)
Hyperlipidemia:Low fat diet discussed and encouraged.   Lipid Panel  Lab Results  Component Value Date   CHOL 147 08/05/2020   HDL 51 08/05/2020   LDLCALC 71 08/05/2020   TRIG 126 08/05/2020   CHOLHDL 2.9 08/05/2020     Controlled, no change in medication

## 2020-08-05 NOTE — Assessment & Plan Note (Addendum)
Uncontrolled and elevated at visit amlodipine 5 mg once daily added DASH diet and commitment to daily physical activity for a minimum of 30 minutes discussed and encouraged, as a part of hypertension management. The importance of attaining a healthy weight is also discussed.  BP/Weight 08/05/2020 05/20/2020 04/28/2020 04/07/2020 10/30/2019 08/28/2019 5/00/9381  Systolic BP 829 937 169 678 938 101 751  Diastolic BP 56 77 67 98 98 90 82  Wt. (Lbs) 198.04 208 216.27 227 227 - 226  BMI 35.08 36.85 38.31 40.21 40.21 40.03 40.03

## 2020-08-05 NOTE — Assessment & Plan Note (Signed)
Needs to establish with Psychiatry

## 2020-08-05 NOTE — Progress Notes (Signed)
Leslie Lyons     MRN: [id number]      DOB: 1961/06/26   HPI Leslie Lyons is here for follow up and re-evaluation of chronic medical conditions, medication management and review of any available recent lab and radiology data.  Preventive health is updated, specifically  Cancer screening and Immunization.   Questions or concerns regarding consultations or procedures which the PT has had in the interim are  addressed. The PT denies any adverse reactions to current medications since the last visit.  Requests resumption of xanax, has not been to Psych, states she has no phone, however her husband will help her to arrange this   ROS Denies recent fever or chills. Denies sinus pressure, nasal congestion, ear pain or sore throat. Denies chest congestion, productive cough or wheezing. Denies chest pains, palpitations and leg swelling Denies abdominal pain, nausea, vomiting,diarrhea or constipation.   Denies dysuria, frequency, hesitancy or incontinence. Denies joint pain, swelling and limitation in mobility. Denies headaches, seizures, numbness, or tingling. C/o  anxiety . Denies skin break down or rash.   PE  BP (!) 147/56   Pulse (!) 102   Resp 16   Ht 5' 3"  (1.6 m)   Wt 198 lb 0.6 oz (89.8 kg)   SpO2 98%   BMI 35.08 kg/m   Patient alert and oriented and in no cardiopulmonary distress.  HEENT: No facial asymmetry, EOMI,     Neck supple .  Chest: Clear to auscultation bilaterally.  CVS: S1, S2 no murmurs, no S3.Regular rate.  ABD: Soft non tender.   Ext: No edema  MS: Adequate ROM spine, shoulders, hips and knees.  Skin: Intact, no ulcerations or rash noted.  Psych: Good eye contact, normal affect. Memory intact not anxious or depressed appearing.  CNS: CN 2-12 intact, power,  normal throughout.no focal deficits noted.   Assessment & Plan  Mixed hyperlipidemia Hyperlipidemia:Low fat diet discussed and encouraged.   Lipid Panel  Lab Results  Component  Value Date   CHOL 147 08/05/2020   HDL 51 08/05/2020   LDLCALC 71 08/05/2020   TRIG 126 08/05/2020   CHOLHDL 2.9 08/05/2020     Controlled, no change in medication   Vitamin D deficiency Controlled , no change in management  Depression, major, single episode, moderate (HCC) not currently being treated by Psychiatry, needs psychiatric service, c/o anxiety, spouse reports poor sleep and poor level of function  Essential hypertension, benign Uncontrolled and elevated at visit amlodipine 5 mg once daily added DASH diet and commitment to daily physical activity for a minimum of 30 minutes discussed and encouraged, as a part of hypertension management. The importance of attaining a healthy weight is also discussed.  BP/Weight 08/05/2020 05/20/2020 04/28/2020 04/07/2020 10/30/2019 08/28/2019 06/07/6268  Systolic BP 485 462 703 500 938 182 993  Diastolic BP 56 77 67 98 98 90 82  Wt. (Lbs) 198.04 208 216.27 227 227 - 226  BMI 35.08 36.85 38.31 40.21 40.21 40.03 40.03       Type 2 diabetes mellitus with stage 2 chronic kidney disease, with long-term current use of insulin ([place]) Leslie Lyons is reminded of the importance of commitment to daily physical activity for 30 minutes or more, as able and the need to limit carbohydrate intake to 30 to 60 grams per meal to help with blood sugar control.   The need to take medication as prescribed, test blood sugar as directed, and to call between visits if there is a concern that blood  sugar is uncontrolled is also discussed.   Leslie Lyons is reminded of the importance of daily foot exam, annual eye examination, and good blood sugar, blood pressure and cholesterol control. Controlled, should reduce lantus dose to 20 units daily  Diabetic Labs Latest Ref Rng & Units 08/05/2020 04/28/2020 04/27/2020 04/26/2020 04/24/2020  HbA1c 4.0 - 5.6 % 6.0(A) - - - -  Microalbumin mg/dL - - - - -  Micro/Creat Ratio <30 mcg/mg creat - - - - -  Chol 0 - 200 mg/dL 147 - -  - -  HDL >40 mg/dL 51 - - - -  Calc LDL 0 - 99 mg/dL 71 - - - -  Triglycerides <150 mg/dL 126 - - - -  Creatinine 0.44 - 1.00 mg/dL 0.87 0.94 1.08(H) 0.87 0.78   BP/Weight 08/05/2020 05/20/2020 04/28/2020 04/07/2020 10/30/2019 08/28/2019 0/17/7939  Systolic BP 030 092 330 076 226 333 545  Diastolic BP 56 77 67 98 98 90 82  Wt. (Lbs) 198.04 208 216.27 227 227 - 226  BMI 35.08 36.85 38.31 40.21 40.21 40.03 40.03   Foot/eye exam completion dates 08/05/2020 08/28/2019  Foot Form Completion Done Done        Adjustment disorder with mixed anxiety and depressed mood Needs to establish with Psychiatry

## 2020-08-05 NOTE — Assessment & Plan Note (Addendum)
not currently being treated by Psychiatry, needs psychiatric service, c/o anxiety, spouse reports poor sleep and poor level of function

## 2020-08-05 NOTE — Patient Instructions (Addendum)
F/U in office re evaluate blood pressure in 6 to 8 weeks, with MD, call if you need me sooner.  Glyco hB in office and foot exam today and microalb  Flu vaccine today  Labs today, lipid, cmp and EGFR , tSH and vit d  You are referred for colonoscopy, you  need this  You are referred for eye exam., you need this  You are referred to  Psychiatry , you need this  New additional medication for blood pressure, amlodipine 5 mg one  Daily  It is important that you exercise regularly at least 30 minutes 5 times a week. If you develop chest pain, have severe difficulty breathing, or feel very tired, stop exercising immediately and seek medical attention  Think about what you will eat, plan ahead. Choose " clean, green, fresh or frozen" over canned, processed or packaged foods which are more sugary, salty and fatty. 70 to 75% of food eaten should be vegetables and fruit. Three meals at set times with snacks allowed between meals, but they must be fruit or vegetables. Aim to eat over a 12 hour period , example 7 am to 7 pm, and STOP after  your last meal of the day. Drink water,generally about 64 ounces per day, no other drink is as healthy. Fruit juice is best enjoyed in a healthy way, by EATING the fruit. Thanks for choosing [facility], we consider it a privelige to serve you.

## 2020-08-05 NOTE — Assessment & Plan Note (Signed)
Leslie Lyons is reminded of the importance of commitment to daily physical activity for 30 minutes or more, as able and the need to limit carbohydrate intake to 30 to 60 grams per meal to help with blood sugar control.   The need to take medication as prescribed, test blood sugar as directed, and to call between visits if there is a concern that blood sugar is uncontrolled is also discussed.   Leslie Lyons is reminded of the importance of daily foot exam, annual eye examination, and good blood sugar, blood pressure and cholesterol control. Controlled, should reduce lantus dose to 20 units daily  Diabetic Labs Latest Ref Rng & Units 08/05/2020 04/28/2020 04/27/2020 04/26/2020 04/24/2020  HbA1c 4.0 - 5.6 % 6.0(A) - - - -  Microalbumin mg/dL - - - - -  Micro/Creat Ratio <30 mcg/mg creat - - - - -  Chol 0 - 200 mg/dL 147 - - - -  HDL >40 mg/dL 51 - - - -  Calc LDL 0 - 99 mg/dL 71 - - - -  Triglycerides <150 mg/dL 126 - - - -  Creatinine 0.44 - 1.00 mg/dL 0.87 0.94 1.08(H) 0.87 0.78   BP/Weight 08/05/2020 05/20/2020 04/28/2020 04/07/2020 10/30/2019 08/28/2019 12/01/7251  Systolic BP 664 403 474 259 563 875 643  Diastolic BP 56 77 67 98 98 90 82  Wt. (Lbs) 198.04 208 216.27 227 227 - 226  BMI 35.08 36.85 38.31 40.21 40.21 40.03 40.03   Foot/eye exam completion dates 08/05/2020 08/28/2019  Foot Form Completion Done Done

## 2020-08-05 NOTE — Assessment & Plan Note (Signed)
Controlled , no change in management

## 2020-08-06 ENCOUNTER — Other Ambulatory Visit: Payer: Self-pay | Admitting: Family Medicine

## 2020-08-06 ENCOUNTER — Encounter: Payer: Self-pay | Admitting: Internal Medicine

## 2020-08-06 LAB — MICROALBUMIN, URINE: Microalb, Ur: 22.2 ug/mL — ABNORMAL HIGH

## 2020-08-26 ENCOUNTER — Telehealth: Payer: Self-pay

## 2020-08-26 NOTE — Telephone Encounter (Signed)
Can you please verify with her pharmacy if there is no long acting insulin that is covered by her insurance before going to the 70/30

## 2020-08-26 NOTE — Telephone Encounter (Signed)
Wants to know if someone will call her back on [facility] phone she has a question about her med

## 2020-08-26 NOTE — Telephone Encounter (Signed)
Pls check with her  pharmacy to see if there is an equivalent covered by her insuranceif not the will to [facility] oTC 70/30

## 2020-08-26 NOTE — Telephone Encounter (Signed)
States that the insulin is over $1000 and wants to know what to do. Is there [facility] insulin she can be changed to. They don't have the long acting insulin but they have novolog, novolin 70/30 and novolin R

## 2020-08-27 ENCOUNTER — Other Ambulatory Visit: Payer: Self-pay

## 2020-08-27 MED ORDER — INSULIN GLARGINE 100 UNIT/ML SOLOSTAR PEN: 25.0000 [IU] | PEN_INJECTOR | Freq: Every day | SUBCUTANEOUS | 11 refills | Status: DC

## 2020-08-27 NOTE — Telephone Encounter (Signed)
Patgient needs psychiatric care, I have referred her long GO TO Dr Modesta Messing, , if she still has not established with her , I recommend she go to Mental health in [facility], that is self referral. See msg re lantus already sent in Brandi's box

## 2020-08-27 NOTE — Telephone Encounter (Signed)
Spoke with patient and provided information for [employer].

## 2020-08-27 NOTE — Telephone Encounter (Signed)
Spoke with patient. She stated her Lantus copay is $1000 a month. Can you prescribe something cheaper? She also states her risperdone is not working. Her sister states that she is fidgety all the time, stares into space or at walls like she is in a different place. Wants something else

## 2020-08-28 NOTE — Telephone Encounter (Signed)
Advise relion 7 units twice daily, and change in her med list please

## 2020-08-28 NOTE — Telephone Encounter (Signed)
They said they can't tell the cost of the med without running a prescription.  Do you want to try another kind or tell her to get the relion 70/30 and take how many units?

## 2020-08-29 ENCOUNTER — Other Ambulatory Visit: Payer: Self-pay

## 2020-08-29 DIAGNOSIS — E1122 Type 2 diabetes mellitus with diabetic chronic kidney disease: Secondary | ICD-10-CM

## 2020-08-29 MED ORDER — INSULIN NPH ISOPHANE & REGULAR (70-30) 100 UNIT/ML ~~LOC~~ SUSP: 7.0000 [IU] | Freq: Two times a day (BID) | SUBCUTANEOUS | 3 refills | Status: DC

## 2020-08-29 NOTE — Telephone Encounter (Signed)
Prescription sent to pharmacy per providers instructions. Med list updated.

## 2020-09-03 DIAGNOSIS — F329 Major depressive disorder, single episode, unspecified: Secondary | ICD-10-CM | POA: Diagnosis not present

## 2020-09-20 NOTE — Progress Notes (Deleted)
Referring Provider: [name], MD Primary Care Physician:  [name], MD Primary GI Physician: Dr. Abbey Chatters  No chief complaint on file.   HPI:   Leslie Lyons is a 59 y.o. female presenting today at request of [name], MD for colon cancer screening.  History of abnormal LFTs, constipation, internal/external hemorrhoids.  Last colonoscopy in 2010 with Dr. Arnoldo Morale.  She was due for repeat in 2020, currently overdue.  EGD also performed in 2010 by Dr. Arnoldo Morale with hiatal hernia.  Prior evaluation for elevated LFTs in 2019 this was reviewed with Dr. Oneida Alar who suspected findings were secondary to NASH.  Recommended trial of weight loss over the next 6 months and if she was unable, proceed to liver biopsy.  With hepatitis A IgM, hepatitis B surface antigen, hepatitis B core IgM, hepatitis C antibody negative.  ANA positive with 1: 80 titer with mitotic pattern, ASMA, AMA negative, IgA and IgG mildly elevated at 499 and 1660 respectively, ferritin elevated at 515.  This was reviewed with Dr. Oneida Alar who suspected findings were secondary to NASH.  Recommended weight loss over the next 6 months, and if she was unable, proceed to liver biopsy.  Patient has not been seen since 2019.  LFTs had essentially returned to normal in August 2020. Most recent LFTs in September 2021 with AST 22, ALT 33, alk phos 74.   Today:    Needs propofol.  Was awake during last procedure.  Past Medical History:  Diagnosis Date  . Abnormal transaminases   . Anxiety   . Bronchial asthma    with acute exacerbation  . Cancer ([place]) 1980   cervical, diag  at age 43  . Depression   . Diabetes ([place])   . Headache(784.0)    with reduced vision in right eye for one month   . Hyperlipidemia   . Hypertension   . Obesity     Past Surgical History:  Procedure Laterality Date  . ABDOMINAL HYSTERECTOMY    . COLONOSCOPY WITH ESOPHAGOGASTRODUODENOSCOPY (EGD)  11/2008   Dr. Arnoldo Morale:  Conscious sedation.  Hiatal hernia.  Hemorrhoids. PATIENT REPORTS BEING AWAKE DURING PROCEDURE  . CRYOTHERAPY     for abnormal pap  . HEMORRHOID SURGERY     dr. Irving Shows  . PARTIAL HYSTERECTOMY      Current Outpatient Medications  Medication Sig Dispense Refill  . albuterol (PROAIR HFA) 108 (90 Base) MCG/ACT inhaler Inhale 2 puffs into the lungs every 6 (six) hours as needed. 18 g 3  . amLODipine (NORVASC) 5 MG tablet Take 1 tablet (5 mg total) by mouth daily. 30 tablet 5  . atorvastatin (LIPITOR) 20 MG tablet Take 1 tablet (20 mg total) by mouth daily. 90 tablet 3  . Cholecalciferol (VITAMIN D3) 125 MCG (5000 UT) CAPS Take 1 capsule (5,000 Units total) by mouth daily. 90 capsule 0  . ezetimibe (ZETIA) 10 MG tablet Take 1 tablet (10 mg total) by mouth daily. (Patient taking differently: Take 10 mg by mouth daily. ) 90 tablet 3  . glipiZIDE (GLUCOTROL XL) 10 MG 24 hr tablet Take 1 tablet (10 mg total) by mouth daily with breakfast. 30 tablet 2  . hydrOXYzine (ATARAX/VISTARIL) 25 MG tablet Take 25 mg by mouth every 6 (six) hours as needed.    . insulin NPH-regular Human (70-30) 100 UNIT/ML injection Inject 7 Units into the skin 2 (two) times daily with a meal. 10 mL 3  . lisinopril (ZESTRIL) 10 MG tablet  Take 1 tablet (10 mg total) by mouth daily. 30 tablet 2  . mometasone (NASONEX) 50 MCG/ACT nasal spray Place 2 sprays into the nose daily. 51 g 3  . mometasone-formoterol (DULERA) 200-5 MCG/ACT AERO Inhale 2 puffs into the lungs 2 (two) times daily. 13 g 2  . risperidone (RISPERDAL) 4 MG tablet TAKE 1 TABLET BY MOUTH AT BEDTIME 30 tablet 5  . tolterodine (DETROL LA) 2 MG 24 hr capsule Take 1 capsule (2 mg total) by mouth daily. (Patient taking differently: Take 2 mg by mouth daily. urination) 90 capsule 3  . topiramate (TOPAMAX) 100 MG tablet Take 1 tablet (100 mg total) by mouth 2 (two) times daily. 30 tablet 2   No current facility-administered medications for this visit.    Allergies  as of 09/22/2020 - Review Complete 08/05/2020  Allergen Reaction Noted  . Janumet [sitagliptin-metformin hcl]  10/03/2018  . Robaxin [methocarbamol] Swelling 07/31/2013    Family History  Problem Relation Age of Onset  . Cancer Mother        lung   . COPD Father        emphysema  . Hypertension Father   . Cancer Maternal Aunt        breast  . Cancer Paternal Uncle   . Colon cancer Neg Hx     Social History   Socioeconomic History  . Marital status: Married    Spouse name: Not on file  . Number of children: 2  . Years of education: Not on file  . Highest education level: Not on file  Occupational History  . Occupation: homemaker   Tobacco Use  . Smoking status: Former Smoker    Packs/day: 0.25    Years: 2.00    Pack years: 0.50    Types: Cigarettes    Quit date: 12/04/2015    Years since quitting: 4.8  . Smokeless tobacco: Never Used  Vaping Use  . Vaping Use: Never used  Substance and Sexual Activity  . Alcohol use: No  . Drug use: No  . Sexual activity: Not Currently  Other Topics Concern  . Not on file  Social History Narrative  . Not on file   Social Determinants of Health   Financial Resource Strain:   . Difficulty of Paying Living Expenses: Not on file  Food Insecurity:   . Worried About Charity fundraiser in the Last Year: Not on file  . Ran Out of Food in the Last Year: Not on file  Transportation Needs:   . Lack of Transportation (Medical): Not on file  . Lack of Transportation (Non-Medical): Not on file  Physical Activity:   . Days of Exercise per Week: Not on file  . Minutes of Exercise per Session: Not on file  Stress:   . Feeling of Stress : Not on file  Social Connections:   . Frequency of Communication with Friends and Family: Not on file  . Frequency of Social Gatherings with Friends and Family: Not on file  . Attends Religious Services: Not on file  . Active Member of Clubs or Organizations: Not on file  . Attends Theatre manager Meetings: Not on file  . Marital Status: Not on file    Review of Systems: Gen: Denies fever, chills, anorexia. Denies fatigue, weakness, weight loss.  CV: Denies chest pain, palpitations, syncope, peripheral edema, and claudication. Resp: Denies dyspnea at rest, cough, wheezing, coughing up blood, and pleurisy. GI: Denies vomiting blood, jaundice, and fecal incontinence.  Denies dysphagia or odynophagia. Derm: Denies rash, itching, dry skin Psych: Denies depression, anxiety, memory loss, confusion. No homicidal or suicidal ideation.  Heme: Denies bruising, bleeding, and enlarged lymph nodes.  Physical Exam: There were no vitals taken for this visit. General:   Alert and oriented. No distress noted. Pleasant and cooperative.  Head:  Normocephalic and atraumatic. Eyes:  Conjuctiva clear without scleral icterus. Mouth:  Oral mucosa pink and moist. Good dentition. No lesions. Heart:  S1, S2 present without murmurs appreciated. Lungs:  Clear to auscultation bilaterally. No wheezes, rales, or rhonchi. No distress.  Abdomen:  +BS, soft, non-tender and non-distended. No rebound or guarding. No HSM or masses noted. Msk:  Symmetrical without gross deformities. Normal posture. Extremities:  Without edema. Neurologic:  Alert and  oriented x4 Psych:  Alert and cooperative. Normal mood and affect.

## 2020-09-21 ENCOUNTER — Other Ambulatory Visit: Payer: Self-pay | Admitting: Family Medicine

## 2020-09-21 DIAGNOSIS — E782 Mixed hyperlipidemia: Secondary | ICD-10-CM

## 2020-09-22 ENCOUNTER — Telehealth: Payer: Self-pay | Admitting: General Practice

## 2020-09-22 ENCOUNTER — Ambulatory Visit: Payer: Medicare Other | Admitting: Gastroenterology

## 2020-09-22 NOTE — Telephone Encounter (Signed)
I called the patient to reschedule her due to a scheduling conflict.  The patient stated she would not like to have a tcs done right now and she will call back to reschedule.

## 2020-09-29 ENCOUNTER — Other Ambulatory Visit: Payer: Self-pay

## 2020-09-29 DIAGNOSIS — E782 Mixed hyperlipidemia: Secondary | ICD-10-CM

## 2020-09-29 MED ORDER — ATORVASTATIN CALCIUM 20 MG PO TABS: 20.0000 mg | ORAL_TABLET | Freq: Every day | ORAL | 0 refills | Status: DC

## 2020-09-30 ENCOUNTER — Ambulatory Visit: Payer: Medicare Other | Admitting: Internal Medicine

## 2020-10-11 ENCOUNTER — Other Ambulatory Visit: Payer: Self-pay | Admitting: Family Medicine

## 2020-10-27 ENCOUNTER — Telehealth: Payer: Self-pay | Admitting: Family Medicine

## 2020-10-27 NOTE — Telephone Encounter (Signed)
Patient is wondering if there is another type of insulin pen she maybe able to try she has not been taking the Novolin 70/30 due to having to draw it up and inject it  P# [phone]

## 2020-10-28 NOTE — Telephone Encounter (Signed)
Please address at next visit

## 2020-10-28 NOTE — Telephone Encounter (Signed)
Needs to be addressed at scheduled visit in 2 days with Dr Posey Pronto

## 2020-10-30 ENCOUNTER — Other Ambulatory Visit: Payer: Self-pay

## 2020-10-30 ENCOUNTER — Ambulatory Visit (INDEPENDENT_AMBULATORY_CARE_PROVIDER_SITE_OTHER): Payer: Medicare Other | Admitting: Family Medicine

## 2020-10-30 ENCOUNTER — Encounter: Payer: Self-pay | Admitting: Family Medicine

## 2020-10-30 ENCOUNTER — Ambulatory Visit: Payer: Medicare Other | Admitting: Internal Medicine

## 2020-10-30 VITALS — BP 120/82 | HR 100 | Resp 15 | Ht 63.0 in | Wt 200.0 lb

## 2020-10-30 DIAGNOSIS — Z794 Long term (current) use of insulin: Secondary | ICD-10-CM

## 2020-10-30 DIAGNOSIS — N182 Chronic kidney disease, stage 2 (mild): Secondary | ICD-10-CM

## 2020-10-30 DIAGNOSIS — I10 Essential (primary) hypertension: Secondary | ICD-10-CM

## 2020-10-30 DIAGNOSIS — Z1231 Encounter for screening mammogram for malignant neoplasm of breast: Secondary | ICD-10-CM

## 2020-10-30 DIAGNOSIS — E782 Mixed hyperlipidemia: Secondary | ICD-10-CM | POA: Diagnosis not present

## 2020-10-30 DIAGNOSIS — F29 Unspecified psychosis not due to a substance or known physiological condition: Secondary | ICD-10-CM | POA: Diagnosis not present

## 2020-10-30 DIAGNOSIS — E1122 Type 2 diabetes mellitus with diabetic chronic kidney disease: Secondary | ICD-10-CM | POA: Diagnosis not present

## 2020-10-30 LAB — POCT GLYCOSYLATED HEMOGLOBIN (HGB A1C): HbA1c, POC (prediabetic range): 6.1 % (ref 5.7–6.4)

## 2020-10-30 MED ORDER — EZETIMIBE 10 MG PO TABS
10.0000 mg | ORAL_TABLET | Freq: Every day | ORAL | 3 refills | Status: DC
Start: 2020-10-30 — End: 2020-10-30

## 2020-10-30 MED ORDER — EZETIMIBE 10 MG PO TABS
10.0000 mg | ORAL_TABLET | Freq: Every day | ORAL | 1 refills | Status: DC
Start: 2020-10-30 — End: 2021-07-02

## 2020-10-30 MED ORDER — "INSULIN SYRINGE 29G X 1/2"" 0.5 ML MISC": 5 refills | Status: DC

## 2020-10-30 MED ORDER — BLOOD GLUCOSE METER KIT: PACK | 0 refills | Status: AC

## 2020-10-30 NOTE — Patient Instructions (Addendum)
F/U in office with mD , re eva;luate blood diabetes and cholesterol mid March, call IF YOU NEED ME SOONER  FOR BLOOD SUGAR, ONLY TAKE GLIPIZIDE 10 MG ONE DAILY, INSULIN IS STOPPED    Test blood sugar once daily every morning before breakfast.  If your fasting sugars are consistently higher than 140 please call so that your medication dose can be adjusted.  Meter and test supplies for once daily testing are to be sent to your pharmacy.  Please get fasting lipids CMP and EGFR and HbA1c 1 week before your next visit.  Please schedule mammogram which is due in January at checkout.  Thanks for choosing [facility], we consider it a privelige to serve you.

## 2020-11-02 ENCOUNTER — Encounter: Payer: Self-pay | Admitting: Family Medicine

## 2020-11-02 NOTE — Assessment & Plan Note (Signed)
  Patient re-educated about  the importance of commitment to a  minimum of 150 minutes of exercise per week as able.  The importance of healthy food choices with portion control discussed, as well as eating regularly and within a 12 hour window most days. The need to choose "clean , green" food 50 to 75% of the time is discussed, as well as to make water the primary drink and set a goal of 64 ounces water daily.    Weight /BMI 10/30/2020 08/05/2020 05/20/2020  WEIGHT 200 lb 198 lb 0.6 oz 208 lb  HEIGHT 5' 3"  5' 3"  5' 3"   BMI 35.43 kg/m2 35.08 kg/m2 36.85 kg/m2

## 2020-11-02 NOTE — Assessment & Plan Note (Signed)
Leslie Lyons is reminded of the importance of commitment to daily physical activity for 30 minutes or more, as able and the need to limit carbohydrate intake to 30 to 60 grams per meal to help with blood sugar control.   The need to take medication as prescribed, test blood sugar as directed, and to call between visits if there is a concern that blood sugar is uncontrolled is also discussed.   Leslie Lyons is reminded of the importance of daily foot exam, annual eye examination, and good blood sugar, blood pressure and cholesterol control. D/c insulin, take glipizide 10 mfg daily  Diabetic Labs Latest Ref Rng & Units 10/30/2020 08/05/2020 04/28/2020 04/27/2020 04/26/2020  HbA1c 5.7 - 6.4 % 6.1 6.0(A) - - -  Microalbumin Not Estab. ug/mL - 22.2(H) - - -  Micro/Creat Ratio <30 mcg/mg creat - - - - -  Chol 0 - 200 mg/dL - 147 - - -  HDL >40 mg/dL - 51 - - -  Calc LDL 0 - 99 mg/dL - 71 - - -  Triglycerides <150 mg/dL - 126 - - -  Creatinine 0.44 - 1.00 mg/dL - 0.87 0.94 1.08(H) 0.87   BP/Weight 10/30/2020 08/05/2020 05/20/2020 04/28/2020 04/07/2020 10/30/2019 12/08/2109  Systolic BP 735 670 141 030 131 438 887  Diastolic BP 82 56 77 67 98 98 90  Wt. (Lbs) 200 198.04 208 216.27 227 227 -  BMI 35.43 35.08 36.85 38.31 40.21 40.21 40.03   Foot/eye exam completion dates 08/05/2020 08/28/2019  Foot Form Completion Done Done

## 2020-11-02 NOTE — Assessment & Plan Note (Signed)
Controlled, no change in medication DASH diet and commitment to daily physical activity for a minimum of 30 minutes discussed and encouraged, as a part of hypertension management. The importance of attaining a healthy weight is also discussed.  BP/Weight 10/30/2020 08/05/2020 05/20/2020 04/28/2020 04/07/2020 10/30/2019 4/49/7530  Systolic BP 051 102 111 735 670 141 030  Diastolic BP 82 56 77 67 98 98 90  Wt. (Lbs) 200 198.04 208 216.27 227 227 -  BMI 35.43 35.08 36.85 38.31 40.21 40.21 40.03

## 2020-11-02 NOTE — Assessment & Plan Note (Signed)
Currently untreated as patient is not taking medications prescribed.  She is encouraged resume medications prescribed by psychiatry and keep follow-up appointments.

## 2020-11-02 NOTE — Assessment & Plan Note (Signed)
Hyperlipidemia:Low fat diet discussed and encouraged.   Lipid Panel  Lab Results  Component Value Date   CHOL 147 08/05/2020   HDL 51 08/05/2020   LDLCALC 71 08/05/2020   TRIG 126 08/05/2020   CHOLHDL 2.9 08/05/2020     Controlled, no change in medication'

## 2020-11-02 NOTE — Progress Notes (Signed)
Leslie Lyons     MRN: [id number]      DOB: 04/18/1961   HPI Leslie Lyons is here for follow up and re-evaluation of chronic medical conditions, medication management and review of any available recent lab and radiology data.  Preventive health is updated, specifically  Cancer screening and Immunization.    Not taking psych meds still rocking, states she is improving and healing herself Unable to afford insulin and has been off for at least 3 weeks Not testing blood sugar , as no test supplies Denies polyuria, polydipsia, blurred vision , or hypoglycemic episodes.   ROS Denies recent fever or chills. Denies sinus pressure, nasal congestion, ear pain or sore throat. Denies chest congestion, productive cough or wheezing. Denies chest pains, palpitations and leg swelling Denies abdominal pain, nausea, vomiting,diarrhea or constipation.   Denies dysuria, frequency, hesitancy or incontinence. Denies joint pain, swelling and limitation in mobility. Denies headaches, seizures, numbness, or tingling. Denies uncontrolled  depression, anxiety or insomnia. Denies skin break down or rash.   PE  BP 120/82   Pulse 100   Resp 15   Ht 5' 3"  (1.6 m)   Wt 200 lb (90.7 kg)   SpO2 98%   BMI 35.43 kg/m   Patient alert and oriented and in no cardiopulmonary distress.  HEENT: No facial asymmetry, EOMI,     Neck supple .  Chest: Clear to auscultation bilaterally.  CVS: S1, S2 no murmurs, no S3.Regular rate.  ABD: Soft non tender.   Ext: No edema  MS: Adequate ROM spine, shoulders, hips and knees.  Skin: Intact, no ulcerations or rash noted.  Psych: Good eye contact, normal affect. Memory intact not anxious or depressed appearing.  CNS: CN 2-12 intact, power,  normal throughout.no focal deficits noted.   Assessment & Plan  Essential hypertension, benign Controlled, no change in medication DASH diet and commitment to daily physical activity for a minimum of 30 minutes  discussed and encouraged, as a part of hypertension management. The importance of attaining a healthy weight is also discussed.  BP/Weight 10/30/2020 08/05/2020 05/20/2020 04/28/2020 04/07/2020 10/30/2019 6/54/6503  Systolic BP 546 568 127 517 001 749 449  Diastolic BP 82 56 77 67 98 98 90  Wt. (Lbs) 200 198.04 208 216.27 227 227 -  BMI 35.43 35.08 36.85 38.31 40.21 40.21 40.03       Psychosis (HCC) Currently untreated as patient is not taking medications prescribed.  She is encouraged resume medications prescribed by psychiatry and keep follow-up appointments.  Morbid obesity ([place])  Patient re-educated about  the importance of commitment to a  minimum of 150 minutes of exercise per week as able.  The importance of healthy food choices with portion control discussed, as well as eating regularly and within a 12 hour window most days. The need to choose "clean , green" food 50 to 75% of the time is discussed, as well as to make water the primary drink and set a goal of 64 ounces water daily.    Weight /BMI 10/30/2020 08/05/2020 05/20/2020  WEIGHT 200 lb 198 lb 0.6 oz 208 lb  HEIGHT 5' 3"  5' 3"  5' 3"   BMI 35.43 kg/m2 35.08 kg/m2 36.85 kg/m2      Type 2 diabetes mellitus with stage 2 chronic kidney disease, with long-term current use of insulin ([place]) Leslie Lyons is reminded of the importance of commitment to daily physical activity for 30 minutes or more, as able and the need to limit carbohydrate intake to  30 to 60 Lyons per meal to help with blood sugar control.   The need to take medication as prescribed, test blood sugar as directed, and to call between visits if there is a concern that blood sugar is uncontrolled is also discussed.   Leslie Lyons is reminded of the importance of daily foot exam, annual eye examination, and good blood sugar, blood pressure and cholesterol control. D/c insulin, take glipizide 10 mfg daily  Diabetic Labs Latest Ref Rng & Units 10/30/2020 08/05/2020 04/28/2020  04/27/2020 04/26/2020  HbA1c 5.7 - 6.4 % 6.1 6.0(A) - - -  Microalbumin Not Estab. ug/mL - 22.2(H) - - -  Micro/Creat Ratio <30 mcg/mg creat - - - - -  Chol 0 - 200 mg/dL - 147 - - -  HDL >40 mg/dL - 51 - - -  Calc LDL 0 - 99 mg/dL - 71 - - -  Triglycerides <150 mg/dL - 126 - - -  Creatinine 0.44 - 1.00 mg/dL - 0.87 0.94 1.08(H) 0.87   BP/Weight 10/30/2020 08/05/2020 05/20/2020 04/28/2020 04/07/2020 10/30/2019 3/47/4259  Systolic BP 563 875 643 329 518 841 660  Diastolic BP 82 56 77 67 98 98 90  Wt. (Lbs) 200 198.04 208 216.27 227 227 -  BMI 35.43 35.08 36.85 38.31 40.21 40.21 40.03   Foot/eye exam completion dates 08/05/2020 08/28/2019  Foot Form Completion Done Done         Mixed hyperlipidemia Hyperlipidemia:Low fat diet discussed and encouraged.   Lipid Panel  Lab Results  Component Value Date   CHOL 147 08/05/2020   HDL 51 08/05/2020   LDLCALC 71 08/05/2020   TRIG 126 08/05/2020   CHOLHDL 2.9 08/05/2020     Controlled, no change in medication'

## 2020-11-12 ENCOUNTER — Telehealth (INDEPENDENT_AMBULATORY_CARE_PROVIDER_SITE_OTHER): Payer: Medicare Other | Admitting: Nurse Practitioner

## 2020-11-12 ENCOUNTER — Other Ambulatory Visit: Payer: Self-pay

## 2020-11-12 ENCOUNTER — Encounter: Payer: Self-pay | Admitting: Nurse Practitioner

## 2020-11-12 ENCOUNTER — Telehealth: Payer: Self-pay | Admitting: *Deleted

## 2020-11-12 ENCOUNTER — Other Ambulatory Visit: Payer: Self-pay | Admitting: Family Medicine

## 2020-11-12 DIAGNOSIS — Z0000 Encounter for general adult medical examination without abnormal findings: Secondary | ICD-10-CM | POA: Diagnosis not present

## 2020-11-12 DIAGNOSIS — Z0100 Encounter for examination of eyes and vision without abnormal findings: Secondary | ICD-10-CM

## 2020-11-12 MED ORDER — GABAPENTIN 100 MG PO CAPS: 100.0000 mg | ORAL_CAPSULE | Freq: Three times a day (TID) | ORAL | 3 refills | Status: DC

## 2020-11-12 NOTE — Telephone Encounter (Signed)
Pls advise gabapentin is prescribed and this should help alot

## 2020-11-12 NOTE — Progress Notes (Signed)
Gabapentin

## 2020-11-12 NOTE — Telephone Encounter (Signed)
Pt states that her feet are on fire and feels like pins and needles would like to know what she could do about this Leslie Lyons has rubbed them in alcohol

## 2020-11-12 NOTE — Progress Notes (Addendum)
Subjective:   Leslie Lyons is a 59 y.o. female who presents for Medicare Annual (Subsequent) preventive examination.  Review of Systems     Cardiac Risk Factors include: advanced age (>30mn, >>60women);diabetes mellitus;hypertension;smoking/ tobacco exposure     Objective:    Today's Vitals   11/12/20 1534 11/12/20 1535  PainSc: 10-Worst pain ever 10-Worst pain ever  PainLoc: Foot    There is no height or weight on file to calculate BMI.  Advanced Directives 11/12/2020 04/26/2020  Does Patient Have a Medical Advance Directive? No No  Would patient like information on creating a medical advance directive? No - Patient declined No - Patient declined    Current Medications (verified) Outpatient Encounter Medications as of 11/12/2020  Medication Sig  . albuterol (PROAIR HFA) 108 (90 Base) MCG/ACT inhaler Inhale 2 puffs into the lungs every 6 (six) hours as needed.  .Marland KitchenamLODipine (NORVASC) 5 MG tablet Take 1 tablet (5 mg total) by mouth daily.  .Marland Kitchenatorvastatin (LIPITOR) 20 MG tablet Take 1 tablet (20 mg total) by mouth daily.  . blood glucose meter kit and supplies Dispense based on patient and insurance preference. Test blood sugar once daily before breakfast. (FOR ICD-10 E10.9, E11.9).  .Marland KitchenCholecalciferol (VITAMIN D3) 125 MCG (5000 UT) CAPS Take 1 capsule (5,000 Units total) by mouth daily.  .Marland Kitchenezetimibe (ZETIA) 10 MG tablet Take 1 tablet (10 mg total) by mouth daily.  .Marland KitchenglipiZIDE (GLUCOTROL XL) 10 MG 24 hr tablet Take 1 tablet by mouth once daily with breakfast  . hydrOXYzine (ATARAX/VISTARIL) 25 MG tablet Take 25 mg by mouth every 6 (six) hours as needed.  .Marland Kitchenlisinopril (ZESTRIL) 10 MG tablet Take 1 tablet (10 mg total) by mouth daily.  . mometasone (NASONEX) 50 MCG/ACT nasal spray Place 2 sprays into the nose daily.  . mometasone-formoterol (DULERA) 200-5 MCG/ACT AERO Inhale 2 puffs into the lungs 2 (two) times daily.  . risperidone (RISPERDAL) 4 MG tablet TAKE 1 TABLET BY  MOUTH AT BEDTIME  . tolterodine (DETROL LA) 2 MG 24 hr capsule Take 1 capsule (2 mg total) by mouth daily. (Patient taking differently: Take 2 mg by mouth daily. urination)  . topiramate (TOPAMAX) 100 MG tablet Take 1 tablet (100 mg total) by mouth 2 (two) times daily.   No facility-administered encounter medications on file as of 11/12/2020.    Allergies (verified) Janumet [sitagliptin-metformin hcl] and Robaxin [methocarbamol]   History: Past Medical History:  Diagnosis Date  . Abnormal transaminases   . Anxiety   . Bronchial asthma    with acute exacerbation  . [place]   cervical, diag  at age 59 . Depression   . [place]   . Headache(784.0)    with reduced vision in right eye for one month   . Hyperlipidemia   . Hypertension   . Obesity    Past Surgical History:  Procedure Laterality Date  . ABDOMINAL HYSTERECTOMY    . COLONOSCOPY WITH ESOPHAGOGASTRODUODENOSCOPY (EGD)  11/2008   Dr. JArnoldo Morale Conscious sedation.  Hiatal hernia.  Hemorrhoids. PATIENT REPORTS BEING AWAKE DURING PROCEDURE  . CRYOTHERAPY     for abnormal pap  . HEMORRHOID SURGERY     dr. lIrving Shows . PARTIAL HYSTERECTOMY     Family History  Problem Relation Age of Onset  . Cancer Mother        lung   . COPD Father        emphysema  . Hypertension Father   .  Cancer Maternal Aunt        breast  . Cancer Paternal Uncle   . Colon cancer Neg Hx    Social History   Socioeconomic History  . Marital status: Married    Spouse name: Not on file  . Number of children: 2  . Years of education: Not on file  . Highest education level: Not on file  Occupational History  . Occupation: homemaker   Tobacco Use  . Smoking status: Former Smoker    Packs/day: 0.25    Years: 2.00    Pack years: 0.50    Types: Cigarettes    Quit date: 12/04/2015    Years since quitting: 4.9  . Smokeless tobacco: Never Used  Vaping Use  . Vaping Use: Never used  Substance and Sexual Activity  . Alcohol  use: No  . Drug use: No  . Sexual activity: Not Currently  Other Topics Concern  . Not on file  Social History Narrative   Lives at home with husband    Social Determinants of Health   Financial Resource Strain: Low Risk   . Difficulty of Paying Living Expenses: Not hard at all  Food Insecurity: No Food Insecurity  . Worried About Charity fundraiser in the Last Year: Never true  . Ran Out of Food in the Last Year: Never true  Transportation Needs: No Transportation Needs  . Lack of Transportation (Medical): No  . Lack of Transportation (Non-Medical): No  Physical Activity: Inactive  . Days of Exercise per Week: 0 days  . Minutes of Exercise per Session: 0 min  Stress: No Stress Concern Present  . Feeling of Stress : Not at all  Social Connections: Moderately Integrated  . Frequency of Communication with Friends and Family: More than three times a week  . Frequency of Social Gatherings with Friends and Family: More than three times a week  . Attends Religious Services: 1 to 4 times per year  . Active Member of Clubs or Organizations: No  . Attends Archivist Meetings: Never  . Marital Status: Married    Tobacco Counseling Counseling given: Not Answered   Clinical Intake:  Pre-visit preparation completed: Yes  Pain : 0-10 Pain Score: 10-Worst pain ever Pain Type: Chronic pain Pain Location: Foot Pain Orientation: Right,Left Pain Descriptors / Indicators: Burning Pain Onset: 1 to 4 weeks ago Pain Frequency: Several days a week Pain Relieving Factors: rubbing with alcohol Effect of Pain on Daily Activities: cant walk anymore  Pain Relieving Factors: rubbing with alcohol  BMI - recorded: 35.44 Nutritional Status: BMI > 30  Obese Nutritional Risks: None Diabetes: Yes CBG done?: No Did pt. bring in CBG monitor from home?: No  How often do you need to have someone help you when you read instructions, pamphlets, or other written materials from your doctor  or pharmacy?: 1 - Never What is the last grade level you completed in school?: 12  Diabetic?yes  Interpreter Needed?: No      Activities of Daily Living In your present state of health, do you have any difficulty performing the following activities: 11/12/2020 04/26/2020  Hearing? N N  Vision? N N  Difficulty concentrating or making decisions? Tempie Donning  Walking or climbing stairs? Y Y  Dressing or bathing? N Y  Doing errands, shopping? Tempie Donning  Preparing Food and eating ? N -  Using the Toilet? N -  In the past six months, have you accidently leaked urine? N -  Do you have problems with loss of bowel control? N -  Managing your Medications? N -  Managing your Finances? N -  Housekeeping or managing your Housekeeping? N -  Some recent data might be hidden    Patient Care Team: [name], MD as PCP - General (Family Medicine) Madelin Headings, DO (Optometry) Fields, Marga Melnick, MD (Inactive) as Consulting Physician (Gastroenterology) Eloise Harman, DO as Consulting Physician (Internal Medicine)  Indicate any recent Medical Services you may have received from other than [facility] providers in the past year (date may be approximate).     Assessment:   This is a routine wellness examination for Leslie Lyons.  Hearing/Vision screen No exam data present  Dietary issues and exercise activities discussed: Current Exercise Habits: Home exercise routine, Time (Minutes): 10, Frequency (Times/Week): 1, Weekly Exercise (Minutes/Week): 10, Intensity: Mild, Exercise limited by: None identified  Goals    . Patient Stated     Would like to get back to the old Leslie Lyons she cant remember like she used to       Depression Screen PHQ 2/9 Scores 11/12/2020 11/12/2020 10/30/2020 08/05/2020 05/20/2020 07/11/2019 07/11/2019  PHQ - 2 Score 3 4 0 0 _0 PHQ- 9 Score - - 2 - _1 Fall Risk Fall Risk  11/12/2020 08/05/2020 05/20/2020 10/30/2019 08/28/2019  Falls in the past year? 0 0 _2 Number falls  in past yr: 0 - _3 Injury with Fall? 0 - 0 0 1  Risk Factor Category  - - - - -  Risk for fall due to : No Fall Risks - - - -  Follow up Falls evaluation completed - - - -    FALL RISK PREVENTION PERTAINING TO THE HOME:  Any stairs in or around the home? No  If so, are there any without handrails? No  Home free of loose throw rugs in walkways, pet beds, electrical cords, etc? Yes  Adequate lighting in your home to reduce risk of falls? Yes   ASSISTIVE DEVICES UTILIZED TO PREVENT FALLS:  Life alert? No  Use of a cane, walker or w/c? Yes  Grab bars in the bathroom? No  Shower chair or bench in shower? Yes  Elevated toilet seat or a handicapped toilet? No   TIMED UP AND GO:  Was the test performed? No .  Length of time to ambulate 10 feet: NA sec.     Cognitive Function: MMSE - Mini Mental State Exam 11/12/2020  Not completed: Unable to complete     6CIT Screen 11/12/2020 04/06/2019  What Year? 0 points 0 points  What month? 0 points 0 points  What time? 0 points 0 points  Count back from 20 0 points 0 points  Months in reverse 4 points 0 points  Repeat phrase 10 points 2 points  Total Score 14 2    Immunizations Immunization History  Administered Date(s) Administered  . Influenza Whole 09/25/2007, 11/04/2009, 09/22/2010  . Influenza,inj,Quad PF,6+ Mos 08/09/2013, 07/22/2014, 11/17/2015, 01/04/2018, 10/03/2018, 08/28/2019, 08/05/2020  . Janssen (J&J) SARS-COV-2 Vaccination 05/20/2020  . Pneumococcal Conjugate-13 04/25/2018  . Pneumococcal Polysaccharide-23 07/31/2009, 07/05/2016  . Td 03/26/2009    TDAP status: Due, Education has been provided regarding the importance of this vaccine. Advised may receive this vaccine at local pharmacy or Health Dept. Aware to provide a copy of the vaccination record if obtained from local pharmacy or Health Dept. Verbalized acceptance and understanding.  Flu  Vaccine status: Up to date  Pneumococcal vaccine status: Up to  date  Covid-19 vaccine status: Completed vaccines  Qualifies for Shingles Vaccine? No   Zostavax completed No   Shingrix Completed?: No.    Education has been provided regarding the importance of this vaccine. Patient has been advised to call insurance company to determine out of pocket expense if they have not yet received this vaccine. Advised may also receive vaccine at local pharmacy or Health Dept. Verbalized acceptance and understanding.  Screening Tests Health Maintenance  Topic Date Due  . OPHTHALMOLOGY EXAM  Never done  . COLONOSCOPY  12/17/2018  . COVID-19 Vaccine (2 - Booster for Janssen series) 07/15/2020  . TETANUS/TDAP  11/26/2020 (Originally 03/27/2019)  . HEMOGLOBIN A1C  04/30/2021  . FOOT EXAM  08/05/2021  . MAMMOGRAM  12/09/2021  . PAP SMEAR-Modifier  10/29/2022  . INFLUENZA VACCINE  Completed  . PNEUMOCOCCAL POLYSACCHARIDE VACCINE AGE 51-64 HIGH RISK  Completed  . Hepatitis C Screening  Completed  . HIV Screening  Completed    Health Maintenance  Health Maintenance Due  Topic Date Due  . OPHTHALMOLOGY EXAM  Never done  . COLONOSCOPY  12/17/2018  . COVID-19 Vaccine (2 - Booster for Janssen series) 07/15/2020    Colorectal cancer screening: Referral to GI placed She told them she would call back to reschedule. Pt aware the office will call re: appt.  Mammogram status: Completed 12-10-19. Repeat every year    Lung Cancer Screening: (Low Dose CT Chest recommended if Age 53-80 years, 30 pack-year currently smoking OR have quit w/in 15years.) does qualify.   Lung Cancer Screening Referral: does not want at this time   Additional Screening:  Hepatitis C Screening: does not qualify; Completed   Vision Screening: Recommended annual ophthalmology exams for early detection of glaucoma and other disorders of the eye. Is the patient up to date with their annual eye exam?  No  Who is the provider or what is the name of the office in which the patient attends annual  eye exams? [facility] If pt is not established with a provider, would they like to be referred to a provider to establish care? Yes .   Dental Screening: Recommended annual dental exams for proper oral hygiene  Community Resource Referral / Chronic Care Management: CRR required this visit?  No   CCM required this visit?  No      Plan:     I have personally reviewed and noted the following in the patient's chart:   . Medical and social history . Use of alcohol, tobacco or illicit drugs  . Current medications and supplements . Functional ability and status . Nutritional status . Physical activity . Advanced directives . List of other physicians . Hospitalizations, surgeries, and ER visits in previous 12 months . Vitals . Screenings to include cognitive, depression, and falls . Referrals and appointments  In addition, I have reviewed and discussed with patient certain preventive protocols, quality metrics, and best practice recommendations. A written personalized care plan for preventive services as well as general preventive health recommendations were provided to patient.     Shelda Altes, CMA   11/12/2020   Date:  11/12/2020   Location of Patient: Home Location of Provider: Office Consent was obtain for visit to be over via telehealth. I verified that I am speaking with the correct person using two identifiers.  I connected with  Leslie Lyons on 11/12/20 via telephone and verified that  I am speaking with the correct person using two identifiers.   I discussed the limitations of evaluation and management by telemedicine. The patient expressed understanding and agreed to proceed.  AWV questions answered by Leslie Lyons, CMA.  Pt declined call from me to address any concerns.  Time spent: 20 minutes

## 2020-11-12 NOTE — Patient Instructions (Signed)
Leslie Lyons , Thank you for taking time to come for your Medicare Wellness Visit. I appreciate your ongoing commitment to your health goals. Please review the following plan we discussed and let me know if I can assist you in the future.   Screening recommendations/referrals: Colonoscopy: Due  Mammogram: Scheduled; 12/15/20  Bone Density: Completed  Recommended yearly ophthalmology/optometry visit for glaucoma screening and checkup Recommended yearly dental visit for hygiene and checkup  Vaccinations: Influenza vaccine: Completed  Pneumococcal vaccine: Completed  Tdap vaccine: Due  Shingles vaccine: Education provided    Advanced directives: N/A   Conditions/risks identified: None   Next appointment: 03/03/21 @ 9:20am Dr. Moshe Cipro   Preventive Care 40-64 Years, Female Preventive care refers to lifestyle choices and visits with your health care provider that can promote health and wellness. What does preventive care include?  A yearly physical exam. This is also called an annual well check.  Dental exams once or twice a year.  Routine eye exams. Ask your health care provider how often you should have your eyes checked.  Personal lifestyle choices, including:  Daily care of your teeth and gums.  Regular physical activity.  Eating a healthy diet.  Avoiding tobacco and drug use.  Limiting alcohol use.  Practicing safe sex.  Taking low-dose aspirin daily starting at age 74.  Taking vitamin and mineral supplements as recommended by your health care provider. What happens during an annual well check? The services and screenings done by your health care provider during your annual well check will depend on your age, overall health, lifestyle risk factors, and family history of disease. Counseling  Your health care provider may ask you questions about your:  Alcohol use.  Tobacco use.  Drug use.  Emotional well-being.  Home and relationship well-being.  Sexual  activity.  Eating habits.  Work and work Statistician.  Method of birth control.  Menstrual cycle.  Pregnancy history. Screening  You may have the following tests or measurements:  Height, weight, and BMI.  Blood pressure.  Lipid and cholesterol levels. These may be checked every 5 years, or more frequently if you are over 47 years old.  Skin check.  Lung cancer screening. You may have this screening every year starting at age 62 if you have a 30-pack-year history of smoking and currently smoke or have quit within the past 15 years.  Fecal occult blood test (FOBT) of the stool. You may have this test every year starting at age 93.  Flexible sigmoidoscopy or colonoscopy. You may have a sigmoidoscopy every 5 years or a colonoscopy every 10 years starting at age 45.  Hepatitis C blood test.  Hepatitis B blood test.  Sexually transmitted disease (STD) testing.  Diabetes screening. This is done by checking your blood sugar (glucose) after you have not eaten for a while (fasting). You may have this done every 1-3 years.  Mammogram. This may be done every 1-2 years. Talk to your health care provider about when you should start having regular mammograms. This may depend on whether you have a family history of breast cancer.  BRCA-related cancer screening. This may be done if you have a family history of breast, ovarian, tubal, or peritoneal cancers.  Pelvic exam and Pap test. This may be done every 3 years starting at age 18. Starting at age 44, this may be done every 5 years if you have a Pap test in combination with an HPV test.  Bone density scan. This is done to screen  for osteoporosis. You may have this scan if you are at high risk for osteoporosis. Discuss your test results, treatment options, and if necessary, the need for more tests with your health care provider. Vaccines  Your health care provider may recommend certain vaccines, such as:  Influenza vaccine. This is  recommended every year.  Tetanus, diphtheria, and acellular pertussis (Tdap, Td) vaccine. You may need a Td booster every 10 years.  Zoster vaccine. You may need this after age 49.  Pneumococcal 13-valent conjugate (PCV13) vaccine. You may need this if you have certain conditions and were not previously vaccinated.  Pneumococcal polysaccharide (PPSV23) vaccine. You may need one or two doses if you smoke cigarettes or if you have certain conditions. Talk to your health care provider about which screenings and vaccines you need and how often you need them. This information is not intended to replace advice given to you by your health care provider. Make sure you discuss any questions you have with your health care provider. Document Released: 12/12/2015 Document Revised: 08/04/2016 Document Reviewed: 09/16/2015 Elsevier Interactive Patient Education  2017 [place] Prevention in the Home Falls can cause injuries. They can happen to people of all ages. There are many things you can do to make your home safe and to help prevent falls. What can I do on the outside of my home?  Regularly fix the edges of walkways and driveways and fix any cracks.  Remove anything that might make you trip as you walk through a door, such as a raised step or threshold.  Trim any bushes or trees on the path to your home.  Use bright outdoor lighting.  Clear any walking paths of anything that might make someone trip, such as rocks or tools.  Regularly check to see if handrails are loose or broken. Make sure that both sides of any steps have handrails.  Any raised decks and porches should have guardrails on the edges.  Have any leaves, snow, or ice cleared regularly.  Use sand or salt on walking paths during winter.  Clean up any spills in your garage right away. This includes oil or grease spills. What can I do in the bathroom?  Use night lights.  Install grab bars by the toilet and in  the tub and shower. Do not use towel bars as grab bars.  Use non-skid mats or decals in the tub or shower.  If you need to sit down in the shower, use a plastic, non-slip stool.  Keep the floor dry. Clean up any water that spills on the floor as soon as it happens.  Remove soap buildup in the tub or shower regularly.  Attach bath mats securely with double-sided non-slip rug tape.  Do not have throw rugs and other things on the floor that can make you trip. What can I do in the bedroom?  Use night lights.  Make sure that you have a light by your bed that is easy to reach.  Do not use any sheets or blankets that are too big for your bed. They should not hang down onto the floor.  Have a firm chair that has side arms. You can use this for support while you get dressed.  Do not have throw rugs and other things on the floor that can make you trip. What can I do in the kitchen?  Clean up any spills right away.  Avoid walking on wet floors.  Keep items that you use  a lot in easy-to-reach places.  If you need to reach something above you, use a strong step stool that has a grab bar.  Keep electrical cords out of the way.  Do not use floor polish or wax that makes floors slippery. If you must use wax, use non-skid floor wax.  Do not have throw rugs and other things on the floor that can make you trip. What can I do with my stairs?  Do not leave any items on the stairs.  Make sure that there are handrails on both sides of the stairs and use them. Fix handrails that are broken or loose. Make sure that handrails are as long as the stairways.  Check any carpeting to make sure that it is firmly attached to the stairs. Fix any carpet that is loose or worn.  Avoid having throw rugs at the top or bottom of the stairs. If you do have throw rugs, attach them to the floor with carpet tape.  Make sure that you have a light switch at the top of the stairs and the bottom of the stairs. If  you do not have them, ask someone to add them for you. What else can I do to help prevent falls?  Wear shoes that:  Do not have high heels.  Have rubber bottoms.  Are comfortable and fit you well.  Are closed at the toe. Do not wear sandals.  If you use a stepladder:  Make sure that it is fully opened. Do not climb a closed stepladder.  Make sure that both sides of the stepladder are locked into place.  Ask someone to hold it for you, if possible.  Clearly mark and make sure that you can see:  Any grab bars or handrails.  First and last steps.  Where the edge of each step is.  Use tools that help you move around (mobility aids) if they are needed. These include:  Canes.  Walkers.  Scooters.  Crutches.  Turn on the lights when you go into a dark area. Replace any light bulbs as soon as they burn out.  Set up your furniture so you have a clear path. Avoid moving your furniture around.  If any of your floors are uneven, fix them.  If there are any pets around you, be aware of where they are.  Review your medicines with your doctor. Some medicines can make you feel dizzy. This can increase your chance of falling. Ask your doctor what other things that you can do to help prevent falls. This information is not intended to replace advice given to you by your health care provider. Make sure you discuss any questions you have with your health care provider. Document Released: 09/11/2009 Document Revised: 04/22/2016 Document Reviewed: 12/20/2014 Elsevier Interactive Patient Education  2017 [employer].

## 2020-11-13 NOTE — Telephone Encounter (Signed)
Pt advised of medication was sent to [place] with verbal understanding

## 2020-12-02 ENCOUNTER — Other Ambulatory Visit: Payer: Self-pay | Admitting: Family Medicine

## 2020-12-15 ENCOUNTER — Ambulatory Visit (HOSPITAL_COMMUNITY): Payer: Medicare Other

## 2020-12-18 ENCOUNTER — Other Ambulatory Visit: Payer: Self-pay

## 2020-12-18 ENCOUNTER — Ambulatory Visit (HOSPITAL_COMMUNITY)
Admission: RE | Admit: 2020-12-18 | Discharge: 2020-12-18 | Disposition: A | Discharge status: Home / Self Care | Payer: Medicare Other | Source: Ambulatory Visit | Attending: Family Medicine | Admitting: Family Medicine

## 2020-12-18 DIAGNOSIS — Z1231 Encounter for screening mammogram for malignant neoplasm of breast: Secondary | ICD-10-CM | POA: Insufficient documentation

## 2020-12-28 ENCOUNTER — Other Ambulatory Visit: Payer: Self-pay | Admitting: Family Medicine

## 2020-12-29 ENCOUNTER — Other Ambulatory Visit: Payer: Self-pay

## 2020-12-29 MED ORDER — GLIPIZIDE ER 10 MG PO TB24: ORAL_TABLET | ORAL | 1 refills | Status: DC

## 2021-01-01 DIAGNOSIS — M25579 Pain in unspecified ankle and joints of unspecified foot: Secondary | ICD-10-CM | POA: Diagnosis not present

## 2021-01-01 DIAGNOSIS — M79672 Pain in left foot: Secondary | ICD-10-CM | POA: Diagnosis not present

## 2021-01-01 DIAGNOSIS — M79671 Pain in right foot: Secondary | ICD-10-CM | POA: Diagnosis not present

## 2021-01-01 DIAGNOSIS — M7732 Calcaneal spur, left foot: Secondary | ICD-10-CM | POA: Diagnosis not present

## 2021-01-01 DIAGNOSIS — M7731 Calcaneal spur, right foot: Secondary | ICD-10-CM | POA: Diagnosis not present

## 2021-01-01 DIAGNOSIS — E1140 Type 2 diabetes mellitus with diabetic neuropathy, unspecified: Secondary | ICD-10-CM | POA: Diagnosis not present

## 2021-01-20 DIAGNOSIS — M25579 Pain in unspecified ankle and joints of unspecified foot: Secondary | ICD-10-CM | POA: Diagnosis not present

## 2021-01-20 DIAGNOSIS — M79671 Pain in right foot: Secondary | ICD-10-CM | POA: Diagnosis not present

## 2021-01-20 DIAGNOSIS — M79672 Pain in left foot: Secondary | ICD-10-CM | POA: Diagnosis not present

## 2021-01-30 ENCOUNTER — Other Ambulatory Visit: Payer: Self-pay | Admitting: Family Medicine

## 2021-02-12 DIAGNOSIS — M79671 Pain in right foot: Secondary | ICD-10-CM | POA: Diagnosis not present

## 2021-02-12 DIAGNOSIS — M79672 Pain in left foot: Secondary | ICD-10-CM | POA: Diagnosis not present

## 2021-02-12 DIAGNOSIS — M25579 Pain in unspecified ankle and joints of unspecified foot: Secondary | ICD-10-CM | POA: Diagnosis not present

## 2021-03-03 ENCOUNTER — Ambulatory Visit: Payer: Medicare Other | Admitting: Family Medicine

## 2021-03-09 ENCOUNTER — Emergency Department (HOSPITAL_COMMUNITY): Payer: Medicare Other

## 2021-03-09 ENCOUNTER — Emergency Department (HOSPITAL_COMMUNITY)
Admission: EM | Admit: 2021-03-09 | Discharge: 2021-03-09 | Disposition: A | Discharge status: Home / Self Care | Payer: Medicare Other | Attending: Emergency Medicine | Admitting: Emergency Medicine

## 2021-03-09 ENCOUNTER — Other Ambulatory Visit: Payer: Self-pay

## 2021-03-09 ENCOUNTER — Encounter (HOSPITAL_COMMUNITY): Payer: Self-pay | Admitting: Emergency Medicine

## 2021-03-09 DIAGNOSIS — R5383 Other fatigue: Secondary | ICD-10-CM | POA: Insufficient documentation

## 2021-03-09 DIAGNOSIS — I129 Hypertensive chronic kidney disease with stage 1 through stage 4 chronic kidney disease, or unspecified chronic kidney disease: Secondary | ICD-10-CM | POA: Insufficient documentation

## 2021-03-09 DIAGNOSIS — R0789 Other chest pain: Secondary | ICD-10-CM | POA: Diagnosis not present

## 2021-03-09 DIAGNOSIS — E1122 Type 2 diabetes mellitus with diabetic chronic kidney disease: Secondary | ICD-10-CM | POA: Insufficient documentation

## 2021-03-09 DIAGNOSIS — J45909 Unspecified asthma, uncomplicated: Secondary | ICD-10-CM | POA: Insufficient documentation

## 2021-03-09 DIAGNOSIS — Z87891 Personal history of nicotine dependence: Secondary | ICD-10-CM | POA: Diagnosis not present

## 2021-03-09 DIAGNOSIS — Z7951 Long term (current) use of inhaled steroids: Secondary | ICD-10-CM | POA: Insufficient documentation

## 2021-03-09 DIAGNOSIS — Z7984 Long term (current) use of oral hypoglycemic drugs: Secondary | ICD-10-CM | POA: Diagnosis not present

## 2021-03-09 DIAGNOSIS — Z8541 Personal history of malignant neoplasm of cervix uteri: Secondary | ICD-10-CM | POA: Diagnosis not present

## 2021-03-09 DIAGNOSIS — R519 Headache, unspecified: Secondary | ICD-10-CM | POA: Diagnosis not present

## 2021-03-09 DIAGNOSIS — N182 Chronic kidney disease, stage 2 (mild): Secondary | ICD-10-CM | POA: Diagnosis not present

## 2021-03-09 DIAGNOSIS — Z79899 Other long term (current) drug therapy: Secondary | ICD-10-CM | POA: Insufficient documentation

## 2021-03-09 DIAGNOSIS — R079 Chest pain, unspecified: Secondary | ICD-10-CM | POA: Diagnosis not present

## 2021-03-09 LAB — CBC WITH DIFFERENTIAL/PLATELET
Abs Immature Granulocytes: 0.02 10*3/uL (ref 0.00–0.07)
Basophils Absolute: 0 10*3/uL (ref 0.0–0.1)
Basophils Relative: 0 %
Eosinophils Absolute: 0 10*3/uL (ref 0.0–0.5)
Eosinophils Relative: 1 %
HCT: 37.2 % (ref 36.0–46.0)
Hemoglobin: 12.2 g/dL (ref 12.0–15.0)
Immature Granulocytes: 0 %
Lymphocytes Relative: 33 %
Lymphs Abs: 2.5 10*3/uL (ref 0.7–4.0)
MCH: 28.2 pg (ref 26.0–34.0)
MCHC: 32.8 g/dL (ref 30.0–36.0)
MCV: 85.9 fL (ref 80.0–100.0)
Monocytes Absolute: 0.5 10*3/uL (ref 0.1–1.0)
Monocytes Relative: 7 %
Neutro Abs: 4.5 10*3/uL (ref 1.7–7.7)
Neutrophils Relative %: 59 %
Platelets: ADEQUATE 10*3/uL (ref 150–400)
RBC: 4.33 MIL/uL (ref 3.87–5.11)
RDW: 14.2 % (ref 11.5–15.5)
WBC: 7.6 10*3/uL (ref 4.0–10.5)
nRBC: 0 % (ref 0.0–0.2)

## 2021-03-09 LAB — URINALYSIS, ROUTINE W REFLEX MICROSCOPIC
Bilirubin Urine: NEGATIVE
Glucose, UA: NEGATIVE mg/dL
Ketones, ur: NEGATIVE mg/dL
Leukocytes,Ua: NEGATIVE
Nitrite: NEGATIVE
Protein, ur: NEGATIVE mg/dL
Specific Gravity, Urine: 1.005 (ref 1.005–1.030)
pH: 6 (ref 5.0–8.0)

## 2021-03-09 LAB — RAPID URINE DRUG SCREEN, HOSP PERFORMED
Amphetamines: NOT DETECTED
Barbiturates: NOT DETECTED
Benzodiazepines: POSITIVE — AB
Cocaine: NOT DETECTED
Opiates: NOT DETECTED
Tetrahydrocannabinol: NOT DETECTED

## 2021-03-09 LAB — COMPREHENSIVE METABOLIC PANEL
ALT: 21 U/L (ref 0–44)
AST: 25 U/L (ref 15–41)
Albumin: 4.1 g/dL (ref 3.5–5.0)
Alkaline Phosphatase: 62 U/L (ref 38–126)
Anion gap: 12 (ref 5–15)
BUN: 13 mg/dL (ref 6–20)
CO2: 26 mmol/L (ref 22–32)
Calcium: 9.3 mg/dL (ref 8.9–10.3)
Chloride: 98 mmol/L (ref 98–111)
Creatinine, Ser: 0.85 mg/dL (ref 0.44–1.00)
GFR, Estimated: >60 mL/min (ref >60)
Glucose, Bld: 144 mg/dL — ABNORMAL HIGH (ref 70–99)
Potassium: 3.2 mmol/L — ABNORMAL LOW (ref 3.5–5.1)
Sodium: 136 mmol/L (ref 135–145)
Total Bilirubin: 0.6 mg/dL (ref 0.3–1.2)
Total Protein: 7.9 g/dL (ref 6.5–8.1)

## 2021-03-09 LAB — ACETAMINOPHEN LEVEL: Acetaminophen (Tylenol), Serum: <10 ug/mL — ABNORMAL LOW (ref 10–30)

## 2021-03-09 LAB — TROPONIN I (HIGH SENSITIVITY)
Troponin I (High Sensitivity): 12 ng/L (ref <18)
Troponin I (High Sensitivity): 15 ng/L (ref <18)

## 2021-03-09 LAB — SALICYLATE LEVEL: Salicylate Lvl: <7 mg/dL — ABNORMAL LOW (ref 7.0–30.0)

## 2021-03-09 LAB — LIPASE, BLOOD: Lipase: 32 U/L (ref 11–51)

## 2021-03-09 MED ORDER — KETOROLAC TROMETHAMINE 30 MG/ML IJ SOLN
30.0000 mg | Freq: Once | INTRAMUSCULAR | Status: AC
  Administered 2021-03-09: 30 mg via INTRAVENOUS
  Filled 2021-03-09: qty 1

## 2021-03-09 NOTE — Discharge Instructions (Addendum)
Your work-up today was reassuring.  Please call Dr. Griffin Dakin office to arrange a follow-up appointment.  Return emergency department if you develop any worsening symptoms.

## 2021-03-09 NOTE — ED Provider Notes (Signed)
[facility] EMERGENCY DEPARTMENT Provider Note   CSN: [id number] Arrival date & time: 03/09/21  0847     History Chief Complaint  Patient presents with  . multiple    Multiple complaint.  Not able to give direct reason for coming to emergency.  Pt says she normally see Dr Moshe Cipro.  Pt is drowsy.  Pt took all her scheduled medication this am.  Have not taken any pain medication today.      MAKEYLA GOVAN is a 60 y.o. female.  HPI      TEEGHAN HAMMER is a 60 y.o. female with past medical history of anxiety, diabetes, frequent headaches, and hypertension who presents to the Emergency Department from home reporting multiple complaints.  She reports having pain "all over" and had an episode of upper chest pain with sharp pain radiating into her right arm last evening.  She describes a "shooting" pain that shot through her right arm that was brief in duration.  She also reports diffuse frontal headache for several days.  Worse last evening.  She states that she has had difficulty sleeping for several days and recently discontinued her Xanax and started her on trazodone.  She states this medication is not helping her to sleep.  She reports feeling fatigued.  Last evening, she admits to taking 2 trazodone and 2 Xanax as to fall asleep.  She denies any suicidal thoughts or plan.  She denies shortness of breath, fever, chills, illness, increased swelling of her extremities.  She does admit to having numbness and burning pain to both lower extremities and she is currently seeing a podiatrist and receiving injections in her foot.  She has been told that she likely has neuropathy.    Past Medical History:  Diagnosis Date  . Abnormal transaminases   . Anxiety   . Bronchial asthma    with acute exacerbation  . Cancer ([place]) 1980   cervical, diag  at age 3  . Depression   . Diabetes ([place])   . Headache(784.0)    with reduced vision in right eye for one month   . Hyperlipidemia   .  Hypertension   . Obesity     Patient Active Problem List   Diagnosis Date Noted  . Hypokalemia 04/22/2020  . Psychosis ([place]) 04/22/2020  . Abnormal LFTs 10/31/2018  . NASH (nonalcoholic steatohepatitis) 10/25/2018  . Contraindication to statin medication 10/25/2018  . Type 2 diabetes mellitus with stage 2 chronic kidney disease, with long-term current use of insulin ([place]) 07/05/2016  . Morbid obesity ([place]) 08/09/2013  . Ovarian cyst, left 07/06/2013  . Headache 07/06/2013  . Hx of cervical cancer 07/06/2013  . Urinary incontinence 11/20/2012  . Back pain, chronic 06/26/2011  . Vitamin D deficiency 09/29/2010  . Mixed hyperlipidemia 09/29/2010  . Other malaise and fatigue 02/27/2010  . DYSPEPSIA, CHRONIC 01/21/2009  . Essential hypertension, benign 12/08/2008  . External hemorrhoids 12/03/2008  . Asthma in adult 10/06/2008  . Adjustment disorder with mixed anxiety and depressed mood 04/16/2008  . Depression, major, single episode, moderate ([place]) 04/16/2008    Past Surgical History:  Procedure Laterality Date  . ABDOMINAL HYSTERECTOMY    . COLONOSCOPY WITH ESOPHAGOGASTRODUODENOSCOPY (EGD)  11/2008   Dr. Arnoldo Morale: Conscious sedation.  Hiatal hernia.  Hemorrhoids. PATIENT REPORTS BEING AWAKE DURING PROCEDURE  . CRYOTHERAPY     for abnormal pap  . HEMORRHOID SURGERY     dr. Irving Shows  . PARTIAL HYSTERECTOMY       OB  History   No obstetric history on file.     Family History  Problem Relation Age of Onset  . Cancer Mother        lung   . COPD Father        emphysema  . Hypertension Father   . Cancer Maternal Aunt        breast  . Cancer Paternal Uncle   . Colon cancer Neg Hx     Social History   Tobacco Use  . Smoking status: Former Smoker    Packs/day: 0.25    Years: 2.00    Pack years: 0.50    Types: Cigarettes    Quit date: 12/04/2015    Years since quitting: 5.2  . Smokeless tobacco: Never Used  Vaping Use  . Vaping Use: Never used  Substance Use  Topics  . Alcohol use: No  . Drug use: No    Home Medications Prior to Admission medications   Medication Sig Start Date End Date Taking? Authorizing Provider  albuterol (PROAIR HFA) 108 (90 Base) MCG/ACT inhaler Inhale 2 puffs into the lungs every 6 (six) hours as needed. 05/28/20  Yes [name], MD  amLODipine (NORVASC) 5 MG tablet Take 1 tablet by mouth once daily 01/30/21  Yes [name], MD  Cholecalciferol (VITAMIN D3) 125 MCG (5000 UT) CAPS Take 1 capsule (5,000 Units total) by mouth daily. 11/28/18  Yes Cassandria Anger, MD  ezetimibe (ZETIA) 10 MG tablet Take 1 tablet (10 mg total) by mouth daily. 10/30/20  Yes [name], MD  glipiZIDE (GLUCOTROL XL) 10 MG 24 hr tablet Take 1 tablet by mouth once daily with breakfast 12/29/20  Yes [name], MD  mometasone-formoterol [facility]) 200-5 MCG/ACT AERO Inhale 2 puffs into the lungs 2 (two) times daily. 05/27/20  Yes [name], MD  atorvastatin (LIPITOR) 20 MG tablet Take 1 tablet (20 mg total) by mouth daily. Patient not taking: No sig reported 09/29/20   [name], MD  blood glucose meter kit and supplies Dispense based on patient and insurance preference. Test blood sugar once daily before breakfast. (FOR ICD-10 E10.9, E11.9). 10/30/20   [name], MD  gabapentin (NEURONTIN) 100 MG capsule Take 1 capsule (100 mg total) by mouth 3 (three) times daily. Patient not taking: No sig reported 11/12/20   [name], MD  hydrOXYzine (ATARAX/VISTARIL) 25 MG tablet Take 25 mg by mouth every 6 (six) hours as needed. Patient not taking: No sig reported    [provider]  lisinopril (ZESTRIL) 10 MG tablet Take 1 tablet (10 mg total) by mouth daily. Patient not taking: No sig reported 05/20/20   [name], MD  mometasone (NASONEX) 50 MCG/ACT nasal spray Place 2 sprays into the nose daily. Patient not taking: No sig reported 06/20/18   [name], MD   risperidone (RISPERDAL) 4 MG tablet TAKE 1 TABLET BY MOUTH AT BEDTIME Patient not taking: No sig reported 08/06/20   [name], MD  tolterodine (DETROL LA) 2 MG 24 hr capsule Take 1 capsule (2 mg total) by mouth daily. Patient not taking: No sig reported 06/20/18   [name], MD  topiramate (TOPAMAX) 100 MG tablet Take 1 tablet (100 mg total) by mouth 2 (two) times daily. Patient not taking: No sig reported 05/20/20   [name], MD    Allergies    Janumet [sitagliptin-metformin hcl] and Robaxin [methocarbamol]  Review of Systems  Review of Systems  Constitutional: Negative for chills, fatigue and fever.  HENT: Negative for sore throat and trouble swallowing.   Respiratory: Positive for cough. Negative for shortness of breath.   Cardiovascular: Positive for chest pain. Negative for palpitations.  Gastrointestinal: Negative for abdominal pain, diarrhea, nausea and vomiting.  Genitourinary: Negative for dysuria, flank pain and hematuria.  Musculoskeletal: Negative for arthralgias, back pain, myalgias, neck pain and neck stiffness.  Skin: Negative for color change and rash.  Neurological: Positive for headaches. Negative for dizziness, seizures, syncope, facial asymmetry, speech difficulty, weakness and numbness.  Hematological: Does not bruise/bleed easily.  Psychiatric/Behavioral: Negative for confusion.    Physical Exam Updated Vital Signs BP (!) 151/82   Pulse (!) 108   Temp 98.5 F (36.9 C) (Oral)   Resp 15   Ht _0  (1.6 m)   Wt 90.7 kg   SpO2 96%   BMI 35.43 kg/m   Physical Exam Constitutional:      Appearance: She is not ill-appearing or toxic-appearing.     Comments: Patient appears somnolent  HENT:     Head: Normocephalic.  Eyes:     Extraocular Movements: Extraocular movements intact.     Conjunctiva/sclera: Conjunctivae normal.     Pupils: Pupils are equal, round, and reactive to light.  Neck:     Thyroid: No thyromegaly.      Meningeal: Kernig's sign absent.  Cardiovascular:     Rate and Rhythm: Normal rate and regular rhythm.     Pulses: Normal pulses.  Pulmonary:     Effort: Pulmonary effort is normal.     Breath sounds: Normal breath sounds. No wheezing.  Abdominal:     Palpations: Abdomen is soft.     Tenderness: There is no abdominal tenderness. There is no guarding or rebound.  Musculoskeletal:        General: Normal range of motion.     Cervical back: Normal range of motion and neck supple.  Skin:    General: Skin is warm.     Capillary Refill: Capillary refill takes less than 2 seconds.     Findings: No rash.  Neurological:     General: No focal deficit present.     Mental Status: She is alert and oriented to person, place, and time.     Sensory: Sensation is intact.     Motor: Motor function is intact.     Coordination: Coordination is intact.     Comments: CN II-XII grossly intact.  Speech clear.  No pronator drift.      ED Results / Procedures / Treatments   Labs (all labs ordered are listed, but only abnormal results are displayed) Labs Reviewed  COMPREHENSIVE METABOLIC PANEL - Abnormal; Notable for the following components:      Result Value   Potassium 3.2 (*)    Glucose, Bld 144 (*)    All other components within normal limits  URINALYSIS, ROUTINE W REFLEX MICROSCOPIC - Abnormal; Notable for the following components:   Color, Urine STRAW (*)    Hgb urine dipstick SMALL (*)    Bacteria, UA RARE (*)    All other components within normal limits  RAPID URINE DRUG SCREEN, HOSP PERFORMED - Abnormal; Notable for the following components:   Benzodiazepines POSITIVE (*)    All other components within normal limits  SALICYLATE LEVEL - Abnormal; Notable for the following components:   Salicylate Lvl <0.6 (*)    All other components within normal limits  ACETAMINOPHEN LEVEL - Abnormal;  Notable for the following components:   Acetaminophen (Tylenol), Serum <10 (*)    All other components  within normal limits  CBC WITH DIFFERENTIAL/PLATELET  LIPASE, BLOOD  TROPONIN I (HIGH SENSITIVITY)  TROPONIN I (HIGH SENSITIVITY)    EKG None   EKG reviewed by Dr. Fulton Reek, w/o acute ischemic changes  Radiology DG Chest 2 View  Result Date: 03/09/2021 CLINICAL DATA:  Chest pain EXAM: CHEST - 2 VIEW COMPARISON:  Apr 26, 2020 FINDINGS: Lungs are clear. Heart is upper normal in size with pulmonary vascularity normal. No adenopathy. No pneumothorax. No bone lesions. IMPRESSION: Lungs clear.  Heart upper normal in size. Electronically Signed   By: Lowella Grip III M.D.   On: 03/09/2021 11:12   CT Head Wo Contrast  Result Date: 03/09/2021 CLINICAL DATA:  Altered mental status EXAM: CT HEAD WITHOUT CONTRAST TECHNIQUE: Contiguous axial images were obtained from the base of the skull through the vertex without intravenous contrast. COMPARISON:  Apr 24, 2019 FINDINGS: Brain: Ventricles and sulci are normal in size and configuration. There is no intracranial mass, hemorrhage, extra-axial fluid collection, or midline shift. The brain parenchyma appears unremarkable. No acute infarct evident. Slight basal ganglia calcification is stable and felt to be physiologic. Vascular: No hyperdense vessel. Slight carotid siphon calcification noted. Skull: Bony calvarium appears intact. Sinuses/Orbits: Visualized paranasal sinuses clear. Visualized orbits appear symmetric bilaterally. Other: Mastoid air cells clear. IMPRESSION: Normal appearing brain parenchyma. No findings indicative of acute infarct. No mass or hemorrhage. Slight arterial vascular calcification noted. Electronically Signed   By: Lowella Grip III M.D.   On: 03/09/2021 11:14    Procedures Procedures   Medications Ordered in ED Medications - No data to display  ED Course  I have reviewed the triage vital signs and the nursing notes.  Pertinent labs & imaging results that were available during my care of the patient were reviewed by  me and considered in my medical decision making (see chart for details).    MDM Rules/Calculators/A&P                          Patient here with multiple complaints, reports having upper chest pain and right arm pain last evening and diffuse headache.  Denies recent illness, fever, chills, admits to taking 2 trazodone and 2 Xanax tablets in order to help her sleep last evening.  She denies any attempts of self-harm or suicide.  On exam, patient appears somnolent but she is answering questions appropriately.  No focal neuro deficits on exam.  No meningeal signs.  Vital signs reassuring.  Will obtain labs, EKG, chest x-ray and CT head  Pt also seen by Dr. Fulton Reek and care plan discussed.   Labs unremarkable, EKG without acute ischemic changes, chest x-ray and CT head without acute change.  Patient has multiple complaints, doubt emergent process.  No acute findings to suggest acute ischemia or acute intracranial abnormality.  Patient adamantly denies suicidal attempt or attempt of self-harm.  She has been observed in the department without complications.  On recheck, she has received IV Toradol for headache and reports her symptoms have improved and she is requesting discharge home.  I feel that this is appropriate.  I have recommended that she follow-up closely with PCP regarding her insomnia.  Family member also at bedside they both agree to plan.  Strict return precautions were discussed.    Final Clinical Impression(s) / ED Diagnoses Final diagnoses:  Atypical chest  pain  Headache disorder    Rx / DC Orders ED Discharge Orders    None       Kem Parkinson, PA-C 03/09/21 1431    Horton, Alvin Critchley, DO 03/09/21 1600

## 2021-03-10 ENCOUNTER — Other Ambulatory Visit: Payer: Self-pay | Admitting: Family Medicine

## 2021-03-11 LAB — URINE CULTURE

## 2021-03-12 ENCOUNTER — Ambulatory Visit (INDEPENDENT_AMBULATORY_CARE_PROVIDER_SITE_OTHER): Payer: Medicare Other | Admitting: Family Medicine

## 2021-03-12 ENCOUNTER — Encounter: Payer: Self-pay | Admitting: Family Medicine

## 2021-03-12 ENCOUNTER — Other Ambulatory Visit: Payer: Self-pay

## 2021-03-12 VITALS — BP 150/92 | HR 106 | Temp 97.6°F | Ht 63.0 in | Wt 211.0 lb

## 2021-03-12 DIAGNOSIS — N182 Chronic kidney disease, stage 2 (mild): Secondary | ICD-10-CM | POA: Diagnosis not present

## 2021-03-12 DIAGNOSIS — E782 Mixed hyperlipidemia: Secondary | ICD-10-CM | POA: Diagnosis not present

## 2021-03-12 DIAGNOSIS — F4323 Adjustment disorder with mixed anxiety and depressed mood: Secondary | ICD-10-CM

## 2021-03-12 DIAGNOSIS — I10 Essential (primary) hypertension: Secondary | ICD-10-CM | POA: Diagnosis not present

## 2021-03-12 DIAGNOSIS — Z794 Long term (current) use of insulin: Secondary | ICD-10-CM

## 2021-03-12 DIAGNOSIS — E1122 Type 2 diabetes mellitus with diabetic chronic kidney disease: Secondary | ICD-10-CM

## 2021-03-12 MED ORDER — HYDROXYZINE PAMOATE 50 MG PO CAPS: ORAL_CAPSULE | ORAL | 2 refills | Status: DC

## 2021-03-12 MED ORDER — ATORVASTATIN CALCIUM 20 MG PO TABS: 20.0000 mg | ORAL_TABLET | Freq: Every day | ORAL | 5 refills | Status: DC

## 2021-03-12 MED ORDER — AMLODIPINE BESYLATE 10 MG PO TABS: 10.0000 mg | ORAL_TABLET | Freq: Every day | ORAL | 4 refills | Status: DC

## 2021-03-12 NOTE — Assessment & Plan Note (Signed)
Leslie Lyons is reminded of the importance of commitment to daily physical activity for 30 minutes or more, as able and the need to limit carbohydrate intake to 30 to 60 grams per meal to help with blood sugar control.   The need to take medication as prescribed, test blood sugar as directed, and to call between visits if there is a concern that blood sugar is uncontrolled is also discussed.   Leslie Lyons is reminded of the importance of daily foot exam, annual eye examination, and good blood sugar, blood pressure and cholesterol control.  Diabetic Labs Latest Ref Rng & Units 03/09/2021 10/30/2020 08/05/2020 04/28/2020 04/27/2020  HbA1c 5.7 - 6.4 % - 6.1 6.0(A) - -  Microalbumin Not Estab. ug/mL - - 22.2(H) - -  Micro/Creat Ratio <30 mcg/mg creat - - - - -  Chol 0 - 200 mg/dL - - 147 - -  HDL >40 mg/dL - - 51 - -  Calc LDL 0 - 99 mg/dL - - 71 - -  Triglycerides <150 mg/dL - - 126 - -  Creatinine 0.44 - 1.00 mg/dL 0.85 - 0.87 0.94 1.08(H)   BP/Weight 03/12/2021 03/09/2021 10/30/2020 08/05/2020 05/20/2020 04/28/2020 7/34/0370  Systolic BP 964 383 818 403 754 360 677  Diastolic BP 83 80 82 56 77 67 98  Wt. (Lbs) 211 200 200 198.04 208 216.27 227  BMI 37.38 35.43 35.43 35.08 36.85 38.31 40.21   Foot/eye exam completion dates 08/05/2020 08/28/2019  Foot Form Completion Done Done    Refer opthal HBA1C today in office

## 2021-03-12 NOTE — Progress Notes (Signed)
Leslie Lyons     MRN: [id number]      DOB: January 05, 1961   HPI Leslie Lyons is here for follow up and re-evaluation of chronic medical conditions, medication management and review of any available recent lab and radiology data.  Preventive health is updated, specifically  Cancer screening and Immunization.   The PT denies any adverse reactions to current medications since the last visit.  Denies polyuria, polydipsia, blurred vision , or hypoglycemic episodes. Not testing regularly Wants to get better and is confused as to why and if she needs Psychiatry  ROS Denies recent fever or chills. Denies sinus pressure, nasal congestion, ear pain or sore throat. Denies chest congestion, productive cough or wheezing. Denies chest pains, palpitations and leg swelling Denies abdominal pain, nausea, vomiting,diarrhea or constipation.   Denies dysuria, frequency, hesitancy does have  incontinence. Denies joint pain, swelling and limitation in mobility. Denies headaches, seizures, numbness, or tingling. C/o  anxiety and  insomnia. Denies skin break down or rash.   PE  BP (!) 147/83 (BP Location: Right Arm, Patient Position: Sitting, Cuff Size: Normal)   Pulse (!) 106   Temp 97.6 F (36.4 C) (Temporal)   Ht 5' 3"  (1.6 m)   Wt 211 lb (95.7 kg)   SpO2 97%   BMI 37.38 kg/m   Patient alert and oriented and in no cardiopulmonary distress.  HEENT: No facial asymmetry, EOMI,     Neck supple .  Chest: Clear to auscultation bilaterally.  CVS: S1, S2 no murmurs, no S3.Regular rate.  ABD: Soft non tender.   Ext: No edema  MS: Adequate ROM spine, shoulders, hips and knees.  Skin: Intact, no ulcerations or rash noted.  Psych: Good eye contact, normal affect. Memory intact not anxious or depressed appearing.  CNS: CN 2-12 intact, power,  normal throughout.no focal deficits noted.   Assessment & Plan  Type 2 diabetes mellitus with stage 2 chronic kidney disease, with long-term current  use of insulin ([place]) Leslie Lyons is reminded of the importance of commitment to daily physical activity for 30 minutes or more, as able and the need to limit carbohydrate intake to 30 to 60 grams per meal to help with blood sugar control.   The need to take medication as prescribed, test blood sugar as directed, and to call between visits if there is a concern that blood sugar is uncontrolled is also discussed.   Leslie Lyons is reminded of the importance of daily foot exam, annual eye examination, and good blood sugar, blood pressure and cholesterol control.  Diabetic Labs Latest Ref Rng & Units 03/09/2021 10/30/2020 08/05/2020 04/28/2020 04/27/2020  HbA1c 5.7 - 6.4 % - 6.1 6.0(A) - -  Microalbumin Not Estab. ug/mL - - 22.2(H) - -  Micro/Creat Ratio <30 mcg/mg creat - - - - -  Chol 0 - 200 mg/dL - - 147 - -  HDL >40 mg/dL - - 51 - -  Calc LDL 0 - 99 mg/dL - - 71 - -  Triglycerides <150 mg/dL - - 126 - -  Creatinine 0.44 - 1.00 mg/dL 0.85 - 0.87 0.94 1.08(H)   BP/Weight 03/12/2021 03/09/2021 10/30/2020 08/05/2020 05/20/2020 04/28/2020 01/20/9797  Systolic BP 921 194 174 081 448 185 631  Diastolic BP 83 80 82 56 77 67 98  Wt. (Lbs) 211 200 200 198.04 208 216.27 227  BMI 37.38 35.43 35.43 35.08 36.85 38.31 40.21   Foot/eye exam completion dates 08/05/2020 08/28/2019  Foot Form Completion Done Done  Refer opthal HBA1C today in office    Essential hypertension, benign Uncontrolled, increase amlodipine dose and re assess DASH diet and commitment to daily physical activity for a minimum of 30 minutes discussed and encouraged, as a part of hypertension management. The importance of attaining a healthy weight is also discussed.  BP/Weight 03/12/2021 03/09/2021 10/30/2020 08/05/2020 05/20/2020 04/28/2020 0/30/0923  Systolic BP 300 762 263 335 456 256 389  Diastolic BP 92 80 82 56 77 67 98  Wt. (Lbs) 211 200 200 198.04 208 216.27 227  BMI 37.38 35.43 35.43 35.08 36.85 38.31 40.21       Morbid obesity  (HCC)  Patient re-educated about  the importance of commitment to a  minimum of 150 minutes of exercise per week as able.  The importance of healthy food choices with portion control discussed, as well as eating regularly and within a 12 hour window most days. The need to choose "clean , green" food 50 to 75% of the time is discussed, as well as to make water the primary drink and set a goal of 64 ounces water daily.    Weight /BMI 03/12/2021 03/09/2021 10/30/2020  WEIGHT 211 lb 200 lb 200 lb  HEIGHT 5' 3"  5' 3"  5' 3"   BMI 37.38 kg/m2 35.43 kg/m2 35.43 kg/m2      Adjustment disorder with mixed anxiety and depressed mood Uncontrolled anxiety and insomnia, increase dose of hydroxyzine, continue trazodone Sleep hygiene reviewed and written information offered also. Prescription sent for  medication needed.   Mixed hyperlipidemia Hyperlipidemia:Low fat diet discussed and encouraged.   Lipid Panel  Lab Results  Component Value Date   CHOL 147 08/05/2020   HDL 51 08/05/2020   LDLCALC 71 08/05/2020   TRIG 126 08/05/2020   CHOLHDL 2.9 08/05/2020  Updated lab needed at/ before next visit.

## 2021-03-12 NOTE — Patient Instructions (Signed)
Annual physical exam and re evaluate blood pressure in 8 weeks, call if you need me sooner  Excellent blood sugar  Increase amlodipine to 10 mg one daily , blood pressure is high  Increase hydroxyzine to 50 mg twice daily for anxiety  Start atorvastatin for cholesterol  Fasting lipid, cmp and EGFr 5 days before follow up  You are referred for eye exam  Thanks for choosing [facility], we consider it a privelige to serve you.

## 2021-03-13 NOTE — Assessment & Plan Note (Signed)
Hyperlipidemia:Low fat diet discussed and encouraged.   Lipid Panel  Lab Results  Component Value Date   CHOL 147 08/05/2020   HDL 51 08/05/2020   LDLCALC 71 08/05/2020   TRIG 126 08/05/2020   CHOLHDL 2.9 08/05/2020  Updated lab needed at/ before next visit.

## 2021-03-13 NOTE — Assessment & Plan Note (Signed)
Uncontrolled, increase amlodipine dose and re assess DASH diet and commitment to daily physical activity for a minimum of 30 minutes discussed and encouraged, as a part of hypertension management. The importance of attaining a healthy weight is also discussed.  BP/Weight 03/12/2021 03/09/2021 10/30/2020 08/05/2020 05/20/2020 04/28/2020 9/98/0012  Systolic BP 393 594 090 502 561 548 845  Diastolic BP 92 80 82 56 77 67 98  Wt. (Lbs) 211 200 200 198.04 208 216.27 227  BMI 37.38 35.43 35.43 35.08 36.85 38.31 40.21

## 2021-03-13 NOTE — Assessment & Plan Note (Signed)
  Patient re-educated about  the importance of commitment to a  minimum of 150 minutes of exercise per week as able.  The importance of healthy food choices with portion control discussed, as well as eating regularly and within a 12 hour window most days. The need to choose "clean , green" food 50 to 75% of the time is discussed, as well as to make water the primary drink and set a goal of 64 ounces water daily.    Weight /BMI 03/12/2021 03/09/2021 10/30/2020  WEIGHT 211 lb 200 lb 200 lb  HEIGHT 5' 3"  5' 3"  5' 3"   BMI 37.38 kg/m2 35.43 kg/m2 35.43 kg/m2

## 2021-03-13 NOTE — Assessment & Plan Note (Signed)
Uncontrolled anxiety and insomnia, increase dose of hydroxyzine, continue trazodone Sleep hygiene reviewed and written information offered also. Prescription sent for  medication needed.

## 2021-05-11 ENCOUNTER — Encounter: Payer: Medicare Other | Admitting: Family Medicine

## 2021-05-19 ENCOUNTER — Encounter: Payer: Medicare Other | Admitting: Family Medicine

## 2021-06-11 ENCOUNTER — Other Ambulatory Visit: Payer: Self-pay | Admitting: *Deleted

## 2021-06-11 MED ORDER — TOLTERODINE TARTRATE ER 2 MG PO CP24: 2.0000 mg | ORAL_CAPSULE | Freq: Every day | ORAL | 3 refills | Status: DC

## 2021-06-30 ENCOUNTER — Encounter: Payer: Self-pay | Admitting: Family Medicine

## 2021-06-30 ENCOUNTER — Other Ambulatory Visit: Payer: Self-pay

## 2021-06-30 ENCOUNTER — Ambulatory Visit (INDEPENDENT_AMBULATORY_CARE_PROVIDER_SITE_OTHER): Payer: Medicare Other | Admitting: Family Medicine

## 2021-06-30 VITALS — BP 102/68 | HR 124 | Resp 16 | Ht 63.0 in | Wt 210.0 lb

## 2021-06-30 DIAGNOSIS — E559 Vitamin D deficiency, unspecified: Secondary | ICD-10-CM

## 2021-06-30 DIAGNOSIS — E782 Mixed hyperlipidemia: Secondary | ICD-10-CM

## 2021-06-30 DIAGNOSIS — N182 Chronic kidney disease, stage 2 (mild): Secondary | ICD-10-CM | POA: Diagnosis not present

## 2021-06-30 DIAGNOSIS — Z794 Long term (current) use of insulin: Secondary | ICD-10-CM

## 2021-06-30 DIAGNOSIS — Z0000 Encounter for general adult medical examination without abnormal findings: Secondary | ICD-10-CM

## 2021-06-30 DIAGNOSIS — E1122 Type 2 diabetes mellitus with diabetic chronic kidney disease: Secondary | ICD-10-CM

## 2021-06-30 DIAGNOSIS — F5104 Psychophysiologic insomnia: Secondary | ICD-10-CM | POA: Diagnosis not present

## 2021-06-30 DIAGNOSIS — R32 Unspecified urinary incontinence: Secondary | ICD-10-CM | POA: Diagnosis not present

## 2021-06-30 DIAGNOSIS — Z1211 Encounter for screening for malignant neoplasm of colon: Secondary | ICD-10-CM

## 2021-06-30 DIAGNOSIS — I10 Essential (primary) hypertension: Secondary | ICD-10-CM | POA: Diagnosis not present

## 2021-06-30 MED ORDER — TRAZODONE HCL 150 MG PO TABS: 150.0000 mg | ORAL_TABLET | Freq: Every day | ORAL | 3 refills | Status: DC

## 2021-06-30 NOTE — Patient Instructions (Addendum)
Follow-up in 8 weeks with MD to reevaluate sleep call if you need me sooner.Microalb at visit  New higher dose of trazodone is 150 mg 1 at bedtime continue hydroxyzine as before.  Labs today lipid CMP and EGFR and hemoglobin A1c.Vit D, and TSH Please get your shingles vaccines at the pharmacy.  You are referred for colonoscopy, which is overdue,   You are referred for diabetic eye exam, important you get this as your vision is not good  It is important that you exercise regularly at least 30 minutes 5 times a week. If you develop chest pain, have severe difficulty breathing, or feel very tired, stop exercising immediately and seek medical attention  Think about what you will eat, plan ahead. Choose " clean, green, fresh or frozen" over canned, processed or packaged foods which are more sugary, salty and fatty. 70 to 75% of food eaten should be vegetables and fruit. Three meals at set times with snacks allowed between meals, but they must be fruit or vegetables. Aim to eat over a 12 hour period , example 7 am to 7 pm, and STOP after  your last meal of the day. Drink water,generally about 64 ounces per day, no other drink is as healthy. Fruit juice is best enjoyed in a healthy way, by EATING the fruit.  Thanks for choosing [facility], we consider it a privelige to serve you.

## 2021-06-30 NOTE — Progress Notes (Signed)
Leslie Lyons     MRN: [id number]      DOB: 1961/04/02  HPI: Patient is in for annual physical exam. C/o inability to afford incontinence medication Blood pressure revaluated. Recent labs,  are reviewed. Immunization is reviewed , and  updated if needed.   PE: BP 102/68   Pulse (!) 124   Resp 16   SpO2 94%   Pleasant  female, alert and oriented x 3, in no cardio-pulmonary distress. Afebrile. HEENT No facial trauma or asymetry. Sinuses non tender.  Extra occullar muscles intact.. External ears normal, . Neck: supple, no adenopathy,JVD or thyromegaly.No bruits.  Chest: Clear to ascultation bilaterally.No crackles or wheezes. Non tender to palpation  Breast: No asymetry,no masses or lumps. No tenderness. No nipple discharge or inversion. No axillary or supraclavicular adenopathy  Cardiovascular system; Heart sounds normal,  S1 and  S2 ,no S3.  No murmur, or thrill. Apical beat not displaced Peripheral pulses normal.  Abdomen: Soft, non tender, no organomegaly or masses. No bruits. Bowel sounds normal. No guarding, tenderness or rebound.      Musculoskeletal exam: Decreased  ROM of spine, hips , shoulders and knees.  deformity ,swelling and  crepitus noted of knees Neurologic: Cranial nerves 2 to 12 intact. Power, tone ,sensation  normal throughout.  disturbance in gait. No tremor.  Skin: Intact, no ulceration, erythema , scaling or rash noted. Pigmentation normal throughout  Psych; Normal mood and affect.   Assessment & Plan:  Annual physical exam Annual exam as documented. Counseling done  re healthy lifestyle involving commitment to 150 minutes exercise per week, heart healthy diet, and attaining healthy weight.The importance of adequate sleep also discussed. Regular seat belt use and home safety, is also discussed. Changes in health habits are decided on by the patient with goals and time frames  set for achieving them. Immunization and  cancer screening needs are specifically addressed at this visit.   Insomnia Uncontrolled, increase dose of trazodone and re eval in  Weeks Sleep hygiene reviewed and written information offered also. Prescription sent for  medication needed.   Type 2 diabetes mellitus with stage 2 chronic kidney disease, with long-term current use of insulin ([place]) Leslie Lyons is reminded of the importance of commitment to daily physical activity for 30 minutes or more, as able and the need to limit carbohydrate intake to 30 to 60 grams per meal to help with blood sugar control.   The need to take medication as prescribed, test blood sugar as directed, and to call between visits if there is a concern that blood sugar is uncontrolled is also discussed.   Leslie Lyons is reminded of the importance of daily foot exam, annual eye examination, and good blood sugar, blood pressure and cholesterol control. Controlled, no change in medication   Diabetic Labs Latest Ref Rng & Units 06/30/2021 03/09/2021 10/30/2020 08/05/2020 04/28/2020  HbA1c 4.8 - 5.6 % 6.9(H) - 6.1 6.0(A) -  Microalbumin Not Estab. ug/mL - - - 22.2(H) -  Micro/Creat Ratio <30 mcg/mg creat - - - - -  Chol 100 - 199 mg/dL 219(H) - - 147 -  HDL >39 mg/dL 58 - - 51 -  Calc LDL 0 - 99 mg/dL 113(H) - - 71 -  Triglycerides 0 - 149 mg/dL 282(H) - - 126 -  Creatinine 0.57 - 1.00 mg/dL 0.92 0.85 - 0.87 0.94   BP/Weight 06/30/2021 03/12/2021 03/09/2021 10/30/2020 08/05/2020 05/20/2020 6/38/4665  Systolic BP 993 570 177 939 147 126 125  Diastolic BP 68 92 80 82 56 77 67  Wt. (Lbs) - 211 200 200 198.04 208 216.27  BMI - 37.38 35.43 35.43 35.08 36.85 38.31   Foot/eye exam completion dates 08/05/2020 08/28/2019  Foot Form Completion Done Done        Urinary incontinence Uncontrolled as unable to afford medication prescribed,alternative prescribed  Essential hypertension, benign Controlled, no change in medication DASH diet and commitment to daily physical  activity for a minimum of 30 minutes discussed and encouraged, as a part of hypertension management. The importance of attaining a healthy weight is also discussed.  BP/Weight 06/30/2021 03/12/2021 03/09/2021 10/30/2020 08/05/2020 05/20/2020 3/74/8270  Systolic BP 786 754 492 010 071 219 758  Diastolic BP 68 92 80 82 56 77 67  Wt. (Lbs) - 211 200 200 198.04 208 216.27  BMI - 37.38 35.43 35.43 35.08 36.85 38.31

## 2021-07-01 LAB — HEMOGLOBIN A1C
Est. average glucose Bld gHb Est-mCnc: 151 mg/dL
Hgb A1c MFr Bld: 6.9 % — ABNORMAL HIGH (ref 4.8–5.6)

## 2021-07-01 LAB — CMP14+EGFR
ALT: 28 IU/L (ref 0–32)
AST: 25 IU/L (ref 0–40)
Albumin/Globulin Ratio: 1.5 (ref 1.2–2.2)
Albumin: 4.7 g/dL (ref 3.8–4.9)
Alkaline Phosphatase: 98 IU/L (ref 44–121)
BUN/Creatinine Ratio: 12 (ref 12–28)
BUN: 11 mg/dL (ref 8–27)
Bilirubin Total: 0.3 mg/dL (ref 0.0–1.2)
CO2: 23 mmol/L (ref 20–29)
Calcium: 10.1 mg/dL (ref 8.7–10.3)
Chloride: 95 mmol/L — ABNORMAL LOW (ref 96–106)
Creatinine, Ser: 0.92 mg/dL (ref 0.57–1.00)
Globulin, Total: 3.2 g/dL (ref 1.5–4.5)
Glucose: 230 mg/dL — ABNORMAL HIGH (ref 65–99)
Potassium: 3.9 mmol/L (ref 3.5–5.2)
Sodium: 138 mmol/L (ref 134–144)
Total Protein: 7.9 g/dL (ref 6.0–8.5)
eGFR: 71 mL/min/{1.73_m2} (ref >59)

## 2021-07-01 LAB — LIPID PANEL
Chol/HDL Ratio: 3.8 ratio (ref 0.0–4.4)
Cholesterol, Total: 219 mg/dL — ABNORMAL HIGH (ref 100–199)
HDL: 58 mg/dL (ref >39)
LDL Chol Calc (NIH): 113 mg/dL — ABNORMAL HIGH (ref 0–99)
Triglycerides: 282 mg/dL — ABNORMAL HIGH (ref 0–149)
VLDL Cholesterol Cal: 48 mg/dL — ABNORMAL HIGH (ref 5–40)

## 2021-07-01 LAB — TSH: TSH: 2.63 u[IU]/mL (ref 0.450–4.500)

## 2021-07-01 LAB — VITAMIN D 25 HYDROXY (VIT D DEFICIENCY, FRACTURES): Vit D, 25-Hydroxy: 30.7 ng/mL (ref 30.0–100.0)

## 2021-07-02 ENCOUNTER — Other Ambulatory Visit: Payer: Self-pay

## 2021-07-02 ENCOUNTER — Other Ambulatory Visit: Payer: Self-pay | Admitting: Family Medicine

## 2021-07-02 MED ORDER — ATORVASTATIN CALCIUM 40 MG PO TABS: 40.0000 mg | ORAL_TABLET | Freq: Every day | ORAL | 3 refills | Status: DC

## 2021-07-02 MED ORDER — EZETIMIBE 10 MG PO TABS: 10.0000 mg | ORAL_TABLET | Freq: Every day | ORAL | 1 refills | Status: DC

## 2021-07-02 MED ORDER — MIRABEGRON ER 25 MG PO TB24: 25.0000 mg | ORAL_TABLET | Freq: Every day | ORAL | 5 refills | Status: DC

## 2021-07-02 NOTE — Progress Notes (Signed)
Pitor 40

## 2021-07-06 ENCOUNTER — Encounter: Payer: Self-pay | Admitting: Family Medicine

## 2021-07-06 DIAGNOSIS — G4700 Insomnia, unspecified: Secondary | ICD-10-CM | POA: Insufficient documentation

## 2021-07-06 NOTE — Assessment & Plan Note (Signed)
Uncontrolled, increase dose of trazodone and re eval in  Weeks Sleep hygiene reviewed and written information offered also. Prescription sent for  medication needed.

## 2021-07-06 NOTE — Assessment & Plan Note (Signed)

## 2021-07-06 NOTE — Assessment & Plan Note (Signed)
Controlled, no change in medication DASH diet and commitment to daily physical activity for a minimum of 30 minutes discussed and encouraged, as a part of hypertension management. The importance of attaining a healthy weight is also discussed.  BP/Weight 06/30/2021 03/12/2021 03/09/2021 10/30/2020 08/05/2020 05/20/2020 04/23/8947  Systolic BP 347 583 074 600 298 473 085  Diastolic BP 68 92 80 82 56 77 67  Wt. (Lbs) - 211 200 200 198.04 208 216.27  BMI - 37.38 35.43 35.43 35.08 36.85 38.31

## 2021-07-06 NOTE — Assessment & Plan Note (Signed)
Uncontrolled as unable to afford medication prescribed,alternative prescribed

## 2021-07-06 NOTE — Assessment & Plan Note (Signed)
Leslie Lyons is reminded of the importance of commitment to daily physical activity for 30 minutes or more, as able and the need to limit carbohydrate intake to 30 to 60 grams per meal to help with blood sugar control.   The need to take medication as prescribed, test blood sugar as directed, and to call between visits if there is a concern that blood sugar is uncontrolled is also discussed.   Leslie Lyons is reminded of the importance of daily foot exam, annual eye examination, and good blood sugar, blood pressure and cholesterol control. Controlled, no change in medication   Diabetic Labs Latest Ref Rng & Units 06/30/2021 03/09/2021 10/30/2020 08/05/2020 04/28/2020  HbA1c 4.8 - 5.6 % 6.9(H) - 6.1 6.0(A) -  Microalbumin Not Estab. ug/mL - - - 22.2(H) -  Micro/Creat Ratio <30 mcg/mg creat - - - - -  Chol 100 - 199 mg/dL 219(H) - - 147 -  HDL >39 mg/dL 58 - - 51 -  Calc LDL 0 - 99 mg/dL 113(H) - - 71 -  Triglycerides 0 - 149 mg/dL 282(H) - - 126 -  Creatinine 0.57 - 1.00 mg/dL 0.92 0.85 - 0.87 0.94   BP/Weight 06/30/2021 03/12/2021 03/09/2021 10/30/2020 08/05/2020 05/20/2020 12/14/5788  Systolic BP 383 338 329 191 660 600 459  Diastolic BP 68 92 80 82 56 77 67  Wt. (Lbs) - 211 200 200 198.04 208 216.27  BMI - 37.38 35.43 35.43 35.08 36.85 38.31   Foot/eye exam completion dates 08/05/2020 08/28/2019  Foot Form Completion Done Done

## 2021-07-08 ENCOUNTER — Encounter: Payer: Self-pay | Admitting: Internal Medicine

## 2021-08-15 ENCOUNTER — Other Ambulatory Visit: Payer: Self-pay | Admitting: Family Medicine

## 2021-08-25 ENCOUNTER — Ambulatory Visit: Payer: Medicare Other | Admitting: Family Medicine

## 2021-09-04 ENCOUNTER — Ambulatory Visit: Payer: Medicare Other | Admitting: Family Medicine

## 2021-09-24 ENCOUNTER — Ambulatory Visit: Payer: Medicare Other

## 2021-10-21 ENCOUNTER — Ambulatory Visit: Payer: Medicare Other

## 2021-10-23 ENCOUNTER — Ambulatory Visit: Payer: Medicare Other | Admitting: Family Medicine

## 2021-11-02 NOTE — Progress Notes (Deleted)
Referring Provider: [name], MD Primary Care Physician:  [name], MD Primary Gastroenterologist:  Dr. Abbey Chatters  No chief complaint on file.   HPI:   Leslie Lyons is a 60 y.o. female presenting today at the request of [name], MD for colon cancer screening.  Last colonoscopy in 2010 with hemorrhoids.  She has history of mild constipation, chronically elevated transaminases.  RUQ ultrasound suggest steatosis.  Prior serology evaluation - Hepatitis A IgM, hepatitis B surface antigen, hepatitis B core IgM, hepatitis C antibody negative.  ANA positive, AMA, ASMA negative, ferritin elevated, IgG slightly elevated at 1660, IgM mildly elevated at 499.  Dr. Oneida Alar suspected NASH in the setting of significant weight gain.  She was advised to return to the weight of 205 pounds.  Her LFTs had previously been normal.  If unable to lose weight/LFTs do not normalize, proceed with liver biopsy.  Notably, LFTs normalized May 2021 and have remained normal.  Today:   Past Medical History:  Diagnosis Date   Abnormal transaminases    Anxiety    Bronchial asthma    with acute exacerbation   Cancer ([place]) 1980   cervical, diag  at age 64   Depression    Diabetes ([place])    Headache(784.0)    with reduced vision in right eye for one month    Hyperlipidemia    Hypertension    Obesity     Past Surgical History:  Procedure Laterality Date   ABDOMINAL HYSTERECTOMY     COLONOSCOPY WITH ESOPHAGOGASTRODUODENOSCOPY (EGD)  11/2008   Dr. Arnoldo Morale: Conscious sedation.  Hiatal hernia.  Hemorrhoids. PATIENT REPORTS BEING AWAKE DURING PROCEDURE   CRYOTHERAPY     for abnormal pap   HEMORRHOID SURGERY     dr. Irving Shows   PARTIAL HYSTERECTOMY      Current Outpatient Medications  Medication Sig Dispense Refill   albuterol (PROAIR HFA) 108 (90 Base) MCG/ACT inhaler Inhale 2 puffs into the lungs every 6 (six) hours as needed. 18 g 3   amLODipine (NORVASC) 10 MG tablet Take 1  tablet (10 mg total) by mouth daily. 30 tablet 4   atorvastatin (LIPITOR) 40 MG tablet Take 1 tablet (40 mg total) by mouth daily. 90 tablet 3   blood glucose meter kit and supplies Dispense based on patient and insurance preference. Test blood sugar once daily before breakfast. (FOR ICD-10 E10.9, E11.9). 1 each 0   Cholecalciferol (VITAMIN D3) 125 MCG (5000 UT) CAPS Take 1 capsule (5,000 Units total) by mouth daily. 90 capsule 0   ezetimibe (ZETIA) 10 MG tablet Take 1 tablet (10 mg total) by mouth daily. 90 tablet 1   glipiZIDE (GLUCOTROL XL) 10 MG 24 hr tablet Take 1 tablet by mouth once daily with breakfast 90 tablet 0   hydrOXYzine (VISTARIL) 50 MG capsule Take one capsule by mouth two times daily , as  Needed, for anxiety 60 capsule 2   ibuprofen (ADVIL) 600 MG tablet Take 600 mg by mouth 3 (three) times daily.     lisinopril (ZESTRIL) 10 MG tablet Take 1 tablet (10 mg total) by mouth daily. 30 tablet 2   mirabegron ER (MYRBETRIQ) 25 MG TB24 tablet Take 1 tablet (25 mg total) by mouth daily. 30 tablet 5   mometasone (NASONEX) 50 MCG/ACT nasal spray Place 2 sprays into the nose daily. 51 g 3   mometasone-formoterol (DULERA) 200-5 MCG/ACT AERO Inhale 2 puffs into the lungs 2 (two) times daily. 13 g  2   risperidone (RISPERDAL) 4 MG tablet TAKE 1 TABLET BY MOUTH AT BEDTIME 30 tablet 5   topiramate (TOPAMAX) 100 MG tablet Take 1 tablet (100 mg total) by mouth 2 (two) times daily. 30 tablet 2   traZODone (DESYREL) 150 MG tablet Take 1 tablet (150 mg total) by mouth at bedtime. 30 tablet 3   No current facility-administered medications for this visit.    Allergies as of 11/04/2021 - Review Complete 07/06/2021  Allergen Reaction Noted   Janumet [sitagliptin-metformin hcl]  10/03/2018   Robaxin [methocarbamol] Swelling 07/31/2013    Family History  Problem Relation Age of Onset   Cancer Mother        lung    COPD Father        emphysema   Hypertension Father    Cancer Maternal Aunt         breast   Cancer Paternal Uncle    Colon cancer Neg Hx     Social History   Socioeconomic History   Marital status: Married    Spouse name: Not on file   Number of children: 2   Years of education: Not on file   Highest education level: Not on file  Occupational History   Occupation: homemaker   Tobacco Use   Smoking status: Former    Packs/day: 0.25    Years: 2.00    Pack years: 0.50    Types: Cigarettes    Quit date: 12/04/2015    Years since quitting: 5.9   Smokeless tobacco: Never  Vaping Use   Vaping Use: Never used  Substance and Sexual Activity   Alcohol use: No   Drug use: No   Sexual activity: Not Currently  Other Topics Concern   Not on file  Social History Narrative   Lives at home with husband    Social Determinants of Health   Financial Resource Strain: Low Risk    Difficulty of Paying Living Expenses: Not hard at all  Food Insecurity: No Food Insecurity   Worried About Charity fundraiser in the Last Year: Never true   [place] in the Last Year: Never true  Transportation Needs: No Transportation Needs   Lack of Transportation (Medical): No   Lack of Transportation (Non-Medical): No  Physical Activity: Inactive   Days of Exercise per Week: 0 days   Minutes of Exercise per Session: 0 min  Stress: No Stress Concern Present   Feeling of Stress : Not at all  Social Connections: Moderately Integrated   Frequency of Communication with Friends and Family: More than three times a week   Frequency of Social Gatherings with Friends and Family: More than three times a week   Attends Religious Services: 1 to 4 times per year   Active Member of [employer] or Organizations: No   Attends Music therapist: Never   Marital Status: Married  Human resources officer Violence: Not At Risk   Fear of Current or Ex-Partner: No   Emotionally Abused: No   Physically Abused: No   Sexually Abused: No    Review of Systems: Gen: Denies any fever, chills,  fatigue, weight loss, lack of appetite.  CV: Denies chest pain, heart palpitations, peripheral edema, syncope.  Resp: Denies shortness of breath at rest or with exertion. Denies wheezing or cough.  GI: Denies dysphagia or odynophagia. Denies jaundice, hematemesis, fecal incontinence. GU : Denies urinary burning, urinary frequency, urinary hesitancy MS: Denies joint pain, muscle weakness, cramps, or limitation  of movement.  Derm: Denies rash, itching, dry skin Psych: Denies depression, anxiety, memory loss, and confusion Heme: Denies bruising, bleeding, and enlarged lymph nodes.  Physical Exam: There were no vitals taken for this visit. General:   Alert and oriented. Pleasant and cooperative. Well-nourished and well-developed.  Head:  Normocephalic and atraumatic. Eyes:  Without icterus, sclera clear and conjunctiva pink.  Ears:  Normal auditory acuity. Nose:  No deformity, discharge,  or lesions. Mouth:  No deformity or lesions, oral mucosa pink.  Neck:  Supple, without mass or thyromegaly. Lungs:  Clear to auscultation bilaterally. No wheezes, rales, or rhonchi. No distress.  Heart:  S1, S2 present without murmurs appreciated.  Abdomen:  +BS, soft, non-tender and non-distended. No HSM noted. No guarding or rebound. No masses appreciated.  Rectal:  Deferred  Msk:  Symmetrical without gross deformities. Normal posture. Pulses:  Normal pulses noted. Extremities:  Without clubbing or edema. Neurologic:  Alert and  oriented x4;  grossly normal neurologically. Skin:  Intact without significant lesions or rashes. Cervical Nodes:  No significant cervical adenopathy. Psych:  Alert and cooperative. Normal mood and affect.

## 2021-11-04 ENCOUNTER — Encounter: Payer: Self-pay | Admitting: Internal Medicine

## 2021-11-04 ENCOUNTER — Ambulatory Visit: Payer: Medicare Other | Admitting: Gastroenterology

## 2021-11-13 ENCOUNTER — Other Ambulatory Visit: Payer: Self-pay

## 2021-11-13 ENCOUNTER — Ambulatory Visit: Payer: Medicare Other | Admitting: Family Medicine

## 2021-11-13 ENCOUNTER — Ambulatory Visit (INDEPENDENT_AMBULATORY_CARE_PROVIDER_SITE_OTHER): Payer: Medicare Other

## 2021-11-13 DIAGNOSIS — Z0000 Encounter for general adult medical examination without abnormal findings: Secondary | ICD-10-CM | POA: Diagnosis not present

## 2021-11-13 NOTE — Progress Notes (Signed)
Subjective:   Leslie Lyons is a 60 y.o. female who presents for Medicare Annual (Subsequent) preventive examination.  I connected with  DALY WHIPKEY on 11/13/21 by a audio enabled telemedicine application and verified that I am speaking with the correct person using two identifiers.  Patient Location: Home  Provider Location: Office/Clinic  I discussed the limitations of evaluation and management by telemedicine. The patient expressed understanding and agreed to proceed.   Review of Systems     Leslie Lyons , Thank you for taking time to come for your Medicare Wellness Visit. I appreciate your ongoing commitment to your health goals. Please review the following plan we discussed and let me know if I can assist you in the future.   These are the goals we discussed:  Goals      Patient Stated     Would like to get back to the old [place] she cant remember like she used to         This is a list of the screening recommended for you and due dates:  Health Maintenance  Topic Date Due   Eye exam for diabetics  Never done   Zoster (Shingles) Vaccine (1 of 2) Never done   Flu Shot  06/29/2021   Colon Cancer Screening  03/12/2022*   Tetanus Vaccine  03/12/2022*   COVID-19 Vaccine (2 - Booster for Janssen series) 03/12/2022*   Hemoglobin A1C  12/31/2021   Complete foot exam   07/06/2022   Pap Smear  10/29/2022   Mammogram  12/18/2022   Pneumococcal Vaccination (3 - PPSV23 if available, else PCV20) 01/18/2026   Hepatitis C Screening: USPSTF Recommendation to screen - Ages 73-79 yo.  Completed   HIV Screening  Completed   HPV Vaccine  Aged Out  *Topic was postponed. The date shown is not the original due date.          Objective:    There were no vitals filed for this visit. There is no height or weight on file to calculate BMI.  Advanced Directives 03/09/2021 11/12/2020 04/26/2020  Does Patient Have a Medical Advance Directive? No No No  Would patient like  information on creating a medical advance directive? No - Patient declined No - Patient declined No - Patient declined    Current Medications (verified) Outpatient Encounter Medications as of 11/13/2021  Medication Sig   albuterol (PROAIR HFA) 108 (90 Base) MCG/ACT inhaler Inhale 2 puffs into the lungs every 6 (six) hours as needed.   amLODipine (NORVASC) 10 MG tablet Take 1 tablet (10 mg total) by mouth daily.   atorvastatin (LIPITOR) 40 MG tablet Take 1 tablet (40 mg total) by mouth daily.   blood glucose meter kit and supplies Dispense based on patient and insurance preference. Test blood sugar once daily before breakfast. (FOR ICD-10 E10.9, E11.9).   Cholecalciferol (VITAMIN D3) 125 MCG (5000 UT) CAPS Take 1 capsule (5,000 Units total) by mouth daily.   ezetimibe (ZETIA) 10 MG tablet Take 1 tablet (10 mg total) by mouth daily.   glipiZIDE (GLUCOTROL XL) 10 MG 24 hr tablet Take 1 tablet by mouth once daily with breakfast   hydrOXYzine (VISTARIL) 50 MG capsule Take one capsule by mouth two times daily , as  Needed, for anxiety   ibuprofen (ADVIL) 600 MG tablet Take 600 mg by mouth 3 (three) times daily.   lisinopril (ZESTRIL) 10 MG tablet Take 1 tablet (10 mg total) by mouth daily.   mirabegron ER (MYRBETRIQ)  25 MG TB24 tablet Take 1 tablet (25 mg total) by mouth daily.   mometasone (NASONEX) 50 MCG/ACT nasal spray Place 2 sprays into the nose daily.   mometasone-formoterol (DULERA) 200-5 MCG/ACT AERO Inhale 2 puffs into the lungs 2 (two) times daily.   risperidone (RISPERDAL) 4 MG tablet TAKE 1 TABLET BY MOUTH AT BEDTIME   topiramate (TOPAMAX) 100 MG tablet Take 1 tablet (100 mg total) by mouth 2 (two) times daily.   traZODone (DESYREL) 150 MG tablet Take 1 tablet (150 mg total) by mouth at bedtime.   No facility-administered encounter medications on file as of 11/13/2021.    Allergies (verified) Janumet [sitagliptin-metformin hcl] and Robaxin [methocarbamol]   History: Past Medical  History:  Diagnosis Date   Abnormal transaminases    Anxiety    Bronchial asthma    with acute exacerbation   Cancer ([place]) 1980   cervical, diag  at age 21   Depression    Diabetes ([place])    Headache(784.0)    with reduced vision in right eye for one month    Hyperlipidemia    Hypertension    Obesity    Past Surgical History:  Procedure Laterality Date   ABDOMINAL HYSTERECTOMY     COLONOSCOPY WITH ESOPHAGOGASTRODUODENOSCOPY (EGD)  11/2008   Dr. Arnoldo Morale: Conscious sedation.  Hiatal hernia.  Hemorrhoids. PATIENT REPORTS BEING AWAKE DURING PROCEDURE   CRYOTHERAPY     for abnormal pap   HEMORRHOID SURGERY     dr. Irving Shows   PARTIAL HYSTERECTOMY     Family History  Problem Relation Age of Onset   Cancer Mother        lung    COPD Father        emphysema   Hypertension Father    Cancer Maternal Aunt        breast   Cancer Paternal Uncle    Colon cancer Neg Hx    Social History   Socioeconomic History   Marital status: Married    Spouse name: Not on file   Number of children: 2   Years of education: Not on file   Highest education level: Not on file  Occupational History   Occupation: homemaker   Tobacco Use   Smoking status: Former    Packs/day: 0.25    Years: 2.00    Pack years: 0.50    Types: Cigarettes    Quit date: 12/04/2015    Years since quitting: 5.9   Smokeless tobacco: Never  Vaping Use   Vaping Use: Never used  Substance and Sexual Activity   Alcohol use: No   Drug use: No   Sexual activity: Not Currently  Other Topics Concern   Not on file  Social History Narrative   Lives at home with husband    Social Determinants of Health   Financial Resource Strain: Not on file  Food Insecurity: Not on file  Transportation Needs: Not on file  Physical Activity: Not on file  Stress: Not on file  Social Connections: Not on file    Tobacco Counseling Counseling given: Not Answered   Clinical Intake:                  Diabetic? Nutrition Risk Assessment:  Has the patient had any N/V/D within the last 2 months?  No  Does the patient have any non-healing wounds?  No  Has the patient had any unintentional weight loss or weight gain?  No   Diabetes:  Is the patient  diabetic?  Yes  If diabetic, was a CBG obtained today?  No  Did the patient bring in their glucometer from home?  No  How often do you monitor your CBG's? daily.   Financial Strains and Diabetes Management:  Are you having any financial strains with the device, your supplies or your medication? No .  Does the patient want to be seen by Chronic Care Management for management of their diabetes?  No  Would the patient like to be referred to a Nutritionist or for Diabetic Management?  No   Diabetic Exams:  Diabetic Eye Exam: Overdue for diabetic eye exam. Pt has been advised about the importance in completing this exam. Patient advised to call and schedule an eye exam. Diabetic Foot Exam: Completed 07/06/21           Activities of Daily Living No flowsheet data found.  Patient Care Team: [name], MD as PCP - General (Family Medicine) Madelin Headings, DO (Optometry) Oneida Alar Marga Melnick, MD (Inactive) as Consulting Physician (Gastroenterology) Eloise Harman, DO as Consulting Physician (Internal Medicine)  Indicate any recent Medical Services you may have received from other than [facility] providers in the past year (date may be approximate).     Assessment:   This is a routine wellness examination for Leslie Lyons.  Hearing/Vision screen No results found.  Dietary issues and exercise activities discussed:     Goals Addressed   None   Depression Screen PHQ 2/9 Scores 03/12/2021 11/12/2020 11/12/2020 10/30/2020 08/05/2020 05/20/2020 07/11/2019  PHQ - 2 Score 0 3 4 0 0 3 5  PHQ- 9 Score - - - 2 - 14 19    Fall Risk Fall Risk  06/30/2021 03/12/2021 11/12/2020 08/05/2020 05/20/2020  Falls in the past year? 0 1 0 0 1  Number falls in  past yr: 0 1 0 - 1  Injury with Fall? 0 0 0 - 0  Risk Factor Category  - - - - -  Risk for fall due to : - Impaired balance/gait No Fall Risks - -  Follow up - Falls evaluation completed Falls evaluation completed - -    FALL RISK PREVENTION PERTAINING TO THE HOME:  Any stairs in or around the home? No  If so, are there any without handrails?    Home free of loose throw rugs in walkways, pet beds, electrical cords, etc? Yes  Adequate lighting in your home to reduce risk of falls? Yes   ASSISTIVE DEVICES UTILIZED TO PREVENT FALLS:  Life alert? No  Use of a cane, walker or w/c? Yes  Grab bars in the bathroom? No  Shower chair or bench in shower? Yes  Elevated toilet seat or a handicapped toilet? No     Cognitive Function: MMSE - Mini Mental State Exam 11/12/2020  Not completed: Unable to complete     6CIT Screen 11/12/2020 04/06/2019  What Year? 0 points 0 points  What month? 0 points 0 points  What time? 0 points 0 points  Count back from 20 0 points 0 points  Months in reverse 4 points 0 points  Repeat phrase 10 points 2 points  Total Score 14 2    Immunizations Immunization History  Administered Date(s) Administered   Influenza Whole 09/25/2007, 11/04/2009, 09/22/2010   Influenza,inj,Quad PF,6+ Mos 08/09/2013, 07/22/2014, 11/17/2015, 01/04/2018, 10/03/2018, 08/28/2019, 08/05/2020   Janssen (J&J) SARS-COV-2 Vaccination 05/20/2020   Pneumococcal Conjugate-13 04/25/2018   Pneumococcal Polysaccharide-23 07/31/2009, 07/05/2016   Td 03/26/2009    TDAP status: Due,  Education has been provided regarding the importance of this vaccine. Advised may receive this vaccine at local pharmacy or Health Dept. Aware to provide a copy of the vaccination record if obtained from local pharmacy or Health Dept. Verbalized acceptance and understanding.  Flu Vaccine status: Due, Education has been provided regarding the importance of this vaccine. Advised may receive this vaccine at local  pharmacy or Health Dept. Aware to provide a copy of the vaccination record if obtained from local pharmacy or Health Dept. Verbalized acceptance and understanding.  Pneumococcal vaccine status: Up to date  Covid-19 vaccine status: Completed vaccines  Qualifies for Shingles Vaccine? Yes   Zostavax completed No   Shingrix Completed?: No.    Education has been provided regarding the importance of this vaccine. Patient has been advised to call insurance company to determine out of pocket expense if they have not yet received this vaccine. Advised may also receive vaccine at local pharmacy or Health Dept. Verbalized acceptance and understanding.  Screening Tests Health Maintenance  Topic Date Due   OPHTHALMOLOGY EXAM  Never done   Zoster Vaccines- Shingrix (1 of 2) Never done   INFLUENZA VACCINE  06/29/2021   COLONOSCOPY (Pts 45-67yr Insurance coverage will need to be confirmed)  03/12/2022 (Originally 12/17/2018)   TETANUS/TDAP  03/12/2022 (Originally 03/27/2019)   COVID-19 Vaccine (2 - Booster for Janssen series) 03/12/2022 (Originally 07/15/2020)   HEMOGLOBIN A1C  12/31/2021   FOOT EXAM  07/06/2022   PAP SMEAR-Modifier  10/29/2022   MAMMOGRAM  12/18/2022   Pneumococcal Vaccine 176620Years old (3 - PPSV23 if available, else PCV20) 01/18/2026   Hepatitis C Screening  Completed   HIV Screening  Completed   HPV VACCINES  Aged Out    Health Maintenance  Health Maintenance Due  Topic Date Due   OPHTHALMOLOGY EXAM  Never done   Zoster Vaccines- Shingrix (1 of 2) Never done   INFLUENZA VACCINE  06/29/2021    Colorectal cancer screening: Referral to GI placed  . Pt aware the office will call re: appt.  Mammogram status: Completed 12/18/2020. Repeat every year  Bone Density status: Ordered 11/13/21. Pt provided with contact info and advised to call to schedule appt.  Lung Cancer Screening: (Low Dose CT Chest recommended if Age 60-80years, 30 pack-year currently smoking OR have quit  w/in 15years.) does not qualify.   Lung Cancer Screening Referral: no  Additional Screening:  Hepatitis C Screening: does qualify; Completed 04/25/2018  Vision Screening: Recommended annual ophthalmology exams for early detection of glaucoma and other disorders of the eye. Is the patient up to date with their annual eye exam?  No  Who is the provider or what is the name of the office in which the patient attends annual eye exams? Dr CJorja Loa If pt is not established with a provider, would they like to be referred to a provider to establish care? No .   Dental Screening: Recommended annual dental exams for proper oral hygiene  Community Resource Referral / Chronic Care Management: CRR required this visit?  No   CCM required this visit?  No      Plan:     I have personally reviewed and noted the following in the patients chart:   Medical and social history Use of alcohol, tobacco or illicit drugs  Current medications and supplements including opioid prescriptions.  Functional ability and status Nutritional status Physical activity Advanced directives List of other physicians Hospitalizations, surgeries, and ER visits in previous 12 months Vitals Screenings  to include cognitive, depression, and falls Referrals and appointments  In addition, I have reviewed and discussed with patient certain preventive protocols, quality metrics, and best practice recommendations. A written personalized care plan for preventive services as well as general preventive health recommendations were provided to patient.    Ms. Skluzacek , Thank you for taking time to come for your Medicare Wellness Visit. I appreciate your ongoing commitment to your health goals. Please review the following plan we discussed and let me know if I can assist you in the future.   These are the goals we discussed:  Goals      Patient Stated     Would like to get back to the old [place] she cant remember like she  used to      Patient Stated     Be more like her old self,more active .        This is a list of the screening recommended for you and due dates:  Health Maintenance  Topic Date Due   Eye exam for diabetics  Never done   Zoster (Shingles) Vaccine (1 of 2) Never done   Flu Shot  06/29/2021   Colon Cancer Screening  03/12/2022*   Tetanus Vaccine  03/12/2022*   COVID-19 Vaccine (2 - Booster for Janssen series) 03/12/2022*   Hemoglobin A1C  12/31/2021   Complete foot exam   07/06/2022   Pap Smear  10/29/2022   Mammogram  12/18/2022   Pneumococcal Vaccination (3 - PPSV23 if available, else PCV20) 01/18/2026   Hepatitis C Screening: USPSTF Recommendation to screen - Ages 52-79 yo.  Completed   HIV Screening  Completed   HPV Vaccine  Aged Out  *Topic was postponed. The date shown is not the original due date.      Quentin Angst, [place]   11/13/2021   Nurse Notes:

## 2021-11-17 ENCOUNTER — Other Ambulatory Visit: Payer: Self-pay | Admitting: Family Medicine

## 2021-11-24 ENCOUNTER — Telehealth: Payer: Self-pay | Admitting: Family Medicine

## 2021-11-24 ENCOUNTER — Other Ambulatory Visit: Payer: Self-pay

## 2021-11-24 MED ORDER — MIRABEGRON ER 25 MG PO TB24: 25.0000 mg | ORAL_TABLET | Freq: Every day | ORAL | 5 refills | Status: DC

## 2021-11-24 NOTE — Telephone Encounter (Signed)
Pt needs refill on BLADDER meds    [facility] , [place]

## 2021-12-04 ENCOUNTER — Other Ambulatory Visit: Payer: Self-pay | Admitting: *Deleted

## 2021-12-04 ENCOUNTER — Telehealth: Payer: Self-pay

## 2021-12-04 MED ORDER — AMLODIPINE BESYLATE 10 MG PO TABS: 10.0000 mg | ORAL_TABLET | Freq: Every day | ORAL | 4 refills | Status: DC

## 2021-12-04 NOTE — Telephone Encounter (Signed)
Medication sent to pharmacy  

## 2021-12-04 NOTE — Telephone Encounter (Signed)
Patient out of her high blood pressure medicine. Needs refill Amlodipine 10 mg  Pharamcy: [facility] [place]

## 2021-12-08 ENCOUNTER — Other Ambulatory Visit: Payer: Self-pay

## 2021-12-08 ENCOUNTER — Telehealth: Payer: Self-pay | Admitting: Family Medicine

## 2021-12-08 MED ORDER — ATORVASTATIN CALCIUM 40 MG PO TABS: 40.0000 mg | ORAL_TABLET | Freq: Every day | ORAL | 3 refills | Status: DC

## 2021-12-08 MED ORDER — GLIPIZIDE ER 10 MG PO TB24: 10.0000 mg | ORAL_TABLET | Freq: Every day | ORAL | 0 refills | Status: DC

## 2021-12-08 NOTE — Telephone Encounter (Signed)
Refills sent

## 2021-12-08 NOTE — Telephone Encounter (Signed)
Pt needs diabetic & Cholestorol meds called in

## 2021-12-09 ENCOUNTER — Ambulatory Visit: Payer: Medicare Other | Admitting: Family Medicine

## 2022-02-08 NOTE — Progress Notes (Signed)
This encounter was created in error - please disregard.

## 2022-03-09 ENCOUNTER — Other Ambulatory Visit: Payer: Self-pay

## 2022-03-09 MED ORDER — AMLODIPINE BESYLATE 10 MG PO TABS: 10.0000 mg | ORAL_TABLET | Freq: Every day | ORAL | 4 refills | Status: DC

## 2022-04-05 IMAGING — MG DIGITAL SCREENING BILAT W/ TOMO W/ CAD
8 series · 8 of 24 positions shown · non-contrast
Comparison: Previous exam(s).

CLINICAL DATA: Screening.

EXAM:
DIGITAL SCREENING BILATERAL MAMMOGRAM WITH TOMO AND CAD

[R CC synth-2D]
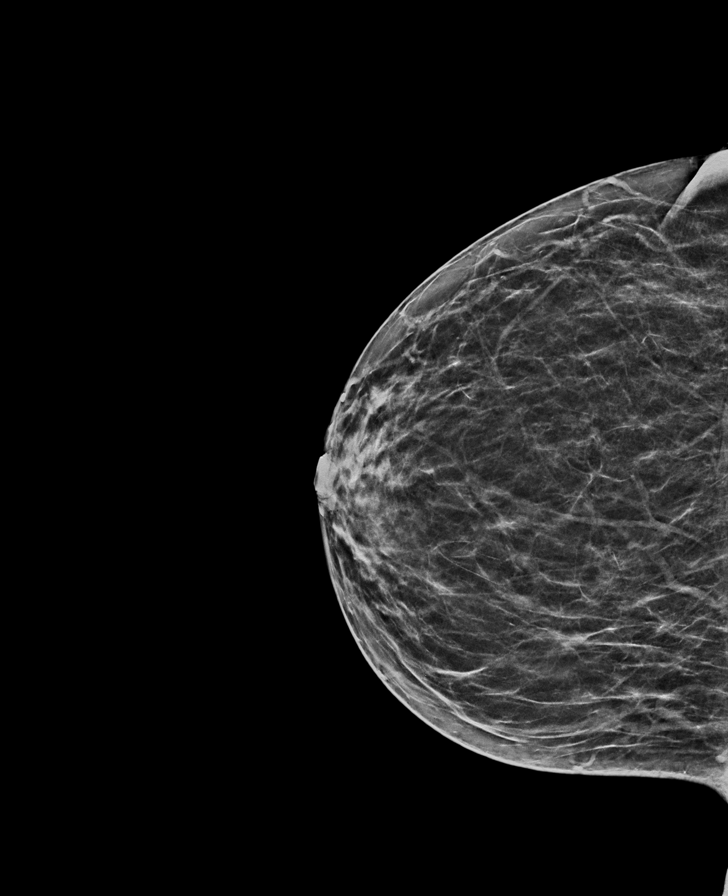

[L MLO synth-2D]
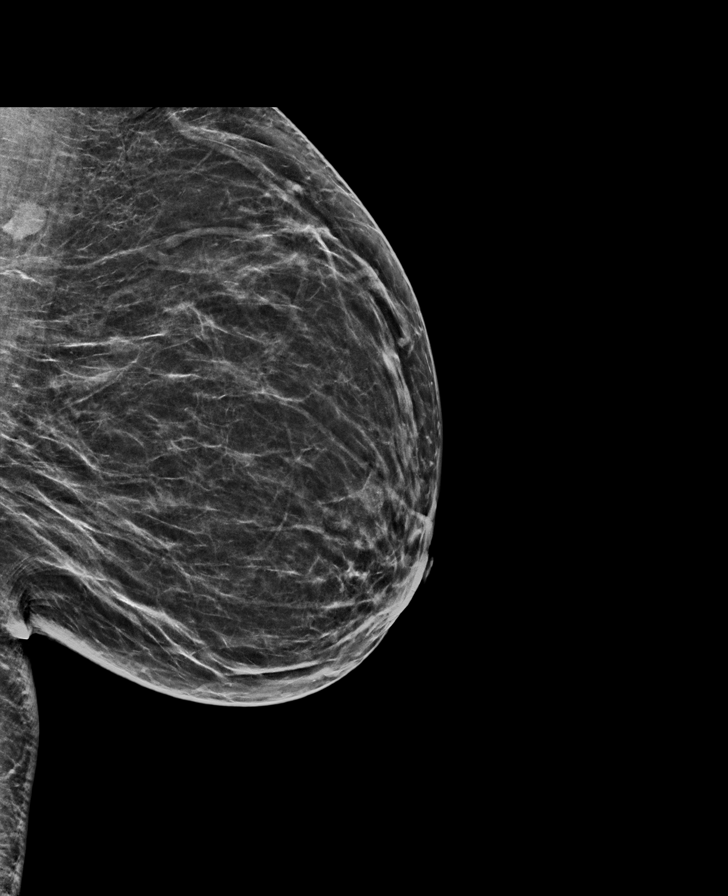

[L CC synth-2D]
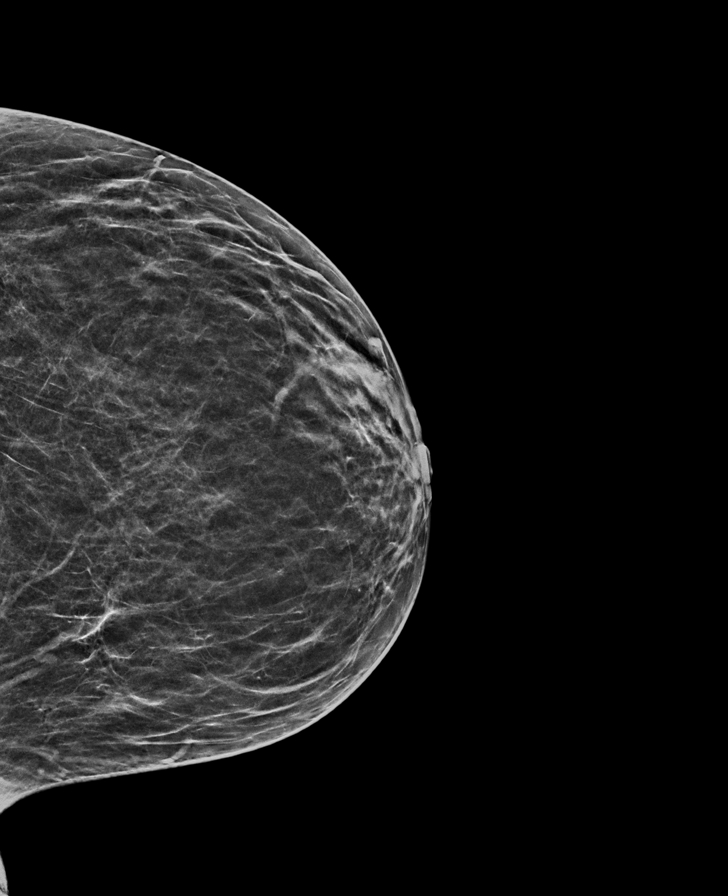

[R MLO synth-2D]
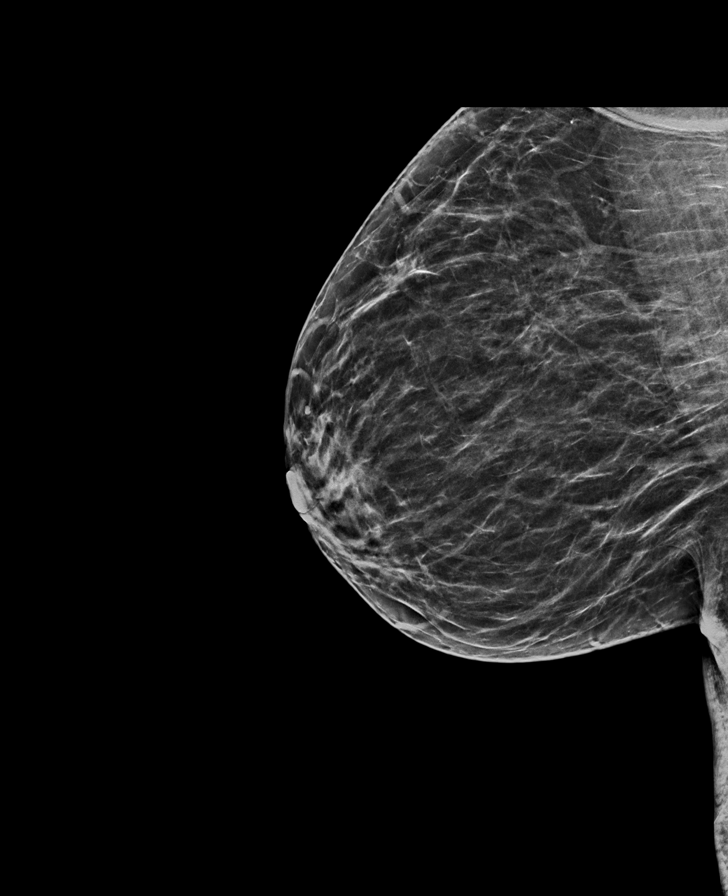

[L CC tomo · tomo slice 27/52.0]
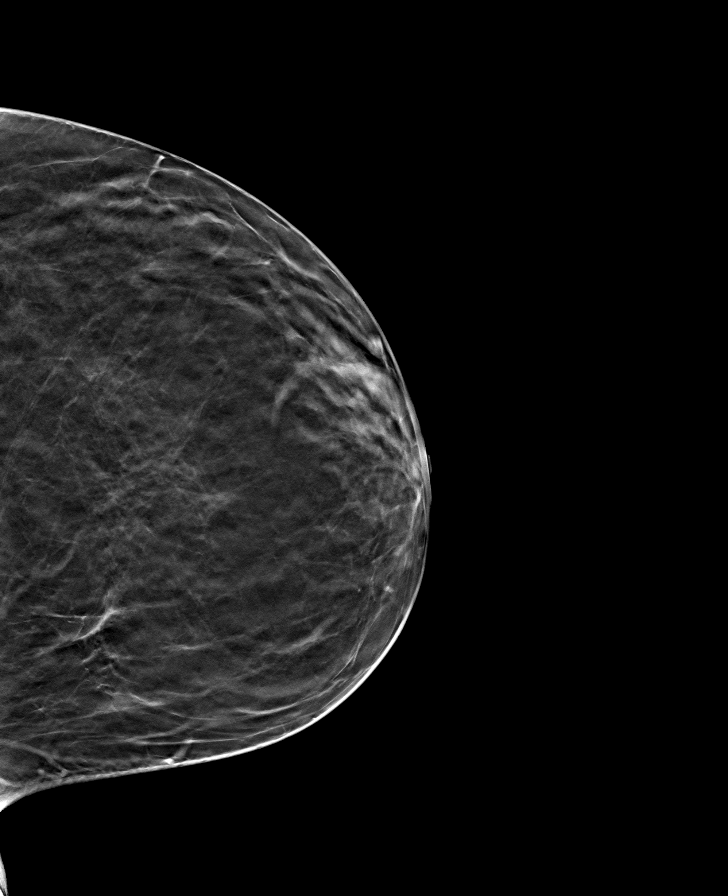

[R MLO tomo · tomo slice 29/58.0]
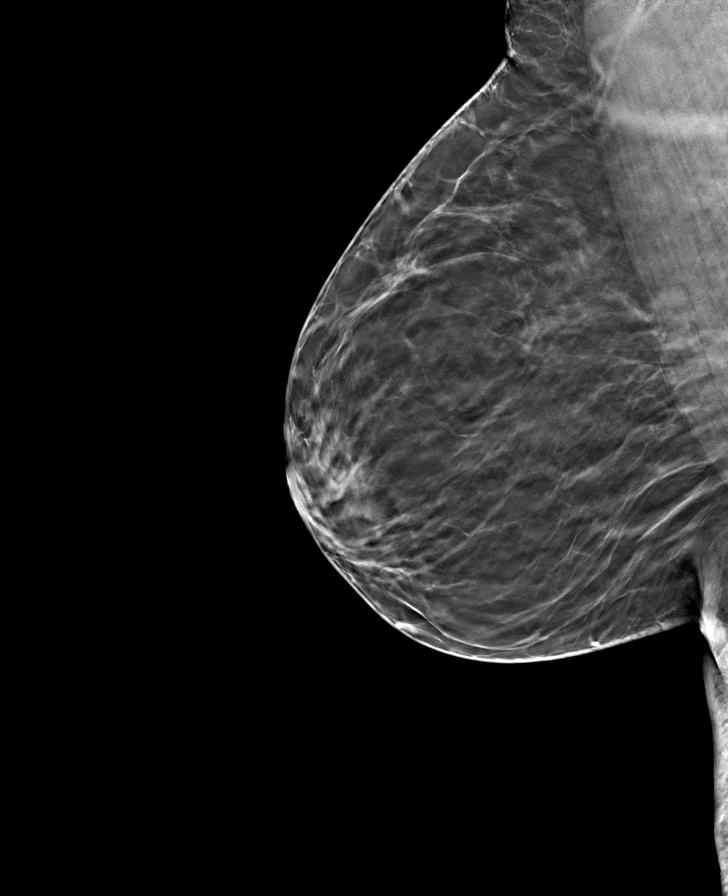

[R CC tomo · tomo slice 28/55.0]
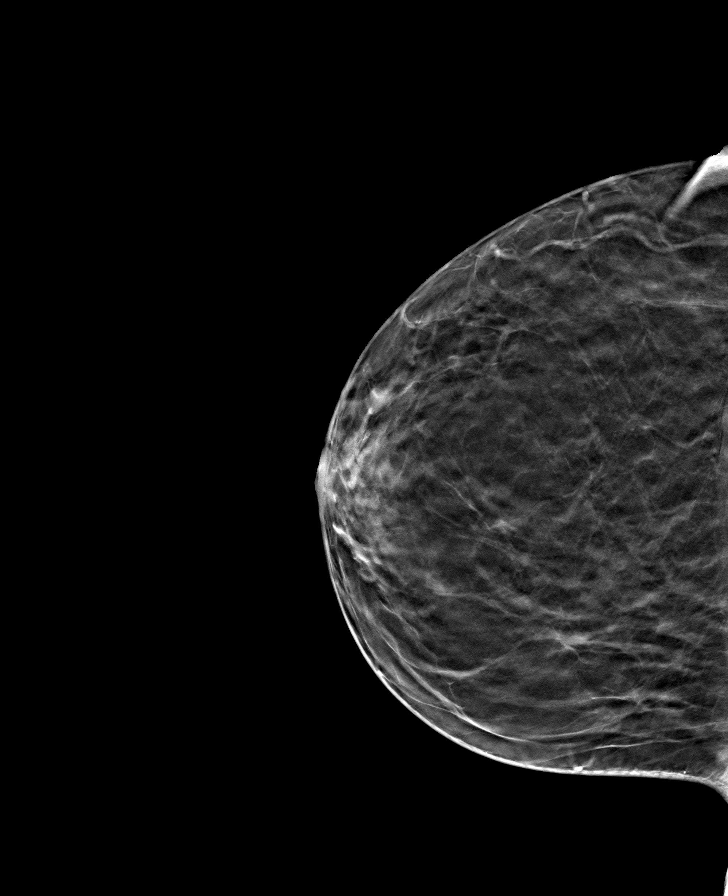

[L MLO tomo · tomo slice 31/60.0]
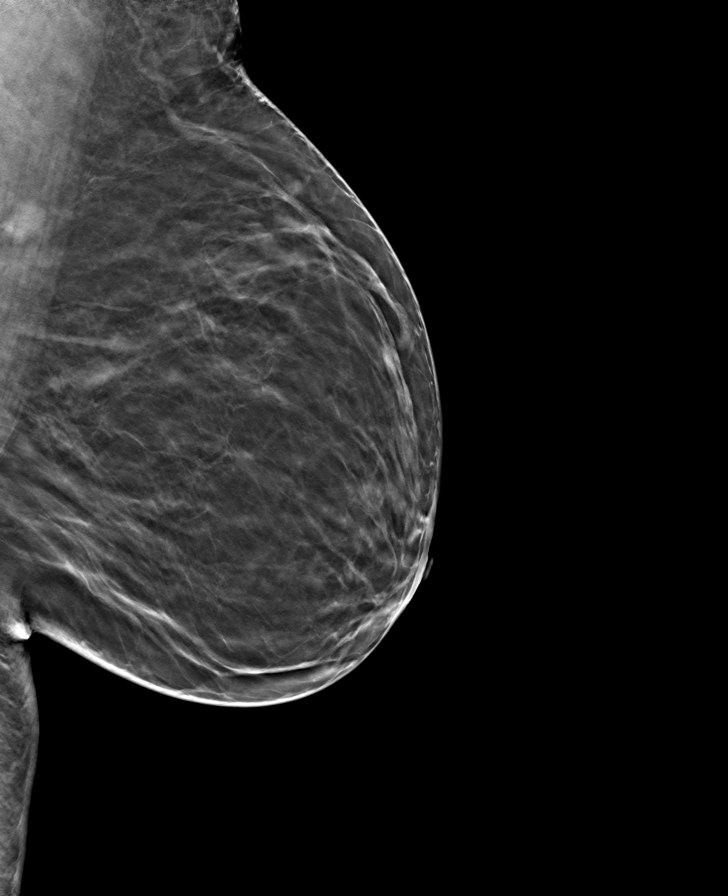

[8 of 24 positions shown; findings below may reference images not displayed]

ACR Breast Density Category b: There are scattered areas of
fibroglandular density.
FINDINGS: There are no findings suspicious for malignancy. Images were
processed with CAD.
IMPRESSION: No mammographic evidence of malignancy. A result letter of this
screening mammogram will be mailed directly to the patient.

RECOMMENDATION:
Screening mammogram in one year. (Code:[id number])

BI-RADS CATEGORY  1: Negative.

## 2022-04-06 ENCOUNTER — Other Ambulatory Visit: Payer: Self-pay

## 2022-04-06 MED ORDER — GLIPIZIDE ER 10 MG PO TB24: 10.0000 mg | ORAL_TABLET | Freq: Every day | ORAL | 0 refills | Status: DC

## 2022-04-15 ENCOUNTER — Ambulatory Visit: Payer: Medicare Other | Admitting: Family Medicine

## 2022-04-22 ENCOUNTER — Encounter: Payer: Self-pay | Admitting: Family Medicine

## 2022-04-22 ENCOUNTER — Other Ambulatory Visit: Payer: Self-pay

## 2022-04-22 ENCOUNTER — Ambulatory Visit (INDEPENDENT_AMBULATORY_CARE_PROVIDER_SITE_OTHER): Payer: Medicare Other | Admitting: Family Medicine

## 2022-04-22 VITALS — BP 159/90 | HR 96 | Ht 63.0 in | Wt 238.0 lb

## 2022-04-22 DIAGNOSIS — E1122 Type 2 diabetes mellitus with diabetic chronic kidney disease: Secondary | ICD-10-CM

## 2022-04-22 DIAGNOSIS — Z794 Long term (current) use of insulin: Secondary | ICD-10-CM | POA: Diagnosis not present

## 2022-04-22 DIAGNOSIS — E1165 Type 2 diabetes mellitus with hyperglycemia: Secondary | ICD-10-CM

## 2022-04-22 DIAGNOSIS — N182 Chronic kidney disease, stage 2 (mild): Secondary | ICD-10-CM | POA: Diagnosis not present

## 2022-04-22 DIAGNOSIS — R32 Unspecified urinary incontinence: Secondary | ICD-10-CM | POA: Diagnosis not present

## 2022-04-22 DIAGNOSIS — R7401 Elevation of levels of liver transaminase levels: Secondary | ICD-10-CM | POA: Diagnosis not present

## 2022-04-22 DIAGNOSIS — I10 Essential (primary) hypertension: Secondary | ICD-10-CM | POA: Diagnosis not present

## 2022-04-22 DIAGNOSIS — K7581 Nonalcoholic steatohepatitis (NASH): Secondary | ICD-10-CM | POA: Diagnosis not present

## 2022-04-22 DIAGNOSIS — Z1231 Encounter for screening mammogram for malignant neoplasm of breast: Secondary | ICD-10-CM | POA: Diagnosis not present

## 2022-04-22 DIAGNOSIS — F4323 Adjustment disorder with mixed anxiety and depressed mood: Secondary | ICD-10-CM | POA: Diagnosis not present

## 2022-04-22 DIAGNOSIS — E782 Mixed hyperlipidemia: Secondary | ICD-10-CM

## 2022-04-22 DIAGNOSIS — Z1211 Encounter for screening for malignant neoplasm of colon: Secondary | ICD-10-CM

## 2022-04-22 DIAGNOSIS — E559 Vitamin D deficiency, unspecified: Secondary | ICD-10-CM

## 2022-04-22 LAB — POCT GLYCOSYLATED HEMOGLOBIN (HGB A1C): HbA1c, POC (controlled diabetic range): 12.4 % — AB (ref 0.0–7.0)

## 2022-04-22 LAB — GLUCOSE, POCT (MANUAL RESULT ENTRY): POC Glucose: 335 mg/dl — AB (ref 70–99)

## 2022-04-22 MED ORDER — DULERA 200-5 MCG/ACT IN AERO: 2.0000 | INHALATION_SPRAY | Freq: Two times a day (BID) | RESPIRATORY_TRACT | 2 refills | Status: DC

## 2022-04-22 MED ORDER — LOSARTAN POTASSIUM 50 MG PO TABS: 50.0000 mg | ORAL_TABLET | Freq: Every day | ORAL | 2 refills | Status: DC

## 2022-04-22 MED ORDER — EZETIMIBE 10 MG PO TABS: 10.0000 mg | ORAL_TABLET | Freq: Every day | ORAL | 1 refills | Status: DC

## 2022-04-22 MED ORDER — INSULIN ASPART 100 UNIT/ML IJ SOLN
5.0000 [IU] | Freq: Once | INTRAMUSCULAR | Status: AC
  Administered 2022-04-22: 5 [IU] via SUBCUTANEOUS

## 2022-04-22 MED ORDER — AMLODIPINE BESYLATE 10 MG PO TABS: 10.0000 mg | ORAL_TABLET | Freq: Every day | ORAL | 4 refills | Status: DC

## 2022-04-22 MED ORDER — ALBUTEROL SULFATE HFA 108 (90 BASE) MCG/ACT IN AERS: 2.0000 | INHALATION_SPRAY | Freq: Four times a day (QID) | RESPIRATORY_TRACT | 3 refills | Status: DC | PRN

## 2022-04-22 MED ORDER — MIRABEGRON ER 25 MG PO TB24: 25.0000 mg | ORAL_TABLET | Freq: Every day | ORAL | 5 refills | Status: DC

## 2022-04-22 MED ORDER — MOMETASONE FUROATE 50 MCG/ACT NA SUSP: 2.0000 | Freq: Every day | NASAL | 3 refills | Status: DC

## 2022-04-22 NOTE — Patient Instructions (Addendum)
F/u in 3 weeks, re evaluate blood pressure, call if you need me sooner  Nurse pls refill albuterol, nasonex and dulera  New medication for blood pressure is cozaar 50 mg daily  Nurse please cancel refill on zetia and atorvastatin, I am waiting on lab from today  Please enter name of sister here with her and contact info as DPR  Microalb, cBC, lipid, tsh , cmp and eGFr, Vit D today  You are referred for colonoscopy, to Urology and to Endocrinology , important that you keep all appointments  You cannot be cleeared for dental work until blood pressure is controlled  Please get TdAP at your pharmacy  Please schedule mammogram at checkout  Thanks for choosing [facility], we consider it a privelige to serve you.

## 2022-04-22 NOTE — Assessment & Plan Note (Addendum)
Uncontrolled, 5 units regular inslin in office and refer to Endo glycoHb is 12.9  Leslie Lyons is reminded of the importance of commitment to daily physical activity for 30 minutes or more, as able and the need to limit carbohydrate intake to 30 to 60 grams per meal to help with blood sugar control.   The need to take medication as prescribed, test blood sugar as directed, and to call between visits if there is a concern that blood sugar is uncontrolled is also discussed.   Leslie Lyons is reminded of the importance of daily foot exam, annual eye examination, and good blood sugar, blood pressure and cholesterol control.     Latest Ref Rng & Units 06/30/2021   10:33 AM 03/09/2021    9:44 AM 10/30/2020   10:42 AM 08/05/2020   11:07 AM 08/05/2020   10:12 AM  Diabetic Labs  HbA1c 4.8 - 5.6 % 6.9    6.1    6.0    Microalbumin Not Estab. ug/mL    22.2     Chol 100 - 199 mg/dL 219     147     HDL >39 mg/dL 58     51     Calc LDL 0 - 99 mg/dL 113     71     Triglycerides 0 - 149 mg/dL 282     126     Creatinine 0.57 - 1.00 mg/dL 0.92   0.85    0.87         04/22/2022   10:04 AM 04/22/2022    9:21 AM 06/30/2021    9:44 AM 03/12/2021    9:20 AM 03/12/2021    8:58 AM 03/09/2021    2:30 PM 03/09/2021    1:30 PM  BP/Weight  Systolic BP 778 242 353 614 431 540 086  Diastolic BP 90 88 68 92 83 80 89  Wt. (Lbs)  238 210  211    BMI  42.16 kg/m2 37.2 kg/m2  37.38 kg/m2        08/05/2020    9:20 AM 08/28/2019    8:40 AM  Foot/eye exam completion dates  Foot Form Completion Done Done   Increase glipizide to 2 daily till seen

## 2022-04-22 NOTE — Progress Notes (Signed)
Leslie Lyons     MRN: [id number]      DOB: February 06, 1961   HPI Leslie Lyons is here for follow up and re-evaluation of chronic medical conditions, medication management and review of any available recent lab and radiology data.  C/o urinary incontinence, worsening, wants urology re evaluation C/o stringy stool for over 1 year, BM on avg twice per week, sometimes blood visible, strain  and push and stool is green and brown Neuropathy , nuimbness and tingling in feet was at podiatry in [place] this year states no improvement Needs medical clearance for dental work her BP was high and she was required by [facility] to see medical provider ROS Denies recent fever or chills. Denies sinus pressure, nasal congestion, ear pain or sore throat. Denies chest congestion, productive cough or wheezing. Denies chest pains, palpitations and leg swelling . Denies skin break down or rash.   PE  BP (!) 159/88   Pulse (!) 112   Ht 5' 3"  (1.6 m)   Wt 238 lb (108 kg)   SpO2 92%   BMI 42.16 kg/m   Patient alert and oriented and in no cardiopulmonary distress.  HEENT: No facial asymmetry, EOMI,     Neck supple .  Chest: Clear to auscultation bilaterally.  CVS: S1, S2 no murmurs, no S3.Regular rate.  ABD: Soft non tender.   Ext: No edema  MS: Decreased  ROM spine, shoulders, hips and knees.  Skin: Intact, no ulcerations or rash noted.  Psych: Good eye contact, normal affect. anxious and  depressed appearing.  CNS: CN 2-12 intact, power,  normal throughout.no focal deficits noted.   Assessment & Plan  Type 2 diabetes mellitus with hyperglycemia (HCC) Uncontrolled, 5 units regular inslin in office and refer to Endo glycoHb is 12.9  Leslie Lyons is reminded of the importance of commitment to daily physical activity for 30 minutes or more, as able and the need to limit carbohydrate intake to 30 to 60 grams per meal to help with blood sugar control.   The need to take medication as prescribed,  test blood sugar as directed, and to call between visits if there is a concern that blood sugar is uncontrolled is also discussed.   Leslie Lyons is reminded of the importance of daily foot exam, annual eye examination, and good blood sugar, blood pressure and cholesterol control.     Latest Ref Rng & Units 06/30/2021   10:33 AM 03/09/2021    9:44 AM 10/30/2020   10:42 AM 08/05/2020   11:07 AM 08/05/2020   10:12 AM  Diabetic Labs  HbA1c 4.8 - 5.6 % 6.9    6.1    6.0    Microalbumin Not Estab. ug/mL    22.2     Chol 100 - 199 mg/dL 219     147     HDL >39 mg/dL 58     51     Calc LDL 0 - 99 mg/dL 113     71     Triglycerides 0 - 149 mg/dL 282     126     Creatinine 0.57 - 1.00 mg/dL 0.92   0.85    0.87         04/22/2022   10:04 AM 04/22/2022    9:21 AM 06/30/2021    9:44 AM 03/12/2021    9:20 AM 03/12/2021    8:58 AM 03/09/2021    2:30 PM 03/09/2021    1:30 PM  BP/Weight  Systolic BP  159 159 102 150 591 368 599  Diastolic BP 90 88 68 92 83 80 89  Wt. (Lbs)  238 210  211    BMI  42.16 kg/m2 37.2 kg/m2  37.38 kg/m2        08/05/2020    9:20 AM 08/28/2019    8:40 AM  Foot/eye exam completion dates  Foot Form Completion Done Done   Increase glipizide to 2 daily till seen     Essential hypertension, benign Uncontrolled , needs to resume meds and will reassess in 3 weeks DASH diet and commitment to daily physical activity for a minimum of 30 minutes discussed and encouraged, as a part of hypertension management. The importance of attaining a healthy weight is also discussed.     04/23/2022    9:20 AM 04/22/2022   10:04 AM 04/22/2022    9:21 AM 06/30/2021    9:44 AM 03/12/2021    9:20 AM 03/12/2021    8:58 AM 03/09/2021    2:30 PM  BP/Weight  Systolic BP 234 144 360 165 800 634 949  Diastolic BP 72 90 88 68 92 83 80  Wt. (Lbs) 236.8  238 210  211   BMI 41.95 kg/m2  42.16 kg/m2 37.2 kg/m2  37.38 kg/m2        Urinary incontinence Inadequate response to medication refer  Urology  Morbid obesity [facility])  Patient re-educated about  the importance of commitment to a  minimum of 150 minutes of exercise per week as able.  The importance of healthy food choices with portion control discussed, as well as eating regularly and within a 12 hour window most days. The need to choose "clean , green" food 50 to 75% of the time is discussed, as well as to make water the primary drink and set a goal of 64 ounces water daily.       04/23/2022    9:20 AM 04/22/2022    9:21 AM 06/30/2021    9:44 AM  Weight /BMI  Weight 236 lb 12.8 oz 238 lb 210 lb  Height 5' 3"  (1.6 m) 5' 3"  (1.6 m) 5' 3"  (1.6 m)  BMI 41.95 kg/m2 42.16 kg/m2 37.2 kg/m2      Mixed hyperlipidemia Hyperlipidemia:Low fat diet discussed and encouraged.   Lipid Panel  Lab Results  Component Value Date   CHOL 150 04/22/2022   HDL 55 04/22/2022   LDLCALC 72 04/22/2022   TRIG 129 04/22/2022   CHOLHDL 2.7 04/22/2022       Transaminitis Need Hepatitis panel

## 2022-04-23 ENCOUNTER — Other Ambulatory Visit: Payer: Self-pay

## 2022-04-23 ENCOUNTER — Ambulatory Visit (INDEPENDENT_AMBULATORY_CARE_PROVIDER_SITE_OTHER): Payer: Medicare Other | Admitting: Nurse Practitioner

## 2022-04-23 ENCOUNTER — Encounter: Payer: Self-pay | Admitting: Nurse Practitioner

## 2022-04-23 VITALS — BP 116/72 | HR 88 | Ht 63.0 in | Wt 236.8 lb

## 2022-04-23 DIAGNOSIS — E782 Mixed hyperlipidemia: Secondary | ICD-10-CM | POA: Diagnosis not present

## 2022-04-23 DIAGNOSIS — I10 Essential (primary) hypertension: Secondary | ICD-10-CM | POA: Diagnosis not present

## 2022-04-23 DIAGNOSIS — R748 Abnormal levels of other serum enzymes: Secondary | ICD-10-CM

## 2022-04-23 DIAGNOSIS — E1165 Type 2 diabetes mellitus with hyperglycemia: Secondary | ICD-10-CM

## 2022-04-23 LAB — LIPID PANEL
Chol/HDL Ratio: 2.7 ratio (ref 0.0–4.4)
Cholesterol, Total: 150 mg/dL (ref 100–199)
HDL: 55 mg/dL (ref >39)
LDL Chol Calc (NIH): 72 mg/dL (ref 0–99)
Triglycerides: 129 mg/dL (ref 0–149)
VLDL Cholesterol Cal: 23 mg/dL (ref 5–40)

## 2022-04-23 LAB — CBC
Hematocrit: 40.9 % (ref 34.0–46.6)
Hemoglobin: 13.6 g/dL (ref 11.1–15.9)
MCH: 27.9 pg (ref 26.6–33.0)
MCHC: 33.3 g/dL (ref 31.5–35.7)
MCV: 84 fL (ref 79–97)
Platelets: 193 10*3/uL (ref 150–450)
RBC: 4.87 x10E6/uL (ref 3.77–5.28)
RDW: 12.5 % (ref 11.7–15.4)
WBC: 10.1 10*3/uL (ref 3.4–10.8)

## 2022-04-23 LAB — CMP14+EGFR
ALT: 55 IU/L — ABNORMAL HIGH (ref 0–32)
AST: 87 IU/L — ABNORMAL HIGH (ref 0–40)
Albumin/Globulin Ratio: 1.4 (ref 1.2–2.2)
Albumin: 4.8 g/dL (ref 3.8–4.8)
Alkaline Phosphatase: 115 IU/L (ref 44–121)
BUN/Creatinine Ratio: 9 — ABNORMAL LOW (ref 12–28)
BUN: 9 mg/dL (ref 8–27)
Bilirubin Total: 0.3 mg/dL (ref 0.0–1.2)
CO2: 24 mmol/L (ref 20–29)
Calcium: 10.1 mg/dL (ref 8.7–10.3)
Chloride: 93 mmol/L — ABNORMAL LOW (ref 96–106)
Creatinine, Ser: 0.96 mg/dL (ref 0.57–1.00)
Globulin, Total: 3.5 g/dL (ref 1.5–4.5)
Glucose: 328 mg/dL — ABNORMAL HIGH (ref 70–99)
Potassium: 3.7 mmol/L (ref 3.5–5.2)
Sodium: 135 mmol/L (ref 134–144)
Total Protein: 8.3 g/dL (ref 6.0–8.5)
eGFR: 67 mL/min/{1.73_m2} (ref >59)

## 2022-04-23 LAB — TSH: TSH: 1.78 u[IU]/mL (ref 0.450–4.500)

## 2022-04-23 LAB — VITAMIN D 25 HYDROXY (VIT D DEFICIENCY, FRACTURES): Vit D, 25-Hydroxy: 28 ng/mL — ABNORMAL LOW (ref 30.0–100.0)

## 2022-04-23 MED ORDER — ONETOUCH ULTRASOFT LANCETS MISC: 12 refills | Status: DC

## 2022-04-23 MED ORDER — GLIPIZIDE ER 5 MG PO TB24: 5.0000 mg | ORAL_TABLET | Freq: Every day | ORAL | 3 refills | Status: DC

## 2022-04-23 MED ORDER — ONETOUCH VERIO FLEX SYSTEM W/DEVICE KIT: PACK | 0 refills | Status: DC

## 2022-04-23 MED ORDER — ONETOUCH VERIO VI STRP: ORAL_STRIP | 12 refills | Status: DC

## 2022-04-23 NOTE — Patient Instructions (Signed)

## 2022-04-23 NOTE — Progress Notes (Signed)
Endocrinology Consult Note       04/23/2022, 11:31 AM   Subjective:    Patient ID: Leslie Lyons, female    DOB: 1961-02-04.  Leslie Lyons is being seen in consultation for management of currently uncontrolled symptomatic diabetes requested by  [name], MD.   Past Medical History:  Diagnosis Date   Abnormal transaminases    Anxiety    Bronchial asthma    with acute exacerbation   Cancer ([place]) 1980   cervical, diag  at age 61   Depression    Diabetes ([place])    Diabetes mellitus, type II ([place])    Headache(784.0)    with reduced vision in right eye for one month    Hyperlipidemia    Hypertension    Obesity     Past Surgical History:  Procedure Laterality Date   ABDOMINAL HYSTERECTOMY     COLONOSCOPY WITH ESOPHAGOGASTRODUODENOSCOPY (EGD)  11/2008   Dr. Arnoldo Morale: Conscious sedation.  Hiatal hernia.  Hemorrhoids. PATIENT REPORTS BEING AWAKE DURING PROCEDURE   CRYOTHERAPY     for abnormal pap   HEMORRHOID SURGERY     dr. Irving Shows   PARTIAL HYSTERECTOMY      Social History   Socioeconomic History   Marital status: Married    Spouse name: Not on file   Number of children: 2   Years of education: Not on file   Highest education level: Not on file  Occupational History   Occupation: homemaker   Tobacco Use   Smoking status: Former    Packs/day: 0.25    Years: 2.00    Pack years: 0.50    Types: Cigarettes    Quit date: 12/04/2015    Years since quitting: 6.3   Smokeless tobacco: Never  Vaping Use   Vaping Use: Never used  Substance and Sexual Activity   Alcohol use: No   Drug use: No   Sexual activity: Not Currently  Other Topics Concern   Not on file  Social History Narrative   Lives at home with husband    Social Determinants of Health   Financial Resource Strain: Low Risk    Difficulty of Paying Living Expenses: Not hard at all  Food Insecurity: No Food  Insecurity   Worried About Charity fundraiser in the Last Year: Never true   [place] in the Last Year: Never true  Transportation Needs: No Transportation Needs   Lack of Transportation (Medical): No   Lack of Transportation (Non-Medical): No  Physical Activity: Inactive   Days of Exercise per Week: 0 days   Minutes of Exercise per Session: 0 min  Stress: No Stress Concern Present   Feeling of Stress : Not at all  Social Connections: Moderately Isolated   Frequency of Communication with Friends and Family: Twice a week   Frequency of Social Gatherings with Friends and Family: Twice a week   Attends Religious Services: Never   Marine scientist or Organizations: No   Attends Archivist Meetings: Never   Marital Status: Married    Family History  Problem Relation Age of Onset   Cancer Mother  lung    COPD Father        emphysema   Hypertension Father    Cancer Maternal Aunt        breast   Cancer Paternal Uncle    Colon cancer Neg Hx     Outpatient Encounter Medications as of 04/23/2022  Medication Sig   Blood Glucose Monitoring Suppl (ONETOUCH VERIO FLEX SYSTEM) w/Device KIT Use to monitor glucose twice daily   glipiZIDE (GLUCOTROL XL) 5 MG 24 hr tablet Take 1 tablet (5 mg total) by mouth daily with breakfast.   glucose blood (ONETOUCH VERIO) test strip Use as instructed to measure glucose twice daily   Lancets (ONETOUCH ULTRASOFT) lancets Use as instructed to monitor glucose twice daily   albuterol (PROAIR HFA) 108 (90 Base) MCG/ACT inhaler Inhale 2 puffs into the lungs every 6 (six) hours as needed.   blood glucose meter kit and supplies Dispense based on patient and insurance preference. Test blood sugar once daily before breakfast. (FOR ICD-10 E10.9, E11.9). (Patient not taking: Reported on 04/22/2022)   ibuprofen (ADVIL) 600 MG tablet Take 600 mg by mouth 3 (three) times daily.   losartan (COZAAR) 50 MG tablet Take 1 tablet (50 mg total) by  mouth daily.   mirabegron ER (MYRBETRIQ) 25 MG TB24 tablet Take 1 tablet (25 mg total) by mouth daily.   mometasone (NASONEX) 50 MCG/ACT nasal spray Place 2 sprays into the nose daily.   mometasone-formoterol (DULERA) 200-5 MCG/ACT AERO Inhale 2 puffs into the lungs 2 (two) times daily.   [DISCONTINUED] glipiZIDE (GLUCOTROL XL) 10 MG 24 hr tablet Take 1 tablet (10 mg total) by mouth daily with breakfast.   [DISCONTINUED] hydrOXYzine (VISTARIL) 50 MG capsule Take one capsule by mouth two times daily , as  Needed, for anxiety (Patient not taking: Reported on 04/22/2022)   No facility-administered encounter medications on file as of 04/23/2022.    ALLERGIES: Allergies  Allergen Reactions   Janumet [Sitagliptin-Metformin Hcl]     States she vomits up the whole pill after taking this   Robaxin [Methocarbamol] Swelling    VACCINATION STATUS: Immunization History  Administered Date(s) Administered   Influenza Whole 09/25/2007, 11/04/2009, 09/22/2010   Influenza,inj,Quad PF,6+ Mos 08/09/2013, 07/22/2014, 11/17/2015, 01/04/2018, 10/03/2018, 08/28/2019, 08/05/2020   Janssen (J&J) SARS-COV-2 Vaccination 05/20/2020   Pneumococcal Conjugate-13 04/25/2018   Pneumococcal Polysaccharide-23 07/31/2009, 07/05/2016   Td 03/26/2009    Diabetes She presents for her initial diabetic visit. She has type 2 diabetes mellitus. Onset time: Diagnosed at approx age of 34. Her disease course has been worsening. There are no hypoglycemic associated symptoms. Associated symptoms include blurred vision, fatigue, foot paresthesias, polydipsia and polyuria. There are no hypoglycemic complications. Symptoms are worsening. Diabetic complications include peripheral neuropathy. Risk factors for coronary artery disease include diabetes mellitus, dyslipidemia, family history, obesity, hypertension, sedentary lifestyle and post-menopausal. Current diabetic treatment includes oral agent (monotherapy). She is compliant with  treatment most of the time. Her weight is fluctuating minimally. She is following a generally unhealthy diet. When asked about meal planning, she reported none. She has had a previous visit with a dietitian (went to DM class previously). (She presents today for her consultation, accompanied by her sister, with no meter or logs to review.  Her most recent A1c on 5/25 was 12.4%.  She does not have a glucose meter at home.  She drinks ginger ale, soda, and vitamin water and does not eat on any sort of routine (sister says she will sometimes go 3 days  without eating).  She does not engage in routine physical activity.  She is due for eye exam, has seen podiatrist in the past.) An ACE inhibitor/angiotensin II receptor blocker is being taken. She sees a podiatrist.Eye exam is not current.    Review of systems  Constitutional: + Minimally fluctuating body weight, current Body mass index is 41.95 kg/m., + fatigue, no subjective hyperthermia, no subjective hypothermia Eyes: + blurry vision, no xerophthalmia ENT: no sore throat, no nodules palpated in throat, no dysphagia/odynophagia, no hoarseness Cardiovascular: no chest pain, no shortness of breath, no palpitations, no leg swelling Respiratory: no cough, no shortness of breath Gastrointestinal: no nausea/vomiting/diarrhea Musculoskeletal: no muscle/joint aches Skin: no rashes, no hyperemia Neurological: no tremors, no numbness, no tingling, no dizziness Psychiatric: no depression, no anxiety  Objective:     BP 116/72   Pulse 88   Ht 5' 3" (1.6 m)   Wt 236 lb 12.8 oz (107.4 kg)   BMI 41.95 kg/m   Wt Readings from Last 3 Encounters:  04/23/22 236 lb 12.8 oz (107.4 kg)  04/22/22 238 lb (108 kg)  06/30/21 210 lb (95.3 kg)     BP Readings from Last 3 Encounters:  04/23/22 116/72  04/22/22 (!) 159/90  06/30/21 102/68     Physical Exam- Limited  Constitutional:  Body mass index is 41.95 kg/m. , not in acute distress, normal state of  mind Eyes:  EOMI, no exophthalmos Neck: Supple Cardiovascular: RRR, no murmurs, rubs, or gallops, no edema Respiratory: Adequate breathing efforts, no crackles, rales, rhonchi, or wheezing Musculoskeletal: no gross deformities, strength intact in all four extremities, no gross restriction of joint movements Skin:  no rashes, no hyperemia Neurological: no tremor with outstretched hands    CMP ( most recent) CMP     Component Value Date/Time   NA 135 04/22/2022 1044   K 3.7 04/22/2022 1044   CL 93 (L) 04/22/2022 1044   CO2 24 04/22/2022 1044   GLUCOSE 328 (H) 04/22/2022 1044   GLUCOSE 144 (H) 03/09/2021 0944   BUN 9 04/22/2022 1044   CREATININE 0.96 04/22/2022 1044   CREATININE 1.56 (H) 01/07/2020 0913   CALCIUM 10.1 04/22/2022 1044   PROT 8.3 04/22/2022 1044   ALBUMIN 4.8 04/22/2022 1044   AST 87 (H) 04/22/2022 1044   ALT 55 (H) 04/22/2022 1044   ALKPHOS 115 04/22/2022 1044   BILITOT 0.3 04/22/2022 1044   GFRNONAA >60 03/09/2021 0944   GFRNONAA 69 07/13/2019 0816   GFRAA >60 08/05/2020 1107   GFRAA 80 07/13/2019 0816     Diabetic Labs (most recent): Lab Results  Component Value Date   HGBA1C 12.4 (A) 04/22/2022   HGBA1C 6.9 (H) 06/30/2021   HGBA1C 6.1 10/30/2020     Lipid Panel ( most recent) Lipid Panel     Component Value Date/Time   CHOL 150 04/22/2022 1044   TRIG 129 04/22/2022 1044   HDL 55 04/22/2022 1044   CHOLHDL 2.7 04/22/2022 1044   CHOLHDL 2.9 08/05/2020 1107   VLDL 25 08/05/2020 1107   LDLCALC 72 04/22/2022 1044   LDLCALC 169 (H) 07/13/2019 0816   LABVLDL 23 04/22/2022 1044      Lab Results  Component Value Date   TSH 1.780 04/22/2022   TSH 2.630 06/30/2021   TSH 1.426 08/05/2020   TSH 0.869 04/22/2020   TSH 3.56 03/27/2019   TSH 1.80 01/04/2018   TSH 2.75 05/12/2016   TSH 1.915 07/05/2013   TSH 0.829 06/25/2011   TSH 1.159  09/28/2010           Assessment & Plan:   1) Type 2 diabetes mellitus with hyperglycemia, without  long-term current use of insulin ([place])  She presents today for her consultation, accompanied by her sister, with no meter or logs to review.  Her most recent A1c on 5/25 was 12.4%.  She does not have a glucose meter at home.  She drinks ginger ale, soda, and vitamin water and does not eat on any sort of routine (sister says she will sometimes go 3 days without eating).  She does not engage in routine physical activity.  She is due for eye exam, has seen podiatrist in the past.  - Leslie Lyons has currently uncontrolled symptomatic type 2 DM since 61 years of age, with most recent A1c of 12.4 %.   -Recent labs reviewed.  - I had a long discussion with her about the progressive nature of diabetes and the pathology behind its complications. -her diabetes is complicated by neuropathy and she remains at a high risk for more acute and chronic complications which include CAD, CVA, CKD, retinopathy, and neuropathy. These are all discussed in detail with her.  The following Lifestyle Medicine recommendations according to [facility]) were discussed and offered to patient and she agrees to start the journey:  A. Whole Foods, Plant-based plate comprising of fruits and vegetables, plant-based proteins, whole-grain carbohydrates was discussed in detail with the patient.   A list for source of those nutrients were also provided to the patient.  Patient will use only water or unsweetened tea for hydration. B.  The need to stay away from risky substances including alcohol, smoking; obtaining 7 to 9 hours of restorative sleep, at least 150 minutes of moderate intensity exercise weekly, the importance of healthy social connections,  and stress reduction techniques were discussed. C.  A full color page of  Calorie density of various food groups per pound showing examples of each food groups was provided to the patient.  - I have counseled her on diet and weight management by  adopting a carbohydrate restricted/protein rich diet. Patient is encouraged to switch to unprocessed or minimally processed complex starch and increased protein intake (animal or plant source), fruits, and vegetables. -  she is advised to stick to a routine mealtimes to eat 3 meals a day and avoid unnecessary snacks (to snack only to correct hypoglycemia).   - she acknowledges that there is a room for improvement in her food and drink choices. - Suggestion is made for her to avoid simple carbohydrates from her diet including Cakes, Sweet Desserts, Ice Cream, Soda (diet and regular), Sweet Tea, Candies, Chips, Cookies, Store Bought Juices, Alcohol in Excess of 1-2 drinks a day, Artificial Sweeteners, Coffee Creamer, and "Sugar-free" Products. This will help patient to have more stable blood glucose profile and potentially avoid unintended weight gain.  - I have approached her with the following individualized plan to manage her diabetes and patient agrees:   -she is encouraged to start monitoring glucose 4 times daily, before meals and before bed, to log their readings on the clinic sheets provided, and bring them to review at follow up appointment in 2 weeks.  Sent in for meter to her local pharmacy.  - Adjustment parameters are given to her for hypo and hyperglycemia in writing. - she is encouraged to call clinic for blood glucose levels less than 70 or above 300 mg /dl. - she is advised  to continue lower her Glipizide to 5 mg XL daily with breakfast, therapeutically suitable for patient .  -She did not tolerate Janumet in the past (severe nausea with vomiting).  - she will be considered for incretin therapy as appropriate next visit.  - Specific targets for  A1c; LDL, HDL, and Triglycerides were discussed with the patient.  2) Blood Pressure /Hypertension:  her blood pressure is controlled to target.   she is advised to continue her current medications including Losartan 50 mg p.o. daily with  breakfast.  3) Lipids/Hyperlipidemia:    Review of her recent lipid panel from 06/30/21 showed uncontrolled LDL at 113 and elevated triglycerides of 282 .  She is not currently on any lipid lowering medications.   4)  Weight/Diet:  her Body mass index is 41.95 kg/m.  -  clearly complicating her diabetes care.   she is a candidate for weight loss. I discussed with her the fact that loss of 5 - 10% of her  current body weight will have the most impact on her diabetes management.  Exercise, and detailed carbohydrates information provided  -  detailed on discharge instructions.  5) Chronic Care/Health Maintenance: -she is on ACEI/ARB and not on Statin medications and is encouraged to initiate and continue to follow up with Ophthalmology, Dentist, Podiatrist at least yearly or according to recommendations, and advised to stay away from smoking. I have recommended yearly flu vaccine and pneumonia vaccine at least every 5 years; moderate intensity exercise for up to 150 minutes weekly; and sleep for at least 7 hours a day.  - she is advised to maintain close follow up with [name], MD for primary care needs, as well as her other providers for optimal and coordinated care.   - Time spent in this patient care: 60 min, of which > 50% was spent in counseling her about her diabetes and the rest reviewing her blood glucose logs, discussing her hypoglycemia and hyperglycemia episodes, reviewing her current and previous labs/studies (including abstraction from other facilities) and medications doses and developing a long term treatment plan based on the latest standards of care/guidelines; and documenting her care.    Please refer to Patient Instructions for Blood Glucose Monitoring and Insulin/Medications Dosing Guide" in media tab for additional information. Please also refer to "Patient Self Inventory" in the Media tab for reviewed elements of pertinent patient history.  Leslie Lyons  participated in the discussions, expressed understanding, and voiced agreement with the above plans.  All questions were answered to her satisfaction. she is encouraged to contact clinic should she have any questions or concerns prior to her return visit.     Follow up plan: - Return in about 2 weeks (around 05/07/2022) for Diabetes F/U, Bring meter and logs.    Rayetta Pigg, [facility] [address] [place], [place] [address] Phone: [phone] Fax: [phone]  04/23/2022, 11:32 AM

## 2022-04-26 ENCOUNTER — Encounter: Payer: Self-pay | Admitting: Family Medicine

## 2022-04-26 DIAGNOSIS — R7401 Elevation of levels of liver transaminase levels: Secondary | ICD-10-CM | POA: Insufficient documentation

## 2022-04-26 NOTE — Assessment & Plan Note (Signed)
Need Hepatitis panel

## 2022-04-26 NOTE — Assessment & Plan Note (Signed)
Uncontrolled , needs to resume meds and will reassess in 3 weeks DASH diet and commitment to daily physical activity for a minimum of 30 minutes discussed and encouraged, as a part of hypertension management. The importance of attaining a healthy weight is also discussed.     04/23/2022    9:20 AM 04/22/2022   10:04 AM 04/22/2022    9:21 AM 06/30/2021    9:44 AM 03/12/2021    9:20 AM 03/12/2021    8:58 AM 03/09/2021    2:30 PM  BP/Weight  Systolic BP 730 816 838 706 582 608 883  Diastolic BP 72 90 88 68 92 83 80  Wt. (Lbs) 236.8  238 210  211   BMI 41.95 kg/m2  42.16 kg/m2 37.2 kg/m2  37.38 kg/m2

## 2022-04-26 NOTE — Assessment & Plan Note (Signed)
  Patient re-educated about  the importance of commitment to a  minimum of 150 minutes of exercise per week as able.  The importance of healthy food choices with portion control discussed, as well as eating regularly and within a 12 hour window most days. The need to choose "clean , green" food 50 to 75% of the time is discussed, as well as to make water the primary drink and set a goal of 64 ounces water daily.       04/23/2022    9:20 AM 04/22/2022    9:21 AM 06/30/2021    9:44 AM  Weight /BMI  Weight 236 lb 12.8 oz 238 lb 210 lb  Height 5' 3"  (1.6 m) 5' 3"  (1.6 m) 5' 3"  (1.6 m)  BMI 41.95 kg/m2 42.16 kg/m2 37.2 kg/m2

## 2022-04-26 NOTE — Assessment & Plan Note (Signed)
Hyperlipidemia:Low fat diet discussed and encouraged.   Lipid Panel  Lab Results  Component Value Date   CHOL 150 04/22/2022   HDL 55 04/22/2022   LDLCALC 72 04/22/2022   TRIG 129 04/22/2022   CHOLHDL 2.7 04/22/2022

## 2022-04-26 NOTE — Assessment & Plan Note (Signed)
Inadequate response to medication refer Urology

## 2022-04-28 ENCOUNTER — Encounter: Payer: Self-pay | Admitting: *Deleted

## 2022-04-30 ENCOUNTER — Ambulatory Visit (HOSPITAL_COMMUNITY)
Admission: RE | Admit: 2022-04-30 | Discharge: 2022-04-30 | Disposition: A | Discharge status: Home / Self Care | Payer: Medicare Other | Source: Ambulatory Visit | Attending: Family Medicine | Admitting: Family Medicine

## 2022-04-30 ENCOUNTER — Encounter: Payer: Self-pay | Admitting: Family Medicine

## 2022-04-30 ENCOUNTER — Other Ambulatory Visit: Payer: Self-pay | Admitting: Family Medicine

## 2022-04-30 DIAGNOSIS — R7401 Elevation of levels of liver transaminase levels: Secondary | ICD-10-CM

## 2022-04-30 DIAGNOSIS — Z1231 Encounter for screening mammogram for malignant neoplasm of breast: Secondary | ICD-10-CM | POA: Insufficient documentation

## 2022-04-30 DIAGNOSIS — K760 Fatty (change of) liver, not elsewhere classified: Secondary | ICD-10-CM

## 2022-05-01 LAB — ACUTE HEP PANEL AND HEP B SURFACE AB
Hep A IgM: NEGATIVE
Hep B C IgM: NEGATIVE
Hep C Virus Ab: NONREACTIVE
Hepatitis B Surf Ab Quant: 3.9 m[IU]/mL — ABNORMAL LOW (ref >9.9)
Hepatitis B Surface Ag: NEGATIVE

## 2022-05-01 LAB — SPECIMEN STATUS REPORT

## 2022-05-03 ENCOUNTER — Encounter: Payer: Self-pay | Admitting: Internal Medicine

## 2022-05-07 ENCOUNTER — Ambulatory Visit (INDEPENDENT_AMBULATORY_CARE_PROVIDER_SITE_OTHER): Payer: Medicare Other | Admitting: Nurse Practitioner

## 2022-05-07 ENCOUNTER — Encounter: Payer: Self-pay | Admitting: Nurse Practitioner

## 2022-05-07 VITALS — BP 151/91 | HR 76 | Ht 63.0 in | Wt 241.0 lb

## 2022-05-07 DIAGNOSIS — E1165 Type 2 diabetes mellitus with hyperglycemia: Secondary | ICD-10-CM | POA: Diagnosis not present

## 2022-05-07 DIAGNOSIS — I10 Essential (primary) hypertension: Secondary | ICD-10-CM

## 2022-05-07 DIAGNOSIS — E782 Mixed hyperlipidemia: Secondary | ICD-10-CM

## 2022-05-07 MED ORDER — TRESIBA FLEXTOUCH 100 UNIT/ML ~~LOC~~ SOPN: 25.0000 [IU] | PEN_INJECTOR | Freq: Every day | SUBCUTANEOUS | 3 refills | Status: DC

## 2022-05-07 MED ORDER — PEN NEEDLES 32G X 4 MM MISC: 3 refills | Status: DC

## 2022-05-07 NOTE — Progress Notes (Signed)
Endocrinology Follow Up Note       05/07/2022, 11:24 AM   Subjective:    Patient ID: Leslie Lyons, female    DOB: Apr 29, 1961.  Leslie Lyons is being seen in follow up after being seen in consultation for management of currently uncontrolled symptomatic diabetes requested by  [name], MD.   Past Medical History:  Diagnosis Date   Abnormal transaminases    Anxiety    Bronchial asthma    with acute exacerbation   Cancer ([place]) 1980   cervical, diag  at age 51   Depression    Diabetes ([place])    Diabetes mellitus, type II ([place])    Headache(784.0)    with reduced vision in right eye for one month    Hyperlipidemia    Hypertension    Obesity     Past Surgical History:  Procedure Laterality Date   ABDOMINAL HYSTERECTOMY     COLONOSCOPY WITH ESOPHAGOGASTRODUODENOSCOPY (EGD)  11/2008   Dr. Arnoldo Morale: Conscious sedation.  Hiatal hernia.  Hemorrhoids. PATIENT REPORTS BEING AWAKE DURING PROCEDURE   CRYOTHERAPY     for abnormal pap   HEMORRHOID SURGERY     dr. Irving Shows   PARTIAL HYSTERECTOMY      Social History   Socioeconomic History   Marital status: Married    Spouse name: Not on file   Number of children: 2   Years of education: Not on file   Highest education level: Not on file  Occupational History   Occupation: homemaker   Tobacco Use   Smoking status: Former    Packs/day: 0.25    Years: 2.00    Total pack years: 0.50    Types: Cigarettes    Quit date: 12/04/2015    Years since quitting: 6.4   Smokeless tobacco: Never  Vaping Use   Vaping Use: Never used  Substance and Sexual Activity   Alcohol use: No   Drug use: No   Sexual activity: Not Currently  Other Topics Concern   Not on file  Social History Narrative   Lives at home with husband    Social Determinants of Health   Financial Resource Strain: Low Risk  (11/13/2021)   Overall Financial Resource Strain  (CARDIA)    Difficulty of Paying Living Expenses: Not hard at all  Food Insecurity: No Food Insecurity (11/13/2021)   Hunger Vital Sign    Worried About Running Out of Food in the Last Year: Never true    [place] in the Last Year: Never true  Transportation Needs: No Transportation Needs (11/13/2021)   PRAPARE - Hydrologist (Medical): No    Lack of Transportation (Non-Medical): No  Physical Activity: Inactive (11/13/2021)   Exercise Vital Sign    Days of Exercise per Week: 0 days    Minutes of Exercise per Session: 0 min  Stress: No Stress Concern Present (11/13/2021)   [place]    Feeling of Stress : Not at all  Social Connections: Moderately Isolated (11/13/2021)   Social Connection and Isolation Panel [NHANES]    Frequency of Communication with Friends and  Family: Twice a week    Frequency of Social Gatherings with Friends and Family: Twice a week    Attends Religious Services: Never    Marine scientist or Organizations: No    Attends Music therapist: Never    Marital Status: Married    Family History  Problem Relation Age of Onset   Cancer Mother        lung    COPD Father        emphysema   Hypertension Father    Cancer Maternal Aunt        breast   Cancer Paternal Uncle    Colon cancer Neg Hx     Outpatient Encounter Medications as of 05/07/2022  Medication Sig   insulin degludec (TRESIBA FLEXTOUCH) 100 UNIT/ML FlexTouch Pen Inject 25 Units into the skin at bedtime.   Insulin Pen Needle (PEN NEEDLES) 32G X 4 MM MISC Use to inject insulin once daily   albuterol (PROAIR HFA) 108 (90 Base) MCG/ACT inhaler Inhale 2 puffs into the lungs every 6 (six) hours as needed.   blood glucose meter kit and supplies Dispense based on patient and insurance preference. Test blood sugar once daily before breakfast. (FOR ICD-10 E10.9, E11.9). (Patient not taking:  Reported on 04/22/2022)   Blood Glucose Monitoring Suppl (ONETOUCH VERIO FLEX SYSTEM) w/Device KIT Use to monitor glucose twice daily   glipiZIDE (GLUCOTROL XL) 5 MG 24 hr tablet Take 1 tablet (5 mg total) by mouth daily with breakfast.   glucose blood (ONETOUCH VERIO) test strip Use as instructed to measure glucose twice daily   ibuprofen (ADVIL) 600 MG tablet Take 600 mg by mouth 3 (three) times daily.   Lancets (ONETOUCH ULTRASOFT) lancets Use as instructed to monitor glucose twice daily   losartan (COZAAR) 50 MG tablet Take 1 tablet (50 mg total) by mouth daily.   mirabegron ER (MYRBETRIQ) 25 MG TB24 tablet Take 1 tablet (25 mg total) by mouth daily.   mometasone (NASONEX) 50 MCG/ACT nasal spray Place 2 sprays into the nose daily. (Patient not taking: Reported on 05/07/2022)   mometasone-formoterol (DULERA) 200-5 MCG/ACT AERO Inhale 2 puffs into the lungs 2 (two) times daily. (Patient not taking: Reported on 05/07/2022)   No facility-administered encounter medications on file as of 05/07/2022.    ALLERGIES: Allergies  Allergen Reactions   Janumet [Sitagliptin-Metformin Hcl]     States she vomits up the whole pill after taking this   Robaxin [Methocarbamol] Swelling    VACCINATION STATUS: Immunization History  Administered Date(s) Administered   Influenza Whole 09/25/2007, 11/04/2009, 09/22/2010   Influenza,inj,Quad PF,6+ Mos 08/09/2013, 07/22/2014, 11/17/2015, 01/04/2018, 10/03/2018, 08/28/2019, 08/05/2020   Janssen (J&J) SARS-COV-2 Vaccination 05/20/2020   Pneumococcal Conjugate-13 04/25/2018   Pneumococcal Polysaccharide-23 07/31/2009, 07/05/2016   Td 03/26/2009    Diabetes She presents for her follow-up diabetic visit. She has type 2 diabetes mellitus. Onset time: Diagnosed at approx age of 49. Her disease course has been stable. There are no hypoglycemic associated symptoms. Associated symptoms include blurred vision, fatigue, foot paresthesias, polydipsia and polyuria. There are  no hypoglycemic complications. Symptoms are stable. Diabetic complications include peripheral neuropathy. Risk factors for coronary artery disease include diabetes mellitus, dyslipidemia, family history, obesity, hypertension, sedentary lifestyle and post-menopausal. Current diabetic treatment includes oral agent (monotherapy). She is compliant with treatment most of the time. Her weight is fluctuating minimally. She is following a generally unhealthy diet. When asked about meal planning, she reported none. She has had a previous visit  with a dietitian (went to DM class previously). Her overall blood glucose range is >200 mg/dl. (She presents today, accompanied by her son, with her glucose meter and logs showing persistent hyperglycemia overall.  She was not due for another A1c today.  She denies any hypoglycemia.  She continues to work on routine eating pattern.  Analysis of her meter shows 7-day average of 206 and 14-day average of 222.) An ACE inhibitor/angiotensin II receptor blocker is being taken. She sees a podiatrist.Eye exam is not current.     Review of systems  Constitutional: + Minimally fluctuating body weight, current Body mass index is 42.69 kg/m., + fatigue, no subjective hyperthermia, no subjective hypothermia Eyes: + blurry vision, no xerophthalmia ENT: no sore throat, no nodules palpated in throat, no dysphagia/odynophagia, no hoarseness Cardiovascular: no chest pain, no shortness of breath, no palpitations, no leg swelling Respiratory: no cough, no shortness of breath Gastrointestinal: no nausea/vomiting/diarrhea Musculoskeletal: no muscle/joint aches Skin: no rashes, no hyperemia Neurological: no tremors, no numbness, no tingling, no dizziness Psychiatric: no depression, no anxiety  Objective:     BP (!) 151/91   Pulse 76   Ht 5' 3"  (1.6 m)   Wt 241 lb (109.3 kg)   BMI 42.69 kg/m   Wt Readings from Last 3 Encounters:  05/07/22 241 lb (109.3 kg)  04/23/22 236 lb 12.8  oz (107.4 kg)  04/22/22 238 lb (108 kg)     BP Readings from Last 3 Encounters:  05/07/22 (!) 151/91  04/23/22 116/72  04/22/22 (!) 159/90      Physical Exam- Limited  Constitutional:  Body mass index is 42.69 kg/m. , not in acute distress, normal state of mind Eyes:  EOMI, no exophthalmos Neck: Supple Cardiovascular: RRR, no murmurs, rubs, or gallops, no edema Respiratory: Adequate breathing efforts, no crackles, rales, rhonchi, or wheezing Musculoskeletal: no gross deformities, strength intact in all four extremities, no gross restriction of joint movements Skin:  no rashes, no hyperemia Neurological: no tremor with outstretched hands    CMP ( most recent) CMP     Component Value Date/Time   NA 135 04/22/2022 1044   K 3.7 04/22/2022 1044   CL 93 (L) 04/22/2022 1044   CO2 24 04/22/2022 1044   GLUCOSE 328 (H) 04/22/2022 1044   GLUCOSE 144 (H) 03/09/2021 0944   BUN 9 04/22/2022 1044   CREATININE 0.96 04/22/2022 1044   CREATININE 1.56 (H) 01/07/2020 0913   CALCIUM 10.1 04/22/2022 1044   PROT 8.3 04/22/2022 1044   ALBUMIN 4.8 04/22/2022 1044   AST 87 (H) 04/22/2022 1044   ALT 55 (H) 04/22/2022 1044   ALKPHOS 115 04/22/2022 1044   BILITOT 0.3 04/22/2022 1044   GFRNONAA >60 03/09/2021 0944   GFRNONAA 69 07/13/2019 0816   GFRAA >60 08/05/2020 1107   GFRAA 80 07/13/2019 0816     Diabetic Labs (most recent): Lab Results  Component Value Date   HGBA1C 12.4 (A) 04/22/2022   HGBA1C 6.9 (H) 06/30/2021   HGBA1C 6.1 10/30/2020   MICROALBUR 22.2 (H) 08/05/2020   MICROALBUR 8.6 03/27/2019   MICROALBUR 1.9 01/06/2018     Lipid Panel ( most recent) Lipid Panel     Component Value Date/Time   CHOL 150 04/22/2022 1044   TRIG 129 04/22/2022 1044   HDL 55 04/22/2022 1044   CHOLHDL 2.7 04/22/2022 1044   CHOLHDL 2.9 08/05/2020 1107   VLDL 25 08/05/2020 1107   LDLCALC 72 04/22/2022 1044   LDLCALC 169 (H) 07/13/2019 2707  LABVLDL 23 04/22/2022 1044      Lab  Results  Component Value Date   TSH 1.780 04/22/2022   TSH 2.630 06/30/2021   TSH 1.426 08/05/2020   TSH 0.869 04/22/2020   TSH 3.56 03/27/2019   TSH 1.80 01/04/2018   TSH 2.75 05/12/2016   TSH 1.915 07/05/2013   TSH 0.829 06/25/2011   TSH 1.159 09/28/2010           Assessment & Plan:   1) Type 2 diabetes mellitus with hyperglycemia, without long-term current use of insulin ([place])  She presents today, accompanied by her son, with her glucose meter and logs showing persistent hyperglycemia overall.  She was not due for another A1c today.  She denies any hypoglycemia.  She continues to work on routine eating pattern.  Analysis of her meter shows 7-day average of 206 and 14-day average of 222.  - Leslie Lyons has currently uncontrolled symptomatic type 2 DM since 61 years of age, with most recent A1c of 12.4 %.   -Recent labs reviewed.  - I had a long discussion with her about the progressive nature of diabetes and the pathology behind its complications. -her diabetes is complicated by neuropathy and she remains at a high risk for more acute and chronic complications which include CAD, CVA, CKD, retinopathy, and neuropathy. These are all discussed in detail with her.  The following Lifestyle Medicine recommendations according to [facility]) were discussed and offered to patient and she agrees to start the journey:  A. Whole Foods, Plant-based plate comprising of fruits and vegetables, plant-based proteins, whole-grain carbohydrates was discussed in detail with the patient.   A list for source of those nutrients were also provided to the patient.  Patient will use only water or unsweetened tea for hydration. B.  The need to stay away from risky substances including alcohol, smoking; obtaining 7 to 9 hours of restorative sleep, at least 150 minutes of moderate intensity exercise weekly, the importance of healthy social connections,  and stress  reduction techniques were discussed. C.  A full color page of  Calorie density of various food groups per pound showing examples of each food groups was provided to the patient.  - Nutritional counseling repeated at each appointment due to patients tendency to fall back in to old habits.  - The patient admits there is a room for improvement in their diet and drink choices. -  Suggestion is made for the patient to avoid simple carbohydrates from their diet including Cakes, Sweet Desserts / Pastries, Ice Cream, Soda (diet and regular), Sweet Tea, Candies, Chips, Cookies, Sweet Pastries, Store Bought Juices, Alcohol in Excess of 1-2 drinks a day, Artificial Sweeteners, Coffee Creamer, and "Sugar-free" Products. This will help patient to have stable blood glucose profile and potentially avoid unintended weight gain.   - I encouraged the patient to switch to unprocessed or minimally processed complex starch and increased protein intake (animal or plant source), fruits, and vegetables.   - Patient is advised to stick to a routine mealtimes to eat 3 meals a day and avoid unnecessary snacks (to snack only to correct hypoglycemia).  - I have approached her with the following individualized plan to manage her diabetes and patient agrees:   -I discussed and initiated basal insulin today (for cost purposes) with Tresiba 25 units SQ nightly (sample provided).  We went over proper insulin injection technique and rotation sites in the room today.  -she is encouraged to continue  monitoring blood glucose twice daily, before breakfast and before bed, and to call the clinic if he has readings less than 70 or above 300 for 3 tests in a row.   - Adjustment parameters are given to her for hypo and hyperglycemia in writing.  - she is advised to continue her Glipizide to 5 mg XL daily with breakfast, therapeutically suitable for patient .  -She did not tolerate Janumet in the past (severe nausea with vomiting).  -  she will be considered for incretin therapy as appropriate next visit (concerned with cost-therefore will hold off on this for now).  - Specific targets for  A1c; LDL, HDL, and Triglycerides were discussed with the patient.  2) Blood Pressure /Hypertension:  her blood pressure is controlled to target.   she is advised to continue her current medications including Losartan 50 mg p.o. daily with breakfast.  3) Lipids/Hyperlipidemia:    Review of her recent lipid panel from 06/30/21 showed uncontrolled LDL at 113 and elevated triglycerides of 282 .  She is not currently on any lipid lowering medications.   4)  Weight/Diet:  her Body mass index is 42.69 kg/m.  -  clearly complicating her diabetes care.   she is a candidate for weight loss. I discussed with her the fact that loss of 5 - 10% of her  current body weight will have the most impact on her diabetes management.  Exercise, and detailed carbohydrates information provided  -  detailed on discharge instructions.  5) Chronic Care/Health Maintenance: -she is on ACEI/ARB and not on Statin medications and is encouraged to initiate and continue to follow up with Ophthalmology, Dentist, Podiatrist at least yearly or according to recommendations, and advised to stay away from smoking. I have recommended yearly flu vaccine and pneumonia vaccine at least every 5 years; moderate intensity exercise for up to 150 minutes weekly; and sleep for at least 7 hours a day.  - she is advised to maintain close follow up with [name], MD for primary care needs, as well as her other providers for optimal and coordinated care.     I spent 40 minutes in the care of the patient today including review of labs from [facility], Lipids, Thyroid Function, Hematology (current and previous including abstractions from other facilities); face-to-face time discussing  her blood glucose readings/logs, discussing hypoglycemia and hyperglycemia episodes and symptoms, medications  doses, her options of short and long term treatment based on the latest standards of care / guidelines;  discussion about incorporating lifestyle medicine;  and documenting the encounter.    Please refer to Patient Instructions for Blood Glucose Monitoring and Insulin/Medications Dosing Guide"  in media tab for additional information. Please  also refer to " Patient Self Inventory" in the Media  tab for reviewed elements of pertinent patient history.  Leslie Lyons participated in the discussions, expressed understanding, and voiced agreement with the above plans.  All questions were answered to her satisfaction. she is encouraged to contact clinic should she have any questions or concerns prior to her return visit.     Follow up plan: - Return in about 1 month (around 06/06/2022) for Diabetes F/U, Bring meter and logs.    Leslie Lyons, [facility] [address] [place], [place] [address] Phone: [phone] Fax: [phone]  05/07/2022, 11:24 AM

## 2022-05-07 NOTE — Patient Instructions (Signed)
Diabetes Mellitus and Foot Care Foot care is an important part of your health, especially when you have diabetes. Diabetes may cause you to have problems because of poor blood flow (circulation) to your feet and legs, which can cause your skin to: Become thinner and drier. Break more easily. Heal more slowly. Peel and crack. You may also have nerve damage (neuropathy) in your legs and feet, causing decreased feeling in them. This means that you may not notice minor injuries to your feet that could lead to more serious problems. Noticing and addressing any potential problems early is the best way to prevent future foot problems. How to care for your feet Foot hygiene  Wash your feet daily with warm water and mild soap. Do not use hot water. Then, pat your feet and the areas between your toes until they are completely dry. Do not soak your feet as this can dry your skin. Trim your toenails straight across. Do not dig under them or around the cuticle. File the edges of your nails with an emery board or nail file. Apply a moisturizing lotion or petroleum jelly to the skin on your feet and to dry, brittle toenails. Use lotion that does not contain alcohol and is unscented. Do not apply lotion between your toes. Shoes and socks Wear clean socks or stockings every day. Make sure they are not too tight. Do not wear knee-high stockings since they may decrease blood flow to your legs. Wear shoes that fit properly and have enough cushioning. Always look in your shoes before you put them on to be sure there are no objects inside. To break in new shoes, wear them for just a few hours a day. This prevents injuries on your feet. Wounds, scrapes, corns, and calluses  Check your feet daily for blisters, cuts, bruises, sores, and redness. If you cannot see the bottom of your feet, use a mirror or ask someone for help. Do not cut corns or calluses or try to remove them with medicine. If you find a minor scrape,  cut, or break in the skin on your feet, keep it and the skin around it clean and dry. You may clean these areas with mild soap and water. Do not clean the area with peroxide, alcohol, or iodine. If you have a wound, scrape, corn, or callus on your foot, look at it several times a day to make sure it is healing and not infected. Check for: Redness, swelling, or pain. Fluid or blood. Warmth. Pus or a bad smell. General tips Do not cross your legs. This may decrease blood flow to your feet. Do not use heating pads or hot water bottles on your feet. They may burn your skin. If you have lost feeling in your feet or legs, you may not know this is happening until it is too late. Protect your feet from hot and cold by wearing shoes, such as at the beach or on hot pavement. Schedule a complete foot exam at least once a year (annually) or more often if you have foot problems. Report any cuts, sores, or bruises to your health care provider immediately. Where to find more information American Diabetes Association: www.diabetes.org Association of Diabetes Care & Education Specialists: www.diabeteseducator.org Contact a health care provider if: You have a medical condition that increases your risk of infection and you have any cuts, sores, or bruises on your feet. You have an injury that is not healing. You have redness on your legs or feet. You   feel burning or tingling in your legs or feet. You have pain or cramps in your legs and feet. Your legs or feet are numb. Your feet always feel cold. You have pain around any toenails. Get help right away if: You have a wound, scrape, corn, or callus on your foot and: You have pain, swelling, or redness that gets worse. You have fluid or blood coming from the wound, scrape, corn, or callus. Your wound, scrape, corn, or callus feels warm to the touch. You have pus or a bad smell coming from the wound, scrape, corn, or callus. You have a fever. You have a red  line going up your leg. Summary Check your feet every day for blisters, cuts, bruises, sores, and redness. Apply a moisturizing lotion or petroleum jelly to the skin on your feet and to dry, brittle toenails. Wear shoes that fit properly and have enough cushioning. If you have foot problems, report any cuts, sores, or bruises to your health care provider immediately. Schedule a complete foot exam at least once a year (annually) or more often if you have foot problems. This information is not intended to replace advice given to you by your health care provider. Make sure you discuss any questions you have with your health care provider. Document Revised: 06/05/2020 Document Reviewed: 06/05/2020 Elsevier Patient Education  2023 Elsevier Inc.  

## 2022-05-16 NOTE — Progress Notes (Incomplete)
H&P  Chief Complaint: ***  History of Present Illness: Leslie Lyons is a 61 y.o. year old female ***  Past Medical History:  Diagnosis Date   Abnormal transaminases    Anxiety    Bronchial asthma    with acute exacerbation   Cancer ([place]) 1980   cervical, diag  at age 37   Depression    Diabetes ([place])    Diabetes mellitus, type II ([place])    Headache(784.0)    with reduced vision in right eye for one month    Hyperlipidemia    Hypertension    Obesity     Past Surgical History:  Procedure Laterality Date   ABDOMINAL HYSTERECTOMY     COLONOSCOPY WITH ESOPHAGOGASTRODUODENOSCOPY (EGD)  11/2008   Dr. Arnoldo Morale: Conscious sedation.  Hiatal hernia.  Hemorrhoids. PATIENT REPORTS BEING AWAKE DURING PROCEDURE   CRYOTHERAPY     for abnormal pap   HEMORRHOID SURGERY     dr. Irving Shows   PARTIAL HYSTERECTOMY      Home Medications:  (Not in a hospital admission)   Allergies:  Allergies  Allergen Reactions   Janumet [Sitagliptin-Metformin Hcl]     States she vomits up the whole pill after taking this   Robaxin [Methocarbamol] Swelling    Family History  Problem Relation Age of Onset   Cancer Mother        lung    COPD Father        emphysema   Hypertension Father    Cancer Maternal Aunt        breast   Cancer Paternal Uncle    Colon cancer Neg Hx     Social History:  reports that she quit smoking about 6 years ago. Her smoking use included cigarettes. She has a 0.50 pack-year smoking history. She has never used smokeless tobacco. She reports that she does not drink alcohol and does not use drugs.  ROS: A complete review of systems was performed.  All systems are negative except for pertinent findings as noted.  Physical Exam:  Vital signs in last 24 hours: @VSRANGES @ General:  Alert and oriented, No acute distress HEENT: Normocephalic, atraumatic Neck: No JVD or lymphadenopathy Cardiovascular: Regular rate  Lungs: Normal inspiratory/expiratory  excursion Abdomen: Soft, nontender, nondistended, no abdominal masses Back: No CVA tenderness Extremities: No edema Neurologic: Grossly intact  I have reviewed prior pt notes  I have reviewed notes from referring/previous physicians  I have reviewed urinalysis results  I have independently reviewed prior imaging  I have reviewed prior urine culture   Impression/Assessment:  ***  Plan:  ***  Leslie Lyons 05/16/2022, 9:00 PM  Leslie Lyons. Nashly Olsson MD

## 2022-05-18 ENCOUNTER — Ambulatory Visit: Payer: Medicare Other | Admitting: Urology

## 2022-05-18 VITALS — BP 120/68 | HR 110 | Ht 63.0 in | Wt 241.0 lb

## 2022-05-18 DIAGNOSIS — R32 Unspecified urinary incontinence: Secondary | ICD-10-CM

## 2022-05-18 DIAGNOSIS — R350 Frequency of micturition: Secondary | ICD-10-CM | POA: Diagnosis not present

## 2022-05-18 DIAGNOSIS — R3915 Urgency of urination: Secondary | ICD-10-CM

## 2022-05-18 DIAGNOSIS — N3281 Overactive bladder: Secondary | ICD-10-CM

## 2022-05-18 LAB — BLADDER SCAN AMB NON-IMAGING: Scan Result: 19

## 2022-05-18 NOTE — Progress Notes (Signed)
post void residual =55m

## 2022-05-18 NOTE — Progress Notes (Signed)
H&P  Chief Complaint: Urinary incontinence  History of Present Illness: Leslie Lyons is a 61 y.o. year old female referred by Dr. Tula Nakayama for evaluation and management of urinary incontinence.  She is here for the first time since 2017.  At that time, she was on both Myrbetriq and Solifenacin for her urinary symptoms.  This improved her leakage significantly.  The patient is unable to ambulate normally.  She has urgency, frequency and near total urgency incontinence.  She goes through several pull-ups a day, and at times a couple of days.  She wets a day and night.  She has not been treated for any urinary infections recently.  Her dose of Myrbetriq  Past Medical History:  Diagnosis Date   Abnormal transaminases    Anxiety    Bronchial asthma    with acute exacerbation   Cancer ([place]) 1980   cervical, diag  at age 64   Depression    Diabetes ([place])    Diabetes mellitus, type II ([place])    Headache(784.0)    with reduced vision in right eye for one month    Hyperlipidemia    Hypertension    Obesity     Past Surgical History:  Procedure Laterality Date   ABDOMINAL HYSTERECTOMY     COLONOSCOPY WITH ESOPHAGOGASTRODUODENOSCOPY (EGD)  11/2008   Dr. Arnoldo Morale: Conscious sedation.  Hiatal hernia.  Hemorrhoids. PATIENT REPORTS BEING AWAKE DURING PROCEDURE   CRYOTHERAPY     for abnormal pap   HEMORRHOID SURGERY     dr. Irving Shows   PARTIAL HYSTERECTOMY      Home Medications:  (Not in a hospital admission)   Allergies:  Allergies  Allergen Reactions   Janumet [Sitagliptin-Metformin Hcl]     States she vomits up the whole pill after taking this   Robaxin [Methocarbamol] Swelling    Family History  Problem Relation Age of Onset   Cancer Mother        lung    COPD Father        emphysema   Hypertension Father    Cancer Maternal Aunt        breast   Cancer Paternal Uncle    Colon cancer Neg Hx     Social History:  reports that she quit smoking about 6 years  ago. Her smoking use included cigarettes. She has a 0.50 pack-year smoking history. She has never used smokeless tobacco. She reports that she does not drink alcohol and does not use drugs.  ROS: A complete review of systems was performed.  All systems are negative except for pertinent findings as noted.  Physical Exam:  Vital signs in last 24 hours: @VSRANGES @ General:  Alert and oriented, No acute distress HEENT: Normocephalic, atraumatic Neck: No JVD or lymphadenopathy Cardiovascular: Regular rate  Lungs: Normal inspiratory/expiratory excursion Extremities: No edema Neurologic: Grossly intact  I have reviewed prior pt notes  I have reviewed notes from referring/previous physicians  I have independently reviewed prior imaging--residual urine volume 39 mL.  Last urinalysis performed in April 2022 was unremarkable   Impression/Assessment:  Overactive bladder with near total urgency incontinence.  On a small dose of Myrbetriq  Plan:  1.  I gave her an overactive bladder guide sheet  2.  I will increase her Myrbetriq to 50 mg a day.  She was given 6 weeks of samples  3.  I will have her come back in 6 weeks.  If significant improvement we will keep her on that dose  of Myrbetriq.  If not, I will add Solifenacin  Lillette Boxer Gissele Narducci 05/18/2022, 8:30 AM  Lillette Boxer. Sanyla Summey MD

## 2022-05-25 ENCOUNTER — Other Ambulatory Visit: Payer: Self-pay

## 2022-05-25 MED ORDER — LOSARTAN POTASSIUM 50 MG PO TABS: 50.0000 mg | ORAL_TABLET | Freq: Every day | ORAL | 2 refills | Status: DC

## 2022-06-08 ENCOUNTER — Ambulatory Visit (INDEPENDENT_AMBULATORY_CARE_PROVIDER_SITE_OTHER): Payer: Medicare Other | Admitting: Nurse Practitioner

## 2022-06-08 ENCOUNTER — Encounter: Payer: Self-pay | Admitting: Nurse Practitioner

## 2022-06-08 VITALS — BP 125/84 | HR 98 | Ht 63.0 in | Wt 238.0 lb

## 2022-06-08 DIAGNOSIS — I10 Essential (primary) hypertension: Secondary | ICD-10-CM

## 2022-06-08 DIAGNOSIS — Z794 Long term (current) use of insulin: Secondary | ICD-10-CM | POA: Diagnosis not present

## 2022-06-08 DIAGNOSIS — E1122 Type 2 diabetes mellitus with diabetic chronic kidney disease: Secondary | ICD-10-CM | POA: Diagnosis not present

## 2022-06-08 DIAGNOSIS — N182 Chronic kidney disease, stage 2 (mild): Secondary | ICD-10-CM

## 2022-06-08 DIAGNOSIS — E782 Mixed hyperlipidemia: Secondary | ICD-10-CM

## 2022-06-08 NOTE — Progress Notes (Signed)
Endocrinology Follow Up Note       06/08/2022, 4:22 PM   Subjective:    Patient ID: Leslie Lyons, female    DOB: [birth year].  Leslie Lyons is being seen in follow up after being seen in consultation for management of currently uncontrolled symptomatic diabetes requested by  [name], MD.   Past Medical History:  Diagnosis Date   Abnormal transaminases    Anxiety    Bronchial asthma    with acute exacerbation   Cancer ([place]) 1980   cervical, diag  at age 74   Depression    Diabetes ([place])    Diabetes mellitus, type II ([place])    Headache(784.0)    with reduced vision in right eye for one month    Hyperlipidemia    Hypertension    Obesity     Past Surgical History:  Procedure Laterality Date   ABDOMINAL HYSTERECTOMY     COLONOSCOPY WITH ESOPHAGOGASTRODUODENOSCOPY (EGD)  11/2008   Dr. Arnoldo Morale: Conscious sedation.  Hiatal hernia.  Hemorrhoids. PATIENT REPORTS BEING AWAKE DURING PROCEDURE   CRYOTHERAPY     for abnormal pap   HEMORRHOID SURGERY     dr. Irving Shows   PARTIAL HYSTERECTOMY      Social History   Socioeconomic History   Marital status: Married    Spouse name: Not on file   Number of children: 2   Years of education: Not on file   Highest education level: Not on file  Occupational History   Occupation: homemaker   Tobacco Use   Smoking status: Former    Packs/day: 0.25    Years: 2.00    Total pack years: 0.50    Types: Cigarettes    Quit date: 12/04/2015    Years since quitting: 6.5   Smokeless tobacco: Never  Vaping Use   Vaping Use: Never used  Substance and Sexual Activity   Alcohol use: No   Drug use: No   Sexual activity: Not Currently  Other Topics Concern   Not on file  Social History Narrative   Lives at home with husband    Social Determinants of Health   Financial Resource Strain: Low Risk  (11/13/2021)   Overall Financial Resource Strain  (CARDIA)    Difficulty of Paying Living Expenses: Not hard at all  Food Insecurity: No Food Insecurity (11/13/2021)   Hunger Vital Sign    Worried About Running Out of Food in the Last Year: Never true    [place] in the Last Year: Never true  Transportation Needs: No Transportation Needs (11/13/2021)   PRAPARE - Hydrologist (Medical): No    Lack of Transportation (Non-Medical): No  Physical Activity: Inactive (11/13/2021)   Exercise Vital Sign    Days of Exercise per Week: 0 days    Minutes of Exercise per Session: 0 min  Stress: No Stress Concern Present (11/13/2021)   [place]    Feeling of Stress : Not at all  Social Connections: Moderately Isolated (11/13/2021)   Social Connection and Isolation Panel [NHANES]    Frequency of Communication with Friends and  Family: Twice a week    Frequency of Social Gatherings with Friends and Family: Twice a week    Attends Religious Services: Never    Marine scientist or Organizations: No    Attends Music therapist: Never    Marital Status: Married    Family History  Problem Relation Age of Onset   Cancer Mother        lung    COPD Father        emphysema   Hypertension Father    Cancer Maternal Aunt        breast   Cancer Paternal Uncle    Colon cancer Neg Hx     Outpatient Encounter Medications as of 06/08/2022  Medication Sig   albuterol (PROAIR HFA) 108 (90 Base) MCG/ACT inhaler Inhale 2 puffs into the lungs every 6 (six) hours as needed.   blood glucose meter kit and supplies Dispense based on patient and insurance preference. Test blood sugar once daily before breakfast. (FOR ICD-10 E10.9, E11.9).   Blood Glucose Monitoring Suppl (ONETOUCH VERIO FLEX SYSTEM) w/Device KIT Use to monitor glucose twice daily   glipiZIDE (GLUCOTROL XL) 5 MG 24 hr tablet Take 1 tablet (5 mg total) by mouth daily with breakfast.    glucose blood (ONETOUCH VERIO) test strip Use as instructed to measure glucose twice daily   ibuprofen (ADVIL) 600 MG tablet Take 600 mg by mouth 3 (three) times daily.   insulin degludec (TRESIBA FLEXTOUCH) 100 UNIT/ML FlexTouch Pen Inject 25 Units into the skin at bedtime.   Insulin Pen Needle (PEN NEEDLES) 32G X 4 MM MISC Use to inject insulin once daily   Lancets (ONETOUCH ULTRASOFT) lancets Use as instructed to monitor glucose twice daily   losartan (COZAAR) 50 MG tablet Take 1 tablet (50 mg total) by mouth daily.   mirabegron ER (MYRBETRIQ) 25 MG TB24 tablet Take 1 tablet (25 mg total) by mouth daily.   mometasone (NASONEX) 50 MCG/ACT nasal spray Place 2 sprays into the nose daily. (Patient not taking: Reported on 05/18/2022)   mometasone-formoterol (DULERA) 200-5 MCG/ACT AERO Inhale 2 puffs into the lungs 2 (two) times daily. (Patient not taking: Reported on 05/18/2022)   No facility-administered encounter medications on file as of 06/08/2022.    ALLERGIES: Allergies  Allergen Reactions   Janumet [Sitagliptin-Metformin Hcl]     States she vomits up the whole pill after taking this   Robaxin [Methocarbamol] Swelling    VACCINATION STATUS: Immunization History  Administered Date(s) Administered   Influenza Whole 09/25/2007, 11/04/2009, 09/22/2010   Influenza,inj,Quad PF,6+ Mos 08/09/2013, 07/22/2014, 11/17/2015, 01/04/2018, 10/03/2018, 08/28/2019, 08/05/2020   Janssen (J&J) SARS-COV-2 Vaccination 05/20/2020   Pneumococcal Conjugate-13 04/25/2018   Pneumococcal Polysaccharide-23 07/31/2009, 07/05/2016   Td 03/26/2009    Diabetes She presents for her follow-up diabetic visit. She has type 2 diabetes mellitus. Onset time: Diagnosed at approx age of 42. Her disease course has been improving. There are no hypoglycemic associated symptoms. Associated symptoms include blurred vision, fatigue, foot paresthesias, polydipsia and polyuria. There are no hypoglycemic complications. Symptoms  are improving. Diabetic complications include peripheral neuropathy. Risk factors for coronary artery disease include diabetes mellitus, dyslipidemia, family history, obesity, hypertension, sedentary lifestyle and post-menopausal. Current diabetic treatment includes oral agent (monotherapy) and insulin injections. She is compliant with treatment most of the time. Her weight is decreasing steadily. She is following a generally healthy diet. When asked about meal planning, she reported none. She has had a previous visit with a dietitian (  went to DM class previously). Her home blood glucose trend is decreasing steadily. Her breakfast blood glucose range is generally 140-180 mg/dl. Her bedtime blood glucose range is generally 140-180 mg/dl. Her overall blood glucose range is 140-180 mg/dl. (She presents today, accompanied by family member, with her meter and logs showing drastically improved glycemic profile overall.  She was not yet due for another A1c.  She has adjusted to addition of basal insulin well.  She denies any significant hypoglycemia, reports she did get mild symptoms when glucose dropped to 88 and she did correct with fruit and snack.  She has really adopted to the lifestyle medicine introduced to her at last visit.) An ACE inhibitor/angiotensin II receptor blocker is being taken. She sees a podiatrist.Eye exam is not current.     Review of systems  Constitutional: + steadily decreasing body weight, current Body mass index is 42.16 kg/m., + fatigue-improving, no subjective hyperthermia, no subjective hypothermia Eyes: + blurry vision, no xerophthalmia ENT: no sore throat, no nodules palpated in throat, no dysphagia/odynophagia, no hoarseness Cardiovascular: no chest pain, no shortness of breath, no palpitations, no leg swelling Respiratory: no cough, no shortness of breath Gastrointestinal: no nausea/vomiting/diarrhea Musculoskeletal: no muscle/joint aches Skin: no rashes, no  hyperemia Neurological: no tremors, no numbness, no tingling, no dizziness Psychiatric: no depression, no anxiety  Objective:     BP 125/84   Pulse 98   Ht 5' 3"  (1.6 m)   Wt 238 lb (108 kg)   BMI 42.16 kg/m   Wt Readings from Last 3 Encounters:  06/08/22 238 lb (108 kg)  05/18/22 241 lb (109.3 kg)  05/07/22 241 lb (109.3 kg)     BP Readings from Last 3 Encounters:  06/08/22 125/84  05/18/22 120/68  05/07/22 (!) 151/91     Physical Exam- Limited  Constitutional:  Body mass index is 42.16 kg/m. , not in acute distress, normal state of mind Eyes:  EOMI, no exophthalmos Neck: Supple Cardiovascular: RRR, no murmurs, rubs, or gallops, no edema Respiratory: Adequate breathing efforts, no crackles, rales, rhonchi, or wheezing Musculoskeletal: no gross deformities, strength intact in all four extremities, no gross restriction of joint movements Skin:  no rashes, no hyperemia Neurological: no tremor with outstretched hands    CMP ( most recent) CMP     Component Value Date/Time   NA 135 04/22/2022 1044   K 3.7 04/22/2022 1044   CL 93 (L) 04/22/2022 1044   CO2 24 04/22/2022 1044   GLUCOSE 328 (H) 04/22/2022 1044   GLUCOSE 144 (H) 03/09/2021 0944   BUN 9 04/22/2022 1044   CREATININE 0.96 04/22/2022 1044   CREATININE 1.56 (H) 01/07/2020 0913   CALCIUM 10.1 04/22/2022 1044   PROT 8.3 04/22/2022 1044   ALBUMIN 4.8 04/22/2022 1044   AST 87 (H) 04/22/2022 1044   ALT 55 (H) 04/22/2022 1044   ALKPHOS 115 04/22/2022 1044   BILITOT 0.3 04/22/2022 1044   GFRNONAA >60 03/09/2021 0944   GFRNONAA 69 07/13/2019 0816   GFRAA >60 08/05/2020 1107   GFRAA 80 07/13/2019 0816     Diabetic Labs (most recent): Lab Results  Component Value Date   HGBA1C 12.4 (A) 04/22/2022   HGBA1C 6.9 (H) 06/30/2021   HGBA1C 6.1 10/30/2020   MICROALBUR 22.2 (H) 08/05/2020   MICROALBUR 8.6 03/27/2019   MICROALBUR 1.9 01/06/2018     Lipid Panel ( most recent) Lipid Panel     Component  Value Date/Time   CHOL 150 04/22/2022 1044   TRIG  129 04/22/2022 1044   HDL 55 04/22/2022 1044   CHOLHDL 2.7 04/22/2022 1044   CHOLHDL 2.9 08/05/2020 1107   VLDL 25 08/05/2020 1107   LDLCALC 72 04/22/2022 1044   LDLCALC 169 (H) 07/13/2019 0816   LABVLDL 23 04/22/2022 1044      Lab Results  Component Value Date   TSH 1.780 04/22/2022   TSH 2.630 06/30/2021   TSH 1.426 08/05/2020   TSH 0.869 04/22/2020   TSH 3.56 03/27/2019   TSH 1.80 01/04/2018   TSH 2.75 05/12/2016   TSH 1.915 07/05/2013   TSH 0.829 06/25/2011   TSH 1.159 09/28/2010        Assessment & Plan:   1) Type 2 diabetes mellitus with hyperglycemia, without long-term current use of insulin ([place])  She presents today, accompanied by family member, with her meter and logs showing drastically improved glycemic profile overall.  She was not yet due for another A1c.  She has adjusted to addition of basal insulin well.  She denies any significant hypoglycemia, reports she did get mild symptoms when glucose dropped to 88 and she did correct with fruit and snack.  She has really adopted to the lifestyle medicine introduced to her at last visit.  Analysis of her meter shows 7-day average of 141, 14-day average of 154, 30-day average of 188.  - Leslie Lyons has currently uncontrolled symptomatic type 2 DM since 61 years of age, with most recent A1c of 12.4 %.   -Recent labs reviewed.  - I had a long discussion with her about the progressive nature of diabetes and the pathology behind its complications. -her diabetes is complicated by neuropathy and she remains at a high risk for more acute and chronic complications which include CAD, CVA, CKD, retinopathy, and neuropathy. These are all discussed in detail with her.  The following Lifestyle Medicine recommendations according to [facility]) were discussed and offered to patient and she agrees to start the journey:  A. Whole Foods,  Plant-based plate comprising of fruits and vegetables, plant-based proteins, whole-grain carbohydrates was discussed in detail with the patient.   A list for source of those nutrients were also provided to the patient.  Patient will use only water or unsweetened tea for hydration. B.  The need to stay away from risky substances including alcohol, smoking; obtaining 7 to 9 hours of restorative sleep, at least 150 minutes of moderate intensity exercise weekly, the importance of healthy social connections,  and stress reduction techniques were discussed. C.  A full color page of  Calorie density of various food groups per pound showing examples of each food groups was provided to the patient.  - Nutritional counseling repeated at each appointment due to patients tendency to fall back in to old habits.  - The patient admits there is a room for improvement in their diet and drink choices. -  Suggestion is made for the patient to avoid simple carbohydrates from their diet including Cakes, Sweet Desserts / Pastries, Ice Cream, Soda (diet and regular), Sweet Tea, Candies, Chips, Cookies, Sweet Pastries, Store Bought Juices, Alcohol in Excess of 1-2 drinks a day, Artificial Sweeteners, Coffee Creamer, and "Sugar-free" Products. This will help patient to have stable blood glucose profile and potentially avoid unintended weight gain.   - I encouraged the patient to switch to unprocessed or minimally processed complex starch and increased protein intake (animal or plant source), fruits, and vegetables.   - Patient is advised to stick to  a routine mealtimes to eat 3 meals a day and avoid unnecessary snacks (to snack only to correct hypoglycemia).  - I have approached her with the following individualized plan to manage her diabetes and patient agrees:   -Given her improved glycemic profile, she is advised to continue Tresiba 25 units SQ nightly and Glipizide 5 mg XL daily with breakfast.  Her ultimate goal is to  stop the insulin in the future.  -she is encouraged to continue monitoring blood glucose twice daily, before breakfast and before bed, and to call the clinic if he has readings less than 70 or above 300 for 3 tests in a row.   - Adjustment parameters are given to her for hypo and hyperglycemia in writing.  -She did not tolerate Janumet in the past (severe nausea with vomiting).  - she will be considered for incretin therapy as appropriate next visit (concerned with cost-therefore will hold off on this for now).  - Specific targets for  A1c; LDL, HDL, and Triglycerides were discussed with the patient.  2) Blood Pressure /Hypertension:  her blood pressure is controlled to target.   she is advised to continue her current medications including Losartan 50 mg p.o. daily with breakfast.  3) Lipids/Hyperlipidemia:    Review of her recent lipid panel from 04/22/22 showed controlled LDL at 72 .  She is not currently on any lipid lowering medications.   4)  Weight/Diet:  her Body mass index is 42.16 kg/m.  -  clearly complicating her diabetes care.   she is a candidate for weight loss. I discussed with her the fact that loss of 5 - 10% of her  current body weight will have the most impact on her diabetes management.  Exercise, and detailed carbohydrates information provided  -  detailed on discharge instructions.  5) Chronic Care/Health Maintenance: -she is on ACEI/ARB and not on Statin medications and is encouraged to initiate and continue to follow up with Ophthalmology, Dentist, Podiatrist at least yearly or according to recommendations, and advised to stay away from smoking. I have recommended yearly flu vaccine and pneumonia vaccine at least every 5 years; moderate intensity exercise for up to 150 minutes weekly; and sleep for at least 7 hours a day.  - she is advised to maintain close follow up with [name], MD for primary care needs, as well as her other providers for optimal and  coordinated care.     I spent 43 minutes in the care of the patient today including review of labs from [place], Lipids, Thyroid Function, Hematology (current and previous including abstractions from other facilities); face-to-face time discussing  her blood glucose readings/logs, discussing hypoglycemia and hyperglycemia episodes and symptoms, medications doses, her options of short and long term treatment based on the latest standards of care / guidelines;  discussion about incorporating lifestyle medicine;  and documenting the encounter. Risk reduction counseling performed per USPSTF guidelines to reduce obesity and cardiovascular risk factors.     Please refer to Patient Instructions for Blood Glucose Monitoring and Insulin/Medications Dosing Guide"  in media tab for additional information. Please  also refer to " Patient Self Inventory" in the Media  tab for reviewed elements of pertinent patient history.  Leslie Lyons participated in the discussions, expressed understanding, and voiced agreement with the above plans.  All questions were answered to her satisfaction. she is encouraged to contact clinic should she have any questions or concerns prior to her return visit.  Follow up plan: - Return in about 2 months (around 08/09/2022) for Bring meter and logs, Diabetes F/U with A1c in office.   Rayetta Pigg, [facility] [address] [place], [place] [address] Phone: [phone] Fax: [phone]  06/08/2022, 4:22 PM

## 2022-06-29 ENCOUNTER — Ambulatory Visit: Payer: Medicare Other | Admitting: Urology

## 2022-07-01 ENCOUNTER — Ambulatory Visit: Payer: Medicare Other | Admitting: Gastroenterology

## 2022-07-23 ENCOUNTER — Encounter: Payer: Self-pay | Admitting: Family Medicine

## 2022-07-23 ENCOUNTER — Ambulatory Visit (INDEPENDENT_AMBULATORY_CARE_PROVIDER_SITE_OTHER): Payer: Medicare Other | Admitting: Family Medicine

## 2022-07-23 DIAGNOSIS — R197 Diarrhea, unspecified: Secondary | ICD-10-CM | POA: Diagnosis not present

## 2022-07-23 MED ORDER — DIPHENOXYLATE-ATROPINE 2.5-0.025 MG PO TABS: ORAL_TABLET | ORAL | 0 refills | Status: DC

## 2022-07-23 NOTE — Progress Notes (Signed)
Virtual Visit via Telephone Note  I connected with Leslie Lyons on 07/23/22 at 10:00 AM EDT by telephone and verified that I am speaking with the correct person using two identifiers.  Location: Patient: home Provider: office   I discussed the limitations, risks, security and privacy concerns of performing an evaluation and management service by telephone and the availability of in person appointments. I also discussed with the patient that there may be a patient responsible charge related to this service. The patient expressed understanding and agreed to proceed.   History of Present Illness: 1 day of diarrhea , 5 to 6 this morning, watery, no blood or mucus, no fever, chills , nausea or vomit   Observations/Objective:  There were no vitals taken for this visit. Good communication with no confusion and intact memory. Alert and oriented x 3 No signs of respiratory distress during speech  Assessment and Plan: Diarrhea Lomotil prescribed, limited BRAT diet discussed and the ned to stay well hydrated Call if worsen   Follow Up Instructions:    I discussed the assessment and treatment plan with the patient. The patient was provided an opportunity to ask questions and all were answered. The patient agreed with the plan and demonstrated an understanding of the instructions.   The patient was advised to call back or seek an in-person evaluation if the symptoms worsen or if the condition fails to improve as anticipated.  I provided 7 minutes of non-face-to-face time during this encounter.   Tula Nakayama, MD

## 2022-07-23 NOTE — Patient Instructions (Addendum)
Annual exam in office with MD mid October, call if you need me sooner. F;lu vaccine at visit  Medication is prescribed for diarrhea  Please ensure that you keep well hydrated  Thanks for choosing [facility], we consider it a privelige to serve you.'

## 2022-07-27 ENCOUNTER — Ambulatory Visit: Payer: Medicare Other | Admitting: Urology

## 2022-08-02 NOTE — Assessment & Plan Note (Signed)
Lomotil prescribed, limited BRAT diet discussed and the ned to stay well hydrated Call if worsen

## 2022-08-05 ENCOUNTER — Telehealth: Payer: Self-pay | Admitting: *Deleted

## 2022-08-05 ENCOUNTER — Encounter: Payer: Self-pay | Admitting: Internal Medicine

## 2022-08-05 ENCOUNTER — Encounter: Payer: Self-pay | Admitting: *Deleted

## 2022-08-05 ENCOUNTER — Ambulatory Visit (INDEPENDENT_AMBULATORY_CARE_PROVIDER_SITE_OTHER): Payer: Medicare Other | Admitting: Internal Medicine

## 2022-08-05 ENCOUNTER — Other Ambulatory Visit: Payer: Self-pay | Admitting: *Deleted

## 2022-08-05 VITALS — BP 145/80 | HR 78 | Temp 97.1°F | Ht 63.0 in | Wt 237.4 lb

## 2022-08-05 DIAGNOSIS — R7989 Other specified abnormal findings of blood chemistry: Secondary | ICD-10-CM

## 2022-08-05 DIAGNOSIS — K7581 Nonalcoholic steatohepatitis (NASH): Secondary | ICD-10-CM | POA: Diagnosis not present

## 2022-08-05 DIAGNOSIS — Z1211 Encounter for screening for malignant neoplasm of colon: Secondary | ICD-10-CM

## 2022-08-05 MED ORDER — PEG 3350-KCL-NA BICARB-NACL 420 G PO SOLR
4000.0000 mL | Freq: Once | ORAL | 0 refills | Status: AC
Start: 2022-08-05 — End: 2022-08-05

## 2022-08-05 NOTE — Telephone Encounter (Signed)
PA: APPROVED Authorization #: [id number]  DOS: 09/20/22-11/28/22

## 2022-08-05 NOTE — Patient Instructions (Signed)
For your abnormal liver function testS and history of fatty liver disease, I am going to order a right upper quadrant ultrasound to further evaluate.  We will also schedule you for colonoscopy for colon cancer screening purposes.  Recommend 1-2# weight loss per week until ideal body weight through exercise & diet. Low fat/cholesterol diet.   Avoid sweets, sodas, fruit juices, sweetened beverages like tea, etc. Gradually increase exercise from 15 min daily up to 1 hr per day 5 days/week. Limit alcohol use.  Further recommendations to follow.  It was very nice meeting both you today.  Dr. Abbey Chatters  Nonalcoholic Fatty Liver Disease Diet, Adult Nonalcoholic fatty liver disease is a condition that causes fat to build up in and around the liver. The disease makes it harder for the liver to work the way that it should. Following a healthy diet can help to keep nonalcoholic fatty liver disease under control. It can also help to prevent or improve conditions that are associated with the disease, such as heart disease, diabetes, high blood pressure, and abnormal cholesterol levels. Along with regular exercise, this diet: Promotes weight loss. Helps to control blood sugar levels. Helps to improve the way that the body uses insulin. What are tips for following this plan? Reading food labels  Always check food labels for: The amount of saturated fat in a food. You should limit your intake of saturated fat. Saturated fat is found in foods that come from animals, including meat and dairy products such as butter, cheese, and whole milk. The amount of fiber in a food. You should choose high-fiber foods such as fruits, vegetables, and whole grains. Try to get 25-30 grams (g) of fiber a day.   Cooking When cooking, use heart-healthy oils that are high in monounsaturated fats. These include olive oil, canola oil, and avocado oil. Limit frying or deep-frying foods. Cook foods using healthy methods such as  baking, boiling, steaming, and grilling instead. Meal planning You may want to keep track of how many calories you take in. Eating the right amount of calories will help you achieve a healthy weight. Meeting with a registered dietitian can help you get started. Limit how often you eat takeout and fast food. These foods are usually very high in fat, salt, and sugar. Use the glycemic index (GI) to plan your meals. The index tells you how quickly a food will raise your blood sugar. Choose low-GI foods (GI less than 55). These foods take a longer time to raise blood sugar. A registered dietitian can help you identify foods lower on the GI scale. Lifestyle You may want to follow a Mediterranean diet. This diet includes a lot of vegetables, lean meats or fish, whole grains, fruits, and healthy oils and fats. What foods can I eat?    Fruits Bananas. Apples. Oranges. Grapes. Papaya. Mango. Pomegranate. Kiwi. Grapefruit. Cherries. Vegetables Lettuce. Spinach. Peas. Beets. Cauliflower. Cabbage. Broccoli. Carrots. Tomatoes. Squash. Eggplant. Herbs. Peppers. Onions. Cucumbers. Brussels sprouts. Yams and sweet potatoes. Beans. Lentils. Grains Whole wheat or whole-grain foods, including breads, crackers, cereals, and pasta. Stone-ground whole wheat. Unsweetened oatmeal. Bulgur. Barley. Quinoa. Brown or wild rice. Corn or whole wheat flour tortillas. Meats and other proteins Lean meats. Poultry. Tofu. Seafood and shellfish. Dairy Low-fat or fat-free dairy products, such as yogurt, cottage cheese, or cheese. Beverages Water. Sugar-free drinks. Tea. Coffee. Low-fat or skim milk. Milk alternatives, such as soy or almond milk. Real fruit juice. Fats and oils Avocado. Canola or olive oil. Nuts and  nut butters. Seeds. Seasonings and condiments Mustard. Relish. Low-fat, low-sugar ketchup and barbecue sauce. Low-fat or fat-free mayonnaise. Sweets and desserts Sugar-free sweets. The items listed above may not be  a complete list of foods and beverages you can eat. Contact a dietitian for more information. What foods should I limit or avoid? Meats and other proteins Limit red meat to 1-2 times a week. Dairy [employer]. Fats and oils Palm oil and coconut oil. Fried foods. Other foods Processed foods. Foods that contain a lot of salt or sodium. Sweets and desserts Sweets that contain sugar. Beverages Sweetened drinks, such as sweet tea, milkshakes, iced sweet drinks, and sodas. Alcohol. The items listed above may not be a complete list of foods and beverages you should avoid. Contact a dietitian for more information. Where to find more information The [employer] of Diabetes and Digestive and Kidney Diseases: [employer] Summary Nonalcoholic fatty liver disease is a condition that causes fat to build up in and around the liver. Following a healthy diet can help to keep nonalcoholic fatty liver disease under control. Your diet should be rich in fruits, vegetables, whole grains, and lean proteins. Limit your intake of saturated fat. Saturated fat is found in foods that come from animals, including meat and dairy products such as butter, cheese, and whole milk. This diet promotes weight loss, helps to control blood sugar levels, and helps to improve the way that the body uses insulin. This information is not intended to replace advice given to you by your health care provider. Make sure you discuss any questions you have with your health care provider. Document Revised: 03/09/2019 Document Reviewed: 12/07/2018 Elsevier Patient Education  [place].

## 2022-08-05 NOTE — Progress Notes (Signed)
Referring Provider: [name], MD Primary Care Physician:  [name], MD Primary GI:  Dr. Abbey Chatters  Chief Complaint  Patient presents with   Follow-up    Bowels are back to normal. Doing well.     HPI:   Leslie Lyons is a 61 y.o. female who presents to the clinic today by referral from her PCP Dr. Moshe Cipro.  Last seen in our clinic just under 3 years ago.  History of nonalcoholic fatty liver disease.  Risk factors including obesity, diabetes, dyslipidemia.  Most recent blood work show abnormal aminotransferases with AST 87, ALT 55, normal T. bili, normal alk phos.  Denies any alcohol use.  No family history of liver disease.  No herbal supplements.  Last colonoscopy 2010 unremarkable.  States she was awake during the procedure.  Is hesitant to undergo another colonoscopy at this time.  No family history of colorectal malignancy.  No abdominal pain.  No melena hematochezia.  Does note new onset diarrhea for the last 1 to 2 weeks.  Currently taking Lomotil which is helping.  Typically her bowels move well.  Past Medical History:  Diagnosis Date   Abnormal transaminases    Anxiety    Bronchial asthma    with acute exacerbation   Cancer ([place]) 1980   cervical, diag  at age 64   Depression    Diabetes ([place])    Diabetes mellitus, type II ([place])    Headache(784.0)    with reduced vision in right eye for one month    Hyperlipidemia    Hypertension    Obesity     Past Surgical History:  Procedure Laterality Date   ABDOMINAL HYSTERECTOMY     COLONOSCOPY WITH ESOPHAGOGASTRODUODENOSCOPY (EGD)  11/2008   Dr. Arnoldo Morale: Conscious sedation.  Hiatal hernia.  Hemorrhoids. PATIENT REPORTS BEING AWAKE DURING PROCEDURE   CRYOTHERAPY     for abnormal pap   HEMORRHOID SURGERY     dr. Irving Shows   PARTIAL HYSTERECTOMY      Current Outpatient Medications  Medication Sig Dispense Refill   albuterol (PROAIR HFA) 108 (90 Base) MCG/ACT inhaler Inhale 2 puffs into the  lungs every 6 (six) hours as needed. 18 g 3   blood glucose meter kit and supplies Dispense based on patient and insurance preference. Test blood sugar once daily before breakfast. (FOR ICD-10 E10.9, E11.9). 1 each 0   Blood Glucose Monitoring Suppl (ONETOUCH VERIO FLEX SYSTEM) w/Device KIT Use to monitor glucose twice daily 1 kit 0   diphenoxylate-atropine (LOMOTIL) 2.5-0.025 MG tablet Take one tablet by mouth three times daily , as needed, for loose stool 12 tablet 0   ezetimibe (ZETIA) 10 MG tablet Take 10 mg by mouth daily.     glipiZIDE (GLUCOTROL XL) 5 MG 24 hr tablet Take 1 tablet (5 mg total) by mouth daily with breakfast. 90 tablet 3   glucose blood (ONETOUCH VERIO) test strip Use as instructed to measure glucose twice daily 100 each 12   insulin degludec (TRESIBA FLEXTOUCH) 100 UNIT/ML FlexTouch Pen Inject 25 Units into the skin at bedtime. 18 mL 3   Insulin Pen Needle (PEN NEEDLES) 32G X 4 MM MISC Use to inject insulin once daily 100 each 3   Lancets (ONETOUCH ULTRASOFT) lancets Use as instructed to monitor glucose twice daily 100 each 12   losartan (COZAAR) 50 MG tablet Take 1 tablet (50 mg total) by mouth daily. 90 tablet 2   mirabegron ER (MYRBETRIQ) 25 MG TB24  tablet Take 1 tablet (25 mg total) by mouth daily. 30 tablet 5   mometasone-formoterol (DULERA) 200-5 MCG/ACT AERO Inhale 2 puffs into the lungs 2 (two) times daily. 13 g 2   No current facility-administered medications for this visit.    Allergies as of 08/05/2022 - Review Complete 08/05/2022  Allergen Reaction Noted   Janumet [sitagliptin-metformin hcl]  10/03/2018   Robaxin [methocarbamol] Swelling 07/31/2013    Family History  Problem Relation Age of Onset   Cancer Mother        lung    COPD Father        emphysema   Hypertension Father    Cancer Maternal Aunt        breast   Cancer Paternal Uncle    Colon cancer Neg Hx     Social History   Socioeconomic History   Marital status: Married    Spouse  name: Not on file   Number of children: 2   Years of education: Not on file   Highest education level: Not on file  Occupational History   Occupation: homemaker   Tobacco Use   Smoking status: Former    Packs/day: 0.25    Years: 2.00    Total pack years: 0.50    Types: Cigarettes    Quit date: 12/04/2015    Years since quitting: 6.6   Smokeless tobacco: Never  Vaping Use   Vaping Use: Never used  Substance and Sexual Activity   Alcohol use: No   Drug use: No   Sexual activity: Yes  Other Topics Concern   Not on file  Social History Narrative   Lives at home with husband    Social Determinants of Health   Financial Resource Strain: Low Risk  (11/13/2021)   Overall Financial Resource Strain (CARDIA)    Difficulty of Paying Living Expenses: Not hard at all  Food Insecurity: No Food Insecurity (11/13/2021)   Hunger Vital Sign    Worried About Running Out of Food in the Last Year: Never true    Ran Out of Food in the Last Year: Never true  Transportation Needs: No Transportation Needs (11/13/2021)   PRAPARE - Hydrologist (Medical): No    Lack of Transportation (Non-Medical): No  Physical Activity: Inactive (11/13/2021)   Exercise Vital Sign    Days of Exercise per Week: 0 days    Minutes of Exercise per Session: 0 min  Stress: No Stress Concern Present (11/13/2021)   [place]    Feeling of Stress : Not at all  Social Connections: Moderately Isolated (11/13/2021)   Social Connection and Isolation Panel [NHANES]    Frequency of Communication with Friends and Family: Twice a week    Frequency of Social Gatherings with Friends and Family: Twice a week    Attends Religious Services: Never    Marine scientist or Organizations: No    Attends Music therapist: Never    Marital Status: Married    Subjective: Review of Systems  Constitutional:  Negative for  chills and fever.  HENT:  Negative for congestion and hearing loss.   Eyes:  Negative for blurred vision and double vision.  Respiratory:  Negative for cough and shortness of breath.   Cardiovascular:  Negative for chest pain and palpitations.  Gastrointestinal:  Positive for diarrhea. Negative for abdominal pain, blood in stool, constipation, heartburn, melena and vomiting.  Genitourinary:  Negative for  dysuria and urgency.  Musculoskeletal:  Negative for joint pain and myalgias.  Skin:  Negative for itching and rash.  Neurological:  Negative for dizziness and headaches.  Psychiatric/Behavioral:  Negative for depression. The patient is not nervous/anxious.      Objective: BP (!) 145/80 (BP Location: Left Arm, Patient Position: Sitting, Cuff Size: Large)   Pulse 78   Temp (!) 97.1 F (36.2 C) (Temporal)   Ht 5' 3"  (1.6 m)   Wt 237 lb 6.4 oz (107.7 kg)   SpO2 96%   BMI 42.05 kg/m  Physical Exam Constitutional:      Appearance: Normal appearance.  HENT:     Head: Normocephalic and atraumatic.  Eyes:     Extraocular Movements: Extraocular movements intact.     Conjunctiva/sclera: Conjunctivae normal.  Cardiovascular:     Rate and Rhythm: Normal rate and regular rhythm.  Pulmonary:     Effort: Pulmonary effort is normal.     Breath sounds: Normal breath sounds.  Abdominal:     General: Bowel sounds are normal.     Palpations: Abdomen is soft.  Musculoskeletal:        General: No swelling. Normal range of motion.     Cervical back: Normal range of motion and neck supple.  Skin:    General: Skin is warm and dry.     Coloration: Skin is not jaundiced.  Neurological:     General: No focal deficit present.     Mental Status: She is alert and oriented to person, place, and time.  Psychiatric:        Mood and Affect: Mood normal.        Behavior: Behavior normal.      Assessment: *Abnormal LFTs *Nonalcoholic steatohepatitis *Colon cancer screening   Plan: Discussed  fatty liver in depth with patient today.  Her risk factors include obesity, diabetes, dyslipidemia.  Recommend 1-2# weight loss per week until ideal body weight through exercise & diet. Low fat/cholesterol diet.   Avoid sweets, sodas, fruit juices, sweetened beverages like tea, etc. Gradually increase exercise from 15 min daily up to 1 hr per day 5 days/week. Limit alcohol use.  We will update right upper quadrant ultrasound today as well as elastography.  Will schedule for screening colonoscopy.The risks including infection, bleed, or perforation as well as benefits, limitations, alternatives and imponderables have been reviewed with the patient. Questions have been answered. All parties agreeable.   Follow-up after colonoscopy.  08/05/2022 3:41 PM   Disclaimer: This note was dictated with voice recognition software. Similar sounding words can inadvertently be transcribed and may not be corrected upon review.

## 2022-08-06 ENCOUNTER — Encounter: Payer: Self-pay | Admitting: *Deleted

## 2022-08-13 ENCOUNTER — Ambulatory Visit (HOSPITAL_COMMUNITY)
Admission: RE | Admit: 2022-08-13 | Discharge: 2022-08-13 | Disposition: A | Discharge status: Home / Self Care | Payer: Medicare Other | Source: Ambulatory Visit | Attending: Internal Medicine | Admitting: Internal Medicine

## 2022-08-13 DIAGNOSIS — R7989 Other specified abnormal findings of blood chemistry: Secondary | ICD-10-CM | POA: Diagnosis not present

## 2022-08-17 ENCOUNTER — Ambulatory Visit: Payer: Medicare Other | Admitting: Nurse Practitioner

## 2022-08-17 ENCOUNTER — Encounter: Payer: Self-pay | Admitting: Nurse Practitioner

## 2022-08-17 VITALS — BP 143/84 | HR 79 | Ht 63.0 in | Wt 236.8 lb

## 2022-08-17 DIAGNOSIS — I10 Essential (primary) hypertension: Secondary | ICD-10-CM

## 2022-08-17 DIAGNOSIS — E782 Mixed hyperlipidemia: Secondary | ICD-10-CM

## 2022-08-17 DIAGNOSIS — E1165 Type 2 diabetes mellitus with hyperglycemia: Secondary | ICD-10-CM | POA: Diagnosis not present

## 2022-08-17 MED ORDER — PEN NEEDLES 32G X 4 MM MISC: 3 refills | Status: DC

## 2022-08-17 MED ORDER — TRESIBA FLEXTOUCH 100 UNIT/ML ~~LOC~~ SOPN
24.0000 [IU] | PEN_INJECTOR | Freq: Every day | SUBCUTANEOUS | 3 refills | Status: DC
Start: 2022-08-17 — End: 2023-05-04

## 2022-08-17 NOTE — Progress Notes (Signed)
Endocrinology Follow Up Note       08/17/2022, 5:24 PM   Subjective:    Patient ID: Leslie Lyons, female    DOB: 05/27/1961.  Leslie Lyons is being seen in follow up after being seen in consultation for management of currently uncontrolled symptomatic diabetes requested by  [name], MD.   Past Medical History:  Diagnosis Date   Abnormal transaminases    Anxiety    Bronchial asthma    with acute exacerbation   Cancer ([place]) 1980   cervical, diag  at age 79   Depression    Diabetes ([place])    Diabetes mellitus, type II ([place])    Headache(784.0)    with reduced vision in right eye for one month    Hyperlipidemia    Hypertension    Obesity     Past Surgical History:  Procedure Laterality Date   ABDOMINAL HYSTERECTOMY     COLONOSCOPY WITH ESOPHAGOGASTRODUODENOSCOPY (EGD)  11/2008   Dr. Arnoldo Morale: Conscious sedation.  Hiatal hernia.  Hemorrhoids. PATIENT REPORTS BEING AWAKE DURING PROCEDURE   CRYOTHERAPY     for abnormal pap   HEMORRHOID SURGERY     dr. Irving Shows   PARTIAL HYSTERECTOMY      Social History   Socioeconomic History   Marital status: Married    Spouse name: Not on file   Number of children: 2   Years of education: Not on file   Highest education level: Not on file  Occupational History   Occupation: homemaker   Tobacco Use   Smoking status: Former    Packs/day: 0.25    Years: 2.00    Total pack years: 0.50    Types: Cigarettes    Quit date: 12/04/2015    Years since quitting: 6.7   Smokeless tobacco: Never  Vaping Use   Vaping Use: Never used  Substance and Sexual Activity   Alcohol use: No   Drug use: No   Sexual activity: Yes  Other Topics Concern   Not on file  Social History Narrative   Lives at home with husband    Social Determinants of Health   Financial Resource Strain: Low Risk  (11/13/2021)   Overall Financial Resource Strain (CARDIA)     Difficulty of Paying Living Expenses: Not hard at all  Food Insecurity: No Food Insecurity (11/13/2021)   Hunger Vital Sign    Worried About Running Out of Food in the Last Year: Never true    Ran Out of Food in the Last Year: Never true  Transportation Needs: No Transportation Needs (11/13/2021)   PRAPARE - Hydrologist (Medical): No    Lack of Transportation (Non-Medical): No  Physical Activity: Inactive (11/13/2021)   Exercise Vital Sign    Days of Exercise per Week: 0 days    Minutes of Exercise per Session: 0 min  Stress: No Stress Concern Present (11/13/2021)   [place]    Feeling of Stress : Not at all  Social Connections: Moderately Isolated (11/13/2021)   Social Connection and Isolation Panel [NHANES]    Frequency of Communication with Friends and Family:  Twice a week    Frequency of Social Gatherings with Friends and Family: Twice a week    Attends Religious Services: Never    Marine scientist or Organizations: No    Attends Music therapist: Never    Marital Status: Married    Family History  Problem Relation Age of Onset   Cancer Mother        lung    COPD Father        emphysema   Hypertension Father    Cancer Maternal Aunt        breast   Cancer Paternal Uncle    Colon cancer Neg Hx     Outpatient Encounter Medications as of 08/17/2022  Medication Sig   albuterol (PROAIR HFA) 108 (90 Base) MCG/ACT inhaler Inhale 2 puffs into the lungs every 6 (six) hours as needed.   blood glucose meter kit and supplies Dispense based on patient and insurance preference. Test blood sugar once daily before breakfast. (FOR ICD-10 E10.9, E11.9).   Blood Glucose Monitoring Suppl ([place]) w/Device KIT Use to monitor glucose twice daily   diphenoxylate-atropine (LOMOTIL) 2.5-0.025 MG tablet Take one tablet by mouth three times daily , as needed, for  loose stool   ezetimibe (ZETIA) 10 MG tablet Take 10 mg by mouth daily.   glipiZIDE (GLUCOTROL XL) 5 MG 24 hr tablet Take 1 tablet (5 mg total) by mouth daily with breakfast.   glucose blood (ONETOUCH VERIO) test strip Use as instructed to measure glucose twice daily   Lancets (ONETOUCH ULTRASOFT) lancets Use as instructed to monitor glucose twice daily   losartan (COZAAR) 50 MG tablet Take 1 tablet (50 mg total) by mouth daily.   mirabegron ER (MYRBETRIQ) 25 MG TB24 tablet Take 1 tablet (25 mg total) by mouth daily.   mometasone-formoterol (DULERA) 200-5 MCG/ACT AERO Inhale 2 puffs into the lungs 2 (two) times daily.   [DISCONTINUED] insulin degludec (TRESIBA FLEXTOUCH) 100 UNIT/ML FlexTouch Pen Inject 25 Units into the skin at bedtime.   [DISCONTINUED] Insulin Pen Needle (PEN NEEDLES) 32G X 4 MM MISC Use to inject insulin once daily   insulin degludec (TRESIBA FLEXTOUCH) 100 UNIT/ML FlexTouch Pen Inject 24 Units into the skin at bedtime.   Insulin Pen Needle (PEN NEEDLES) 32G X 4 MM MISC Use to inject insulin once daily   No facility-administered encounter medications on file as of 08/17/2022.    ALLERGIES: Allergies  Allergen Reactions   Janumet [Sitagliptin-Metformin Hcl]     States she vomits up the whole pill after taking this   Robaxin [Methocarbamol] Swelling    VACCINATION STATUS: Immunization History  Administered Date(s) Administered   Influenza Whole 09/25/2007, 11/04/2009, 09/22/2010   Influenza,inj,Quad PF,6+ Mos 08/09/2013, 07/22/2014, 11/17/2015, 01/04/2018, 10/03/2018, 08/28/2019, 08/05/2020   Janssen (J&J) SARS-COV-2 Vaccination 05/20/2020   Pneumococcal Conjugate-13 04/25/2018   Pneumococcal Polysaccharide-23 07/31/2009, 07/05/2016   Td 03/26/2009    Diabetes She presents for her follow-up diabetic visit. She has type 2 diabetes mellitus. Onset time: Diagnosed at approx age of 31. Her disease course has been improving. There are no hypoglycemic associated  symptoms. There are no diabetic associated symptoms. There are no hypoglycemic complications. Diabetic complications include peripheral neuropathy. Risk factors for coronary artery disease include diabetes mellitus, dyslipidemia, family history, obesity, hypertension, sedentary lifestyle and post-menopausal. Current diabetic treatment includes oral agent (monotherapy) and insulin injections. She is compliant with treatment most of the time. Her weight is fluctuating minimally. She is following a  generally healthy diet. When asked about meal planning, she reported none. She has had a previous visit with a dietitian (went to DM class previously). Her home blood glucose trend is decreasing steadily. Her breakfast blood glucose range is generally 140-180 mg/dl. Her bedtime blood glucose range is generally 140-180 mg/dl. Her overall blood glucose range is 140-180 mg/dl. (She presents today, accompanied by a family member, with her meter and logs showing stable, at target glycemic profile.  Her POCT A1c today is 7.4%, greatly improved from last visit of 12.4%.  Analysis of her meter shows 7-day average of 153, 14-day average of 152, 30-day average of 154.  She admits she still eats things she knows she shouldn't but is continuing to work on it.  She denies any significant hypoglycemia.) An ACE inhibitor/angiotensin II receptor blocker is being taken. She sees a podiatrist.Eye exam is not current.     Review of systems  Constitutional: + stable body weight, current Body mass index is 41.95 kg/m., + fatigue-improving, no subjective hyperthermia, no subjective hypothermia Eyes: + blurry vision, no xerophthalmia ENT: no sore throat, no nodules palpated in throat, no dysphagia/odynophagia, no hoarseness Cardiovascular: no chest pain, no shortness of breath, no palpitations, no leg swelling Respiratory: no cough, no shortness of breath Gastrointestinal: no nausea/vomiting/diarrhea Musculoskeletal: no muscle/joint  aches Skin: no rashes, no hyperemia Neurological: no tremors, no numbness, no tingling, no dizziness Psychiatric: no depression, no anxiety  Objective:     BP (!) 143/84 (BP Location: Left Arm, Patient Position: Sitting, Cuff Size: Large)   Pulse 79   Ht 5' 3"  (1.6 m)   Wt 236 lb 12.8 oz (107.4 kg)   BMI 41.95 kg/m   Wt Readings from Last 3 Encounters:  08/17/22 236 lb 12.8 oz (107.4 kg)  08/05/22 237 lb 6.4 oz (107.7 kg)  06/08/22 238 lb (108 kg)     BP Readings from Last 3 Encounters:  08/17/22 (!) 143/84  08/05/22 (!) 145/80  06/08/22 125/84      Physical Exam- Limited  Constitutional:  Body mass index is 41.95 kg/m. , not in acute distress, normal state of mind Eyes:  EOMI, no exophthalmos Neck: Supple Cardiovascular: RRR, no murmurs, rubs, or gallops, no edema Respiratory: Adequate breathing efforts, no crackles, rales, rhonchi, or wheezing Musculoskeletal: no gross deformities, strength intact in all four extremities, no gross restriction of joint movements Skin:  no rashes, no hyperemia Neurological: no tremor with outstretched hands  Diabetic Foot Exam - Simple   Simple Foot Form Visual Inspection No deformities, no ulcerations, no other skin breakdown bilaterally: Yes Sensation Testing Intact to touch and monofilament testing bilaterally: Yes Pulse Check Posterior Tibialis and Dorsalis pulse intact bilaterally: Yes Comments     CMP ( most recent) CMP     Component Value Date/Time   NA 135 04/22/2022 1044   K 3.7 04/22/2022 1044   CL 93 (L) 04/22/2022 1044   CO2 24 04/22/2022 1044   GLUCOSE 328 (H) 04/22/2022 1044   GLUCOSE 144 (H) 03/09/2021 0944   BUN 9 04/22/2022 1044   CREATININE 0.96 04/22/2022 1044   CREATININE 1.56 (H) 01/07/2020 0913   CALCIUM 10.1 04/22/2022 1044   PROT 8.3 04/22/2022 1044   ALBUMIN 4.8 04/22/2022 1044   AST 87 (H) 04/22/2022 1044   ALT 55 (H) 04/22/2022 1044   ALKPHOS 115 04/22/2022 1044   BILITOT 0.3 04/22/2022  1044   GFRNONAA >60 03/09/2021 0944   GFRNONAA 69 07/13/2019 0816   GFRAA >60 08/05/2020  1107   GFRAA 80 07/13/2019 0816     Diabetic Labs (most recent): Lab Results  Component Value Date   HGBA1C 12.4 (A) 04/22/2022   HGBA1C 6.9 (H) 06/30/2021   HGBA1C 6.1 10/30/2020   MICROALBUR 22.2 (H) 08/05/2020   MICROALBUR 8.6 03/27/2019   MICROALBUR 1.9 01/06/2018     Lipid Panel ( most recent) Lipid Panel     Component Value Date/Time   CHOL 150 04/22/2022 1044   TRIG 129 04/22/2022 1044   HDL 55 04/22/2022 1044   CHOLHDL 2.7 04/22/2022 1044   CHOLHDL 2.9 08/05/2020 1107   VLDL 25 08/05/2020 1107   LDLCALC 72 04/22/2022 1044   LDLCALC 169 (H) 07/13/2019 0816   LABVLDL 23 04/22/2022 1044      Lab Results  Component Value Date   TSH 1.780 04/22/2022   TSH 2.630 06/30/2021   TSH 1.426 08/05/2020   TSH 0.869 04/22/2020   TSH 3.56 03/27/2019   TSH 1.80 01/04/2018   TSH 2.75 05/12/2016   TSH 1.915 07/05/2013   TSH 0.829 06/25/2011   TSH 1.159 09/28/2010        Assessment & Plan:   1) Type 2 diabetes mellitus with hyperglycemia, without long-term current use of insulin ([place])  She presents today, accompanied by a family member, with her meter and logs showing stable, at target glycemic profile.  Her POCT A1c today is 7.4%, greatly improved from last visit of 12.4%.  Analysis of her meter shows 7-day average of 153, 14-day average of 152, 30-day average of 154.  She admits she still eats things she knows she shouldn't but is continuing to work on it.  She denies any significant hypoglycemia.  - Leslie Lyons has currently uncontrolled symptomatic type 2 DM since 61 years of age.   -Recent labs reviewed.  - I had a long discussion with her about the progressive nature of diabetes and the pathology behind its complications. -her diabetes is complicated by neuropathy and she remains at a high risk for more acute and chronic complications which include CAD, CVA, CKD,  retinopathy, and neuropathy. These are all discussed in detail with her.  The following Lifestyle Medicine recommendations according to [facility]) were discussed and offered to patient and she agrees to start the journey:  A. Whole Foods, Plant-based plate comprising of fruits and vegetables, plant-based proteins, whole-grain carbohydrates was discussed in detail with the patient.   A list for source of those nutrients were also provided to the patient.  Patient will use only water or unsweetened tea for hydration. B.  The need to stay away from risky substances including alcohol, smoking; obtaining 7 to 9 hours of restorative sleep, at least 150 minutes of moderate intensity exercise weekly, the importance of healthy social connections,  and stress reduction techniques were discussed. C.  A full color page of  Calorie density of various food groups per pound showing examples of each food groups was provided to the patient.  - Nutritional counseling repeated at each appointment due to patients tendency to fall back in to old habits.  - The patient admits there is a room for improvement in their diet and drink choices. -  Suggestion is made for the patient to avoid simple carbohydrates from their diet including Cakes, Sweet Desserts / Pastries, Ice Cream, Soda (diet and regular), Sweet Tea, Candies, Chips, Cookies, Sweet Pastries, Store Bought Juices, Alcohol in Excess of 1-2 drinks a day, Artificial Sweeteners, Coffee Creamer, and "  Sugar-free" Products. This will help patient to have stable blood glucose profile and potentially avoid unintended weight gain.   - I encouraged the patient to switch to unprocessed or minimally processed complex starch and increased protein intake (animal or plant source), fruits, and vegetables.   - Patient is advised to stick to a routine mealtimes to eat 3 meals a day and avoid unnecessary snacks (to snack only to correct  hypoglycemia).  - I have approached her with the following individualized plan to manage her diabetes and patient agrees:   -Given her improved glycemic profile, she is advised to continue Tresiba 25 units SQ nightly and Glipizide 5 mg XL daily with breakfast.  Her ultimate goal is to stop the insulin in the future.  I did give her sample pens of Tyler Aas today to help her conserve costs.  -she is encouraged to continue monitoring blood glucose twice daily, before breakfast and before bed, and to call the clinic if he has readings less than 70 or above 300 for 3 tests in a row.   - Adjustment parameters are given to her for hypo and hyperglycemia in writing.  -She did not tolerate Janumet in the past (severe nausea with vomiting).  - she will be considered for incretin therapy as appropriate next visit (concerned with cost-therefore will hold off on this for now).  - Specific targets for  A1c; LDL, HDL, and Triglycerides were discussed with the patient.  2) Blood Pressure /Hypertension:  her blood pressure is controlled to target.   she is advised to continue her current medications including Losartan 50 mg p.o. daily with breakfast.  3) Lipids/Hyperlipidemia:    Review of her recent lipid panel from 04/22/22 showed controlled LDL at 72 .  She is not currently on any lipid lowering medications.   4)  Weight/Diet:  her Body mass index is 41.95 kg/m.  -  clearly complicating her diabetes care.   she is a candidate for weight loss. I discussed with her the fact that loss of 5 - 10% of her  current body weight will have the most impact on her diabetes management.  Exercise, and detailed carbohydrates information provided  -  detailed on discharge instructions.  5) Chronic Care/Health Maintenance: -she is on ACEI/ARB and not on Statin medications and is encouraged to initiate and continue to follow up with Ophthalmology, Dentist, Podiatrist at least yearly or according to recommendations, and  advised to stay away from smoking. I have recommended yearly flu vaccine and pneumonia vaccine at least every 5 years; moderate intensity exercise for up to 150 minutes weekly; and sleep for at least 7 hours a day.  - she is advised to maintain close follow up with [name], MD for primary care needs, as well as her other providers for optimal and coordinated care.     I spent 38 minutes in the care of the patient today including review of labs from [place], Lipids, Thyroid Function, Hematology (current and previous including abstractions from other facilities); face-to-face time discussing  her blood glucose readings/logs, discussing hypoglycemia and hyperglycemia episodes and symptoms, medications doses, her options of short and long term treatment based on the latest standards of care / guidelines;  discussion about incorporating lifestyle medicine;  and documenting the encounter. Risk reduction counseling performed per USPSTF guidelines to reduce obesity and cardiovascular risk factors.     Please refer to Patient Instructions for Blood Glucose Monitoring and Insulin/Medications Dosing Guide"  in media tab for additional  information. Please  also refer to " Patient Self Inventory" in the Media  tab for reviewed elements of pertinent patient history.  Sharion Balloon participated in the discussions, expressed understanding, and voiced agreement with the above plans.  All questions were answered to her satisfaction. she is encouraged to contact clinic should she have any questions or concerns prior to her return visit.     Follow up plan: - Return in about 4 months (around 12/17/2022) for Diabetes F/U with A1c in office, No previsit labs, Bring meter and logs.   Rayetta Pigg, [facility] [address] [place], [place] [address] Phone: [phone] Fax: [phone]  08/17/2022, 5:24 PM

## 2022-08-31 ENCOUNTER — Ambulatory Visit: Payer: Medicare Other | Admitting: Urology

## 2022-09-07 ENCOUNTER — Encounter: Payer: Medicare Other | Admitting: Family Medicine

## 2022-09-07 ENCOUNTER — Other Ambulatory Visit: Payer: Self-pay

## 2022-09-07 ENCOUNTER — Telehealth: Payer: Self-pay | Admitting: Family Medicine

## 2022-09-07 NOTE — Telephone Encounter (Signed)
Patient called in regard to BP med   Is unclear on what meds have been dc for BP  and what meds she needs to take. Patient wants a call back.

## 2022-09-07 NOTE — Telephone Encounter (Signed)
Patient aware no changes in medications as of today follow up scheduled for 09/15/22 patient is aware to bring all medications to that visit.

## 2022-09-10 ENCOUNTER — Encounter: Payer: Self-pay | Admitting: *Deleted

## 2022-09-14 ENCOUNTER — Encounter: Payer: Self-pay | Admitting: *Deleted

## 2022-09-15 ENCOUNTER — Encounter: Payer: Self-pay | Admitting: Family Medicine

## 2022-09-15 ENCOUNTER — Ambulatory Visit (INDEPENDENT_AMBULATORY_CARE_PROVIDER_SITE_OTHER): Payer: Medicare Other | Admitting: Family Medicine

## 2022-09-15 ENCOUNTER — Encounter (HOSPITAL_COMMUNITY): Payer: Medicare Other

## 2022-09-15 VITALS — BP 146/90 | HR 102 | Resp 16 | Ht 63.0 in | Wt 247.0 lb

## 2022-09-15 DIAGNOSIS — R32 Unspecified urinary incontinence: Secondary | ICD-10-CM | POA: Diagnosis not present

## 2022-09-15 DIAGNOSIS — Z23 Encounter for immunization: Secondary | ICD-10-CM | POA: Diagnosis not present

## 2022-09-15 DIAGNOSIS — H60391 Other infective otitis externa, right ear: Secondary | ICD-10-CM

## 2022-09-15 DIAGNOSIS — I10 Essential (primary) hypertension: Secondary | ICD-10-CM | POA: Diagnosis not present

## 2022-09-15 DIAGNOSIS — E1165 Type 2 diabetes mellitus with hyperglycemia: Secondary | ICD-10-CM

## 2022-09-15 MED ORDER — LOSARTAN POTASSIUM 100 MG PO TABS: 100.0000 mg | ORAL_TABLET | Freq: Every day | ORAL | 2 refills | Status: DC

## 2022-09-15 MED ORDER — CIPROFLOXACIN-DEXAMETHASONE 0.3-0.1 % OT SUSP: 4.0000 [drp] | Freq: Two times a day (BID) | OTIC | 0 refills | Status: DC

## 2022-09-15 MED ORDER — MIRABEGRON ER 50 MG PO TB24: 50.0000 mg | ORAL_TABLET | Freq: Every day | ORAL | 3 refills | Status: DC

## 2022-09-15 NOTE — Patient Instructions (Addendum)
Keep appointment for annual with pap in December  Flu vaccine today  Increase cozaar to 100 mg daily, new script is sent in  Congrats on improved blood sugar!!  Right outer ear canal is inflamed , ear drops are prescribed  Avoid tylenol please  Thanks for choosing [facility], we consider it a privelige to serve you.

## 2022-09-19 ENCOUNTER — Encounter: Payer: Self-pay | Admitting: Family Medicine

## 2022-09-19 DIAGNOSIS — H6091 Unspecified otitis externa, right ear: Secondary | ICD-10-CM | POA: Insufficient documentation

## 2022-09-19 NOTE — Assessment & Plan Note (Signed)
Not at goal though improved, inc cozaar to 100 mg daily with close follow up DASH diet and commitment to daily physical activity for a minimum of 30 minutes discussed and encouraged, as a part of hypertension management. The importance of attaining a healthy weight is also discussed.     09/15/2022    4:19 PM 09/15/2022    3:59 PM 08/17/2022    3:52 PM 08/05/2022    3:27 PM 06/08/2022    3:33 PM 05/18/2022    9:34 AM 05/07/2022   10:50 AM  BP/Weight  Systolic BP 579 728 206 015 615 379 432  Diastolic BP 90 94 84 80 84 68 91  Wt. (Lbs)  247 236.8 237.4 238 241 241  BMI  43.75 kg/m2 41.95 kg/m2 42.05 kg/m2 42.16 kg/m2 42.69 kg/m2 42.69 kg/m2

## 2022-09-19 NOTE — Assessment & Plan Note (Signed)
Improved and managed by Endo Leslie Lyons is reminded of the importance of commitment to daily physical activity for 30 minutes or more, as able and the need to limit carbohydrate intake to 30 to 60 grams per meal to help with blood sugar control.   The need to take medication as prescribed, test blood sugar as directed, and to call between visits if there is a concern that blood sugar is uncontrolled is also discussed.   Leslie Lyons is reminded of the importance of daily foot exam, annual eye examination, and good blood sugar, blood pressure and cholesterol control.     Latest Ref Rng & Units 04/22/2022   10:44 AM 04/22/2022   10:39 AM 06/30/2021   10:33 AM 03/09/2021    9:44 AM 10/30/2020   10:42 AM  Diabetic Labs  HbA1c 0.0 - 7.0 %  12.4  6.9   6.1   Chol 100 - 199 mg/dL 150   219     HDL >39 mg/dL 55   58     Calc LDL 0 - 99 mg/dL 72   113     Triglycerides 0 - 149 mg/dL 129   282     Creatinine 0.57 - 1.00 mg/dL 0.96   0.92  0.85        09/15/2022    4:19 PM 09/15/2022    3:59 PM 08/17/2022    3:52 PM 08/05/2022    3:27 PM 06/08/2022    3:33 PM 05/18/2022    9:34 AM 05/07/2022   10:50 AM  BP/Weight  Systolic BP 326 712 458 099 833 825 053  Diastolic BP 90 94 84 80 84 68 91  Wt. (Lbs)  247 236.8 237.4 238 241 241  BMI  43.75 kg/m2 41.95 kg/m2 42.05 kg/m2 42.16 kg/m2 42.69 kg/m2 42.69 kg/m2      08/05/2020    9:20 AM 08/28/2019    8:40 AM  Foot/eye exam completion dates  Foot Form Completion Done Done

## 2022-09-19 NOTE — Assessment & Plan Note (Signed)
  Patient re-educated about  the importance of commitment to a  minimum of 150 minutes of exercise per week as able.  The importance of healthy food choices with portion control discussed, as well as eating regularly and within a 12 hour window most days. The need to choose "clean , green" food 50 to 75% of the time is discussed, as well as to make water the primary drink and set a goal of 64 ounces water daily.       09/15/2022    3:59 PM 08/17/2022    3:52 PM 08/05/2022    3:27 PM  Weight /BMI  Weight 247 lb 236 lb 12.8 oz 237 lb 6.4 oz  Height 5' 3"  (1.6 m) 5' 3"  (1.6 m) 5' 3"  (1.6 m)  BMI 43.75 kg/m2 41.95 kg/m2 42.05 kg/m2

## 2022-09-19 NOTE — Assessment & Plan Note (Signed)
Topical antibiotic ear drop prescribed

## 2022-09-19 NOTE — Assessment & Plan Note (Signed)
Fair control on medication

## 2022-09-19 NOTE — Progress Notes (Signed)
Leslie Lyons     MRN: [id number]      DOB: July 12, 1961   HPI Leslie Lyons is here for follow up and re-evaluation of chronic medical conditions, medication management and review of any available recent lab and radiology data.  Preventive health is updated, specifically  Cancer screening and Immunization.   Questions or concerns regarding consultations or procedures which the PT has had in the interim are  addressed. The PT denies any adverse reactions to current medications since the last visit.  C/o right ear pain, no change in hearing Denies polyuria, polydipsia, blurred vision , or hypoglycemic episodes.   ROS Denies recent fever or chills. Denies sinus pressure, nasal congestion,  or sore throat. Denies chest congestion, productive cough or wheezing. Denies chest pains, palpitations and leg swelling Denies abdominal pain, nausea, vomiting,diarrhea or constipation.   Denies dysuria, frequency, hesitancy or incontinence. Denies uncontrolled joint pain, swelling and limitation in mobility. Denies headaches, seizures, numbness, or tingling. Chronic depression, anxiety and  insomnia. Denies skin break down or rash.   PE  BP (!) 146/90   Pulse (!) 102   Resp 16   Ht 5' 3"  (1.6 m)   Wt 247 lb (112 kg)   SpO2 93%   BMI 43.75 kg/m   Patient alert and oriented and in no cardiopulmonary distress.  HEENT: No facial asymmetry, EOMI,     Neck supple . Right external ear canal eryhtematous, both TM's clear Chest: Clear to auscultation bilaterally.  CVS: S1, S2 no murmurs, no S3.Regular rate.  ABD: Soft non tender.   Ext: No edema  MS: Adequate though reduced  ROM spine, shoulders, hips and knees.  Skin: Intact, no ulcerations or rash noted.  Psych: Good eye contact, normal affect. Memory intact not anxious or depressed appearing.  CNS: CN 2-12 intact, power,  normal throughout.no focal deficits noted.   Assessment & Plan  Essential hypertension, benign Not at  goal though improved, inc cozaar to 100 mg daily with close follow up DASH diet and commitment to daily physical activity for a minimum of 30 minutes discussed and encouraged, as a part of hypertension management. The importance of attaining a healthy weight is also discussed.     09/15/2022    4:19 PM 09/15/2022    3:59 PM 08/17/2022    3:52 PM 08/05/2022    3:27 PM 06/08/2022    3:33 PM 05/18/2022    9:34 AM 05/07/2022   10:50 AM  BP/Weight  Systolic BP 572 620 355 974 163 845 364  Diastolic BP 90 94 84 80 84 68 91  Wt. (Lbs)  247 236.8 237.4 238 241 241  BMI  43.75 kg/m2 41.95 kg/m2 42.05 kg/m2 42.16 kg/m2 42.69 kg/m2 42.69 kg/m2       Type 2 diabetes mellitus with hyperglycemia ([place]) Improved and managed by Endo Leslie Lyons is reminded of the importance of commitment to daily physical activity for 30 minutes or more, as able and the need to limit carbohydrate intake to 30 to 60 grams per meal to help with blood sugar control.   The need to take medication as prescribed, test blood sugar as directed, and to call between visits if there is a concern that blood sugar is uncontrolled is also discussed.   Leslie Lyons is reminded of the importance of daily foot exam, annual eye examination, and good blood sugar, blood pressure and cholesterol control.     Latest Ref Rng & Units 04/22/2022   10:44 AM  04/22/2022   10:39 AM 06/30/2021   10:33 AM 03/09/2021    9:44 AM 10/30/2020   10:42 AM  Diabetic Labs  HbA1c 0.0 - 7.0 %  12.4  6.9   6.1   Chol 100 - 199 mg/dL 150   219     HDL >39 mg/dL 55   58     Calc LDL 0 - 99 mg/dL 72   113     Triglycerides 0 - 149 mg/dL 129   282     Creatinine 0.57 - 1.00 mg/dL 0.96   0.92  0.85        09/15/2022    4:19 PM 09/15/2022    3:59 PM 08/17/2022    3:52 PM 08/05/2022    3:27 PM 06/08/2022    3:33 PM 05/18/2022    9:34 AM 05/07/2022   10:50 AM  BP/Weight  Systolic BP 409 811 914 782 956 213 086  Diastolic BP 90 94 84 80 84 68 91  Wt. (Lbs)  247  236.8 237.4 238 241 241  BMI  43.75 kg/m2 41.95 kg/m2 42.05 kg/m2 42.16 kg/m2 42.69 kg/m2 42.69 kg/m2      08/05/2020    9:20 AM 08/28/2019    8:40 AM  Foot/eye exam completion dates  Foot Form Completion Done Done        Urinary incontinence Fair control on medication  Morbid obesity ([place])  Patient re-educated about  the importance of commitment to a  minimum of 150 minutes of exercise per week as able.  The importance of healthy food choices with portion control discussed, as well as eating regularly and within a 12 hour window most days. The need to choose "clean , green" food 50 to 75% of the time is discussed, as well as to make water the primary drink and set a goal of 64 ounces water daily.       09/15/2022    3:59 PM 08/17/2022    3:52 PM 08/05/2022    3:27 PM  Weight /BMI  Weight 247 lb 236 lb 12.8 oz 237 lb 6.4 oz  Height 5' 3"  (1.6 m) 5' 3"  (1.6 m) 5' 3"  (1.6 m)  BMI 43.75 kg/m2 41.95 kg/m2 42.05 kg/m2      Right otitis externa Topical antibiotic ear drop prescribed

## 2022-09-21 ENCOUNTER — Ambulatory Visit (INDEPENDENT_AMBULATORY_CARE_PROVIDER_SITE_OTHER): Payer: Medicare Other | Admitting: Urology

## 2022-09-21 ENCOUNTER — Encounter: Payer: Self-pay | Admitting: Urology

## 2022-09-21 VITALS — BP 162/85 | HR 78 | Ht 63.0 in | Wt 240.0 lb

## 2022-09-21 DIAGNOSIS — N3281 Overactive bladder: Secondary | ICD-10-CM | POA: Diagnosis not present

## 2022-09-21 DIAGNOSIS — R350 Frequency of micturition: Secondary | ICD-10-CM

## 2022-09-21 DIAGNOSIS — R32 Unspecified urinary incontinence: Secondary | ICD-10-CM

## 2022-09-21 DIAGNOSIS — R3915 Urgency of urination: Secondary | ICD-10-CM | POA: Diagnosis not present

## 2022-09-21 NOTE — Progress Notes (Signed)
History of Present Illness: Leslie Lyons is a 61 y.o. year old female presenting for follow-up of overactive bladder with significant incontinence.  Last seen in June of this year.  She does have very limited mobility.  Her Myrbetriq was increased from 25 to 50 mg a day.  She was given samples of that.  It was recommended she follow-up in about 6 weeks.  10.24.2023: Here for recheck.  She is doing better on the 50 mg Myrbetriq.  Unfortunately, price went up to $100 a month.   Past Medical History:  Diagnosis Date   Abnormal transaminases    Anxiety    Bronchial asthma    with acute exacerbation   Cancer ([place]) 1980   cervical, diag  at age 71   Depression    Diabetes ([place])    Diabetes mellitus, type II ([place])    Headache(784.0)    with reduced vision in right eye for one month    Hyperlipidemia    Hypertension    Obesity     Past Surgical History:  Procedure Laterality Date   ABDOMINAL HYSTERECTOMY     COLONOSCOPY WITH ESOPHAGOGASTRODUODENOSCOPY (EGD)  11/2008   Dr. Arnoldo Morale: Conscious sedation.  Hiatal hernia.  Hemorrhoids. PATIENT REPORTS BEING AWAKE DURING PROCEDURE   CRYOTHERAPY     for abnormal pap   HEMORRHOID SURGERY     dr. Irving Shows   PARTIAL HYSTERECTOMY      Home Medications:  (Not in a hospital admission)   Allergies:  Allergies  Allergen Reactions   Janumet [Sitagliptin-Metformin Hcl]     States she vomits up the whole pill after taking this   Robaxin [Methocarbamol] Swelling    Family History  Problem Relation Age of Onset   Cancer Mother        lung    COPD Father        emphysema   Hypertension Father    Cancer Maternal Aunt        breast   Cancer Paternal Uncle    Colon cancer Neg Hx     Social History:  reports that she quit smoking about 6 years ago. Her smoking use included cigarettes. She has a 0.50 pack-year smoking history. She has never used smokeless tobacco. She reports that she does not drink alcohol and does not use  drugs.  ROS: A complete review of systems was performed.  All systems are negative except for pertinent findings as noted.  Physical Exam:  Vital signs in last 24 hours: @VSRANGES @ General:  Alert and oriented, No acute distress HEENT: Normocephalic, atraumatic Neck: No JVD or lymphadenopathy Cardiovascular: Regular rate  Lungs: Normal inspiratory/expiratory excursion Extremities: No edema Neurologic: Grossly intact  I have reviewed prior pt notes  I have reviewed notes from referring/previous physicians   Impression/Assessment:  Overactive bladder-improved with Myrbetriq 50 mg daily.  Somewhat cost prohibitive, however  Plan:  1.  I will have her take the Myrbetriq 50 mg every other day.  It has a long half-life and this should provide adequate management of her overactive bladder  2.  It is still important to write for her prescription to be taken every day  3.  If she finds that her symptoms get a little bit worse on this dose, she will call and we will consider adding an antimuscarinic  4.  I will have her come back in a year for recheck  Jorja Loa 09/21/2022, 9:01 AM  Lillette Boxer. Kemi Gell MD

## 2022-09-27 ENCOUNTER — Telehealth: Payer: Self-pay

## 2022-09-27 ENCOUNTER — Other Ambulatory Visit: Payer: Self-pay

## 2022-09-27 MED ORDER — CIPROFLOXACIN-DEXAMETHASONE 0.3-0.1 % OT SUSP: 4.0000 [drp] | Freq: Two times a day (BID) | OTIC | 0 refills | Status: DC

## 2022-09-27 NOTE — Telephone Encounter (Signed)
One refill sent

## 2022-09-27 NOTE — Telephone Encounter (Signed)
Still having ear aches, need refill on medicine  ciprofloxacin-dexamethasone ([place]) [place], [place] - [address]  [address] [place], [place] [address]  Phone:  [phone]  Fax:  [phone]  DEA #:  --

## 2022-10-05 NOTE — Patient Instructions (Addendum)
MEEYAH OVITT  10/05/2022     @PREFPERIOPPHARMACY @   Your procedure is scheduled on  10/08/2022.   Report to [facility] at  [facility].M.   Call this number if you have problems the morning of surgery:  [phone]  If you experience any cold or flu symptoms such as cough, fever, chills, shortness of breath, etc. between now and your scheduled surgery, please notify us at the above number.   Remember:  Follow the diet and prep instructions given to you bby the office.     Use your inhalers before you come and bring your rescue inhaler with you.    Take 12 units of your night time insulin the night before your procedure.     DO NOT take any medications for diabetes the morning of your procedure.     Take these medicines the morning of surgery with A SIP OF WATER                                             None    Do not wear jewelry, make-up or nail polish.  Do not wear lotions, powders, or perfumes, or deodorant.  Do not shave 48 hours prior to surgery.  Men may shave face and neck.  Do not bring valuables to the hospital.  [facility] is not responsible for any belongings or valuables.  Contacts, dentures or bridgework may not be worn into surgery.  Leave your suitcase in the car.  After surgery it may be brought to your room.  For patients admitted to the hospital, discharge time will be determined by your treatment team.  Patients discharged the day of surgery will not be allowed to drive home and must have someone with them for 24 hours.    Special instructions:   DO NOT smoke tobacco or vape for 24 hours before your procedure.  Please read over the following fact sheets that you were given. Anesthesia Post-op Instructions and Care and Recovery After Surgery       Colonoscopy, Adult, Care After The following information offers guidance on how to care for yourself after your procedure. Your health care provider may also give you more specific  instructions. If you have problems or questions, contact your health care provider. What can I expect after the procedure? After the procedure, it is common to have: A small amount of blood in your stool for 24 hours after the procedure. Some gas. Mild cramping or bloating of your abdomen. Follow these instructions at home: Eating and drinking  Drink enough fluid to keep your urine pale yellow. Follow instructions from your health care provider about eating or drinking restrictions. Resume your normal diet as told by your health care provider. Avoid heavy or fried foods that are hard to digest. Activity Rest as told by your health care provider. Avoid sitting for a long time without moving. Get up to take short walks every 1-2 hours. This is important to improve blood flow and breathing. Ask for help if you feel weak or unsteady. Return to your normal activities as told by your health care provider. Ask your health care provider what activities are safe for you. Managing cramping and bloating  Try walking around when you have cramps or feel bloated. If directed, apply heat to your abdomen as told by your  health care provider. Use the heat source that your health care provider recommends, such as a moist heat pack or a heating pad. Place a towel between your skin and the heat source. Leave the heat on for 20-30 minutes. Remove the heat if your skin turns bright red. This is especially important if you are unable to feel pain, heat, or cold. You have a greater risk of getting burned. General instructions If you were given a sedative during the procedure, it can affect you for several hours. Do not drive or operate machinery until your health care provider says that it is safe. For the first 24 hours after the procedure: Do not sign important documents. Do not drink alcohol. Do your regular daily activities at a slower pace than normal. Eat soft foods that are easy to digest. Take  over-the-counter and prescription medicines only as told by your health care provider. Keep all follow-up visits. This is important. Contact a health care provider if: You have blood in your stool 2-3 days after the procedure. Get help right away if: You have more than a small spotting of blood in your stool. You have large blood clots in your stool. You have swelling of your abdomen. You have nausea or vomiting. You have a fever. You have increasing pain in your abdomen that is not relieved with medicine. These symptoms may be an emergency. Get help right away. Call 911. Do not wait to see if the symptoms will go away. Do not drive yourself to the hospital. Summary After the procedure, it is common to have a small amount of blood in your stool. You may also have mild cramping and bloating of your abdomen. If you were given a sedative during the procedure, it can affect you for several hours. Do not drive or operate machinery until your health care provider says that it is safe. Get help right away if you have a lot of blood in your stool, nausea or vomiting, a fever, or increased pain in your abdomen. This information is not intended to replace advice given to you by your health care provider. Make sure you discuss any questions you have with your health care provider. Document Revised: 07/08/2021 Document Reviewed: 07/08/2021 Elsevier Patient Education  [facility] After The following information offers guidance on how to care for yourself after your procedure. Your health care provider may also give you more specific instructions. If you have problems or questions, contact your health care provider. What can I expect after the procedure? After the procedure, it is common to have: Tiredness. Little or no memory about what happened during or after the procedure. Impaired judgment when it comes to making decisions. Nausea or vomiting. Some trouble  with balance. Follow these instructions at home: For the time period you were told by your health care provider:  Rest. Do not participate in activities where you could fall or become injured. Do not drive or use machinery. Do not drink alcohol. Do not take sleeping pills or medicines that cause drowsiness. Do not make important decisions or sign legal documents. Do not take care of children on your own. Medicines Take over-the-counter and prescription medicines only as told by your health care provider. If you were prescribed antibiotics, take them as told by your health care provider. Do not stop using the antibiotic even if you start to feel better. Eating and drinking Follow instructions from your health care provider about what you may eat and  drink. Drink enough fluid to keep your urine pale yellow. If you vomit: Drink clear fluids slowly and in small amounts as you are able. Clear fluids include water, ice chips, low-calorie sports drinks, and fruit juice that has water added to it (diluted fruit juice). Eat light and bland foods in small amounts as you are able. These foods include bananas, applesauce, rice, lean meats, toast, and crackers. General instructions  Have a responsible adult stay with you for the time you are told. It is important to have someone help care for you until you are awake and alert. If you have sleep apnea, surgery and some medicines can increase your risk for breathing problems. Follow instructions from your health care provider about wearing your sleep device: When you are sleeping. This includes during daytime naps. While taking prescription pain medicines, sleeping medicines, or medicines that make you drowsy. Do not use any products that contain nicotine or tobacco. These products include cigarettes, chewing tobacco, and vaping devices, such as e-cigarettes. If you need help quitting, ask your health care provider. Contact a health care provider  if: You feel nauseous or vomit every time you eat or drink. You feel light-headed. You are still sleepy or having trouble with balance after 24 hours. You get a rash. You have a fever. You have redness or swelling around the IV site. Get help right away if: You have trouble breathing. You have new confusion after you get home. These symptoms may be an emergency. Get help right away. Call 911. Do not wait to see if the symptoms will go away. Do not drive yourself to the hospital. This information is not intended to replace advice given to you by your health care provider. Make sure you discuss any questions you have with your health care provider. Document Revised: 04/12/2022 Document Reviewed: 04/12/2022 Elsevier Patient Education  [place].

## 2022-10-06 ENCOUNTER — Encounter (HOSPITAL_COMMUNITY): Payer: Self-pay

## 2022-10-06 ENCOUNTER — Encounter (HOSPITAL_COMMUNITY)
Admission: RE | Admit: 2022-10-06 | Discharge: 2022-10-06 | Disposition: A | Discharge status: Home / Self Care | Payer: Medicare Other | Source: Ambulatory Visit | Attending: Internal Medicine | Admitting: Internal Medicine

## 2022-10-06 DIAGNOSIS — E1165 Type 2 diabetes mellitus with hyperglycemia: Secondary | ICD-10-CM | POA: Insufficient documentation

## 2022-10-06 DIAGNOSIS — I498 Other specified cardiac arrhythmias: Secondary | ICD-10-CM | POA: Insufficient documentation

## 2022-10-06 DIAGNOSIS — I10 Essential (primary) hypertension: Secondary | ICD-10-CM

## 2022-10-06 HISTORY — DX: Unspecified urinary incontinence: R32

## 2022-10-08 ENCOUNTER — Ambulatory Visit (HOSPITAL_BASED_OUTPATIENT_CLINIC_OR_DEPARTMENT_OTHER): Payer: Medicare Other | Admitting: Anesthesiology

## 2022-10-08 ENCOUNTER — Encounter (HOSPITAL_COMMUNITY): Payer: Self-pay

## 2022-10-08 ENCOUNTER — Encounter (HOSPITAL_COMMUNITY)
Admission: RE | Disposition: A | Discharge status: Home / Self Care | Payer: Self-pay | Source: Home / Self Care | Attending: Internal Medicine

## 2022-10-08 ENCOUNTER — Ambulatory Visit (HOSPITAL_COMMUNITY): Payer: Medicare Other | Admitting: Anesthesiology

## 2022-10-08 ENCOUNTER — Ambulatory Visit (HOSPITAL_COMMUNITY)
Admission: RE | Admit: 2022-10-08 | Discharge: 2022-10-08 | Disposition: A | Discharge status: Home / Self Care | Payer: Medicare Other | Attending: Internal Medicine | Admitting: Internal Medicine

## 2022-10-08 DIAGNOSIS — J45909 Unspecified asthma, uncomplicated: Secondary | ICD-10-CM | POA: Insufficient documentation

## 2022-10-08 DIAGNOSIS — D123 Benign neoplasm of transverse colon: Secondary | ICD-10-CM | POA: Diagnosis not present

## 2022-10-08 DIAGNOSIS — Z794 Long term (current) use of insulin: Secondary | ICD-10-CM | POA: Insufficient documentation

## 2022-10-08 DIAGNOSIS — K635 Polyp of colon: Secondary | ICD-10-CM | POA: Diagnosis not present

## 2022-10-08 DIAGNOSIS — Z1211 Encounter for screening for malignant neoplasm of colon: Secondary | ICD-10-CM | POA: Diagnosis not present

## 2022-10-08 DIAGNOSIS — E119 Type 2 diabetes mellitus without complications: Secondary | ICD-10-CM | POA: Insufficient documentation

## 2022-10-08 DIAGNOSIS — Z6841 Body Mass Index (BMI) 40.0 and over, adult: Secondary | ICD-10-CM | POA: Diagnosis not present

## 2022-10-08 DIAGNOSIS — Z139 Encounter for screening, unspecified: Secondary | ICD-10-CM | POA: Diagnosis not present

## 2022-10-08 DIAGNOSIS — K648 Other hemorrhoids: Secondary | ICD-10-CM | POA: Insufficient documentation

## 2022-10-08 DIAGNOSIS — Z7984 Long term (current) use of oral hypoglycemic drugs: Secondary | ICD-10-CM | POA: Diagnosis not present

## 2022-10-08 DIAGNOSIS — D125 Benign neoplasm of sigmoid colon: Secondary | ICD-10-CM | POA: Diagnosis not present

## 2022-10-08 DIAGNOSIS — Z1212 Encounter for screening for malignant neoplasm of rectum: Secondary | ICD-10-CM

## 2022-10-08 DIAGNOSIS — Z87891 Personal history of nicotine dependence: Secondary | ICD-10-CM | POA: Diagnosis not present

## 2022-10-08 DIAGNOSIS — D124 Benign neoplasm of descending colon: Secondary | ICD-10-CM | POA: Diagnosis not present

## 2022-10-08 DIAGNOSIS — I10 Essential (primary) hypertension: Secondary | ICD-10-CM | POA: Diagnosis not present

## 2022-10-08 DIAGNOSIS — K5730 Diverticulosis of large intestine without perforation or abscess without bleeding: Secondary | ICD-10-CM | POA: Diagnosis not present

## 2022-10-08 DIAGNOSIS — E1165 Type 2 diabetes mellitus with hyperglycemia: Secondary | ICD-10-CM

## 2022-10-08 HISTORY — PX: POLYPECTOMY: SHX5525

## 2022-10-08 HISTORY — PX: COLONOSCOPY WITH PROPOFOL: SHX5780

## 2022-10-08 LAB — POCT I-STAT, CHEM 8
BUN: 9 mg/dL (ref 8–23)
Calcium, Ion: 1.17 mmol/L (ref 1.15–1.40)
Chloride: 101 mmol/L (ref 98–111)
Creatinine, Ser: 0.8 mg/dL (ref 0.44–1.00)
Glucose, Bld: 128 mg/dL — ABNORMAL HIGH (ref 70–99)
HCT: 43 % (ref 36.0–46.0)
Hemoglobin: 14.6 g/dL (ref 12.0–15.0)
Potassium: 3.8 mmol/L (ref 3.5–5.1)
Sodium: 142 mmol/L (ref 135–145)
TCO2: 31 mmol/L (ref 22–32)

## 2022-10-08 SURGERY — COLONOSCOPY WITH PROPOFOL
Anesthesia: General

## 2022-10-08 MED ORDER — LACTATED RINGERS IV SOLN: INTRAVENOUS | Status: DC

## 2022-10-08 MED ORDER — LIDOCAINE HCL (CARDIAC) PF 100 MG/5ML IV SOSY
PREFILLED_SYRINGE | INTRAVENOUS | Status: DC | PRN
  Administered 2022-10-08: 50 mg via INTRAVENOUS

## 2022-10-08 MED ORDER — PROPOFOL 10 MG/ML IV BOLUS
INTRAVENOUS | Status: DC | PRN
  Administered 2022-10-08: 100 mg via INTRAVENOUS
  Administered 2022-10-08: 50 mg via INTRAVENOUS
  Administered 2022-10-08: 30 mg via INTRAVENOUS

## 2022-10-08 NOTE — Op Note (Signed)
[facility] Patient Name: Leslie Lyons Procedure Date: 10/08/2022 10:15 AM MRN: [id number] Date of Birth: [birth year] Attending MD: Elon Alas. Abbey Chatters , [place], [id number] CSN: [id number] Age: 61 Admit Type: Outpatient Procedure:                Colonoscopy Indications:              Screening for colorectal malignant neoplasm Providers:                Elon Alas. Abbey Chatters, DO, Caprice Kluver, Casimer Bilis, Technician Referring MD:             Elon Alas. Abbey Chatters, DO Medicines:                See the Anesthesia note for documentation of the                            administered medications Complications:            No immediate complications. Estimated Blood Loss:     Estimated blood loss was minimal. Procedure:                Pre-Anesthesia Assessment:                           - The anesthesia plan was to use monitored                            anesthesia care (MAC).                           After obtaining informed consent, the colonoscope                            was passed under direct vision. Throughout the                            procedure, the patient's blood pressure, pulse, and                            oxygen saturations were monitored continuously. The                            [id number]-[id number] ([id number]) was introduced through the                            anus and advanced to the the cecum, identified by                            appendiceal orifice and ileocecal valve. The                            colonoscopy was performed without difficulty. The                            patient tolerated the  procedure well. The quality                            of the bowel preparation was evaluated using the                            BBPS [facility] Bowel Preparation Scale) with scores                            of: Right Colon = 3, Transverse Colon = 3 and Left                            Colon = 3 (entire mucosa seen well with no residual                             staining, small fragments of stool or opaque                            liquid). The total BBPS score equals 9. Scope In: 10:24:37 AM Scope Out: 10:34:37 AM Scope Withdrawal Time: 0 hours 7 minutes 26 seconds  Total Procedure Duration: 0 hours 10 minutes 0 seconds  Findings:      Hemorrhoids were found on perianal exam.      Non-bleeding internal hemorrhoids were found during endoscopy.      A few small-mouthed diverticula were found in the sigmoid colon.      Three sessile polyps were found in the sigmoid colon, descending colon       and transverse colon. The polyps were 3 to 7 mm in size. These polyps       were removed with a cold snare. Resection and retrieval were complete. Impression:               - Hemorrhoids found on perianal exam.                           - Non-bleeding internal hemorrhoids.                           - Diverticulosis in the sigmoid colon.                           - Three 3 to 7 mm polyps in the sigmoid colon, in                            the descending colon and in the transverse colon,                            removed with a cold snare. Resected and retrieved. Moderate Sedation:      Per Anesthesia Care Recommendation:           - Patient has a contact number available for                            emergencies. The signs and symptoms of potential  delayed complications were discussed with the                            patient. Return to normal activities tomorrow.                            Written discharge instructions were provided to the                            patient.                           - Resume previous diet.                           - Continue present medications.                           - Await pathology results.                           - Repeat colonoscopy in 5 years for surveillance.                           - Return to GI clinic in 6 months. Procedure Code(s):        ---  Professional ---                           [id number], Colonoscopy, flexible; with removal of                            tumor(s), polyp(s), or other lesion(s) by snare                            technique Diagnosis Code(s):        --- Professional ---                           Z12.11, Encounter for screening for malignant                            neoplasm of colon                           K64.8, Other hemorrhoids                           D12.5, Benign neoplasm of sigmoid colon                           D12.4, Benign neoplasm of descending colon                           D12.3, Benign neoplasm of transverse colon (hepatic                            flexure or splenic flexure)  K57.30, Diverticulosis of large intestine without                            perforation or abscess without bleeding CPT copyright 2022 American Medical Association. All rights reserved. The codes documented in this report are preliminary and upon coder review may  be revised to meet current compliance requirements. Elon Alas. Abbey Chatters, DO [place] Abbey Chatters, DO 10/08/2022 10:37:15 AM This report has been signed electronically. Number of Addenda: 0

## 2022-10-08 NOTE — Anesthesia Postprocedure Evaluation (Signed)
Anesthesia Post Note  Patient: Leslie Lyons  Procedure(s) Performed: COLONOSCOPY WITH PROPOFOL POLYPECTOMY  Patient location during evaluation: Phase II Anesthesia Type: General Level of consciousness: awake and alert and oriented Pain management: pain level controlled Vital Signs Assessment: post-procedure vital signs reviewed and stable Respiratory status: spontaneous breathing, nonlabored ventilation and respiratory function stable Cardiovascular status: blood pressure returned to baseline and stable Postop Assessment: no apparent nausea or vomiting Anesthetic complications: no  No notable events documented.   Last Vitals:  Vitals:   10/08/22 1000 10/08/22 1039  BP: (!) 158/89 133/72  Pulse: 90 91  Resp: 17 18  Temp: 37.2 C 36.8 C  SpO2: 96% 95%    Last Pain:  Vitals:   10/08/22 1039  TempSrc: Oral  PainSc: 0-No pain                 Cleophus Mendonsa C Dylon Correa

## 2022-10-08 NOTE — Discharge Instructions (Signed)
  Colonoscopy Discharge Instructions  Read the instructions outlined below and refer to this sheet in the next few weeks. These discharge instructions provide you with general information on caring for yourself after you leave the hospital. Your doctor may also give you specific instructions. While your treatment has been planned according to the most current medical practices available, unavoidable complications occasionally occur.   ACTIVITY You may resume your regular activity, but move at a slower pace for the next 24 hours.  Take frequent rest periods for the next 24 hours.  Walking will help get rid of the air and reduce the bloated feeling in your belly (abdomen).  No driving for 24 hours (because of the medicine (anesthesia) used during the test).   Do not sign any important legal documents or operate any machinery for 24 hours (because of the anesthesia used during the test).  NUTRITION Drink plenty of fluids.  You may resume your normal diet as instructed by your doctor.  Begin with a light meal and progress to your normal diet. Heavy or fried foods are harder to digest and may make you feel sick to your stomach (nauseated).  Avoid alcoholic beverages for 24 hours or as instructed.  MEDICATIONS You may resume your normal medications unless your doctor tells you otherwise.  WHAT YOU CAN EXPECT TODAY Some feelings of bloating in the abdomen.  Passage of more gas than usual.  Spotting of blood in your stool or on the toilet paper.  IF YOU HAD POLYPS REMOVED DURING THE COLONOSCOPY: No aspirin products for 7 days or as instructed.  No alcohol for 7 days or as instructed.  Eat a soft diet for the next 24 hours.  FINDING OUT THE RESULTS OF YOUR TEST Not all test results are available during your visit. If your test results are not back during the visit, make an appointment with your caregiver to find out the results. Do not assume everything is normal if you have not heard from your  caregiver or the medical facility. It is important for you to follow up on all of your test results.  SEEK IMMEDIATE MEDICAL ATTENTION IF: You have more than a spotting of blood in your stool.  Your belly is swollen (abdominal distention).  You are nauseated or vomiting.  You have a temperature over 101.  You have abdominal pain or discomfort that is severe or gets worse throughout the day.   Your colonoscopy revealed 3 polyp(s) which I removed successfully. Await pathology results, my office will contact you. I recommend repeating colonoscopy in 5 years for surveillance purposes.   You also have diverticulosis and internal hemorrhoids. I would recommend increasing fiber in your diet or adding OTC Benefiber/Metamucil. Be sure to drink at least 4 to 6 glasses of water daily. Follow-up with GI in 6 months   I hope you have a great rest of your week!  Elon Alas. Abbey Chatters, D.O. Gastroenterology and Hepatology [facility]

## 2022-10-08 NOTE — Anesthesia Preprocedure Evaluation (Signed)
Anesthesia Evaluation  Patient identified by MRN, date of birth, ID band Patient awake    Reviewed: Allergy & Precautions, H&P , NPO status , Patient's Chart, lab work & pertinent test results  Airway Mallampati: II  TM Distance: >3 FB Neck ROM: Full    Dental  (+) Dental Advisory Given, Chipped, Poor Dentition, Missing   Pulmonary asthma , former smoker   Pulmonary exam normal breath sounds clear to auscultation       Cardiovascular Exercise Tolerance: Good hypertension, Pt. on medications Normal cardiovascular exam Rhythm:Regular Rate:Normal     Neuro/Psych  Headaches PSYCHIATRIC DISORDERS Anxiety Depression       GI/Hepatic negative GI ROS,,,(+) Hepatitis -  Endo/Other  diabetes, Well Controlled, Type 2, Oral Hypoglycemic Agents, Insulin Dependent  Morbid obesity  Renal/GU Renal disease Bladder dysfunction (bladder incontinence) Female GU complaint (cervical cancer)     Musculoskeletal negative musculoskeletal ROS (+)    Abdominal   Peds negative pediatric ROS (+)  Hematology negative hematology ROS (+)   Anesthesia Other Findings Back pain  Reproductive/Obstetrics negative OB ROS                              Anesthesia Physical Anesthesia Plan  ASA: 3  Anesthesia Plan: General   Post-op Pain Management: Minimal or no pain anticipated   Induction: Intravenous  PONV Risk Score and Plan: 1 and Propofol infusion  Airway Management Planned: Nasal Cannula and Natural Airway  Additional Equipment:   Intra-op Plan:   Post-operative Plan:   Informed Consent: I have reviewed the patients History and Physical, chart, labs and discussed the procedure including the risks, benefits and alternatives for the proposed anesthesia with the patient or authorized representative who has indicated his/her understanding and acceptance.     Dental advisory given  Plan Discussed with: CRNA  and Surgeon  Anesthesia Plan Comments:          Anesthesia Quick Evaluation

## 2022-10-08 NOTE — H&P (Signed)
Primary Care Physician:  [name], MD Primary Gastroenterologist:  Dr. Abbey Chatters  Pre-Procedure History & Physical: HPI:  Leslie Lyons is a 61 y.o. female is here for a colonoscopy for colon cancer screening purposes.  Patient denies any family history of colorectal cancer.  No melena or hematochezia.  No abdominal pain or unintentional weight loss.  No change in bowel habits.  Overall feels well from a GI standpoint.  Past Medical History:  Diagnosis Date   Abnormal transaminases    Anxiety    Bladder incontinence    Bronchial asthma    with acute exacerbation   Cancer ([place]) 1980   cervical, diag  at age 30   Depression    Diabetes ([place])    Diabetes mellitus, type II ([place])    Headache(784.0)    with reduced vision in right eye for one month    Hyperlipidemia    Hypertension    Obesity     Past Surgical History:  Procedure Laterality Date   ABDOMINAL HYSTERECTOMY     COLONOSCOPY WITH ESOPHAGOGASTRODUODENOSCOPY (EGD)  11/2008   Dr. Arnoldo Morale: Conscious sedation.  Hiatal hernia.  Hemorrhoids. PATIENT REPORTS BEING AWAKE DURING PROCEDURE   CRYOTHERAPY     for abnormal pap   HEMORRHOID SURGERY     dr. Irving Shows   PARTIAL HYSTERECTOMY      Prior to Admission medications   Medication Sig Start Date End Date Taking? Authorizing Provider  albuterol (PROAIR HFA) 108 (90 Base) MCG/ACT inhaler Inhale 2 puffs into the lungs every 6 (six) hours as needed. 04/22/22  Yes [name], MD  amLODipine (NORVASC) 10 MG tablet Take 10 mg by mouth daily. 08/29/22  Yes [provider]  blood glucose meter kit and supplies Dispense based on patient and insurance preference. Test blood sugar once daily before breakfast. (FOR ICD-10 E10.9, E11.9). 10/30/20  Yes [name], MD  Blood Glucose Monitoring Suppl (ONETOUCH VERIO FLEX SYSTEM) w/Device KIT Use to monitor glucose twice daily 04/23/22  Yes Reardon, Juanetta Beets, NP  ciprofloxacin-dexamethasone (CIPRODEX) OTIC  suspension Place 4 drops into the right ear 2 (two) times daily. 09/27/22  Yes [name], MD  diphenoxylate-atropine (LOMOTIL) 2.5-0.025 MG tablet Take one tablet by mouth three times daily , as needed, for loose stool 07/23/22  Yes [name], MD  ezetimibe (ZETIA) 10 MG tablet Take 10 mg by mouth daily. 07/22/22  Yes [provider]  glipiZIDE (GLUCOTROL XL) 5 MG 24 hr tablet Take 1 tablet (5 mg total) by mouth daily with breakfast. 04/23/22  Yes Rayetta Pigg J, NP  glucose blood (ONETOUCH VERIO) test strip Use as instructed to measure glucose twice daily 04/23/22  Yes Reardon, Loree Fee J, NP  insulin degludec (TRESIBA FLEXTOUCH) 100 UNIT/ML FlexTouch Pen Inject 24 Units into the skin at bedtime. 08/17/22  Yes Brita Romp, NP  Insulin Pen Needle (PEN NEEDLES) 32G X 4 MM MISC Use to inject insulin once daily 08/17/22  Yes Reardon, Juanetta Beets, NP  Lancets [facility] ULTRASOFT) lancets Use as instructed to monitor glucose twice daily 04/23/22  Yes Reardon, Juanetta Beets, NP  losartan (COZAAR) 100 MG tablet Take 1 tablet (100 mg total) by mouth daily. 09/15/22  Yes [name], MD  mirabegron ER (MYRBETRIQ) 50 MG TB24 tablet Take 1 tablet (50 mg total) by mouth daily. 09/15/22  Yes [name], MD  mometasone-formoterol (DULERA) 200-5 MCG/ACT AERO Inhale 2 puffs into the lungs 2 (two) times daily. 04/22/22  Yes [name], MD    Allergies as of 08/05/2022 - Review Complete 08/05/2022  Allergen Reaction Noted   Janumet [sitagliptin-metformin hcl]  10/03/2018   Robaxin [methocarbamol] Swelling 07/31/2013    Family History  Problem Relation Age of Onset   Cancer Mother        lung    COPD Father        emphysema   Hypertension Father    Cancer Maternal Aunt        breast   Cancer Paternal Uncle    Colon cancer Neg Hx     Social History   Socioeconomic History   Marital status: Married    Spouse name: Not on file   Number of children: 2    Years of education: Not on file   Highest education level: Not on file  Occupational History   Occupation: homemaker   Tobacco Use   Smoking status: Former    Packs/day: 0.25    Years: 2.00    Total pack years: 0.50    Types: Cigarettes    Quit date: 12/04/2015    Years since quitting: 6.8   Smokeless tobacco: Never  Vaping Use   Vaping Use: Never used  Substance and Sexual Activity   Alcohol use: No   Drug use: No   Sexual activity: Yes  Other Topics Concern   Not on file  Social History Narrative   Lives at home with husband    Social Determinants of Health   Financial Resource Strain: Low Risk  (11/13/2021)   Overall Financial Resource Strain (CARDIA)    Difficulty of Paying Living Expenses: Not hard at all  Food Insecurity: No Food Insecurity (11/13/2021)   Hunger Vital Sign    Worried About Running Out of Food in the Last Year: Never true    Ran Out of Food in the Last Year: Never true  Transportation Needs: No Transportation Needs (11/13/2021)   PRAPARE - Hydrologist (Medical): No    Lack of Transportation (Non-Medical): No  Physical Activity: Inactive (11/13/2021)   Exercise Vital Sign    Days of Exercise per Week: 0 days    Minutes of Exercise per Session: 0 min  Stress: No Stress Concern Present (11/13/2021)   [place]    Feeling of Stress : Not at all  Social Connections: Moderately Isolated (11/13/2021)   Social Connection and Isolation Panel [NHANES]    Frequency of Communication with Friends and Family: Twice a week    Frequency of Social Gatherings with Friends and Family: Twice a week    Attends Religious Services: Never    Marine scientist or Organizations: No    Attends Archivist Meetings: Never    Marital Status: Married  Human resources officer Violence: Unknown (11/13/2021)   Humiliation, Afraid, Rape, and Kick questionnaire    Fear of  Current or Ex-Partner: No    Emotionally Abused: No    Physically Abused: No    Sexually Abused: Not on file    Review of Systems: See HPI, otherwise negative ROS  Physical Exam: Vital signs in last 24 hours:     General:   Alert,  Well-developed, well-nourished, pleasant and cooperative in NAD Head:  Normocephalic and atraumatic. Eyes:  Sclera clear, no icterus.   Conjunctiva pink. Ears:  Normal auditory acuity. Nose:  No deformity, discharge,  or lesions. Mouth:  No deformity or lesions, dentition normal.  Neck:  Supple; no masses or thyromegaly. Lungs:  Clear throughout to auscultation.   No wheezes, crackles, or rhonchi. No acute distress. Heart:  Regular rate and rhythm; no murmurs, clicks, rubs,  or gallops. Abdomen:  Soft, nontender and nondistended. No masses, hepatosplenomegaly or hernias noted. Normal bowel sounds, without guarding, and without rebound.   Msk:  Symmetrical without gross deformities. Normal posture. Extremities:  Without clubbing or edema. Neurologic:  Alert and  oriented x4;  grossly normal neurologically. Skin:  Intact without significant lesions or rashes. Cervical Nodes:  No significant cervical adenopathy. Psych:  Alert and cooperative. Normal mood and affect.  Impression/Plan: Leslie Lyons is here for a colonoscopy to be performed for colon cancer screening purposes.  The risks of the procedure including infection, bleed, or perforation as well as benefits, limitations, alternatives and imponderables have been reviewed with the patient. Questions have been answered. All parties agreeable.

## 2022-10-08 NOTE — Transfer of Care (Signed)
Immediate Anesthesia Transfer of Care Note  Patient: Leslie Lyons  Procedure(s) Performed: COLONOSCOPY WITH PROPOFOL POLYPECTOMY  Patient Location: Short Stay  Anesthesia Type:General  Level of Consciousness: awake and alert   Airway & Oxygen Therapy: Patient Spontanous Breathing  Post-op Assessment: Report given to RN and Post -op Vital signs reviewed and stable  Post vital signs: Reviewed and stable  Last Vitals:  Vitals Value Taken Time  BP 133/72 10/08/22 1039  Temp 36.8 C 10/08/22 1039  Pulse 91 10/08/22 1039  Resp 18 10/08/22 1039  SpO2 95 % 10/08/22 1039    Last Pain:  Vitals:   10/08/22 1039  TempSrc: Oral  PainSc: 0-No pain      Patients Stated Pain Goal: 6 (79/43/27 6147)  Complications: No notable events documented.

## 2022-10-11 LAB — SURGICAL PATHOLOGY

## 2022-10-14 ENCOUNTER — Encounter (HOSPITAL_COMMUNITY): Payer: Self-pay | Admitting: Internal Medicine

## 2022-11-04 ENCOUNTER — Other Ambulatory Visit: Payer: Self-pay | Admitting: Family Medicine

## 2022-11-04 ENCOUNTER — Telehealth: Payer: Self-pay | Admitting: Family Medicine

## 2022-11-04 ENCOUNTER — Other Ambulatory Visit: Payer: Self-pay

## 2022-11-04 MED ORDER — EZETIMIBE 10 MG PO TABS: 10.0000 mg | ORAL_TABLET | Freq: Every day | ORAL | 2 refills | Status: DC

## 2022-11-04 NOTE — Telephone Encounter (Signed)
Refills sent

## 2022-11-04 NOTE — Telephone Encounter (Signed)
Prescription Request  11/04/2022  Is this a "Controlled Substance" medicine? No  LOV: 09/15/2022  What is the name of the medication or equipment? ezetimibe (ZETIA) 10 MG tablet [[id number]]   Have you contacted your pharmacy to request a refill? No   Which pharmacy would you like this sent to?  [place] [place] [facility] [address] [place] [address] Phone: [phone] Fax: [phone]    Patient notified that their request is being sent to the clinical staff for review and that they should receive a response within 2 business days.   Please advise at [place]

## 2022-11-05 NOTE — Progress Notes (Signed)
[facility] Quality Team Note  Name: AHLANA SLAYDON Date of Birth: Mar 27, 1961 MRN: [id number] Date: 11/05/2022  [facility] Quality Team has reviewed this patient's chart, please see recommendations below:  [facility] Quality Other; (KED: Kidney Health Evaluation Gap- Patient needs Urine Albumin Creatinine Ratio Test completed for gap closure. EGFR has already been completed, Patient has upcoming appointment with [facility] 11/10/2022.)

## 2022-11-10 ENCOUNTER — Encounter: Payer: Medicare Other | Admitting: Family Medicine

## 2022-11-11 ENCOUNTER — Encounter: Payer: Self-pay | Admitting: Family Medicine

## 2022-11-15 ENCOUNTER — Ambulatory Visit (INDEPENDENT_AMBULATORY_CARE_PROVIDER_SITE_OTHER): Payer: Medicare Other

## 2022-11-15 DIAGNOSIS — Z0000 Encounter for general adult medical examination without abnormal findings: Secondary | ICD-10-CM | POA: Diagnosis not present

## 2022-11-15 NOTE — Progress Notes (Signed)
Subjective:   Leslie Lyons is a 61 y.o. female who presents for Medicare Annual (Subsequent) preventive examination.  Review of Systems    I connected with  Leslie Lyons on 11/15/22 by a audio enabled telemedicine application and verified that I am speaking with the correct person using two identifiers.  Patient Location: Home  Provider Location: Office/Clinic  I discussed the limitations of evaluation and management by telemedicine. The patient expressed understanding and agreed to proceed.        Objective:    There were no vitals filed for this visit. There is no height or weight on file to calculate BMI.     10/08/2022   10:02 AM 10/06/2022    1:45 PM 11/13/2021    9:48 AM 03/09/2021    9:08 AM 11/12/2020    3:45 PM 04/26/2020   11:13 PM  Advanced Directives  Does Patient Have a Medical Advance Directive? _0  No  Would patient like information on creating a medical advance directive? No - Patient declined No - Patient declined No - Patient declined No - Patient declined No - Patient declined No - Patient declined    Current Medications (verified) Outpatient Encounter Medications as of 11/15/2022  Medication Sig   albuterol (PROAIR HFA) 108 (90 Base) MCG/ACT inhaler Inhale 2 puffs into the lungs every 6 (six) hours as needed.   amLODipine (NORVASC) 10 MG tablet Take 10 mg by mouth daily.   blood glucose meter kit and supplies Dispense based on patient and insurance preference. Test blood sugar once daily before breakfast. (FOR ICD-10 E10.9, E11.9).   Blood Glucose Monitoring Suppl (ONETOUCH VERIO FLEX SYSTEM) w/Device KIT Use to monitor glucose twice daily   ciprofloxacin-dexamethasone (CIPRODEX) OTIC suspension Place 4 drops into the right ear 2 (two) times daily.   diphenoxylate-atropine (LOMOTIL) 2.5-0.025 MG tablet Take one tablet by mouth three times daily , as needed, for loose stool   ezetimibe (ZETIA) 10 MG tablet Take 1 tablet (10 mg total)  by mouth daily.   glipiZIDE (GLUCOTROL XL) 5 MG 24 hr tablet Take 1 tablet (5 mg total) by mouth daily with breakfast.   glucose blood (ONETOUCH VERIO) test strip Use as instructed to measure glucose twice daily   insulin degludec (TRESIBA FLEXTOUCH) 100 UNIT/ML FlexTouch Pen Inject 24 Units into the skin at bedtime.   Insulin Pen Needle (PEN NEEDLES) 32G X 4 MM MISC Use to inject insulin once daily   Lancets (ONETOUCH ULTRASOFT) lancets Use as instructed to monitor glucose twice daily   losartan (COZAAR) 100 MG tablet Take 1 tablet (100 mg total) by mouth daily.   mirabegron ER (MYRBETRIQ) 50 MG TB24 tablet Take 1 tablet (50 mg total) by mouth daily.   mometasone-formoterol (DULERA) 200-5 MCG/ACT AERO Inhale 2 puffs into the lungs 2 (two) times daily.   No facility-administered encounter medications on file as of 11/15/2022.    Allergies (verified) Janumet [sitagliptin-metformin hcl] and Robaxin [methocarbamol]   History: Past Medical History:  Diagnosis Date   Abnormal transaminases    Anxiety    Bladder incontinence    Bronchial asthma    with acute exacerbation   Cancer ([place]) 1980   cervical, diag  at age 69   Depression    Diabetes ([place])    Diabetes mellitus, type II ([place])    Headache(784.0)    with reduced vision in right eye for one month    Hyperlipidemia    Hypertension  Obesity    Past Surgical History:  Procedure Laterality Date   ABDOMINAL HYSTERECTOMY     COLONOSCOPY WITH ESOPHAGOGASTRODUODENOSCOPY (EGD)  11/2008   Dr. Arnoldo Morale: Conscious sedation.  Hiatal hernia.  Hemorrhoids. PATIENT REPORTS BEING AWAKE DURING PROCEDURE   COLONOSCOPY WITH PROPOFOL N/A 10/08/2022   Procedure: COLONOSCOPY WITH PROPOFOL;  Surgeon: Eloise Harman, DO;  Location: [facility];  Service: Endoscopy;  Laterality: N/A;  7:30 AM   CRYOTHERAPY     for abnormal pap   HEMORRHOID SURGERY     dr. Irving Shows   PARTIAL HYSTERECTOMY     POLYPECTOMY  10/08/2022   Procedure:  POLYPECTOMY;  Surgeon: Eloise Harman, DO;  Location: [facility];  Service: Endoscopy;;   Family History  Problem Relation Age of Onset   Cancer Mother        lung    COPD Father        emphysema   Hypertension Father    Cancer Maternal Aunt        breast   Cancer Paternal Uncle    Colon cancer Neg Hx    Social History   Socioeconomic History   Marital status: Married    Spouse name: Not on file   Number of children: 2   Years of education: Not on file   Highest education level: Not on file  Occupational History   Occupation: homemaker   Tobacco Use   Smoking status: Former    Packs/day: 0.25    Years: 2.00    Total pack years: 0.50    Types: Cigarettes    Quit date: 12/04/2015    Years since quitting: 6.9   Smokeless tobacco: Never  Vaping Use   Vaping Use: Never used  Substance and Sexual Activity   Alcohol use: No   Drug use: No   Sexual activity: Yes  Other Topics Concern   Not on file  Social History Narrative   Lives at home with husband    Social Determinants of Health   Financial Resource Strain: Low Risk  (11/13/2021)   Overall Financial Resource Strain (CARDIA)    Difficulty of Paying Living Expenses: Not hard at all  Food Insecurity: No Food Insecurity (11/13/2021)   Hunger Vital Sign    Worried About Running Out of Food in the Last Year: Never true    [place] in the Last Year: Never true  Transportation Needs: No Transportation Needs (11/13/2021)   PRAPARE - Hydrologist (Medical): No    Lack of Transportation (Non-Medical): No  Physical Activity: Inactive (11/13/2021)   Exercise Vital Sign    Days of Exercise per Week: 0 days    Minutes of Exercise per Session: 0 min  Stress: No Stress Concern Present (11/13/2021)   [place]    Feeling of Stress : Not at all  Social Connections: Moderately Isolated (11/13/2021)   Social Connection  and Isolation Panel [NHANES]    Frequency of Communication with Friends and Family: Twice a week    Frequency of Social Gatherings with Friends and Family: Twice a week    Attends Religious Services: Never    Marine scientist or Organizations: No    Attends Music therapist: Never    Marital Status: Married    Tobacco Counseling Counseling given: Not Answered   Clinical Intake:    Diabetic?yes Nutrition Risk Assessment:  Has the patient had  any N/V/D within the last 2 months?  No  Does the patient have any non-healing wounds?  No  Has the patient had any unintentional weight loss or weight gain?  No   Diabetes:  Is the patient diabetic?  Yes  If diabetic, was a CBG obtained today?  No  Did the patient bring in their glucometer from home?  No  How often do you monitor your CBG's? Daily.   Financial Strains and Diabetes Management:  Are you having any financial strains with the device, your supplies or your medication? No .  Does the patient want to be seen by Chronic Care Management for management of their diabetes?  No  Would the patient like to be referred to a Nutritionist or for Diabetic Management?  No   Diabetic Exams:  Diabetic Eye Exam: Overdue for diabetic eye exam. Pt has been advised about the importance in completing this exam. Patient advised to call and schedule an eye exam. Diabetic Foot Exam: Completed 09/19/22          Activities of Daily Living    10/06/2022    1:43 PM  In your present state of health, do you have any difficulty performing the following activities:  Hearing? 0  Vision? 0  Difficulty concentrating or making decisions? 0  Walking or climbing stairs? 1  Dressing or bathing? 0    Patient Care Team: [name], MD as PCP - General (Family Medicine) Madelin Headings, DO (Optometry) Fields, Marga Melnick, MD (Inactive) as Consulting Physician (Gastroenterology) Eloise Harman, DO as Consulting Physician  (Internal Medicine)  Indicate any recent Medical Services you may have received from other than [facility] providers in the past year (date may be approximate).     Assessment:   This is a routine wellness examination for [place].  Hearing/Vision screen No results found.  Dietary issues and exercise activities discussed:     Goals Addressed   None   Depression Screen    07/23/2022    9:23 AM 04/22/2022    9:22 AM 11/13/2021    9:48 AM 11/13/2021    9:45 AM 03/12/2021    9:06 AM 11/12/2020    3:45 PM 11/12/2020    3:41 PM  PHQ 2/9 Scores  PHQ - 2 Score 0 1 0 0 0 3 4    Fall Risk    07/23/2022    9:23 AM 04/22/2022    9:22 AM 11/13/2021    9:48 AM 06/30/2021    9:45 AM 03/12/2021    9:05 AM  Fall Risk   Falls in the past year? 0 0 0 0 1  Number falls in past yr: 0 0 0 0 1  Injury with Fall? 0 0 0 0 0  Risk for fall due to : No Fall Risks No Fall Risks No Fall Risks  Impaired balance/gait  Follow up Falls evaluation completed Falls evaluation completed Falls evaluation completed  Falls evaluation completed    FALL RISK PREVENTION PERTAINING TO THE HOME:  Any stairs in or around the home? No  If so, are there any without handrails?  N/a Home free of loose throw rugs in walkways, pet beds, electrical cords, etc? Yes  Adequate lighting in your home to reduce risk of falls? Yes   ASSISTIVE DEVICES UTILIZED TO PREVENT FALLS:  Life alert? No  Use of a cane, walker or w/c? Yes  Grab bars in the bathroom? No  Shower chair or bench in shower? Yes  Elevated toilet  seat or a handicapped toilet? No        11/13/2021    9:49 AM 11/12/2020    3:46 PM 04/06/2019    8:50 AM  6CIT Screen  What Year? 0 points 0 points 0 points  What month? 0 points 0 points 0 points  What time? 0 points 0 points 0 points  Count back from 20 0 points 0 points 0 points  Months in reverse 4 points 4 points 0 points  Repeat phrase 0 points 10 points 2 points  Total Score 4 points 14 points 2 points     Immunizations Immunization History  Administered Date(s) Administered   Influenza Whole 09/25/2007, 11/04/2009, 09/22/2010   Influenza,inj,Quad PF,6+ Mos 08/09/2013, 07/22/2014, 11/17/2015, 01/04/2018, 10/03/2018, 08/28/2019, 08/05/2020, 09/15/2022   Janssen (J&J) SARS-COV-2 Vaccination 05/20/2020   Pneumococcal Conjugate-13 04/25/2018   Pneumococcal Polysaccharide-23 07/31/2009, 07/05/2016   Td 03/26/2009    TDAP status: Due, Education has been provided regarding the importance of this vaccine. Advised may receive this vaccine at local pharmacy or Health Dept. Aware to provide a copy of the vaccination record if obtained from local pharmacy or Health Dept. Verbalized acceptance and understanding.  Flu Vaccine status: Up to date   Covid-19 vaccine status: Completed vaccines  Qualifies for Shingles Vaccine? Yes   Zostavax completed No   Shingrix Completed?: No.    Education has been provided regarding the importance of this vaccine. Patient has been advised to call insurance company to determine out of pocket expense if they have not yet received this vaccine. Advised may also receive vaccine at local pharmacy or Health Dept. Verbalized acceptance and understanding.  Screening Tests Health Maintenance  Topic Date Due   OPHTHALMOLOGY EXAM  Never done   Zoster Vaccines- Shingrix (1 of 2) Never done   Diabetic kidney evaluation - Urine ACR  01/06/2019   DTaP/Tdap/Td (2 - Tdap) 03/27/2019   COVID-19 Vaccine (2 - 2023-24 season) 07/30/2022   HEMOGLOBIN A1C  10/23/2022   PAP SMEAR-Modifier  10/29/2022   Diabetic kidney evaluation - eGFR measurement  04/23/2023   FOOT EXAM  09/20/2023   Medicare Annual Wellness (AWV)  11/16/2023   MAMMOGRAM  04/30/2024   COLONOSCOPY (Pts 45-19yr Insurance coverage will need to be confirmed)  10/08/2032   INFLUENZA VACCINE  Completed   Hepatitis C Screening  Completed   HIV Screening  Completed   HPV VACCINES  Aged Out    Health  Maintenance  Health Maintenance Due  Topic Date Due   OPHTHALMOLOGY EXAM  Never done   Zoster Vaccines- Shingrix (1 of 2) Never done   Diabetic kidney evaluation - Urine ACR  01/06/2019   DTaP/Tdap/Td (2 - Tdap) 03/27/2019   COVID-19 Vaccine (2 - 2023-24 season) 07/30/2022   HEMOGLOBIN A1C  10/23/2022   PAP SMEAR-Modifier  10/29/2022    Colorectal cancer screening: Type of screening: Colonoscopy. Completed 10/08/22. Repeat every 10 years  Mammogram status: Completed 04/30/22. Repeat every year   Lung Cancer Screening: (Low Dose CT Chest recommended if Age 61-80years, 30 pack-year currently smoking OR have quit w/in 15years.) does not qualify.   Lung Cancer Screening Referral: No  Additional Screening:  Hepatitis C Screening: does qualify; Completed 04/22/22  Vision Screening: Recommended annual ophthalmology exams for early detection of glaucoma and other disorders of the eye. Is the patient up to date with their annual eye exam?  No  Who is the provider or what is the name of the office in which the patient attends  annual eye exams? N/a If pt is not established with a provider, would they like to be referred to a provider to establish care? No .   Dental Screening: Recommended annual dental exams for proper oral hygiene  Community Resource Referral / Chronic Care Management: CRR required this visit?  No   CCM required this visit?  No      Plan:     I have personally reviewed and noted the following in the patient's chart:   Medical and social history Use of alcohol, tobacco or illicit drugs  Current medications and supplements including opioid prescriptions. Patient is not currently taking opioid prescriptions. Functional ability and status Nutritional status Physical activity Advanced directives List of other physicians Hospitalizations, surgeries, and ER visits in previous 12 months Vitals Screenings to include cognitive, depression, and falls Referrals and  appointments  In addition, I have reviewed and discussed with patient certain preventive protocols, quality metrics, and best practice recommendations. A written personalized care plan for preventive services as well as general preventive health recommendations were provided to patient.     Quentin Angst, [place]   11/15/2022

## 2022-11-15 NOTE — Patient Instructions (Signed)
  Leslie Lyons , Thank you for taking time to come for your Medicare Wellness Visit. I appreciate your ongoing commitment to your health goals. Please review the following plan we discussed and let me know if I can assist you in the future.   These are the goals we discussed:  Goals       Patient Stated      Would like to get back to the old [name] she cant remember like she used to       Patient Stated      Be more like her old self,more active .      Patient Stated (pt-stated)      Live a better life, lose weight, feel better.        This is a list of the screening recommended for you and due dates:  Health Maintenance  Topic Date Due   Eye exam for diabetics  Never done   Zoster (Shingles) Vaccine (1 of 2) Never done   Yearly kidney health urinalysis for diabetes  01/06/2019   DTaP/Tdap/Td vaccine (2 - Tdap) 03/27/2019   COVID-19 Vaccine (2 - 2023-24 season) 07/30/2022   Hemoglobin A1C  10/23/2022   Pap Smear  10/29/2022   Yearly kidney function blood test for diabetes  04/23/2023   Complete foot exam   09/20/2023   Medicare Annual Wellness Visit  11/16/2023   Mammogram  04/30/2024   Colon Cancer Screening  10/08/2032   Flu Shot  Completed   Hepatitis C Screening: USPSTF Recommendation to screen - Ages 18-79 yo.  Completed   HIV Screening  Completed   HPV Vaccine  Aged Out

## 2022-12-21 ENCOUNTER — Encounter: Payer: Self-pay | Admitting: Nurse Practitioner

## 2022-12-21 ENCOUNTER — Ambulatory Visit: Payer: Medicare Other | Admitting: Nurse Practitioner

## 2022-12-21 VITALS — BP 140/82 | HR 92 | Ht 63.0 in

## 2022-12-21 DIAGNOSIS — E1165 Type 2 diabetes mellitus with hyperglycemia: Secondary | ICD-10-CM

## 2022-12-21 DIAGNOSIS — E782 Mixed hyperlipidemia: Secondary | ICD-10-CM

## 2022-12-21 DIAGNOSIS — I10 Essential (primary) hypertension: Secondary | ICD-10-CM | POA: Diagnosis not present

## 2022-12-21 LAB — POCT GLYCOSYLATED HEMOGLOBIN (HGB A1C): Hemoglobin A1C: 7.3 % — AB (ref 4.0–5.6)

## 2022-12-21 NOTE — Progress Notes (Signed)
Endocrinology Follow Up Note       12/21/2022, 4:36 PM   Subjective:    Patient ID: Leslie Lyons, female    DOB: Jul 20, 1961.  Leslie Lyons is being seen in follow up after being seen in consultation for management of currently uncontrolled symptomatic diabetes requested by  [name], MD.   Past Medical History:  Diagnosis Date   Abnormal transaminases    Anxiety    Bladder incontinence    Bronchial asthma    with acute exacerbation   Cancer ([place]) 1980   cervical, diag  at age 26   Depression    Diabetes ([place])    Diabetes mellitus, type II ([place])    Headache(784.0)    with reduced vision in right eye for one month    Hyperlipidemia    Hypertension    Obesity     Past Surgical History:  Procedure Laterality Date   ABDOMINAL HYSTERECTOMY     COLONOSCOPY WITH ESOPHAGOGASTRODUODENOSCOPY (EGD)  11/2008   Dr. Arnoldo Morale: Conscious sedation.  Hiatal hernia.  Hemorrhoids. PATIENT REPORTS BEING AWAKE DURING PROCEDURE   COLONOSCOPY WITH PROPOFOL N/A 10/08/2022   Procedure: COLONOSCOPY WITH PROPOFOL;  Surgeon: Eloise Harman, DO;  Location: [facility];  Service: Endoscopy;  Laterality: N/A;  7:30 AM   CRYOTHERAPY     for abnormal pap   HEMORRHOID SURGERY     dr. Irving Shows   PARTIAL HYSTERECTOMY     POLYPECTOMY  10/08/2022   Procedure: POLYPECTOMY;  Surgeon: Eloise Harman, DO;  Location: [facility];  Service: Endoscopy;;    Social History   Socioeconomic History   Marital status: Married    Spouse name: Not on file   Number of children: 2   Years of education: Not on file   Highest education level: Not on file  Occupational History   Occupation: homemaker   Tobacco Use   Smoking status: Former    Packs/day: 0.25    Years: 2.00    Total pack years: 0.50    Types: Cigarettes    Quit date: 12/04/2015    Years since quitting: 7.0   Smokeless tobacco: Never  Vaping Use    Vaping Use: Never used  Substance and Sexual Activity   Alcohol use: No   Drug use: No   Sexual activity: Yes  Other Topics Concern   Not on file  Social History Narrative   Lives at home with husband    Social Determinants of Health   Financial Resource Strain: Low Risk  (11/15/2022)   Overall Financial Resource Strain (CARDIA)    Difficulty of Paying Living Expenses: Not hard at all  Food Insecurity: No Food Insecurity (11/15/2022)   Hunger Vital Sign    Worried About Running Out of Food in the Last Year: Never true    [place] in the Last Year: Never true  Transportation Needs: No Transportation Needs (11/15/2022)   PRAPARE - Hydrologist (Medical): No    Lack of Transportation (Non-Medical): No  Physical Activity: Insufficiently Active (11/15/2022)   Exercise Vital Sign    Days of Exercise per Week: 4 days  Minutes of Exercise per Session: 30 min  Stress: No Stress Concern Present (11/15/2022)   [place]    Feeling of Stress : Not at all  Social Connections: Socially Isolated (11/15/2022)   Social Connection and Isolation Panel [NHANES]    Frequency of Communication with Friends and Family: More than three times a week    Frequency of Social Gatherings with Friends and Family: More than three times a week    Attends Religious Services: Never    Marine scientist or Organizations: No    Attends Archivist Meetings: Never    Marital Status: Separated    Family History  Problem Relation Age of Onset   Cancer Mother        lung    COPD Father        emphysema   Hypertension Father    Cancer Maternal Aunt        breast   Cancer Paternal Uncle    Colon cancer Neg Hx     Outpatient Encounter Medications as of 12/21/2022  Medication Sig   albuterol (PROAIR HFA) 108 (90 Base) MCG/ACT inhaler Inhale 2 puffs into the lungs every 6 (six) hours as  needed.   amLODipine (NORVASC) 10 MG tablet Take 10 mg by mouth daily.   blood glucose meter kit and supplies Dispense based on patient and insurance preference. Test blood sugar once daily before breakfast. (FOR ICD-10 E10.9, E11.9).   Blood Glucose Monitoring Suppl (ONETOUCH VERIO FLEX SYSTEM) w/Device KIT Use to monitor glucose twice daily   ezetimibe (ZETIA) 10 MG tablet Take 1 tablet (10 mg total) by mouth daily.   glucose blood (ONETOUCH VERIO) test strip Use as instructed to measure glucose twice daily   insulin degludec (TRESIBA FLEXTOUCH) 100 UNIT/ML FlexTouch Pen Inject 24 Units into the skin at bedtime.   Insulin Pen Needle (PEN NEEDLES) 32G X 4 MM MISC Use to inject insulin once daily   Lancets (ONETOUCH ULTRASOFT) lancets Use as instructed to monitor glucose twice daily   losartan (COZAAR) 100 MG tablet Take 1 tablet (100 mg total) by mouth daily.   mirabegron ER (MYRBETRIQ) 50 MG TB24 tablet Take 1 tablet (50 mg total) by mouth daily.   mometasone-formoterol (DULERA) 200-5 MCG/ACT AERO Inhale 2 puffs into the lungs 2 (two) times daily.   ciprofloxacin-dexamethasone (CIPRODEX) OTIC suspension Place 4 drops into the right ear 2 (two) times daily. (Patient not taking: Reported on 12/21/2022)   diphenoxylate-atropine (LOMOTIL) 2.5-0.025 MG tablet Take one tablet by mouth three times daily , as needed, for loose stool (Patient not taking: Reported on 12/21/2022)   glipiZIDE (GLUCOTROL XL) 5 MG 24 hr tablet Take 1 tablet (5 mg total) by mouth daily with breakfast.   No facility-administered encounter medications on file as of 12/21/2022.    ALLERGIES: Allergies  Allergen Reactions   Janumet [Sitagliptin-Metformin Hcl]     States she vomits up the whole pill after taking this   Robaxin [Methocarbamol] Swelling    VACCINATION STATUS: Immunization History  Administered Date(s) Administered   Influenza Whole 09/25/2007, 11/04/2009, 09/22/2010   Influenza,inj,Quad PF,6+ Mos 08/09/2013,  07/22/2014, 11/17/2015, 01/04/2018, 10/03/2018, 08/28/2019, 08/05/2020, 09/15/2022   Janssen (J&J) SARS-COV-2 Vaccination 05/20/2020   Pneumococcal Conjugate-13 04/25/2018   Pneumococcal Polysaccharide-23 07/31/2009, 07/05/2016   Td 03/26/2009    Diabetes She presents for her follow-up diabetic visit. She has type 2 diabetes mellitus. Onset time: Diagnosed at approx age of 66. Her  disease course has been improving. There are no hypoglycemic associated symptoms. There are no diabetic associated symptoms. There are no hypoglycemic complications. Diabetic complications include peripheral neuropathy. Risk factors for coronary artery disease include diabetes mellitus, dyslipidemia, family history, obesity, hypertension, sedentary lifestyle and post-menopausal. Current diabetic treatment includes oral agent (monotherapy) and insulin injections. She is compliant with treatment most of the time. Her weight is fluctuating minimally. She is following a generally healthy diet. When asked about meal planning, she reported none. She has had a previous visit with a dietitian (went to DM class previously). Her home blood glucose trend is decreasing steadily. Her breakfast blood glucose range is generally 140-180 mg/dl. Her bedtime blood glucose range is generally 140-180 mg/dl. (She presents today with her logs showing at goal glycemic profile overall.  She admits she was out of her insulin for quite some time due to price so she was shocked when her A1c today was 7.3%, improving from last visit of 7.4%.  She has been having trouble getting insurance to cover her meds adequately.) An ACE inhibitor/angiotensin II receptor blocker is being taken. She sees a podiatrist.Eye exam is not current.     Review of systems  Constitutional: + stable body weight, current Body mass index is 42.49 kg/m., + fatigue-improving, no subjective hyperthermia, no subjective hypothermia Eyes: + blurry vision, no xerophthalmia ENT: no  sore throat, no nodules palpated in throat, no dysphagia/odynophagia, no hoarseness Cardiovascular: no chest pain, no shortness of breath, no palpitations, no leg swelling Respiratory: no cough, no shortness of breath Gastrointestinal: no nausea/vomiting/diarrhea Musculoskeletal: no muscle/joint aches, walks with cane Skin: no rashes, no hyperemia Neurological: no tremors, no numbness, no tingling, no dizziness Psychiatric: no depression, no anxiety  Objective:     BP (!) 140/82   Pulse 92   Ht '5\' 3"'$  (1.6 m)   BMI 42.49 kg/m   Wt Readings from Last 3 Encounters:  10/08/22 239 lb 13.8 oz (108.8 kg)  10/06/22 (P) 240 lb (108.9 kg)  09/21/22 240 lb (108.9 kg)     BP Readings from Last 3 Encounters:  12/21/22 (!) 140/82  10/08/22 133/72  09/21/22 (!) 162/85      Physical Exam- Limited  Constitutional:  Body mass index is 42.49 kg/m. , not in acute distress, normal state of mind Eyes:  EOMI, no exophthalmos Neck: Supple Musculoskeletal: no gross deformities, strength intact in all four extremities, no gross restriction of joint movements, walks with cane Skin:  no rashes, no hyperemia Neurological: no tremor with outstretched hands  Diabetic Foot Exam - Simple   No data filed     CMP ( most recent) CMP     Component Value Date/Time   NA 142 10/08/2022 1001   NA 135 04/22/2022 1044   K 3.8 10/08/2022 1001   CL 101 10/08/2022 1001   CO2 24 04/22/2022 1044   GLUCOSE 128 (H) 10/08/2022 1001   BUN 9 10/08/2022 1001   BUN 9 04/22/2022 1044   CREATININE 0.80 10/08/2022 1001   CREATININE 1.56 (H) 01/07/2020 0913   CALCIUM 10.1 04/22/2022 1044   PROT 8.3 04/22/2022 1044   ALBUMIN 4.8 04/22/2022 1044   AST 87 (H) 04/22/2022 1044   ALT 55 (H) 04/22/2022 1044   ALKPHOS 115 04/22/2022 1044   BILITOT 0.3 04/22/2022 1044   GFRNONAA >60 03/09/2021 0944   GFRNONAA 69 07/13/2019 0816   GFRAA >60 08/05/2020 1107   GFRAA 80 07/13/2019 0816     Diabetic Labs (most  recent):  Lab Results  Component Value Date   HGBA1C 7.3 (A) 12/21/2022   HGBA1C 12.4 (A) 04/22/2022   HGBA1C 6.9 (H) 06/30/2021   MICROALBUR 22.2 (H) 08/05/2020   MICROALBUR 8.6 03/27/2019   MICROALBUR 1.9 01/06/2018     Lipid Panel ( most recent) Lipid Panel     Component Value Date/Time   CHOL 150 04/22/2022 1044   TRIG 129 04/22/2022 1044   HDL 55 04/22/2022 1044   CHOLHDL 2.7 04/22/2022 1044   CHOLHDL 2.9 08/05/2020 1107   VLDL 25 08/05/2020 1107   LDLCALC 72 04/22/2022 1044   LDLCALC 169 (H) 07/13/2019 0816   LABVLDL 23 04/22/2022 1044      Lab Results  Component Value Date   TSH 1.780 04/22/2022   TSH 2.630 06/30/2021   TSH 1.426 08/05/2020   TSH 0.869 04/22/2020   TSH 3.56 03/27/2019   TSH 1.80 01/04/2018   TSH 2.75 05/12/2016   TSH 1.915 07/05/2013   TSH 0.829 06/25/2011   TSH 1.159 09/28/2010        Assessment & Plan:   1) Type 2 diabetes mellitus with hyperglycemia, without long-term current use of insulin ([place])  She presents today with her logs showing at goal glycemic profile overall.  She admits she was out of her insulin for quite some time due to price so she was shocked when her A1c today was 7.3%, improving from last visit of 7.4%.  She has been having trouble getting insurance to cover her meds adequately.  - Leslie Lyons has currently uncontrolled symptomatic type 2 DM since 62 years of age.   -Recent labs reviewed.  - I had a long discussion with her about the progressive nature of diabetes and the pathology behind its complications. -her diabetes is complicated by neuropathy and she remains at a high risk for more acute and chronic complications which include CAD, CVA, CKD, retinopathy, and neuropathy. These are all discussed in detail with her.  The following Lifestyle Medicine recommendations according to [facility]) were discussed and offered to patient and she agrees to start the journey:  A. Whole  Foods, Plant-based plate comprising of fruits and vegetables, plant-based proteins, whole-grain carbohydrates was discussed in detail with the patient.   A list for source of those nutrients were also provided to the patient.  Patient will use only water or unsweetened tea for hydration. B.  The need to stay away from risky substances including alcohol, smoking; obtaining 7 to 9 hours of restorative sleep, at least 150 minutes of moderate intensity exercise weekly, the importance of healthy social connections,  and stress reduction techniques were discussed. C.  A full color page of  Calorie density of various food groups per pound showing examples of each food groups was provided to the patient.  - Nutritional counseling repeated at each appointment due to patients tendency to fall back in to old habits.  - The patient admits there is a room for improvement in their diet and drink choices. -  Suggestion is made for the patient to avoid simple carbohydrates from their diet including Cakes, Sweet Desserts / Pastries, Ice Cream, Soda (diet and regular), Sweet Tea, Candies, Chips, Cookies, Sweet Pastries, Store Bought Juices, Alcohol in Excess of 1-2 drinks a day, Artificial Sweeteners, Coffee Creamer, and "Sugar-free" Products. This will help patient to have stable blood glucose profile and potentially avoid unintended weight gain.   - I encouraged the patient to switch to unprocessed or minimally processed  complex starch and increased protein intake (animal or plant source), fruits, and vegetables.   - Patient is advised to stick to a routine mealtimes to eat 3 meals a day and avoid unnecessary snacks (to snack only to correct hypoglycemia).  - I have approached her with the following individualized plan to manage her diabetes and patient agrees:   -She is advised to lower [place] to 20 units SQ nightly and Glipizide 5 mg XL daily with breakfast.  Her ultimate goal is to stop the insulin in the future.   I did give her sample pens of Tyler Aas today to help her conserve costs.  I also printed out patient assistance form for her as well.  -she is encouraged to continue monitoring blood glucose twice daily, before breakfast and before bed, and to call the clinic if he has readings less than 70 or above 300 for 3 tests in a row.   - Adjustment parameters are given to her for hypo and hyperglycemia in writing.  -She did not tolerate Janumet in the past (severe nausea with vomiting).  - she will be considered for incretin therapy as appropriate next visit (concerned with cost-therefore will hold off on this for now).  - Specific targets for  A1c; LDL, HDL, and Triglycerides were discussed with the patient.  2) Blood Pressure /Hypertension:  her blood pressure is controlled to target.   she is advised to continue her current medications including Losartan 50 mg p.o. daily with breakfast.  3) Lipids/Hyperlipidemia:    Review of her recent lipid panel from 04/22/22 showed controlled LDL at 72 .  She is not currently on any lipid lowering medications.   4)  Weight/Diet:  her Body mass index is 42.49 kg/m.  -  clearly complicating her diabetes care.   she is a candidate for weight loss. I discussed with her the fact that loss of 5 - 10% of her  current body weight will have the most impact on her diabetes management.  Exercise, and detailed carbohydrates information provided  -  detailed on discharge instructions.  5) Chronic Care/Health Maintenance: -she is on ACEI/ARB and not on Statin medications and is encouraged to initiate and continue to follow up with Ophthalmology, Dentist, Podiatrist at least yearly or according to recommendations, and advised to stay away from smoking. I have recommended yearly flu vaccine and pneumonia vaccine at least every 5 years; moderate intensity exercise for up to 150 minutes weekly; and sleep for at least 7 hours a day.  - she is advised to maintain close follow up  with [name], MD for primary care needs, as well as her other providers for optimal and coordinated care.      I spent 33 minutes in the care of the patient today including review of labs from [facility], Lipids, Thyroid Function, Hematology (current and previous including abstractions from other facilities); face-to-face time discussing  her blood glucose readings/logs, discussing hypoglycemia and hyperglycemia episodes and symptoms, medications doses, her options of short and long term treatment based on the latest standards of care / guidelines;  discussion about incorporating lifestyle medicine;  and documenting the encounter. Risk reduction counseling performed per USPSTF guidelines to reduce obesity and cardiovascular risk factors.     Please refer to Patient Instructions for Blood Glucose Monitoring and Insulin/Medications Dosing Guide"  in media tab for additional information. Please  also refer to " Patient Self Inventory" in the Media  tab for reviewed elements of pertinent patient history.  Leslie Lyons participated in the discussions, expressed understanding, and voiced agreement with the above plans.  All questions were answered to her satisfaction. she is encouraged to contact clinic should she have any questions or concerns prior to her return visit.     Follow up plan: - Return in about 4 months (around 04/21/2023) for Diabetes F/U with A1c in office, No previsit labs, Bring meter and logs.   Rayetta Pigg, [facility] [address] [place], [place] [address] Phone: [phone] Fax: [phone]  12/21/2022, 4:36 PM

## 2022-12-28 ENCOUNTER — Other Ambulatory Visit: Payer: Self-pay | Admitting: Family Medicine

## 2023-01-04 ENCOUNTER — Encounter: Payer: Medicare Other | Admitting: Family Medicine

## 2023-02-04 ENCOUNTER — Ambulatory Visit (INDEPENDENT_AMBULATORY_CARE_PROVIDER_SITE_OTHER): Payer: Medicare Other | Admitting: Family Medicine

## 2023-02-04 ENCOUNTER — Other Ambulatory Visit (HOSPITAL_COMMUNITY)
Admission: RE | Admit: 2023-02-04 | Discharge: 2023-02-04 | Disposition: A | Discharge status: Home / Self Care | Payer: Medicare Other | Source: Ambulatory Visit | Attending: Family Medicine | Admitting: Family Medicine

## 2023-02-04 ENCOUNTER — Encounter: Payer: Self-pay | Admitting: Family Medicine

## 2023-02-04 VITALS — BP 153/92 | HR 106 | Resp 12 | Ht 63.0 in | Wt 251.1 lb

## 2023-02-04 DIAGNOSIS — Z0001 Encounter for general adult medical examination with abnormal findings: Secondary | ICD-10-CM | POA: Diagnosis not present

## 2023-02-04 DIAGNOSIS — Z1151 Encounter for screening for human papillomavirus (HPV): Secondary | ICD-10-CM | POA: Diagnosis not present

## 2023-02-04 DIAGNOSIS — R0683 Snoring: Secondary | ICD-10-CM

## 2023-02-04 DIAGNOSIS — E1122 Type 2 diabetes mellitus with diabetic chronic kidney disease: Secondary | ICD-10-CM | POA: Diagnosis not present

## 2023-02-04 DIAGNOSIS — I10 Essential (primary) hypertension: Secondary | ICD-10-CM

## 2023-02-04 DIAGNOSIS — Z0000 Encounter for general adult medical examination without abnormal findings: Secondary | ICD-10-CM | POA: Insufficient documentation

## 2023-02-04 DIAGNOSIS — N182 Chronic kidney disease, stage 2 (mild): Secondary | ICD-10-CM | POA: Diagnosis not present

## 2023-02-04 DIAGNOSIS — E559 Vitamin D deficiency, unspecified: Secondary | ICD-10-CM | POA: Diagnosis not present

## 2023-02-04 DIAGNOSIS — E1165 Type 2 diabetes mellitus with hyperglycemia: Secondary | ICD-10-CM

## 2023-02-04 DIAGNOSIS — Z9189 Other specified personal risk factors, not elsewhere classified: Secondary | ICD-10-CM | POA: Diagnosis not present

## 2023-02-04 DIAGNOSIS — E782 Mixed hyperlipidemia: Secondary | ICD-10-CM

## 2023-02-04 DIAGNOSIS — K760 Fatty (change of) liver, not elsewhere classified: Secondary | ICD-10-CM | POA: Diagnosis not present

## 2023-02-04 DIAGNOSIS — E049 Nontoxic goiter, unspecified: Secondary | ICD-10-CM | POA: Diagnosis not present

## 2023-02-04 DIAGNOSIS — Z124 Encounter for screening for malignant neoplasm of cervix: Secondary | ICD-10-CM | POA: Diagnosis not present

## 2023-02-04 DIAGNOSIS — Z01419 Encounter for gynecological examination (general) (routine) without abnormal findings: Secondary | ICD-10-CM | POA: Diagnosis present

## 2023-02-04 DIAGNOSIS — Z794 Long term (current) use of insulin: Secondary | ICD-10-CM

## 2023-02-04 DIAGNOSIS — R7989 Other specified abnormal findings of blood chemistry: Secondary | ICD-10-CM

## 2023-02-04 DIAGNOSIS — R748 Abnormal levels of other serum enzymes: Secondary | ICD-10-CM

## 2023-02-04 MED ORDER — AMLODIPINE BESYLATE 10 MG PO TABS: 10.0000 mg | ORAL_TABLET | Freq: Every day | ORAL | 3 refills | Status: DC

## 2023-02-04 NOTE — Patient Instructions (Addendum)
F/u in 4 to 6 weeks re evaluate blood pressure,   New additional medication for BP is amlodipine 10 mg one daily, continue cozaar as before  Pls get  fasting lipid, cmp and EGFr, microalb, TSH, free t3 and free T4 next Monday and vit D and CBC  You will be referred for [place] of  your thyroid gland  You will be referred to Cardiology and pulmonary specialists  You will be referred for eye exam  It is important that you exercise regularly at least 30 minutes 5 times a week. If you develop chest pain, have severe difficulty breathing, or feel very tired, stop exercising immediately and seek medical attention  Think about what you will eat, plan ahead. Choose " clean, green, fresh or frozen" over canned, processed or packaged foods which are more sugary, salty and fatty. 70 to 75% of food eaten should be vegetables and fruit. Three meals at set times with snacks allowed between meals, but they must be fruit or vegetables. Aim to eat over a 12 hour period , example 7 am to 7 pm, and STOP after  your last meal of the day. Drink water,generally about 64 ounces per day, no other drink is as healthy. Fruit juice is best enjoyed in a healthy way, by EATING the fruit. Thanks for choosing [facility], we consider it a privelige to serve you.

## 2023-02-07 DIAGNOSIS — R748 Abnormal levels of other serum enzymes: Secondary | ICD-10-CM | POA: Diagnosis not present

## 2023-02-07 DIAGNOSIS — K760 Fatty (change of) liver, not elsewhere classified: Secondary | ICD-10-CM | POA: Diagnosis not present

## 2023-02-07 DIAGNOSIS — E559 Vitamin D deficiency, unspecified: Secondary | ICD-10-CM | POA: Diagnosis not present

## 2023-02-07 DIAGNOSIS — E782 Mixed hyperlipidemia: Secondary | ICD-10-CM | POA: Diagnosis not present

## 2023-02-07 DIAGNOSIS — Z0000 Encounter for general adult medical examination without abnormal findings: Secondary | ICD-10-CM | POA: Diagnosis not present

## 2023-02-09 LAB — LIPID PANEL
Chol/HDL Ratio: 3.9 ratio (ref 0.0–4.4)
Cholesterol, Total: 262 mg/dL — ABNORMAL HIGH (ref 100–199)
HDL: 68 mg/dL (ref >39)
LDL Chol Calc (NIH): 148 mg/dL — ABNORMAL HIGH (ref 0–99)
Triglycerides: 257 mg/dL — ABNORMAL HIGH (ref 0–149)
VLDL Cholesterol Cal: 46 mg/dL — ABNORMAL HIGH (ref 5–40)

## 2023-02-09 LAB — CMP14+EGFR
ALT: 25 IU/L (ref 0–32)
AST: 28 IU/L (ref 0–40)
Albumin/Globulin Ratio: 1.4 (ref 1.2–2.2)
Albumin: 4.9 g/dL (ref 3.9–4.9)
Alkaline Phosphatase: 108 IU/L (ref 44–121)
BUN/Creatinine Ratio: 12 (ref 12–28)
BUN: 10 mg/dL (ref 8–27)
Bilirubin Total: 0.4 mg/dL (ref 0.0–1.2)
CO2: 26 mmol/L (ref 20–29)
Calcium: 10.2 mg/dL (ref 8.7–10.3)
Chloride: 93 mmol/L — ABNORMAL LOW (ref 96–106)
Creatinine, Ser: 0.86 mg/dL (ref 0.57–1.00)
Globulin, Total: 3.4 g/dL (ref 1.5–4.5)
Glucose: 134 mg/dL — ABNORMAL HIGH (ref 70–99)
Potassium: 4.3 mmol/L (ref 3.5–5.2)
Sodium: 140 mmol/L (ref 134–144)
Total Protein: 8.3 g/dL (ref 6.0–8.5)
eGFR: 76 mL/min/{1.73_m2} (ref >59)

## 2023-02-09 LAB — CBC
Hematocrit: 42.8 % (ref 34.0–46.6)
Hemoglobin: 13.9 g/dL (ref 11.1–15.9)
MCH: 27.6 pg (ref 26.6–33.0)
MCHC: 32.5 g/dL (ref 31.5–35.7)
MCV: 85 fL (ref 79–97)
Platelets: 166 10*3/uL (ref 150–450)
RBC: 5.04 x10E6/uL (ref 3.77–5.28)
RDW: 12.8 % (ref 11.7–15.4)
WBC: 11.1 10*3/uL — ABNORMAL HIGH (ref 3.4–10.8)

## 2023-02-09 LAB — T3, FREE: T3, Free: 3.2 pg/mL (ref 2.0–4.4)

## 2023-02-09 LAB — MICROALBUMIN, URINE: Microalbumin, Urine: 16.4 ug/mL

## 2023-02-09 LAB — TSH: TSH: 2.73 u[IU]/mL (ref 0.450–4.500)

## 2023-02-09 LAB — T4, FREE: Free T4: 1.22 ng/dL (ref 0.82–1.77)

## 2023-02-09 LAB — VITAMIN D 25 HYDROXY (VIT D DEFICIENCY, FRACTURES): Vit D, 25-Hydroxy: 45.6 ng/mL (ref 30.0–100.0)

## 2023-02-10 ENCOUNTER — Encounter: Payer: Self-pay | Admitting: Family Medicine

## 2023-02-10 ENCOUNTER — Other Ambulatory Visit: Payer: Self-pay

## 2023-02-10 DIAGNOSIS — E049 Nontoxic goiter, unspecified: Secondary | ICD-10-CM | POA: Insufficient documentation

## 2023-02-10 DIAGNOSIS — R0683 Snoring: Secondary | ICD-10-CM | POA: Insufficient documentation

## 2023-02-10 DIAGNOSIS — E782 Mixed hyperlipidemia: Secondary | ICD-10-CM

## 2023-02-10 DIAGNOSIS — Z9189 Other specified personal risk factors, not elsewhere classified: Secondary | ICD-10-CM | POA: Insufficient documentation

## 2023-02-10 DIAGNOSIS — E1165 Type 2 diabetes mellitus with hyperglycemia: Secondary | ICD-10-CM

## 2023-02-10 DIAGNOSIS — Z0001 Encounter for general adult medical examination with abnormal findings: Secondary | ICD-10-CM | POA: Insufficient documentation

## 2023-02-10 MED ORDER — FENOFIBRATE 145 MG PO TABS: 145.0000 mg | ORAL_TABLET | Freq: Every day | ORAL | 4 refills | Status: DC

## 2023-02-10 MED ORDER — ROSUVASTATIN CALCIUM 40 MG PO TABS: 40.0000 mg | ORAL_TABLET | Freq: Every day | ORAL | 4 refills | Status: DC

## 2023-02-10 NOTE — Progress Notes (Signed)
Leslie Lyons     MRN: [id number]      DOB: 26-Mar-1961  HPI: Patient is in for annual physical exam. No other health concerns are expressed or addressed at the visit. Labs to be updated and will be reviewed when available Uncontrolled BP is addressed Rept thyroid US to be ordered  Has h/o longstanding HTn and diabetes with inc cardiovasc risk, chronic fatigue and snoring, referrals to Specialists Immunization is reviewed , and  updated if needed.   PE: BP (!) 153/92   Pulse (!) 106   Resp 12   Ht '5\' 3"'$  (1.6 m)   Wt 251 lb 1 oz (113.9 kg)   SpO2 96%   BMI 44.47 kg/m   Pleasant  female, alert and oriented x 3, in mild cardio-pulmonary distress. Afebrile. HEENT No facial trauma or asymetry. Sinuses non tender.  Extra occullar muscles intact.. External ears normal, . Neck: supple, no adenopathy,JVD has  thyromegaly.No bruits.  Chest: Clear to ascultation bilaterally.No crackles or wheezes. Non tender to palpation  Breast: Not examined  Cardiovascular system; Heart sounds normal,  S1 and  S2 ,no S3.  No murmur, or thrill. Apical beat not displaced Peripheral pulses normal.  Abdomen: Soft, non tender, no organomegaly or masses. No bruits. Bowel sounds normal. No guarding, tenderness or rebound.   GU: External genitalia normal female genitalia , normal female distribution of hair. No lesions. Urethral meatus normal in size, no  Prolapse, no lesions visibly  Present. Bladder non tender. Vagina pink and moist , with no visible lesions , discharge present . Adequate pelvic support no  cystocele or rectocele noted Cervix absent, Uterus absent, no adnexal masses, no adnexal tenderness.   Musculoskeletal exam: Decreased  ROM of spine, hips , shoulders and knees.  deformity ,swelling and  crepitus noted. No muscle wasting or atrophy.   Neurologic: Cranial nerves 2 to 12 intact. Power, tone ,sensation and reflexes normal throughout. disturbance in gait. No  tremor.  Skin: Intact, no ulceration, erythema , scaling or rash noted. Pigmentation normal throughout  Psych; Normal mood and affect.    Assessment & Plan:  Encounter for Medicare annual examination with abnormal findings Annual exam as documented. Counseling done  re healthy lifestyle involving commitment to 150 minutes exercise per week, heart healthy diet, and attaining healthy weight.The importance of adequate sleep also discussed. Regular seat belt use and home safety, is also discussed. Changes in health habits are decided on by the patient with goals and time frames  set for achieving them. Immunization and cancer screening needs are specifically addressed at this visit.   Essential hypertension, benign Uncontrolled inc amlodipine and re eval in 6 to 8 weeks DASH diet and commitment to daily physical activity for a minimum of 30 minutes discussed and encouraged, as a part of hypertension management. The importance of attaining a healthy weight is also discussed.     02/04/2023    3:40 PM 12/21/2022    4:11 PM 12/21/2022    4:05 PM 10/08/2022   10:39 AM 10/08/2022   10:00 AM 10/06/2022    1:38 PM 09/21/2022    3:32 PM  BP/Weight  Systolic BP 0000000 [id number] 0000000 [id number] 0000000  0000000  Diastolic BP 92 82 77 72 89  85  Wt. (Lbs) 251.06    239.86 240 240  BMI 44.47 kg/m2    42.49 kg/m2 42.51 kg/m2 42.51 kg/m2       Type 2 diabetes mellitus with stage 2 chronic kidney disease,  with long-term current use of insulin ([place]) Leslie Lyons is reminded of the importance of commitment to daily physical activity for 30 minutes or more, as able and the need to limit carbohydrate intake to 30 to 60 grams per meal to help with blood sugar control.   The need to take medication as prescribed, test blood sugar as directed, and to call between visits if there is a concern that blood sugar is uncontrolled is also discussed.   Leslie Lyons is reminded of the importance of daily foot exam, annual eye  examination, and good blood sugar, blood pressure and cholesterol control.     Latest Ref Rng & Units 02/07/2023    8:02 AM 12/21/2022    4:22 PM 10/08/2022   10:01 AM 04/22/2022   10:44 AM 04/22/2022   10:39 AM  Diabetic Labs  HbA1c 4.0 - 5.6 %  7.3    12.4   Chol 100 - 199 mg/dL 262    150    HDL >39 mg/dL 68    55    Calc LDL 0 - 99 mg/dL 148    72    Triglycerides 0 - 149 mg/dL 257    129    Creatinine 0.57 - 1.00 mg/dL 0.86   0.80  0.96        02/04/2023    3:40 PM 12/21/2022    4:11 PM 12/21/2022    4:05 PM 10/08/2022   10:39 AM 10/08/2022   10:00 AM 10/06/2022    1:38 PM 09/21/2022    3:32 PM  BP/Weight  Systolic BP 0000000 [id number] 0000000 Q000111Q 0000000  0000000  Diastolic BP 92 82 77 72 89  85  Wt. (Lbs) 251.06    239.86 240 240  BMI 44.47 kg/m2    42.49 kg/m2 42.51 kg/m2 42.51 kg/m2      08/05/2020    9:20 AM 08/28/2019    8:40 AM  Foot/eye exam completion dates  Foot Form Completion Done Done      Managed by Endo Updated lab needed at/ before next visit.   Mixed hyperlipidemia Hyperlipidemia:Low fat diet discussed and encouraged.start crestor 40 mg and fenofibrate 145 mg daily Re eval LFT in 1 month  Lipid Panel  Lab Results  Component Value Date   CHOL 262 (H) 02/07/2023   HDL 68 02/07/2023   LDLCALC 148 (H) 02/07/2023   TRIG 257 (H) 02/07/2023   CHOLHDL 3.9 02/07/2023    Uncontrolled med adjustment needed  Morbid obesity ([place])  Patient re-educated about  the importance of commitment to a  minimum of 150 minutes of exercise per week as able.  The importance of healthy food choices with portion control discussed, as well as eating regularly and within a 12 hour window most days. The need to choose "clean , green" food 50 to 75% of the time is discussed, as well as to make water the primary drink and set a goal of 64 ounces water daily.       02/04/2023    3:40 PM 12/21/2022    4:05 PM 10/08/2022   10:00 AM  Weight /BMI  Weight 251 lb 1 oz  239 lb 13.8 oz  Height 5'  3" (1.6 m) '5\' 3"'$  (1.6 m) '5\' 3"'$  (1.6 m)  BMI 44.47 kg/m2 42.49 kg/m2 42.49 kg/m2      Snoring Refer for eval for OSA  Goiter [place] to eval  At increased risk for cardiovascular disease Multiple CV risk factors needs evalby cardiology,  uncontrolled  HTN, DM, hyperlipidemia and obesty, chronic fatigue which is worsening

## 2023-02-10 NOTE — Assessment & Plan Note (Signed)
Refer for eval for OSA

## 2023-02-10 NOTE — Assessment & Plan Note (Addendum)
Hyperlipidemia:Low fat diet discussed and encouraged.start crestor 40 mg and fenofibrate 145 mg daily Re eval LFT in 1 month  Lipid Panel  Lab Results  Component Value Date   CHOL 262 (H) 02/07/2023   HDL 68 02/07/2023   LDLCALC 148 (H) 02/07/2023   TRIG 257 (H) 02/07/2023   CHOLHDL 3.9 02/07/2023    Uncontrolled med adjustment needed

## 2023-02-10 NOTE — Assessment & Plan Note (Signed)
[place] to eval

## 2023-02-10 NOTE — Assessment & Plan Note (Signed)
  Patient re-educated about  the importance of commitment to a  minimum of 150 minutes of exercise per week as able.  The importance of healthy food choices with portion control discussed, as well as eating regularly and within a 12 hour window most days. The need to choose "clean , green" food 50 to 75% of the time is discussed, as well as to make water the primary drink and set a goal of 64 ounces water daily.       02/04/2023    3:40 PM 12/21/2022    4:05 PM 10/08/2022   10:00 AM  Weight /BMI  Weight 251 lb 1 oz  239 lb 13.8 oz  Height 5\' 3"  (1.6 m) 5\' 3"  (1.6 m) 5\' 3"  (1.6 m)  BMI 44.47 kg/m2 42.49 kg/m2 42.49 kg/m2

## 2023-02-10 NOTE — Assessment & Plan Note (Signed)
Multiple CV risk factors needs evalby cardiology,  uncontrolled HTN, DM, hyperlipidemia and obesty, chronic fatigue which is worsening

## 2023-02-10 NOTE — Assessment & Plan Note (Signed)
Leslie Lyons is reminded of the importance of commitment to daily physical activity for 30 minutes or more, as able and the need to limit carbohydrate intake to 30 to 60 grams per meal to help with blood sugar control.   The need to take medication as prescribed, test blood sugar as directed, and to call between visits if there is a concern that blood sugar is uncontrolled is also discussed.   Leslie Lyons is reminded of the importance of daily foot exam, annual eye examination, and good blood sugar, blood pressure and cholesterol control.     Latest Ref Rng & Units 02/07/2023    8:02 AM 12/21/2022    4:22 PM 10/08/2022   10:01 AM 04/22/2022   10:44 AM 04/22/2022   10:39 AM  Diabetic Labs  HbA1c 4.0 - 5.6 %  7.3    12.4   Chol 100 - 199 mg/dL 262    150    HDL >39 mg/dL 68    55    Calc LDL 0 - 99 mg/dL 148    72    Triglycerides 0 - 149 mg/dL 257    129    Creatinine 0.57 - 1.00 mg/dL 0.86   0.80  0.96        02/04/2023    3:40 PM 12/21/2022    4:11 PM 12/21/2022    4:05 PM 10/08/2022   10:39 AM 10/08/2022   10:00 AM 10/06/2022    1:38 PM 09/21/2022    3:32 PM  BP/Weight  Systolic BP 0000000 [id number] 0000000 Q000111Q 0000000  0000000  Diastolic BP 92 82 77 72 89  85  Wt. (Lbs) 251.06    239.86 240 240  BMI 44.47 kg/m2    42.49 kg/m2 42.51 kg/m2 42.51 kg/m2      08/05/2020    9:20 AM 08/28/2019    8:40 AM  Foot/eye exam completion dates  Foot Form Completion Done Done      Managed by Endo Updated lab needed at/ before next visit.

## 2023-02-10 NOTE — Assessment & Plan Note (Signed)
Uncontrolled inc amlodipine and re eval in 6 to 8 weeks DASH diet and commitment to daily physical activity for a minimum of 30 minutes discussed and encouraged, as a part of hypertension management. The importance of attaining a healthy weight is also discussed.     02/04/2023    3:40 PM 12/21/2022    4:11 PM 12/21/2022    4:05 PM 10/08/2022   10:39 AM 10/08/2022   10:00 AM 10/06/2022    1:38 PM 09/21/2022    3:32 PM  BP/Weight  Systolic BP 0000000 [id number] 0000000 Q000111Q 0000000  0000000  Diastolic BP 92 82 77 72 89  85  Wt. (Lbs) 251.06    239.86 240 240  BMI 44.47 kg/m2    42.49 kg/m2 42.51 kg/m2 42.51 kg/m2

## 2023-02-10 NOTE — Assessment & Plan Note (Signed)

## 2023-02-15 LAB — CYTOLOGY - PAP
Chlamydia: NEGATIVE
Comment: NEGATIVE
Comment: NEGATIVE
Comment: NORMAL
Diagnosis: NEGATIVE
High risk HPV: NEGATIVE
Neisseria Gonorrhea: NEGATIVE

## 2023-02-22 ENCOUNTER — Ambulatory Visit (HOSPITAL_COMMUNITY): Payer: Medicare Other | Attending: Family Medicine

## 2023-03-07 ENCOUNTER — Telehealth: Payer: Self-pay | Admitting: Family Medicine

## 2023-03-07 ENCOUNTER — Telehealth: Payer: Self-pay | Admitting: *Deleted

## 2023-03-07 NOTE — Telephone Encounter (Signed)
I need at least the last 3 days worth of glucose readings (should be checking before breakfast and before bed), so I can best determine if adjustments need to be made.

## 2023-03-07 NOTE — Telephone Encounter (Signed)
Would like to discuss blood sugars and count, how they have been going up.  Patient was called and she shares with me that she saw her PCP since she saw [place].She was put on  Fenofibrate, Crestor, Amlodipine, since this time her blood sugars are running 200 and higher. She has shared this with her PCP.  I ask her about antibiotics, prednisone,ect. She denies any of this. However when I ask about her inhalers she states that she has been using them more since her last visit here and that it had been as directed. She is rotating her sites , and she has not seen any insulin running out ot the injection site.  Patient was advised that this would be sent to [facility] to address and I would call her back with any recommendations.

## 2023-03-07 NOTE — Telephone Encounter (Signed)
She states her blood sugar started going up the day she started the last meds you put her on. I advised her they were not meds for blood sugar but she said they are greatly affecting her blood sugar because she hasn't changed anything and her sugar went from pretty good to the high 200's and 300's. She wants a call back to advise what she needs to do

## 2023-03-07 NOTE — Telephone Encounter (Signed)
New message    Pt c/o medication issue:  1. Name of Medication:   amLODipine (NORVASC) 10 MG tablet  rosuvastatin (CRESTOR) 40 MG tablet  fenofibrate (TRICOR) 145 MG tablet  2. How are you currently taking this medication (dosage and times per day)?   3. Are you having a reaction (difficulty breathing--STAT)? No   4. What is your medication issue? C/o high blood sugars   This morning  217  Yesterday morning  222  - at night  230

## 2023-03-08 NOTE — Telephone Encounter (Signed)
Talked with the patient. She took her blood sugar this morning at 5 am and it  was 171 , then she checked it again at 4 pm this afternoon and it was 194. Patient is going to check again to night, I ask her to check them for the next two days and call back with those readings. They will be given to [facility] and she will see if there is a need for adjustment in her medication.

## 2023-03-08 NOTE — Telephone Encounter (Signed)
I have tried several times to call the patient. It will ring, ring then a message from operator - your call cannot go through as dialed call again later.

## 2023-03-09 NOTE — Telephone Encounter (Signed)
Patient aware.

## 2023-03-14 ENCOUNTER — Encounter: Payer: Self-pay | Admitting: Internal Medicine

## 2023-03-15 ENCOUNTER — Ambulatory Visit (INDEPENDENT_AMBULATORY_CARE_PROVIDER_SITE_OTHER): Payer: Medicare Other | Admitting: Family Medicine

## 2023-03-15 ENCOUNTER — Encounter: Payer: Self-pay | Admitting: Family Medicine

## 2023-03-15 VITALS — BP 134/84 | HR 88 | Resp 16

## 2023-03-15 DIAGNOSIS — N182 Chronic kidney disease, stage 2 (mild): Secondary | ICD-10-CM

## 2023-03-15 DIAGNOSIS — E1122 Type 2 diabetes mellitus with diabetic chronic kidney disease: Secondary | ICD-10-CM | POA: Diagnosis not present

## 2023-03-15 DIAGNOSIS — F5104 Psychophysiologic insomnia: Secondary | ICD-10-CM

## 2023-03-15 DIAGNOSIS — Z794 Long term (current) use of insulin: Secondary | ICD-10-CM

## 2023-03-15 DIAGNOSIS — I10 Essential (primary) hypertension: Secondary | ICD-10-CM

## 2023-03-15 DIAGNOSIS — E785 Hyperlipidemia, unspecified: Secondary | ICD-10-CM

## 2023-03-15 DIAGNOSIS — E1165 Type 2 diabetes mellitus with hyperglycemia: Secondary | ICD-10-CM | POA: Diagnosis not present

## 2023-03-15 DIAGNOSIS — E782 Mixed hyperlipidemia: Secondary | ICD-10-CM

## 2023-03-15 MED ORDER — TRAZODONE HCL 100 MG PO TABS: 100.0000 mg | ORAL_TABLET | Freq: Every day | ORAL | 3 refills | Status: DC

## 2023-03-15 MED ORDER — MIRABEGRON ER 50 MG PO TB24: 50.0000 mg | ORAL_TABLET | Freq: Every day | ORAL | 5 refills | Status: DC

## 2023-03-15 MED ORDER — TRAZODONE HCL 50 MG PO TABS: 25.0000 mg | ORAL_TABLET | Freq: Every evening | ORAL | 3 refills | Status: DC | PRN

## 2023-03-15 NOTE — Patient Instructions (Addendum)
F/U first week in August, re evaluate cholesterol and chronic problems, call if you need me sooner  BP is excellent, stay on cozaar 100 mg one daily only, NO amlodipine  Fasting lipid, cmp and EGFr week of July 29 and urine ACR  You need TdaP and shingrix vaccines , both are at your pharmacy  It is important that you exercise regularly at least 30 minutes 5 times a week. If you develop chest pain, have severe difficulty breathing, or feel very tired, stop exercising immediately and seek medical attention    .New for sleep is trazodone 100 mg at bedtime

## 2023-03-21 ENCOUNTER — Encounter: Payer: Self-pay | Admitting: Family Medicine

## 2023-03-21 NOTE — Assessment & Plan Note (Signed)
Hyperlipidemia:Low fat diet discussed and encouraged.   Lipid Panel  Lab Results  Component Value Date   CHOL 262 (H) 02/07/2023   HDL 68 02/07/2023   LDLCALC 148 (H) 02/07/2023   TRIG 257 (H) 02/07/2023   CHOLHDL 3.9 02/07/2023     Uncontrolled Updated lab needed at/ before next visit.

## 2023-03-21 NOTE — Assessment & Plan Note (Signed)
  Patient re-educated about  the importance of commitment to a  minimum of 150 minutes of exercise per week as able.  The importance of healthy food choices with portion control discussed, as well as eating regularly and within a 12 hour window most days. The need to choose "clean , green" food 50 to 75% of the time is discussed, as well as to make water the primary drink and set a goal of 64 ounces water daily.       02/04/2023    3:40 PM 12/21/2022    4:05 PM 10/08/2022   10:00 AM  Weight /BMI  Weight 251 lb 1 oz  239 lb 13.8 oz  Height 5' 3" (1.6 m) 5' 3" (1.6 m) 5' 3" (1.6 m)  BMI 44.47 kg/m2 42.49 kg/m2 42.49 kg/m2     

## 2023-03-21 NOTE — Assessment & Plan Note (Addendum)
Improving based  on data presented, managed by Endo Leslie Lyons is reminded of the importance of commitment to daily physical activity for 30 minutes or more, as able and the need to limit carbohydrate intake to 30 to 60 grams per meal to help with blood sugar control.   The need to take medication as prescribed, test blood sugar as directed, and to call between visits if there is a concern that blood sugar is uncontrolled is also discussed.   Leslie Lyons is reminded of the importance of daily foot exam, annual eye examination, and good blood sugar, blood pressure and cholesterol control.     Latest Ref Rng & Units 02/07/2023    8:02 AM 12/21/2022    4:22 PM 10/08/2022   10:01 AM 04/22/2022   10:44 AM 04/22/2022   10:39 AM  Diabetic Labs  HbA1c 4.0 - 5.6 %  7.3    12.4   Chol 100 - 199 mg/dL 161    096    HDL >04 mg/dL 68    55    Calc LDL 0 - 99 mg/dL 540    72    Triglycerides 0 - 149 mg/dL 981    191    Creatinine 0.57 - 1.00 mg/dL 4.78   2.95  6.21        03/15/2023    4:06 PM 03/15/2023    3:29 PM 02/04/2023    3:40 PM 12/21/2022    4:11 PM 12/21/2022    4:05 PM 10/08/2022   10:39 AM 10/08/2022   10:00 AM  BP/Weight  Systolic BP 134 150 153 140 153 133 158  Diastolic BP 84 83 92 82 77 72 89  Wt. (Lbs)   251.06    239.86  BMI   44.47 kg/m2    42.49 kg/m2      08/05/2020    9:20 AM 08/28/2019    8:40 AM  Foot/eye exam completion dates  Foot Form Completion Done Done

## 2023-03-21 NOTE — Assessment & Plan Note (Signed)
Sleep hygiene reviewed and written information offered also. Prescription sent for  medication needed. Trazodone started, has had success on this in the past

## 2023-03-21 NOTE — Assessment & Plan Note (Signed)
Controlled, no change in medication DASH diet and commitment to daily physical activity for a minimum of 30 minutes discussed and encouraged, as a part of hypertension management. The importance of attaining a healthy weight is also discussed.     03/15/2023    4:06 PM 03/15/2023    3:29 PM 02/04/2023    3:40 PM 12/21/2022    4:11 PM 12/21/2022    4:05 PM 10/08/2022   10:39 AM 10/08/2022   10:00 AM  BP/Weight  Systolic BP 134 150 153 140 153 133 158  Diastolic BP 84 83 92 82 77 72 89  Wt. (Lbs)   251.06    239.86  BMI   44.47 kg/m2    42.49 kg/m2

## 2023-03-21 NOTE — Progress Notes (Signed)
Leslie Lyons     MRN: [id number]      DOB: 05/29/1961   HPI Leslie Lyons is here for follow up and re-evaluation of chronic medical conditions, medication management and review of any available recent lab and radiology data.  Preventive health is updated, specifically  Cancer screening and Immunization.   Questions or concerns regarding consultations or procedures which the PT has had in the interim are  addressed.Doing well with blood sugar control through Endo The PT denies c/o increased blood suagr on amlodipine and has stopped it. C/o poor sleep and requests med she has had in the past be resumed ROS Denies recent fever or chills. Denies sinus pressure, nasal congestion, ear pain or sore throat. Denies chest congestion, productive cough or wheezing. Denies chest pains, palpitations and leg swelling Denies abdominal pain, nausea, vomiting,diarrhea or constipation.   Denies dysuria, frequency, hesitancy or incontinence. Denies joint pain, swelling and limitation in mobility. Denies headaches, seizures, numbness, or tingling.  Denies skin break down or rash.   PE  BP 134/84   Pulse 88   Resp 16   SpO2 92%   Patient alert and oriented and in no cardiopulmonary distress.  HEENT: No facial asymmetry, EOMI,     Neck supple .  Chest: Clear to auscultation bilaterally.  CVS: S1, S2 no murmurs, no S3.Regular rate.  ABD: Soft non tender.   Ext: No edema  MS: Adequate ROM spine, shoulders, hips and knees.  Skin: Intact, no ulcerations or rash noted.  Psych: Good eye contact, normal affect. Memory intact not anxious or depressed appearing.  CNS: CN 2-12 intact, power,  normal throughout.no focal deficits noted.   Assessment & Plan  Essential hypertension, benign Controlled, no change in medication DASH diet and commitment to daily physical activity for a minimum of 30 minutes discussed and encouraged, as a part of hypertension management. The importance of  attaining a healthy weight is also discussed.     03/15/2023    4:06 PM 03/15/2023    3:29 PM 02/04/2023    3:40 PM 12/21/2022    4:11 PM 12/21/2022    4:05 PM 10/08/2022   10:39 AM 10/08/2022   10:00 AM  BP/Weight  Systolic BP 134 150 153 140 153 133 158  Diastolic BP 84 83 92 82 77 72 89  Wt. (Lbs)   251.06    239.86  BMI   44.47 kg/m2    42.49 kg/m2       Type 2 diabetes mellitus with hyperglycemia (HCC) Improving based  on data presented, managed by Endo Leslie Lyons is reminded of the importance of commitment to daily physical activity for 30 minutes or more, as able and the need to limit carbohydrate intake to 30 to 60 grams per meal to help with blood sugar control.   The need to take medication as prescribed, test blood sugar as directed, and to call between visits if there is a concern that blood sugar is uncontrolled is also discussed.   Leslie Lyons is reminded of the importance of daily foot exam, annual eye examination, and good blood sugar, blood pressure and cholesterol control.     Latest Ref Rng & Units 02/07/2023    8:02 AM 12/21/2022    4:22 PM 10/08/2022   10:01 AM 04/22/2022   10:44 AM 04/22/2022   10:39 AM  Diabetic Labs  HbA1c 4.0 - 5.6 %  7.3    12.4   Chol 100 - 199 mg/dL 409  150    HDL >39 mg/dL 68    55    Calc LDL 0 - 99 mg/dL 161    72    Triglycerides 0 - 149 mg/dL 096    045    Creatinine 0.57 - 1.00 mg/dL 4.09   8.11  9.14        03/15/2023    4:06 PM 03/15/2023    3:29 PM 02/04/2023    3:40 PM 12/21/2022    4:11 PM 12/21/2022    4:05 PM 10/08/2022   10:39 AM 10/08/2022   10:00 AM  BP/Weight  Systolic BP 134 150 153 140 153 133 158  Diastolic BP 84 83 92 82 77 72 89  Wt. (Lbs)   251.06    239.86  BMI   44.47 kg/m2    42.49 kg/m2      08/05/2020    9:20 AM 08/28/2019    8:40 AM  Foot/eye exam completion dates  Foot Form Completion Done Done        Morbid obesity (HCC)  Patient re-educated about  the importance of commitment to a   minimum of 150 minutes of exercise per week as able.  The importance of healthy food choices with portion control discussed, as well as eating regularly and within a 12 hour window most days. The need to choose "clean , green" food 50 to 75% of the time is discussed, as well as to make water the primary drink and set a goal of 64 ounces water daily.       02/04/2023    3:40 PM 12/21/2022    4:05 PM 10/08/2022   10:00 AM  Weight /BMI  Weight 251 lb 1 oz  239 lb 13.8 oz  Height  (1.6 m)  (1.6 m)  (1.6 m)  BMI 44.47 kg/m2 42.49 kg/m2 42.49 kg/m2      Hyperlipidemia LDL goal <100 Hyperlipidemia:Low fat diet discussed and encouraged.   Lipid Panel  Lab Results  Component Value Date   CHOL 262 (H) 02/07/2023   HDL 68 02/07/2023   LDLCALC 148 (H) 02/07/2023   TRIG 257 (H) 02/07/2023   CHOLHDL 3.9 02/07/2023     Uncontrolled Updated lab needed at/ before next visit.   Insomnia Sleep hygiene reviewed and written information offered also. Prescription sent for  medication needed. Trazodone started, has had success on this in the past

## 2023-04-01 ENCOUNTER — Ambulatory Visit: Payer: Medicare Other | Attending: Internal Medicine | Admitting: Internal Medicine

## 2023-04-01 ENCOUNTER — Encounter: Payer: Self-pay | Admitting: Internal Medicine

## 2023-04-01 ENCOUNTER — Telehealth: Payer: Self-pay | Admitting: Internal Medicine

## 2023-04-01 VITALS — BP 142/90 | HR 98 | Ht 63.0 in | Wt 254.0 lb

## 2023-04-01 DIAGNOSIS — E7800 Pure hypercholesterolemia, unspecified: Secondary | ICD-10-CM

## 2023-04-01 DIAGNOSIS — I10 Essential (primary) hypertension: Secondary | ICD-10-CM

## 2023-04-01 DIAGNOSIS — E785 Hyperlipidemia, unspecified: Secondary | ICD-10-CM | POA: Diagnosis not present

## 2023-04-01 MED ORDER — ROSUVASTATIN CALCIUM 40 MG PO TABS: 40.0000 mg | ORAL_TABLET | Freq: Every day | ORAL | 3 refills | Status: DC

## 2023-04-01 MED ORDER — ICOSAPENT ETHYL 1 G PO CAPS: 2.0000 g | ORAL_CAPSULE | Freq: Two times a day (BID) | ORAL | 5 refills | Status: DC

## 2023-04-01 MED ORDER — BEMPEDOIC ACID-EZETIMIBE 180-10 MG PO TABS: 1.0000 | ORAL_TABLET | Freq: Every day | ORAL | 3 refills | Status: DC

## 2023-04-01 NOTE — Patient Instructions (Addendum)
Medication Instructions:  Your physician has recommended you make the following change in your medication:   -Stop Fenofibrate -Stop Crestor -Stop Zetia -Start Vacepa 1g tablets- take two tablets twice daily -Start Bempedoic Acis/Zetia 180-10 mg tablets once daily   *If you need a refill on your cardiac medications before your next appointment, please call your pharmacy*   Lab Work: Lipid Panel- 3 months If you have labs (blood work) drawn today and your tests are completely normal, you will receive your results only by: MyChart Message (if you have MyChart) OR A paper copy in the mail If you have any lab test that is abnormal or we need to change your treatment, we will call you to review the results.   Testing/Procedures: None   Follow-Up: At [facility], you and your health needs are our priority.  As part of our continuing mission to provide you with exceptional heart care, we have created designated Provider Care Teams.  These Care Teams include your primary Cardiologist (physician) and Advanced Practice Providers (APPs -  Physician Assistants and Nurse Practitioners) who all work together to provide you with the care you need, when you need it.  We recommend signing up for the patient portal called "MyChart".  Sign up information is provided on this After Visit Summary.  MyChart is used to connect with patients for Virtual Visits (Telemedicine).  Patients are able to view lab/test results, encounter notes, upcoming appointments, etc.  Non-urgent messages can be sent to your provider as well.   To learn more about what you can do with MyChart, go to [email or web address].    Your next appointment:   6 months  Provider:   Luane School, MD  Other Instructions

## 2023-04-01 NOTE — Telephone Encounter (Signed)
Pt c/o medication issue:  1. Name of Medication:   Bempedoic Acid-Ezetimibe 180-10 MG TABS    2. How are you currently taking this medication (dosage and times per day)?   3. Are you having a reaction (difficulty breathing--STAT)?   4. What is your medication issue? Pt states insurance will not cover this medication and she is unable to afford out of pocket cost. Please advise what pt should do.

## 2023-04-01 NOTE — Telephone Encounter (Signed)
Patient agrees to take Crestor 40 mg daily and will speak with pcp regarding any elevated blood sugars

## 2023-04-01 NOTE — Progress Notes (Signed)
Cardiology Office Note  Date: 04/01/2023   ID: Leslie Lyons, DOB 01-02-1961, MRN [id number]  PCP:  [name], MD  Cardiologist:  Marjo Bicker, MD Electrophysiologist:  None   Reason for Office Visit: HLD evaluation at the request of Dr. Lodema Hong   History of Present Illness: Leslie Lyons is a 62 y.o. female known to have HTN, DM 2, HLD was referred to cardiology clinic for management of HLD.  Patient is currently on rosuvastatin 40 mg nightly and fenofibrate 145 mg once daily.  Zetia was discontinued. Patient is worried about increased blood glucose levels after starting rosuvastatin.  Otherwise denied any angina, DOE, palpitations, leg swelling and syncope.  Denied smoking cigarettes, alcohol use and illicit drug abuse.  Past Medical History:  Diagnosis Date   Abnormal transaminases    Anxiety    Bladder incontinence    Bronchial asthma    with acute exacerbation   Cancer (HCC) 1980   cervical, diag  at age 76   Depression    Diabetes (HCC)    Diabetes mellitus, type II (HCC)    Headache(784.0)    with reduced vision in right eye for one month    Hyperlipidemia    Hypertension    Obesity     Past Surgical History:  Procedure Laterality Date   ABDOMINAL HYSTERECTOMY     COLONOSCOPY WITH ESOPHAGOGASTRODUODENOSCOPY (EGD)  11/2008   Dr. Lovell Sheehan: Conscious sedation.  Hiatal hernia.  Hemorrhoids. PATIENT REPORTS BEING AWAKE DURING PROCEDURE   COLONOSCOPY WITH PROPOFOL N/A 10/08/2022   Procedure: COLONOSCOPY WITH PROPOFOL;  Surgeon: Lanelle Bal, DO;  Location: [facility];  Service: Endoscopy;  Laterality: N/A;  7:30 AM   CRYOTHERAPY     for abnormal pap   HEMORRHOID SURGERY     dr. Elpidio Anis   PARTIAL HYSTERECTOMY     POLYPECTOMY  10/08/2022   Procedure: POLYPECTOMY;  Surgeon: Lanelle Bal, DO;  Location: [facility];  Service: Endoscopy;;    Current Outpatient Medications  Medication Sig Dispense Refill   albuterol  (VENTOLIN HFA) 108 (90 Base) MCG/ACT inhaler INHALE 2 PUFFS BY MOUTH EVERY 6 HOURS AS NEEDED 18 g 0   Bempedoic Acid-Ezetimibe 180-10 MG TABS Take 1 tablet by mouth daily. 30 tablet 3   blood glucose meter kit and supplies Dispense based on patient and insurance preference. Test blood sugar once daily before breakfast. (FOR ICD-10 E10.9, E11.9). 1 each 0   Blood Glucose Monitoring Suppl (ONETOUCH VERIO FLEX SYSTEM) w/Device KIT Use to monitor glucose twice daily 1 kit 0   glipiZIDE (GLUCOTROL XL) 5 MG 24 hr tablet Take 1 tablet (5 mg total) by mouth daily with breakfast. 90 tablet 3   glucose blood (ONETOUCH VERIO) test strip Use as instructed to measure glucose twice daily 100 each 12   icosapent Ethyl (VASCEPA) 1 g capsule Take 2 capsules (2 g total) by mouth 2 (two) times daily. 120 capsule 5   insulin degludec (TRESIBA FLEXTOUCH) 100 UNIT/ML FlexTouch Pen Inject 24 Units into the skin at bedtime. 21 mL 3   Insulin Pen Needle (PEN NEEDLES) 32G X 4 MM MISC Use to inject insulin once daily 100 each 3   Lancets (ONETOUCH ULTRASOFT) lancets Use as instructed to monitor glucose twice daily 100 each 12   losartan (COZAAR) 100 MG tablet Take 1 tablet by mouth once daily 90 tablet 0   mirabegron ER (MYRBETRIQ) 50 MG TB24 tablet Take 1 tablet (50 mg total)  by mouth daily. 30 tablet 5   mometasone-formoterol (DULERA) 200-5 MCG/ACT AERO Inhale 2 puffs into the lungs 2 (two) times daily. 13 g 2   traZODone (DESYREL) 100 MG tablet Take 1 tablet (100 mg total) by mouth at bedtime. 30 tablet 3   No current facility-administered medications for this visit.   Allergies:  Janumet [sitagliptin-metformin hcl] and Robaxin [methocarbamol]   Social History: The patient  reports that she quit smoking about 7 years ago. Her smoking use included cigarettes. She has a 0.50 pack-year smoking history. She has never used smokeless tobacco. She reports that she does not drink alcohol and does not use drugs.   Family  History: The patient's family history includes COPD in her father; Cancer in her maternal aunt, mother, and paternal uncle; Hypertension in her father.   ROS:  Please see the history of present illness. Otherwise, complete review of systems is positive for none.  All other systems are reviewed and negative.   Physical Exam: VS:  BP (!) 142/90   Pulse 98   Ht 5\' 3"  (1.6 m)   Wt 254 lb (115.2 kg)   SpO2 95%   BMI 44.99 kg/m , BMI Body mass index is 44.99 kg/m.  Wt Readings from Last 3 Encounters:  04/01/23 254 lb (115.2 kg)  02/04/23 251 lb 1 oz (113.9 kg)  10/08/22 239 lb 13.8 oz (108.8 kg)    General: Patient appears comfortable at rest. HEENT: Conjunctiva and lids normal, oropharynx clear with moist mucosa. Neck: Supple, no elevated JVP or carotid bruits, no thyromegaly. Lungs: Clear to auscultation, nonlabored breathing at rest. Cardiac: Regular rate and rhythm, no S3 or significant systolic murmur, no pericardial rub. Abdomen: Soft, nontender, no hepatomegaly, bowel sounds present, no guarding or rebound. Extremities: No pitting edema, distal pulses 2+. Skin: Warm and dry. Musculoskeletal: No kyphosis. Neuropsychiatric: Alert and oriented x3, affect grossly appropriate.  Recent Labwork: 02/07/2023: ALT 25; AST 28; BUN 10; Creatinine, Ser 0.86; Hemoglobin 13.9; Platelets 166; Potassium 4.3; Sodium 140; TSH 2.730     Component Value Date/Time   CHOL 262 (H) 02/07/2023 0802   TRIG 257 (H) 02/07/2023 0802   HDL 68 02/07/2023 0802   CHOLHDL 3.9 02/07/2023 0802   CHOLHDL 2.9 08/05/2020 1107   VLDL 25 08/05/2020 1107   LDLCALC 148 (H) 02/07/2023 0802   LDLCALC 169 (H) 07/13/2019 0816    Other Studies Reviewed Today:   Assessment and Plan: Patient is a 62 year old F known to have HTN, HLD was referred to cardiology clinic for management of HLD.  # Hyperlipidemia # Hypertriglyceridemia -Switch rosuvastatin to bempedoic acid and Zetia combination -Switch fenofibrate to  Vascepa 2 g twice daily -Repeat lipid panel in 3 months  # HTN, partially controlled: Continue losartan 100 mg once daily # Morbid obesity: Diet and exercise counseling provided.   I have spent a total of 45 minutes with patient reviewing chart, EKGs, labs and examining patient as well as establishing an assessment and plan that was discussed with the patient.  > 50% of time was spent in direct patient care.    Medication Adjustments/Labs and Tests Ordered: Current medicines are reviewed at length with the patient today.  Concerns regarding medicines are outlined above.   Tests Ordered: Orders Placed This Encounter  Procedures   Lipid panel   EKG 12-Lead    Medication Changes: Meds ordered this encounter  Medications   icosapent Ethyl (VASCEPA) 1 g capsule    Sig: Take 2 capsules (2 g  total) by mouth 2 (two) times daily.    Dispense:  120 capsule    Refill:  5   Bempedoic Acid-Ezetimibe 180-10 MG TABS    Sig: Take 1 tablet by mouth daily.    Dispense:  30 tablet    Refill:  3    Disposition:  Follow up  6 months  Signed, Donta Fuster Verne Spurr, MD, 04/01/2023 1:01 PM    [facility] at [facility] [address], [place], [place] [address]

## 2023-04-04 ENCOUNTER — Telehealth: Payer: Self-pay | Admitting: Family Medicine

## 2023-04-04 NOTE — Telephone Encounter (Signed)
Prescription Request  04/04/2023  LOV: 03/15/2023  What is the name of the medication or equipment? traZODone (DESYREL) 100 MG tablet   Have you contacted your pharmacy to request a refill? Yes   Which pharmacy would you like this sent to?  [facility] [address], [place] - [address] [place] [address] Phone: [phone] Fax: [phone]    Patient notified that their request is being sent to the clinical staff for review and that they should receive a response within 2 business days.   Please advise at Mobile [phone] (mobile)

## 2023-04-05 ENCOUNTER — Other Ambulatory Visit: Payer: Self-pay | Admitting: Nurse Practitioner

## 2023-04-05 MED ORDER — OZEMPIC (0.25 OR 0.5 MG/DOSE) 2 MG/3ML ~~LOC~~ SOPN: PEN_INJECTOR | SUBCUTANEOUS | 0 refills | Status: DC

## 2023-04-05 NOTE — Progress Notes (Signed)
Patient came by stating insurance will now cover Ozempic (which we have been holding off on using due to cost).  I sent in prescription today to [facility].  I did give her patient assistance paperwork for [employer] which will help cover cost for [place] and Ozempic in the future if she is approved.

## 2023-04-08 ENCOUNTER — Other Ambulatory Visit: Payer: Self-pay

## 2023-04-08 DIAGNOSIS — F5104 Psychophysiologic insomnia: Secondary | ICD-10-CM

## 2023-04-08 MED ORDER — TRAZODONE HCL 100 MG PO TABS
100.0000 mg | ORAL_TABLET | Freq: Every day | ORAL | 3 refills | Status: DC
Start: 2023-04-08 — End: 2023-07-11

## 2023-04-08 NOTE — Telephone Encounter (Signed)
Refills sent

## 2023-04-14 ENCOUNTER — Other Ambulatory Visit: Payer: Self-pay | Admitting: Family Medicine

## 2023-04-14 LAB — HM DIABETES EYE EXAM

## 2023-04-19 ENCOUNTER — Other Ambulatory Visit (HOSPITAL_COMMUNITY): Payer: Self-pay | Admitting: Family Medicine

## 2023-04-19 DIAGNOSIS — Z1231 Encounter for screening mammogram for malignant neoplasm of breast: Secondary | ICD-10-CM

## 2023-04-26 ENCOUNTER — Other Ambulatory Visit: Payer: Self-pay | Admitting: Nurse Practitioner

## 2023-04-26 ENCOUNTER — Ambulatory Visit: Payer: Medicare Other | Admitting: Nurse Practitioner

## 2023-04-27 ENCOUNTER — Ambulatory Visit: Payer: Medicare Other

## 2023-04-28 ENCOUNTER — Ambulatory Visit (INDEPENDENT_AMBULATORY_CARE_PROVIDER_SITE_OTHER): Payer: Medicare Other | Admitting: Pulmonary Disease

## 2023-04-28 ENCOUNTER — Encounter: Payer: Self-pay | Admitting: Pulmonary Disease

## 2023-04-28 VITALS — Ht 63.0 in | Wt 253.2 lb

## 2023-04-28 DIAGNOSIS — R0683 Snoring: Secondary | ICD-10-CM

## 2023-04-28 NOTE — Progress Notes (Signed)
[facility]  Chief Complaint  Patient presents with   Consult    Pt consult reports that she snores, possible witnessed apnea, constant headaches, and sleep difficulty in general. Never had a sleep study    Past Surgical History:  She  has a past surgical history that includes Partial hysterectomy; Cryotherapy; Abdominal hysterectomy; Hemorrhoid surgery; Colonoscopy with esophagogastroduodenoscopy (egd) (11/2008); Colonoscopy with propofol (N/A, 10/08/2022); and polypectomy (10/08/2022).  Past Medical History:  Anxiety, Cervical cancer, Depression, DM type 2, Headaches, HLD, HTN, Asthma  Constitutional:  Ht 5\' 3"  (1.6 m)   Wt 253 lb 3.2 oz (114.9 kg)   BMI 44.85 kg/m   Brief Summary:  Leslie Lyons is a 62 y.o. female with snoring.      Subjective:   She is here with her significant other.  She started having trouble with her sleep years ago after her son went missing.  She had trouble falling asleep and staying asleep.  She was using xanax to help with anxiety and this helped her sleep.  She has been off xanax for the past 4 years.  She was prescribed trazodone but this hasn't helped.  OTC sleep aides don't help either.  She feels tired during the day and falls asleep when sitting quiet.  She goes to bed at different times depending on when she is sleepy.  She falls asleep quickly, but wakes up after a couple of hours.  She uses the bathroom several times per night.  Gets up at different times during the day.  She snores and stops breathing while asleep.  She drinks tea during the day.  She will talk in her sleep sometimes.  She denies sleep walking, bruxism, or nightmares.  There is no history of restless legs.  She denies sleep hallucinations, sleep paralysis, or cataplexy.  The Epworth score is 10 out of 24.   Physical Exam:   Appearance - well kempt   ENMT - no sinus tenderness, no oral exudate, no LAN, Mallampati 4  airway, no stridor  Respiratory - equal breath sounds bilaterally, no wheezing or rales  CV - s1s2 regular rate and rhythm, no murmurs  Ext - no clubbing, no edema  Skin - no rashes  Psych - normal mood and affect   Pulmonary testing:  Spirometry 07/09/14 >> FEV1 1.81 (83%), FEV1% 82.7  Sleep Tests:    Social History:  She  reports that she quit smoking about 7 years ago. Her smoking use included cigarettes. She has a 0.50 pack-year smoking history. She has never used smokeless tobacco. She reports that she does not drink alcohol and does not use drugs.  Family History:  Her family history includes COPD in her father; Cancer in her maternal aunt, mother, and paternal uncle; Hypertension in her father.    Discussion:  She has trouble falling asleep and staying asleep.  She also has snoring, sleep disruption, apnea, and daytime sleepiness.  She used to work third shift.  She has history of hypertension, anxiety and depression.  Her BMI is > 35.  I am concerned could have obstructive sleep apnea.  She likely also has insomnia related to her mood disorders.  Assessment/Plan:   Snoring with excessive daytime sleepiness. - will need to arrange for a home sleep study  Insomnia with anxiety and depression. - discussed proper sleep hygiene - reviewed stimulus control, sleep restriction, and relaxation techniques - defer further intervention until after her sleep study is reviewed  Asthma. - managed by her PCP  Obesity. - discussed how weight can impact sleep and risk for sleep disordered breathing - discussed options to assist with weight loss: combination of diet modification, cardiovascular and strength training exercises  Cardiovascular risk. - had an extensive discussion regarding the adverse health consequences related to untreated sleep disordered breathing - specifically discussed the risks for hypertension, coronary artery disease, cardiac dysrhythmias, cerebrovascular  disease, and diabetes - lifestyle modification discussed  Safe driving practices. - discussed how sleep disruption can increase risk of accidents, particularly when driving - safe driving practices were discussed  Therapies for obstructive sleep apnea. - if the sleep study shows significant sleep apnea, then various therapies for treatment were reviewed: CPAP, oral appliance, and surgical interventions  Time Spent Involved in Patient Care on Day of Examination:  50 minutes  Follow up:   Patient Instructions  Will arrange for a home sleep study Will call to arrange for follow up after sleep study reviewed   Medication List:   Allergies as of 04/28/2023       Reactions   Janumet [sitagliptin-metformin Hcl]    States she vomits up the whole pill after taking this   Robaxin [methocarbamol] Swelling        Medication List        Accurate as of Apr 28, 2023 11:04 AM. If you have any questions, ask your nurse or doctor.          albuterol 108 (90 Base) MCG/ACT inhaler Commonly known as: VENTOLIN HFA INHALE 2 PUFFS BY MOUTH EVERY 6 HOURS AS NEEDED   blood glucose meter kit and supplies Dispense based on patient and insurance preference. Test blood sugar once daily before breakfast. (FOR ICD-10 E10.9, E11.9).   Dulera 200-5 MCG/ACT Aero Generic drug: mometasone-formoterol Inhale 2 puffs into the lungs 2 (two) times daily.   glipiZIDE 5 MG 24 hr tablet Commonly known as: GLUCOTROL XL Take 1 tablet (5 mg total) by mouth daily with breakfast.   icosapent Ethyl 1 g capsule Commonly known as: Vascepa Take 2 capsules (2 g total) by mouth 2 (two) times daily.   losartan 100 MG tablet Commonly known as: COZAAR Take 1 tablet by mouth once daily   mirabegron ER 50 MG Tb24 tablet Commonly known as: MYRBETRIQ Take 1 tablet (50 mg total) by mouth daily.   onetouch ultrasoft lancets Use as instructed to monitor glucose twice daily   OneTouch Verio Flex System w/Device  Kit Use to monitor glucose twice daily   OneTouch Verio test strip Generic drug: glucose blood Use as instructed to measure glucose twice daily   Ozempic (0.25 or 0.5 MG/DOSE) 2 MG/3ML Sopn Generic drug: Semaglutide(0.25 or 0.5MG /DOS) INJECT 0.25 MG SUBCUTANEOUSLY ONCE A WEEK FOR 2 WEEKS, THEN INCREASE TO 0.5 MG WEEKLY THEREAFTER   Pen Needles 32G X 4 MM Misc Use to inject insulin once daily   rosuvastatin 40 MG tablet Commonly known as: CRESTOR Take 1 tablet (40 mg total) by mouth at bedtime.   traZODone 100 MG tablet Commonly known as: DESYREL Take 1 tablet (100 mg total) by mouth at bedtime.   Evaristo Bury FlexTouch 100 UNIT/ML FlexTouch Pen Generic drug: insulin degludec Inject 24 Units into the skin at bedtime.        Signature:  Coralyn Helling, MD [facility] Pager - [phone] 04/28/2023, 11:04 AM

## 2023-04-28 NOTE — Patient Instructions (Signed)
Will arrange for a home sleep study Will call to arrange for follow up after sleep study reviewed  

## 2023-05-04 ENCOUNTER — Ambulatory Visit (HOSPITAL_COMMUNITY)
Admission: RE | Admit: 2023-05-04 | Discharge: 2023-05-04 | Disposition: A | Discharge status: Home / Self Care | Payer: Medicare Other | Source: Ambulatory Visit | Attending: Family Medicine | Admitting: Family Medicine

## 2023-05-04 ENCOUNTER — Encounter: Payer: Self-pay | Admitting: Nurse Practitioner

## 2023-05-04 ENCOUNTER — Ambulatory Visit: Payer: Medicare Other | Admitting: Nurse Practitioner

## 2023-05-04 ENCOUNTER — Encounter (HOSPITAL_COMMUNITY): Payer: Self-pay

## 2023-05-04 ENCOUNTER — Telehealth: Payer: Self-pay | Admitting: Nurse Practitioner

## 2023-05-04 VITALS — BP 130/86 | HR 80 | Ht 63.0 in | Wt 249.0 lb

## 2023-05-04 DIAGNOSIS — Z7984 Long term (current) use of oral hypoglycemic drugs: Secondary | ICD-10-CM | POA: Diagnosis not present

## 2023-05-04 DIAGNOSIS — E782 Mixed hyperlipidemia: Secondary | ICD-10-CM | POA: Diagnosis not present

## 2023-05-04 DIAGNOSIS — Z1231 Encounter for screening mammogram for malignant neoplasm of breast: Secondary | ICD-10-CM

## 2023-05-04 DIAGNOSIS — I10 Essential (primary) hypertension: Secondary | ICD-10-CM

## 2023-05-04 DIAGNOSIS — Z7985 Long-term (current) use of injectable non-insulin antidiabetic drugs: Secondary | ICD-10-CM | POA: Diagnosis not present

## 2023-05-04 DIAGNOSIS — Z794 Long term (current) use of insulin: Secondary | ICD-10-CM

## 2023-05-04 DIAGNOSIS — E1165 Type 2 diabetes mellitus with hyperglycemia: Secondary | ICD-10-CM

## 2023-05-04 LAB — POCT GLYCOSYLATED HEMOGLOBIN (HGB A1C): Hemoglobin A1C: 7.9 % — AB (ref 4.0–5.6)

## 2023-05-04 MED ORDER — TRESIBA FLEXTOUCH 100 UNIT/ML ~~LOC~~ SOPN: 30.0000 [IU] | PEN_INJECTOR | Freq: Every day | SUBCUTANEOUS | 3 refills | Status: AC

## 2023-05-04 NOTE — Telephone Encounter (Signed)
noted 

## 2023-05-04 NOTE — Telephone Encounter (Signed)
Novo nordisk refill form sent with changes

## 2023-05-04 NOTE — Patient Instructions (Signed)

## 2023-05-04 NOTE — Progress Notes (Signed)
Endocrinology Follow Up Note       05/04/2023, 4:01 PM   Subjective:    Patient ID: Leslie Lyons, female    DOB: 07-17-1961.  Leslie Lyons is being seen in follow up after being seen in consultation for management of currently uncontrolled symptomatic diabetes requested by  [name], MD.   Past Medical History:  Diagnosis Date   Abnormal transaminases    Anxiety    Bladder incontinence    Bronchial asthma    with acute exacerbation   Cancer (HCC) 1980   cervical, diag  at age 86   Depression    Diabetes (HCC)    Diabetes mellitus, type II (HCC)    Headache(784.0)    with reduced vision in right eye for one month    Hyperlipidemia    Hypertension    Obesity     Past Surgical History:  Procedure Laterality Date   ABDOMINAL HYSTERECTOMY     COLONOSCOPY WITH ESOPHAGOGASTRODUODENOSCOPY (EGD)  11/2008   Dr. Lovell Sheehan: Conscious sedation.  Hiatal hernia.  Hemorrhoids. PATIENT REPORTS BEING AWAKE DURING PROCEDURE   COLONOSCOPY WITH PROPOFOL N/A 10/08/2022   Procedure: COLONOSCOPY WITH PROPOFOL;  Surgeon: Lanelle Bal, DO;  Location: [facility];  Service: Endoscopy;  Laterality: N/A;  7:30 AM   CRYOTHERAPY     for abnormal pap   HEMORRHOID SURGERY     dr. Elpidio Anis   PARTIAL HYSTERECTOMY     POLYPECTOMY  10/08/2022   Procedure: POLYPECTOMY;  Surgeon: Lanelle Bal, DO;  Location: [facility];  Service: Endoscopy;;    Social History   Socioeconomic History   Marital status: Married    Spouse name: Not on file   Number of children: 2   Years of education: Not on file   Highest education level: Not on file  Occupational History   Occupation: homemaker   Tobacco Use   Smoking status: Former    Packs/day: 0.25    Years: 2.00    Additional pack years: 0.00    Total pack years: 0.50    Types: Cigarettes    Quit date: 12/04/2015    Years since quitting: 7.4    Smokeless tobacco: Never  Vaping Use   Vaping Use: Never used  Substance and Sexual Activity   Alcohol use: No   Drug use: No   Sexual activity: Yes  Other Topics Concern   Not on file  Social History Narrative   Lives at home with husband    Social Determinants of Health   Financial Resource Strain: Low Risk  (11/15/2022)   Overall Financial Resource Strain (CARDIA)    Difficulty of Paying Living Expenses: Not hard at all  Food Insecurity: No Food Insecurity (11/15/2022)   Hunger Vital Sign    Worried About Running Out of Food in the Last Year: Never true    Ran Out of Food in the Last Year: Never true  Transportation Needs: No Transportation Needs (11/15/2022)   PRAPARE - Administrator, Civil Service (Medical): No    Lack of Transportation (Non-Medical): No  Physical Activity: Insufficiently Active (11/15/2022)   Exercise Vital Sign  Days of Exercise per Week: 4 days    Minutes of Exercise per Session: 30 min  Stress: No Stress Concern Present (11/15/2022)   [employer] of Occupational Health - Occupational Stress Questionnaire    Feeling of Stress : Not at all  Social Connections: Socially Isolated (11/15/2022)   Social Connection and Isolation Panel [NHANES]    Frequency of Communication with Friends and Family: More than three times a week    Frequency of Social Gatherings with Friends and Family: More than three times a week    Attends Religious Services: Never    Database administrator or Organizations: No    Attends Banker Meetings: Never    Marital Status: Separated    Family History  Problem Relation Age of Onset   Cancer Mother        lung    COPD Father        emphysema   Hypertension Father    Cancer Maternal Aunt        breast   Cancer Paternal Uncle    Colon cancer Neg Hx     Outpatient Encounter Medications as of 05/04/2023  Medication Sig   albuterol (VENTOLIN HFA) 108 (90 Base) MCG/ACT inhaler INHALE 2 PUFFS BY  MOUTH EVERY 6 HOURS AS NEEDED   blood glucose meter kit and supplies Dispense based on patient and insurance preference. Test blood sugar once daily before breakfast. (FOR ICD-10 E10.9, E11.9).   Blood Glucose Monitoring Suppl (ONETOUCH VERIO FLEX SYSTEM) w/Device KIT Use to monitor glucose twice daily   glucose blood (ONETOUCH VERIO) test strip Use as instructed to measure glucose twice daily   icosapent Ethyl (VASCEPA) 1 g capsule Take 2 capsules (2 g total) by mouth 2 (two) times daily.   Insulin Pen Needle (PEN NEEDLES) 32G X 4 MM MISC Use to inject insulin once daily   Lancets (ONETOUCH ULTRASOFT) lancets Use as instructed to monitor glucose twice daily   losartan (COZAAR) 100 MG tablet Take 1 tablet by mouth once daily   mirabegron ER (MYRBETRIQ) 50 MG TB24 tablet Take 1 tablet (50 mg total) by mouth daily.   mometasone-formoterol (DULERA) 200-5 MCG/ACT AERO Inhale 2 puffs into the lungs 2 (two) times daily.   rosuvastatin (CRESTOR) 40 MG tablet Take 1 tablet (40 mg total) by mouth at bedtime.   Semaglutide,0.25 or 0.5MG /DOS, (OZEMPIC, 0.25 OR 0.5 MG/DOSE,) 2 MG/3ML SOPN INJECT 0.25 MG SUBCUTANEOUSLY ONCE A WEEK FOR 2 WEEKS, THEN INCREASE TO 0.5 MG WEEKLY THEREAFTER   traZODone (DESYREL) 100 MG tablet Take 1 tablet (100 mg total) by mouth at bedtime.   [DISCONTINUED] glipiZIDE (GLUCOTROL XL) 5 MG 24 hr tablet Take 1 tablet (5 mg total) by mouth daily with breakfast.   [DISCONTINUED] insulin degludec (TRESIBA FLEXTOUCH) 100 UNIT/ML FlexTouch Pen Inject 24 Units into the skin at bedtime.   insulin degludec (TRESIBA FLEXTOUCH) 100 UNIT/ML FlexTouch Pen Inject 30 Units into the skin at bedtime.   No facility-administered encounter medications on file as of 05/04/2023.    ALLERGIES: Allergies  Allergen Reactions   Janumet [Sitagliptin-Metformin Hcl]     States she vomits up the whole pill after taking this   Robaxin [Methocarbamol] Swelling    VACCINATION STATUS: Immunization History   Administered Date(s) Administered   Influenza Whole 09/25/2007, 11/04/2009, 09/22/2010   Influenza,inj,Quad PF,6+ Mos 08/09/2013, 07/22/2014, 11/17/2015, 01/04/2018, 10/03/2018, 08/28/2019, 08/05/2020, 09/15/2022   Janssen (J&J) SARS-COV-2 Vaccination 05/20/2020   Pneumococcal Conjugate-13 04/25/2018   Pneumococcal Polysaccharide-23 07/31/2009,  07/05/2016   Td 03/26/2009    Diabetes She presents for her follow-up diabetic visit. She has type 2 diabetes mellitus. Onset time: Diagnosed at approx age of 74. Her disease course has been improving. There are no hypoglycemic associated symptoms. There are no diabetic associated symptoms. There are no hypoglycemic complications. Diabetic complications include peripheral neuropathy. Risk factors for coronary artery disease include diabetes mellitus, dyslipidemia, family history, obesity, hypertension, sedentary lifestyle and post-menopausal. Current diabetic treatment includes oral agent (monotherapy) and insulin injections. She is compliant with treatment most of the time. Her weight is decreasing steadily. She is following a generally healthy diet. When asked about meal planning, she reported none. She has had a previous visit with a dietitian (went to DM class previously). Her home blood glucose trend is decreasing steadily. Her breakfast blood glucose range is generally 130-140 mg/dl. Her bedtime blood glucose range is generally 90-110 mg/dl. (She presents today with her meter and logs showing improving glycemic profile.  Her POCT A1c today is 7.9%, increasing slightly from last visit of 7.3%.  She notes she has tolerated the Ozempic well (was recently approved for PAP for Novo to help cover costs).  Analysis of her meter shows 7-day average of 127, 14-day average of 127, 30-day average of 146.  She denies any significant hypoglycemia.) An ACE inhibitor/angiotensin II receptor blocker is being taken. She sees a podiatrist.Eye exam is not current.      Review of systems  Constitutional: + steadily decreasing body weight, current Body mass index is 44.11 kg/m., + fatigue-improving, no subjective hyperthermia, no subjective hypothermia Eyes: + blurry vision, no xerophthalmia ENT: no sore throat, no nodules palpated in throat, no dysphagia/odynophagia, no hoarseness Cardiovascular: no chest pain, no shortness of breath, no palpitations, no leg swelling Respiratory: no cough, no shortness of breath Gastrointestinal: no nausea/vomiting/diarrhea Musculoskeletal: no muscle/joint aches Skin: no rashes, no hyperemia Neurological: no tremors, no numbness, no tingling, no dizziness Psychiatric: no depression, no anxiety  Objective:     BP 130/86 (BP Location: Right Arm, Patient Position: Sitting, Cuff Size: Large) Comment: Retake Manuel Cuff  Pulse 80   Ht 5\' 3"  (1.6 m)   Wt 249 lb (112.9 kg)   BMI 44.11 kg/m   Wt Readings from Last 3 Encounters:  05/04/23 249 lb (112.9 kg)  04/28/23 253 lb 3.2 oz (114.9 kg)  04/01/23 254 lb (115.2 kg)     BP Readings from Last 3 Encounters:  05/04/23 130/86  04/01/23 (!) 142/90  03/15/23 134/84      Physical Exam- Limited  Constitutional:  Body mass index is 44.11 kg/m. , not in acute distress, normal state of mind Eyes:  EOMI, no exophthalmos Neck: Supple Musculoskeletal: no gross deformities, strength intact in all four extremities, no gross restriction of joint movements Skin:  no rashes, no hyperemia Neurological: no tremor with outstretched hands  Diabetic Foot Exam - Simple   No data filed     CMP ( most recent) CMP     Component Value Date/Time   NA 140 02/07/2023 0802   K 4.3 02/07/2023 0802   CL 93 (L) 02/07/2023 0802   CO2 26 02/07/2023 0802   GLUCOSE 134 (H) 02/07/2023 0802   GLUCOSE 128 (H) 10/08/2022 1001   BUN 10 02/07/2023 0802   CREATININE 0.86 02/07/2023 0802   CREATININE 1.56 (H) 01/07/2020 0913   CALCIUM 10.2 02/07/2023 0802   PROT 8.3 02/07/2023  0802   ALBUMIN 4.9 02/07/2023 0802   AST 28 02/07/2023 0802  ALT 25 02/07/2023 0802   ALKPHOS 108 02/07/2023 0802   BILITOT 0.4 02/07/2023 0802   GFRNONAA >60 03/09/2021 0944   GFRNONAA 69 07/13/2019 0816   GFRAA >60 08/05/2020 1107   GFRAA 80 07/13/2019 0816     Diabetic Labs (most recent): Lab Results  Component Value Date   HGBA1C 7.9 (A) 05/04/2023   HGBA1C 7.3 (A) 12/21/2022   HGBA1C 12.4 (A) 04/22/2022   MICROALBUR 22.2 (H) 08/05/2020   MICROALBUR 8.6 03/27/2019   MICROALBUR 1.9 01/06/2018     Lipid Panel ( most recent) Lipid Panel     Component Value Date/Time   CHOL 262 (H) 02/07/2023 0802   TRIG 257 (H) 02/07/2023 0802   HDL 68 02/07/2023 0802   CHOLHDL 3.9 02/07/2023 0802   CHOLHDL 2.9 08/05/2020 1107   VLDL 25 08/05/2020 1107   LDLCALC 148 (H) 02/07/2023 0802   LDLCALC 169 (H) 07/13/2019 0816   LABVLDL 46 (H) 02/07/2023 0802      Lab Results  Component Value Date   TSH 2.730 02/07/2023   TSH 1.780 04/22/2022   TSH 2.630 06/30/2021   TSH 1.426 08/05/2020   TSH 0.869 04/22/2020   TSH 3.56 03/27/2019   TSH 1.80 01/04/2018   TSH 2.75 05/12/2016   TSH 1.915 07/05/2013   TSH 0.829 06/25/2011   FREET4 1.22 02/07/2023        Assessment & Plan:   1) Type 2 diabetes mellitus with hyperglycemia, without long-term current use of insulin (HCC)  She presents today with her meter and logs showing improving glycemic profile.  Her POCT A1c today is 7.9%, increasing slightly from last visit of 7.3%.  She notes she has tolerated the Ozempic well (was recently approved for PAP for Novo to help cover costs).  Analysis of her meter shows 7-day average of 127, 14-day average of 127, 30-day average of 146.  She denies any significant hypoglycemia.  - RAYZA CRANNELL has currently uncontrolled symptomatic type 2 DM since 62 years of age.   -Recent labs reviewed.  - I had a long discussion with her about the progressive nature of diabetes and the pathology behind  its complications. -her diabetes is complicated by neuropathy and she remains at a high risk for more acute and chronic complications which include CAD, CVA, CKD, retinopathy, and neuropathy. These are all discussed in detail with her.  The following Lifestyle Medicine recommendations according to American College of Lifestyle Medicine [facility]) were discussed and offered to patient and she agrees to start the journey:  A. Whole Foods, Plant-based plate comprising of fruits and vegetables, plant-based proteins, whole-grain carbohydrates was discussed in detail with the patient.   A list for source of those nutrients were also provided to the patient.  Patient will use only water or unsweetened tea for hydration. B.  The need to stay away from risky substances including alcohol, smoking; obtaining 7 to 9 hours of restorative sleep, at least 150 minutes of moderate intensity exercise weekly, the importance of healthy social connections,  and stress reduction techniques were discussed. C.  A full color page of  Calorie density of various food groups per pound showing examples of each food groups was provided to the patient.  - Nutritional counseling repeated at each appointment due to patients tendency to fall back in to old habits.  - The patient admits there is a room for improvement in their diet and drink choices. -  Suggestion is made for the patient to avoid simple carbohydrates  from their diet including Cakes, Sweet Desserts / Pastries, Ice Cream, Soda (diet and regular), Sweet Tea, Candies, Chips, Cookies, Sweet Pastries, Store Bought Juices, Alcohol in Excess of 1-2 drinks a day, Artificial Sweeteners, Coffee Creamer, and "Sugar-free" Products. This will help patient to have stable blood glucose profile and potentially avoid unintended weight gain.   - I encouraged the patient to switch to unprocessed or minimally processed complex starch and increased protein intake (animal or plant source), fruits,  and vegetables.   - Patient is advised to stick to a routine mealtimes to eat 3 meals a day and avoid unnecessary snacks (to snack only to correct hypoglycemia).  - I have approached her with the following individualized plan to manage her diabetes and patient agrees:   -She is advised to continue Tresiba 30 units SQ nightlyand Ozempic 0.5 mg SQ weekly.  She is advised to stop her Glipizide today.  -she is encouraged to continue monitoring blood glucose twice daily, before breakfast and before bed, and to call the clinic if he has readings less than 70 or above 300 for 3 tests in a row.   - Adjustment parameters are given to her for hypo and hyperglycemia in writing.  -She did not tolerate Janumet in the past (severe nausea with vomiting).  - Specific targets for  A1c; LDL, HDL, and Triglycerides were discussed with the patient.  2) Blood Pressure /Hypertension:  her blood pressure is controlled to target.   she is advised to continue her current medications including Losartan 50 mg p.o. daily with breakfast.  3) Lipids/Hyperlipidemia:    Review of her recent lipid panel from 02/07/23 showed uncontrolled LDL at 148 and elevated triglycerides of 257.  She is advised to continue Crestor 40 mg po daily and Vascepa 2 g twice daily.  4)  Weight/Diet:  her Body mass index is 44.11 kg/m.  -  clearly complicating her diabetes care.   she is a candidate for weight loss. I discussed with her the fact that loss of 5 - 10% of her  current body weight will have the most impact on her diabetes management.  Exercise, and detailed carbohydrates information provided  -  detailed on discharge instructions.  5) Chronic Care/Health Maintenance: -she is on ACEI/ARB and not on Statin medications and is encouraged to initiate and continue to follow up with Ophthalmology, Dentist, Podiatrist at least yearly or according to recommendations, and advised to stay away from smoking. I have recommended yearly flu  vaccine and pneumonia vaccine at least every 5 years; moderate intensity exercise for up to 150 minutes weekly; and sleep for at least 7 hours a day.  - she is advised to maintain close follow up with [name], MD for primary care needs, as well as her other providers for optimal and coordinated care.     I spent  30  minutes in the care of the patient today including review of labs from CMP, Lipids, Thyroid Function, Hematology (current and previous including abstractions from other facilities); face-to-face time discussing  her blood glucose readings/logs, discussing hypoglycemia and hyperglycemia episodes and symptoms, medications doses, her options of short and long term treatment based on the latest standards of care / guidelines;  discussion about incorporating lifestyle medicine;  and documenting the encounter. Risk reduction counseling performed per USPSTF guidelines to reduce obesity and cardiovascular risk factors.     Please refer to Patient Instructions for Blood Glucose Monitoring and Insulin/Medications Dosing Guide"  in media tab for  additional information. Please  also refer to " Patient Self Inventory" in the Media  tab for reviewed elements of pertinent patient history.  Kenn[facility] participated in the discussions, expressed understanding, and voiced agreement with the above plans.  All questions were answered to her satisfaction. she is encouraged to contact clinic should she have any questions or concerns prior to her return visit.     Follow up plan: - Return in about 4 months (around 09/03/2023) for Diabetes F/U with A1c in office, No previsit labs, Bring meter and logs.   Ronny Bacon, [facility] [address] [place], [place] [address] Phone: [phone] Fax: [phone]  05/04/2023, 4:01 PM

## 2023-05-10 ENCOUNTER — Telehealth: Payer: Self-pay | Admitting: Internal Medicine

## 2023-05-10 NOTE — Telephone Encounter (Signed)
Pt c/o medication issue:  1. Name of Medication: icosapent Ethyl (VASCEPA) 1 g capsule   2. How are you currently taking this medication (dosage and times per day)? As prescribed   3. Are you having a reaction (difficulty breathing--STAT)? Yes   4. What is your medication issue? Patient states she is having a hard time keeping this medication down. Requesting call back to discuss.

## 2023-05-10 NOTE — Telephone Encounter (Signed)
Patient states that even with drinking lots of water when taking pill, it comes back up into her mouth. She said she can swallow all other pills without issue.   Please advise.

## 2023-05-10 NOTE — Telephone Encounter (Signed)
There is a 0.5g dose of Vascepa available as well that's smaller in size than the 1g dose. She could try taking that instead to see if she can swallow it easier. Rx would need to be to take 4 capsules twice daily though if she uses the lower 0.5g strength.

## 2023-05-11 NOTE — Telephone Encounter (Signed)
Returned call to pt. No answer. No voice mail.  

## 2023-05-13 MED ORDER — OMEGA-3-ACID ETHYL ESTERS 1 G PO CAPS: 2.0000 g | ORAL_CAPSULE | Freq: Two times a day (BID) | ORAL | 11 refills | Status: DC

## 2023-05-13 NOTE — Telephone Encounter (Signed)
Returned call to pt and informed that there is a smaller pill that she could take in place of the 1 g vascepa. Pt is asking is there something elsa she can take in place of the Vascepa. Pt reports that when she takes it and it comes back up that it is making her sick. Please advise.

## 2023-05-13 NOTE — Telephone Encounter (Signed)
Pt notified to stop taking Vascepa and to start taking Lovaza 2 g two times daily. Pt voiced understanding

## 2023-05-17 DIAGNOSIS — G4730 Sleep apnea, unspecified: Secondary | ICD-10-CM | POA: Diagnosis not present

## 2023-05-26 NOTE — Telephone Encounter (Signed)
Pt is asking for the patient assistance #- she said you gave that to her before. She wants to check the status of her pt assitance

## 2023-05-27 NOTE — Telephone Encounter (Signed)
I can see where this was sent in by Sherilyn Banker, LPN. This will be addressed with Selena Batten.

## 2023-05-31 ENCOUNTER — Telehealth: Payer: Self-pay | Admitting: Nurse Practitioner

## 2023-05-31 NOTE — Telephone Encounter (Signed)
Called and let pt know pt assistance for Tresiba, ozempic, and needles came in.

## 2023-06-01 NOTE — Telephone Encounter (Signed)
Pt assistance of tresiba, ozempic, and needles were picked up on 05/31/23.

## 2023-06-06 NOTE — Telephone Encounter (Signed)
Sent pts application again. Will call to check status.

## 2023-06-07 ENCOUNTER — Telehealth: Payer: Self-pay | Admitting: Pulmonary Disease

## 2023-06-07 ENCOUNTER — Encounter (INDEPENDENT_AMBULATORY_CARE_PROVIDER_SITE_OTHER): Payer: Medicare Other

## 2023-06-07 DIAGNOSIS — R0683 Snoring: Secondary | ICD-10-CM

## 2023-06-07 DIAGNOSIS — G4733 Obstructive sleep apnea (adult) (pediatric): Secondary | ICD-10-CM | POA: Diagnosis not present

## 2023-06-07 NOTE — Telephone Encounter (Signed)
PAP is approved for Ozempic and [place]. Pt aware shipments will come here.

## 2023-06-07 NOTE — Telephone Encounter (Signed)
HST 05/17/23 >> AHI 60, SpO2 low 50%.  Spent 162 min with SpO2 < 88%.   Please inform her that her sleep study shows severe obstructive sleep apnea.  Please arrange for ROV with me or NP to discuss treatment options.

## 2023-06-09 NOTE — Telephone Encounter (Signed)
Called the pt and there was no answer- LMTCB    

## 2023-06-14 NOTE — Telephone Encounter (Signed)
Pt. Calling back to go over hst results

## 2023-06-15 NOTE — Telephone Encounter (Signed)
Called and spoke with pt to go over hst results. Pt has been scheduled with Craige Cotta 07/12/23 to go over treatment options. She verbalized understanding, nfn at this time.

## 2023-07-05 ENCOUNTER — Ambulatory Visit (INDEPENDENT_AMBULATORY_CARE_PROVIDER_SITE_OTHER): Payer: Medicare Other | Admitting: Family Medicine

## 2023-07-05 ENCOUNTER — Encounter: Payer: Self-pay | Admitting: Family Medicine

## 2023-07-05 VITALS — BP 144/92 | HR 90 | Ht 63.0 in | Wt 244.0 lb

## 2023-07-05 DIAGNOSIS — E1122 Type 2 diabetes mellitus with diabetic chronic kidney disease: Secondary | ICD-10-CM

## 2023-07-05 DIAGNOSIS — N182 Chronic kidney disease, stage 2 (mild): Secondary | ICD-10-CM

## 2023-07-05 DIAGNOSIS — Z794 Long term (current) use of insulin: Secondary | ICD-10-CM

## 2023-07-05 DIAGNOSIS — E782 Mixed hyperlipidemia: Secondary | ICD-10-CM | POA: Diagnosis not present

## 2023-07-05 DIAGNOSIS — I10 Essential (primary) hypertension: Secondary | ICD-10-CM | POA: Diagnosis not present

## 2023-07-05 DIAGNOSIS — E7800 Pure hypercholesterolemia, unspecified: Secondary | ICD-10-CM | POA: Diagnosis not present

## 2023-07-05 DIAGNOSIS — F5104 Psychophysiologic insomnia: Secondary | ICD-10-CM

## 2023-07-05 DIAGNOSIS — F321 Major depressive disorder, single episode, moderate: Secondary | ICD-10-CM

## 2023-07-05 MED ORDER — SPIRONOLACTONE 25 MG PO TABS
25.0000 mg | ORAL_TABLET | Freq: Every day | ORAL | 4 refills | Status: DC
Start: 2023-07-05 — End: 2023-12-06

## 2023-07-05 NOTE — Patient Instructions (Signed)
F/U in 8 weeks, re evaluate blood pressure  New for blood pressure is spironolactone 25 mg daily Continue losartan 100 mg daily  Labs today ordered in 02/2023, lipid, cmp and egfrand Urine ACR  It is important that you exercise regularly at least 30 minutes 5 times a week. If you develop chest pain, have severe difficulty breathing, or feel very tired, stop exercising immediately and seek medical attention ]

## 2023-07-07 LAB — CMP14+EGFR
ALT: 22 IU/L (ref 0–32)
AST: 24 IU/L (ref 0–40)
Albumin: 4.5 g/dL (ref 3.9–4.9)
Alkaline Phosphatase: 81 IU/L (ref 44–121)
BUN/Creatinine Ratio: 13 (ref 12–28)
BUN: 12 mg/dL (ref 8–27)
Bilirubin Total: 0.2 mg/dL (ref 0.0–1.2)
CO2: 28 mmol/L (ref 20–29)
Calcium: 9.7 mg/dL (ref 8.7–10.3)
Chloride: 99 mmol/L (ref 96–106)
Creatinine, Ser: 0.94 mg/dL (ref 0.57–1.00)
Globulin, Total: 3.2 g/dL (ref 1.5–4.5)
Glucose: 110 mg/dL — ABNORMAL HIGH (ref 70–99)
Potassium: 4 mmol/L (ref 3.5–5.2)
Sodium: 140 mmol/L (ref 134–144)
Total Protein: 7.7 g/dL (ref 6.0–8.5)
eGFR: 69 mL/min/1.73

## 2023-07-11 ENCOUNTER — Encounter: Payer: Self-pay | Admitting: Family Medicine

## 2023-07-11 MED ORDER — TRAZODONE HCL 100 MG PO TABS
100.0000 mg | ORAL_TABLET | Freq: Every day | ORAL | 3 refills | Status: DC
Start: 2023-07-11 — End: 2024-02-06

## 2023-07-11 NOTE — Assessment & Plan Note (Signed)
Hyperlipidemia:Low fat diet discussed and encouraged.   Lipid Panel  Lab Results  Component Value Date   CHOL 132 07/05/2023   HDL 47 07/05/2023   LDLCALC 58 07/05/2023   TRIG 158 (H) 07/05/2023   CHOLHDL 2.8 07/05/2023     Improved , needs to reduc fat in diet

## 2023-07-11 NOTE — Assessment & Plan Note (Signed)
Improved greatly continue current med

## 2023-07-11 NOTE — Progress Notes (Signed)
Leslie Lyons     MRN: [id number]      DOB: [birth year]  Chief Complaint  Patient presents with   Follow-up    Follow up    HPI Leslie Lyons is here for follow up and re-evaluation of chronic medical conditions, medication management and review of any available recent lab and radiology data.  Preventive health is updated, specifically  Cancer screening and Immunization.   Questions or concerns regarding consultations or procedures which the PT has had in the interim are  addressed. The PT denies any adverse reactions to current medications since the last visit She states that [facility] has [facility] and doing at least 30 minutes of exercise every day.  States she feels much better and, and is following Controlled diet with improved blood sugar. Denies polyuria, polydipsia, blurred vision , or hypoglycemic episodes.     ROS Denies recent fever or chills. Denies sinus pressure, nasal congestion, ear pain or sore throat. Denies chest congestion, productive cough or wheezing. Denies chest pains, palpitations and leg swelling Denies abdominal pain, nausea, vomiting,diarrhea or constipation.   Denies dysuria, frequency, hesitancy or incontinence. Denies joint pain, swelling and limitation in mobility. Denies headaches, seizures, numbness, or tingling. Denies depression, anxiety or insomnia. Denies skin break down or rash.   PE  BP (!) 144/92   Pulse 90   Ht 5\' 3"  (1.6 m)   Wt 244 lb (110.7 kg)   SpO2 90%   BMI 43.22 kg/m   Patient alert and oriented and in no cardiopulmonary distress.  HEENT: No facial asymmetry, EOMI,     Neck supple .  Chest: Clear to auscultation bilaterally.  CVS: S1, S2 no murmurs, no S3.Regular rate.  ABD: Soft non tender.   Ext: No edema  MS: Adequate ROM spine, shoulders, hips and knees.  Skin: Intact, no ulcerations or rash noted.  Psych: Good eye contact, normal affect. Memory intact not anxious or  depressed appearing.  CNS: CN 2-12 intact, power,  normal throughout.no focal deficits noted.   Assessment & Plan  Type 2 diabetes mellitus with stage 2 chronic kidney disease, with long-term current use of insulin (HCC) Leslie Lyons is reminded of the importance of commitment to daily physical activity for 30 minutes or more, as able and the need to limit carbohydrate intake to 30 to 60 grams per meal to help with blood sugar control.   The need to take medication as prescribed, test blood sugar as directed, and to call between visits if there is a concern that blood sugar is uncontrolled is also discussed.   Leslie Lyons is reminded of the importance of daily foot exam, annual eye examination, and good blood sugar, blood pressure and cholesterol control. Uncontrolled , managed by Endo reports improvement     Latest Ref Rng & Units 07/05/2023    4:12 PM 05/04/2023    3:50 PM 02/07/2023    8:02 AM 12/21/2022    4:22 PM 10/08/2022   10:01 AM  Diabetic Labs  HbA1c 4.0 - 5.6 %  7.9   7.3    Micro/Creat Ratio 0 - 29 mg/g creat 8       Chol 100 - 199 mg/dL 409   811     HDL >91 mg/dL 47   68     Calc LDL 0 - 99 mg/dL 58   478     Triglycerides 0 - 149 mg/dL 295   621  Creatinine 0.57 - 1.00 mg/dL 1.61   0.96   0.45       07/05/2023    3:56 PM 07/05/2023    3:36 PM 07/05/2023    3:33 PM 05/04/2023    3:40 PM 05/04/2023    3:32 PM 04/28/2023   10:36 AM 04/01/2023   10:25 AM  BP/Weight  Systolic BP 144 145 163 130 151  142  Diastolic BP 92 84 90 86 80  90  Wt. (Lbs)   244  249 253.2 254  BMI   43.22 kg/m2  44.11 kg/m2 44.85 kg/m2 44.99 kg/m2      Latest Ref Rng & Units 04/14/2023   12:00 AM 08/05/2020    9:20 AM  Foot/eye exam completion dates  Eye Exam No Retinopathy No Retinopathy       Foot Form Completion   Done     This result is from an external source.        Hypercholesterolemia Hyperlipidemia:Low fat diet discussed and encouraged.   Lipid Panel  Lab Results  Component  Value Date   CHOL 132 07/05/2023   HDL 47 07/05/2023   LDLCALC 58 07/05/2023   TRIG 158 (H) 07/05/2023   CHOLHDL 2.8 07/05/2023     Improved , needs to reduc fat in diet  Depression, major, single episode, moderate (HCC) Improved greatly continue current med  Morbid obesity [facility])  Patient re-educated about  the importance of commitment to a  minimum of 150 minutes of exercise per week as able.  The importance of healthy food choices with portion control discussed, as well as eating regularly and within a 12 hour window most days. The need to choose "clean , green" food 50 to 75% of the time is discussed, as well as to make water the primary drink and set a goal of 64 ounces water daily.       07/05/2023    3:33 PM 05/04/2023    3:32 PM 04/28/2023   10:36 AM  Weight /BMI  Weight 244 lb 249 lb 253 lb 3.2 oz  Height 5\' 3"  (1.6 m) 5\' 3"  (1.6 m) 5\' 3"  (1.6 m)  BMI 43.22 kg/m2 44.11 kg/m2 44.85 kg/m2    improving  Essential hypertension, benign Uncontrolled , add spironolactone DASH diet and commitment to daily physical activity for a minimum of 30 minutes discussed and encouraged, as a part of hypertension management. The importance of attaining a healthy weight is also discussed.     07/05/2023    3:56 PM 07/05/2023    3:36 PM 07/05/2023    3:33 PM 05/04/2023    3:40 PM 05/04/2023    3:32 PM 04/28/2023   10:36 AM 04/01/2023   10:25 AM  BP/Weight  Systolic BP 144 145 163 130 151  142  Diastolic BP 92 84 90 86 80  90  Wt. (Lbs)   244  249 253.2 254  BMI   43.22 kg/m2  44.11 kg/m2 44.85 kg/m2 44.99 kg/m2

## 2023-07-11 NOTE — Assessment & Plan Note (Signed)
  Patient re-educated about  the importance of commitment to a  minimum of 150 minutes of exercise per week as able.  The importance of healthy food choices with portion control discussed, as well as eating regularly and within a 12 hour window most days. The need to choose "clean , green" food 50 to 75% of the time is discussed, as well as to make water the primary drink and set a goal of 64 ounces water daily.       07/05/2023    3:33 PM 05/04/2023    3:32 PM 04/28/2023   10:36 AM  Weight /BMI  Weight 244 lb 249 lb 253 lb 3.2 oz  Height 5\' 3"  (1.6 m) 5\' 3"  (1.6 m) 5\' 3"  (1.6 m)  BMI 43.22 kg/m2 44.11 kg/m2 44.85 kg/m2    improving

## 2023-07-11 NOTE — Assessment & Plan Note (Signed)
Uncontrolled , add spironolactone DASH diet and commitment to daily physical activity for a minimum of 30 minutes discussed and encouraged, as a part of hypertension management. The importance of attaining a healthy weight is also discussed.     07/05/2023    3:56 PM 07/05/2023    3:36 PM 07/05/2023    3:33 PM 05/04/2023    3:40 PM 05/04/2023    3:32 PM 04/28/2023   10:36 AM 04/01/2023   10:25 AM  BP/Weight  Systolic BP 144 145 163 130 151  142  Diastolic BP 92 84 90 86 80  90  Wt. (Lbs)   244  249 253.2 254  BMI   43.22 kg/m2  44.11 kg/m2 44.85 kg/m2 44.99 kg/m2

## 2023-07-11 NOTE — Assessment & Plan Note (Signed)
Leslie Lyons is reminded of the importance of commitment to daily physical activity for 30 minutes or more, as able and the need to limit carbohydrate intake to 30 to 60 grams per meal to help with blood sugar control.   The need to take medication as prescribed, test blood sugar as directed, and to call between visits if there is a concern that blood sugar is uncontrolled is also discussed.   Leslie Lyons is reminded of the importance of daily foot exam, annual eye examination, and good blood sugar, blood pressure and cholesterol control. Uncontrolled , managed by Endo reports improvement     Latest Ref Rng & Units 07/05/2023    4:12 PM 05/04/2023    3:50 PM 02/07/2023    8:02 AM 12/21/2022    4:22 PM 10/08/2022   10:01 AM  Diabetic Labs  HbA1c 4.0 - 5.6 %  7.9   7.3    Micro/Creat Ratio 0 - 29 mg/g creat 8       Chol 100 - 199 mg/dL 409   811     HDL >91 mg/dL 47   68     Calc LDL 0 - 99 mg/dL 58   478     Triglycerides 0 - 149 mg/dL 295   621     Creatinine 0.57 - 1.00 mg/dL 3.08   6.57   8.46       07/05/2023    3:56 PM 07/05/2023    3:36 PM 07/05/2023    3:33 PM 05/04/2023    3:40 PM 05/04/2023    3:32 PM 04/28/2023   10:36 AM 04/01/2023   10:25 AM  BP/Weight  Systolic BP 144 145 163 130 151  142  Diastolic BP 92 84 90 86 80  90  Wt. (Lbs)   244  249 253.2 254  BMI   43.22 kg/m2  44.11 kg/m2 44.85 kg/m2 44.99 kg/m2      Latest Ref Rng & Units 04/14/2023   12:00 AM 08/05/2020    9:20 AM  Foot/eye exam completion dates  Eye Exam No Retinopathy No Retinopathy       Foot Form Completion   Done     This result is from an external source.

## 2023-07-12 ENCOUNTER — Ambulatory Visit: Payer: Medicare Other | Admitting: Pulmonary Disease

## 2023-07-12 ENCOUNTER — Encounter: Payer: Self-pay | Admitting: Pulmonary Disease

## 2023-07-12 VITALS — BP 152/88 | HR 88 | Ht 63.0 in | Wt 248.0 lb

## 2023-07-12 DIAGNOSIS — G4734 Idiopathic sleep related nonobstructive alveolar hypoventilation: Secondary | ICD-10-CM | POA: Diagnosis not present

## 2023-07-12 DIAGNOSIS — G4733 Obstructive sleep apnea (adult) (pediatric): Secondary | ICD-10-CM

## 2023-07-12 NOTE — Progress Notes (Signed)
[facility]  Chief Complaint  Patient presents with   Follow-up    Review HST.     Past Surgical History:  She  has a past surgical history that includes Partial hysterectomy; Cryotherapy; Abdominal hysterectomy; Hemorrhoid surgery; Colonoscopy with esophagogastroduodenoscopy (egd) (11/2008); Colonoscopy with propofol (N/A, 10/08/2022); and polypectomy (10/08/2022).  Past Medical History:  Anxiety, Cervical cancer, Depression, DM type 2, Headaches, HLD, HTN, Asthma  Constitutional:  BP (!) 152/88 (BP Location: Left Arm, Cuff Size: Large)   Pulse 88   Ht 5\' 3"  (1.6 m)   Wt 248 lb (112.5 kg)   SpO2 95% Comment: on RA  BMI 43.93 kg/m   Brief Summary:  Leslie Lyons is a 62 y.o. female with obstructive sleep apnea.      Subjective:   She is here with family.  Her sleep study showed severe sleep apnea with low oxygen.  She is worried that she still has trouble sleeping and that xanax was the only medicine that worked for her before.   Physical Exam:   Appearance - well kempt   ENMT - no sinus tenderness, no oral exudate, no LAN, Mallampati 4 airway, no stridor  Respiratory - equal breath sounds bilaterally, no wheezing or rales  CV - s1s2 regular rate and rhythm, no murmurs  Ext - no clubbing, no edema  Skin - no rashes  Psych - normal mood and affect   Pulmonary testing:  Spirometry 07/09/14 >> FEV1 1.81 (83%), FEV1% 82.7  Sleep Tests:  HST 05/17/23 >> AHI 60, SpO2 low 50%. Spent 162 min with SpO2 < 88%.   Social History:  She  reports that she quit smoking about 7 years ago. Her smoking use included cigarettes. She started smoking about 9 years ago. She has a 0.5 pack-year smoking history. She has never used smokeless tobacco. She reports that she does not drink alcohol and does not use drugs.  Family History:  Her family history includes COPD in her father; Cancer in her maternal aunt, mother, and paternal  uncle; Hypertension in her father.     Assessment/Plan:   Obstructive sleep apnea with nocturnal hypoxemia. - reviewed her sleep study - discussed how sleep apnea can impact her health - driving precautions reviewed - treatment options discussed - will arrange for auto CPAP set up 5 to 20 cm H2O - will arrange for ONO with CPAP - explained she might need an in lab titration study at some point  Insomnia with anxiety and depression. - discussed proper sleep hygiene - reviewed stimulus control, sleep restriction, and relaxation techniques - defer further intervention until after she is established on therapy for sleep apnea - explained the concern about benzodiazepine use for insomnia especially in uncontrolled severe obstructive sleep apnea   Asthma. - managed by her PCP  Obesity. - discussed how weight can impact sleep and risk for sleep disordered breathing - discussed options to assist with weight loss: combination of diet modification, cardiovascular and strength training exercises  Time Spent Involved in Patient Care on Day of Examination:  27 minutes  Follow up:   Patient Instructions  Will arrange for new CPAP set up and overnight oxygen test with you using CPAP  Follow up in 4 months  Medication List:   Allergies as of 07/12/2023       Reactions   Janumet [sitagliptin-metformin Hcl]    States she vomits up the whole pill after taking this   Robaxin [methocarbamol] Swelling  Medication List        Accurate as of July 12, 2023  2:16 PM. If you have any questions, ask your nurse or doctor.          albuterol 108 (90 Base) MCG/ACT inhaler Commonly known as: VENTOLIN HFA INHALE 2 PUFFS BY MOUTH EVERY 6 HOURS AS NEEDED   blood glucose meter kit and supplies Dispense based on patient and insurance preference. Test blood sugar once daily before breakfast. (FOR ICD-10 E10.9, E11.9).   cholecalciferol 25 MCG (1000 UNIT) tablet Commonly known as:  VITAMIN D3 Take 1,000 Units by mouth daily.   Dulera 200-5 MCG/ACT Aero Generic drug: mometasone-formoterol Inhale 2 puffs into the lungs 2 (two) times daily.   losartan 100 MG tablet Commonly known as: COZAAR Take 1 tablet by mouth once daily   mirabegron ER 50 MG Tb24 tablet Commonly known as: MYRBETRIQ Take 1 tablet (50 mg total) by mouth daily.   omega-3 acid ethyl esters 1 g capsule Commonly known as: LOVAZA Take 2 capsules (2 g total) by mouth 2 (two) times daily.   onetouch ultrasoft lancets Use as instructed to monitor glucose twice daily   OneTouch Verio Flex System w/Device Kit Use to monitor glucose twice daily   OneTouch Verio test strip Generic drug: glucose blood Use as instructed to measure glucose twice daily   Ozempic (0.25 or 0.5 MG/DOSE) 2 MG/3ML Sopn Generic drug: Semaglutide(0.25 or 0.5MG /DOS) INJECT 0.25 MG SUBCUTANEOUSLY ONCE A WEEK FOR 2 WEEKS, THEN INCREASE TO 0.5 MG WEEKLY THEREAFTER   Pen Needles 32G X 4 MM Misc Use to inject insulin once daily   rosuvastatin 40 MG tablet Commonly known as: CRESTOR Take 1 tablet (40 mg total) by mouth at bedtime.   spironolactone 25 MG tablet Commonly known as: ALDACTONE Take 1 tablet (25 mg total) by mouth daily.   traZODone 100 MG tablet Commonly known as: DESYREL Take 1 tablet (100 mg total) by mouth at bedtime.   Evaristo Bury FlexTouch 100 UNIT/ML FlexTouch Pen Generic drug: insulin degludec Inject 30 Units into the skin at bedtime.        Signature:  Coralyn Helling, MD [facility] Pager - [phone] 07/12/2023, 2:16 PM

## 2023-07-12 NOTE — Patient Instructions (Signed)
Will arrange for new CPAP set up and overnight oxygen test with you using CPAP  Follow up in 4 months

## 2023-08-15 ENCOUNTER — Other Ambulatory Visit: Payer: Self-pay | Admitting: Family Medicine

## 2023-08-31 ENCOUNTER — Ambulatory Visit: Payer: Medicare Other | Admitting: Gastroenterology

## 2023-09-01 ENCOUNTER — Encounter: Payer: Self-pay | Admitting: Gastroenterology

## 2023-09-05 ENCOUNTER — Ambulatory Visit: Payer: Medicare Other | Admitting: Nurse Practitioner

## 2023-09-05 DIAGNOSIS — Z794 Long term (current) use of insulin: Secondary | ICD-10-CM

## 2023-09-05 DIAGNOSIS — I10 Essential (primary) hypertension: Secondary | ICD-10-CM

## 2023-09-05 DIAGNOSIS — E782 Mixed hyperlipidemia: Secondary | ICD-10-CM

## 2023-09-05 DIAGNOSIS — Z7985 Long-term (current) use of injectable non-insulin antidiabetic drugs: Secondary | ICD-10-CM

## 2023-09-05 DIAGNOSIS — Z7984 Long term (current) use of oral hypoglycemic drugs: Secondary | ICD-10-CM

## 2023-09-05 DIAGNOSIS — E1165 Type 2 diabetes mellitus with hyperglycemia: Secondary | ICD-10-CM

## 2023-09-06 ENCOUNTER — Telehealth: Payer: Self-pay | Admitting: Nurse Practitioner

## 2023-09-06 NOTE — Telephone Encounter (Signed)
Pts husband Picked up pt assistance of ozempic,tresiba, and pen needles

## 2023-09-06 NOTE — Telephone Encounter (Signed)
Pt notified that pt asst meds Tresiba, Ozempic, & pen needles are here for pickup.

## 2023-09-06 NOTE — Telephone Encounter (Signed)
Documented on Separate note that it was picked up

## 2023-09-07 ENCOUNTER — Ambulatory Visit: Payer: Medicare Other | Admitting: Nurse Practitioner

## 2023-09-07 ENCOUNTER — Ambulatory Visit: Payer: Medicare Other | Admitting: Family Medicine

## 2023-09-07 ENCOUNTER — Encounter: Payer: Self-pay | Admitting: Nurse Practitioner

## 2023-09-07 VITALS — BP 140/80 | Ht 63.0 in | Wt 244.0 lb

## 2023-09-07 DIAGNOSIS — E782 Mixed hyperlipidemia: Secondary | ICD-10-CM | POA: Diagnosis not present

## 2023-09-07 DIAGNOSIS — Z794 Long term (current) use of insulin: Secondary | ICD-10-CM | POA: Diagnosis not present

## 2023-09-07 DIAGNOSIS — I10 Essential (primary) hypertension: Secondary | ICD-10-CM

## 2023-09-07 DIAGNOSIS — E1165 Type 2 diabetes mellitus with hyperglycemia: Secondary | ICD-10-CM | POA: Diagnosis not present

## 2023-09-07 DIAGNOSIS — Z7985 Long-term (current) use of injectable non-insulin antidiabetic drugs: Secondary | ICD-10-CM

## 2023-09-07 LAB — POCT GLYCOSYLATED HEMOGLOBIN (HGB A1C): Hemoglobin A1C: 7.5 % — AB (ref 4.0–5.6)
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
## 2023-09-20 ENCOUNTER — Ambulatory Visit: Payer: Medicare Other | Admitting: Gastroenterology

## 2023-09-20 ENCOUNTER — Telehealth: Payer: Self-pay | Admitting: Nurse Practitioner

## 2023-09-20 ENCOUNTER — Encounter: Payer: Self-pay | Admitting: Gastroenterology

## 2023-09-20 VITALS — BP 123/75 | HR 101 | Temp 97.3°F | Ht 63.0 in | Wt 244.6 lb

## 2023-09-20 DIAGNOSIS — K7581 Nonalcoholic steatohepatitis (NASH): Secondary | ICD-10-CM | POA: Diagnosis not present

## 2023-09-20 NOTE — Patient Instructions (Signed)
Continue to do your best to control your diabetes and cholesterol. Stay as physically active as you can. Eat low fat/low sugar diet. Limit alcohol use. All of these things are good for your liver. We will have you complete liver labs in one year to follow up fatty liver. Return to the office in two years for follow up or sooner if you have any questions or concerns.

## 2023-09-20 NOTE — Telephone Encounter (Signed)
Pt made aware that her Patient Assistance is here ready for p/u

## 2023-09-20 NOTE — Progress Notes (Signed)
GI Office Note    Referring Provider: [name], MD Primary Care Physician:  [name], MD  Primary Gastroenterologist: Hennie Duos. Marletta Lor, DO   Chief Complaint   Chief Complaint  Patient presents with   Follow-up    History of Present Illness   Leslie Lyons is a 62 y.o. female presenting today for follow-up.  Last seen in the office in September 2023.  History of nonalcoholic fatty liver disease, obesity, diabetes, dyslipidemia.  Presents back for follow-up.  Been doing reasonably well.  No bowel concerns.  Very limited abdominal pain.  No reflux.  She was not sure why she was advised to come back for follow-up.  Since her last office visit she completed colonoscopy as outlined.  Abdominal ultrasound with elastography completed to evaluate history of nonalcoholic steatohepatitis.  Ultrasound unremarkable.  In 2019 she had fatty liver noted on ultrasound.  Most recent LFTs normal.  A1c slightly improved down from 7.9-->7.5.  LDL improved from 148 -->58.    Abdominal ultrasound with elastography September 2023: IMPRESSION: ULTRASOUND RUQ: Unremarkable ULTRASOUND HEPATIC ELASTOGRAPHY: Median kPa:  3.9 Diagnostic category:  < or = 5 kPa: high probability of being normal  Colonoscopy October 2023: -Hemorrhoids found on perianal exam -Nonbleeding internal hemorrhoids -Diverticulosis in the sigmoid colon -three to 7 mm polyps in the sigmoid colon, descending colon, transverse colon -5-year surveillance colonoscopy  Wt Readings from Last 3 Encounters:  09/20/23 244 lb 9.6 oz (110.9 kg)  09/07/23 244 lb (110.7 kg)  07/12/23 248 lb (112.5 kg)     Medications   Current Outpatient Medications  Medication Sig Dispense Refill   albuterol (VENTOLIN HFA) 108 (90 Base) MCG/ACT inhaler INHALE 2 PUFFS BY MOUTH EVERY 6 HOURS AS NEEDED 18 g 0   blood glucose meter kit and supplies Dispense based on patient and insurance preference. Test blood sugar once  daily before breakfast. (FOR ICD-10 E10.9, E11.9). 1 each 0   Blood Glucose Monitoring Suppl (ONETOUCH VERIO FLEX SYSTEM) w/Device KIT Use to monitor glucose twice daily 1 kit 0   cholecalciferol (VITAMIN D3) 25 MCG (1000 UNIT) tablet Take 1,000 Units by mouth daily.     glucose blood (ONETOUCH VERIO) test strip Use as instructed to measure glucose twice daily 100 each 12   insulin degludec (TRESIBA FLEXTOUCH) 100 UNIT/ML FlexTouch Pen Inject 30 Units into the skin at bedtime. 21 mL 3   Insulin Pen Needle (PEN NEEDLES) 32G X 4 MM MISC Use to inject insulin once daily 100 each 3   Lancets (ONETOUCH ULTRASOFT) lancets Use as instructed to monitor glucose twice daily 100 each 12   losartan (COZAAR) 100 MG tablet Take 1 tablet by mouth once daily 90 tablet 0   mirabegron ER (MYRBETRIQ) 50 MG TB24 tablet Take 1 tablet (50 mg total) by mouth daily. 30 tablet 5   mometasone-formoterol (DULERA) 200-5 MCG/ACT AERO Inhale 2 puffs into the lungs 2 (two) times daily. 13 g 2   omega-3 acid ethyl esters (LOVAZA) 1 g capsule Take 2 capsules (2 g total) by mouth 2 (two) times daily. 60 capsule 11   rosuvastatin (CRESTOR) 40 MG tablet Take 1 tablet (40 mg total) by mouth at bedtime. 90 tablet 3   Semaglutide,0.25 or 0.5MG /DOS, (OZEMPIC, 0.25 OR 0.5 MG/DOSE,) 2 MG/3ML SOPN INJECT 0.25 MG SUBCUTANEOUSLY ONCE A WEEK FOR 2 WEEKS, THEN INCREASE TO 0.5 MG WEEKLY THEREAFTER 3 mL 0   spironolactone (ALDACTONE) 25 MG tablet Take 1 tablet (25  mg total) by mouth daily. 30 tablet 4   traZODone (DESYREL) 100 MG tablet Take 1 tablet (100 mg total) by mouth at bedtime. 30 tablet 3   No current facility-administered medications for this visit.    Allergies   Allergies as of 09/20/2023 - Review Complete 09/20/2023  Allergen Reaction Noted   Janumet [sitagliptin-metformin hcl]  10/03/2018   Robaxin [methocarbamol] Swelling 07/31/2013          Review of Systems   General: Negative for anorexia, weight loss, fever, chills,  fatigue, weakness. ENT: Negative for hoarseness, difficulty swallowing , nasal congestion. CV: Negative for chest pain, angina, palpitations, dyspnea on exertion, peripheral edema.  Respiratory: Negative for dyspnea at rest, dyspnea on exertion, cough, sputum, wheezing.  GI: See history of present illness. GU:  Negative for dysuria, hematuria, urinary incontinence, urinary frequency, nocturnal urination.  Endo: Negative for unusual weight change.     Physical Exam   BP 123/75 (BP Location: Right Arm, Patient Position: Sitting, Cuff Size: Large)   Pulse (!) 101   Temp (!) 97.3 F (36.3 C) (Oral)   Ht 5\' 3"  (1.6 m)   Wt 244 lb 9.6 oz (110.9 kg)   BMI 43.33 kg/m    General: Well-nourished, well-developed in no acute distress.  Eyes: No icterus. Mouth: Oropharyngeal mucosa moist and pink  Abdomen: Bowel sounds are normal, nontender, nondistended, no hepatosplenomegaly or masses,  no abdominal bruits or hernia , no rebound or guarding.  Rectal: Not performed Extremities: No lower extremity edema. No clubbing or deformities. Neuro: Alert and oriented x 4   Skin: Warm and dry, no jaundice.   Psych: Alert and cooperative, normal mood and affect.  Labs   Lab Results  Component Value Date   NA 140 07/05/2023   CL 99 07/05/2023   K 4.0 07/05/2023   CO2 28 07/05/2023   BUN 12 07/05/2023   CREATININE 0.94 07/05/2023   EGFR 69 07/05/2023   CALCIUM 9.7 07/05/2023   PHOS 2.4 (L) 04/23/2020   ALBUMIN 4.5 07/05/2023   GLUCOSE 110 (H) 07/05/2023   Lab Results  Component Value Date   ALT 22 07/05/2023   AST 24 07/05/2023   ALKPHOS 81 07/05/2023   BILITOT 0.2 07/05/2023   Lab Results  Component Value Date   WBC 11.1 (H) 02/07/2023   HGB 13.9 02/07/2023   HCT 42.8 02/07/2023   MCV 85 02/07/2023   PLT 166 02/07/2023   Lab Results  Component Value Date   HGBA1C 7.5 (A) 09/07/2023    Imaging Studies   No results found.  Assessment/Plan:   MASH (formerly NASH) -abd u/s  with elastography reassuring with low median kPa -LFTs normal -LDL controlled -A1C slightly better -weight obese, but stable -recommend low fat/low sugar diet, daily exercise/walking, limited etoh, tight glycemic control -recheck labs in one year, to include fibrosis scoring -return ov in two years.      Leanna Battles. Melvyn Neth, MHS, PA-C [facility]

## 2023-09-21 ENCOUNTER — Other Ambulatory Visit: Payer: Self-pay | Admitting: Nurse Practitioner

## 2023-09-27 ENCOUNTER — Ambulatory Visit: Payer: Medicare Other | Admitting: Urology

## 2023-10-03 ENCOUNTER — Ambulatory Visit: Payer: Medicare Other | Admitting: Internal Medicine

## 2023-10-04 ENCOUNTER — Ambulatory Visit: Payer: Medicare Other | Attending: Internal Medicine | Admitting: Internal Medicine

## 2023-10-04 ENCOUNTER — Encounter: Payer: Self-pay | Admitting: Internal Medicine

## 2023-10-04 VITALS — BP 128/84 | HR 94 | Ht 63.0 in | Wt 246.0 lb

## 2023-10-04 DIAGNOSIS — I10 Essential (primary) hypertension: Secondary | ICD-10-CM

## 2023-10-04 NOTE — Progress Notes (Signed)
Cardiology Office Note  Date: 10/04/2023   ID: GIRTHA KILGORE, DOB 05/21/1961, MRN [id number]  PCP:  [name], MD  Cardiologist:  Marjo Bicker, MD Electrophysiologist:  None    History of Present Illness: Leslie Lyons is a 62 y.o. female known to have HTN, DM 2, HLD is here for follow-up visit.  Overall doing great, no symptoms.  No angina, DOE, dizziness, syncope, palpitations.  Has minimal leg swelling.  Works out few times per week in [employer].  Did not tolerate Vascepa.  Has insurance issues with bempedoic acid.  Past Medical History:  Diagnosis Date   Abnormal transaminases    Anxiety    Bladder incontinence    Bronchial asthma    with acute exacerbation   Cancer (HCC) 1980   cervical, diag  at age 65   Depression    Diabetes (HCC)    Diabetes mellitus, type II (HCC)    Headache(784.0)    with reduced vision in right eye for one month    Hyperlipidemia    Hypertension    Obesity     Past Surgical History:  Procedure Laterality Date   ABDOMINAL HYSTERECTOMY     COLONOSCOPY WITH ESOPHAGOGASTRODUODENOSCOPY (EGD)  11/2008   Dr. Lovell Sheehan: Conscious sedation.  Hiatal hernia.  Hemorrhoids. PATIENT REPORTS BEING AWAKE DURING PROCEDURE   COLONOSCOPY WITH PROPOFOL N/A 10/08/2022   Procedure: COLONOSCOPY WITH PROPOFOL;  Surgeon: Lanelle Bal, DO;  [facility];  Service: Endoscopy;  Laterality: N/A;  7:30 AM   CRYOTHERAPY     for abnormal pap   HEMORRHOID SURGERY     dr. Elpidio Anis   PARTIAL HYSTERECTOMY     POLYPECTOMY  10/08/2022   Procedure: POLYPECTOMY;  Surgeon: Lanelle Bal, DO;  Location: [facility];  Service: Endoscopy;;    Current Outpatient Medications  Medication Sig Dispense Refill   albuterol (VENTOLIN HFA) 108 (90 Base) MCG/ACT inhaler INHALE 2 PUFFS BY MOUTH EVERY 6 HOURS AS NEEDED 18 g 0   blood glucose meter kit and supplies Dispense based on patient and insurance preference. Test blood sugar once  daily before breakfast. (FOR ICD-10 E10.9, E11.9). 1 each 0   Blood Glucose Monitoring Suppl (ONETOUCH VERIO FLEX SYSTEM) w/Device KIT Use to monitor glucose twice daily 1 kit 0   cholecalciferol (VITAMIN D3) 25 MCG (1000 UNIT) tablet Take 1,000 Units by mouth daily.     glucose blood (ONETOUCH VERIO) test strip USE 1 STRIP TO CHECK GLUCOSE TWICE DAILY 100 each 0   insulin degludec (TRESIBA FLEXTOUCH) 100 UNIT/ML FlexTouch Pen Inject 30 Units into the skin at bedtime. 21 mL 3   Insulin Pen Needle (PEN NEEDLES) 32G X 4 MM MISC Use to inject insulin once daily 100 each 3   Lancets (ONETOUCH ULTRASOFT) lancets Use as instructed to monitor glucose twice daily 100 each 12   losartan (COZAAR) 100 MG tablet Take 1 tablet by mouth once daily 90 tablet 0   mirabegron ER (MYRBETRIQ) 50 MG TB24 tablet Take 1 tablet (50 mg total) by mouth daily. 30 tablet 5   mometasone-formoterol (DULERA) 200-5 MCG/ACT AERO Inhale 2 puffs into the lungs 2 (two) times daily. 13 g 2   omega-3 acid ethyl esters (LOVAZA) 1 g capsule Take 2 capsules (2 g total) by mouth 2 (two) times daily. 60 capsule 11   rosuvastatin (CRESTOR) 40 MG tablet Take 1 tablet (40 mg total) by mouth at bedtime. 90 tablet 3  Semaglutide,0.25 or 0.5MG /DOS, (OZEMPIC, 0.25 OR 0.5 MG/DOSE,) 2 MG/3ML SOPN INJECT 0.25 MG SUBCUTANEOUSLY ONCE A WEEK FOR 2 WEEKS, THEN INCREASE TO 0.5 MG WEEKLY THEREAFTER 3 mL 0   spironolactone (ALDACTONE) 25 MG tablet Take 1 tablet (25 mg total) by mouth daily. 30 tablet 4   traZODone (DESYREL) 100 MG tablet Take 1 tablet (100 mg total) by mouth at bedtime. 30 tablet 3   No current facility-administered medications for this visit.   Allergies:  Janumet [sitagliptin-metformin hcl] and Robaxin [methocarbamol]   Social History: The patient  reports that she quit smoking about 7 years ago. Her smoking use included cigarettes. She started smoking about 9 years ago. She has a 0.5 pack-year smoking history. She has never used  smokeless tobacco. She reports that she does not drink alcohol and does not use drugs.   Family History: The patient's family history includes COPD in her father; Cancer in her maternal aunt, mother, and paternal uncle; Hypertension in her father.   ROS:  Please see the history of present illness. Otherwise, complete review of systems is positive for none.  All other systems are reviewed and negative.   Physical Exam: VS:  BP 128/84   Pulse 94   Ht 5\' 3"  (1.6 m)   Wt 246 lb (111.6 kg)   SpO2 95%   BMI 43.58 kg/m , BMI Body mass index is 43.58 kg/m.  Wt Readings from Last 3 Encounters:  10/04/23 246 lb (111.6 kg)  09/20/23 244 lb 9.6 oz (110.9 kg)  09/07/23 244 lb (110.7 kg)    General: Patient appears comfortable at rest. HEENT: Conjunctiva and lids normal, oropharynx clear with moist mucosa. Neck: Supple, no elevated JVP or carotid bruits, no thyromegaly. Lungs: Clear to auscultation, nonlabored breathing at rest. Cardiac: Regular rate and rhythm, no S3 or significant systolic murmur, no pericardial rub. Abdomen: Soft, nontender, no hepatomegaly, bowel sounds present, no guarding or rebound. Extremities: No pitting edema, distal pulses 2+. Skin: Warm and dry. Musculoskeletal: No kyphosis. Neuropsychiatric: Alert and oriented x3, affect grossly appropriate.  Recent Labwork: 02/07/2023: Hemoglobin 13.9; Platelets 166; TSH 2.730 07/05/2023: ALT 22; AST 24; BUN 12; Creatinine, Ser 0.94; Potassium 4.0; Sodium 140     Component Value Date/Time   CHOL 132 07/05/2023 1612   TRIG 158 (H) 07/05/2023 1612   HDL 47 07/05/2023 1612   CHOLHDL 2.8 07/05/2023 1612   CHOLHDL 2.9 08/05/2020 1107   VLDL 25 08/05/2020 1107   LDLCALC 58 07/05/2023 1612   LDLCALC 169 (H) 07/13/2019 0816    Assessment and Plan:  HLD, at goal Hypertriglyceridemia, at goal HTN, controlled Morbid obesity with BMI 43.58   -I reviewed the lipid panel from 06/2021 that showed LDL 58, TG 158 and total  cholesterol 132.  LDL and TG levels significantly improved compared to the previous lipid panel results.  Aggressive DM2 control recommended.  Continue current medications, rosuvastatin 40 mg nightly and Lovaza 2 g twice daily.  Did not tolerate Vascepa. Insurance issues with bempedoic acid.  On Ozempic now, has not lost much weight.  She works out by going to [employer] few times per week.  Overall doing great, no symptoms.  Return back to follow-up in 1 year.   I have spent a total of 20 minutes with patient reviewing chart, EKGs, labs and examining patient as well as establishing an assessment and plan that was discussed with the patient.    Disposition:  Follow up  1 year  Signed, Geniva Lohnes Priya Nardos Putnam,  MD, 10/04/2023 9:48 AM     [facility] [address] [place], [place] [address]

## 2023-10-04 NOTE — Patient Instructions (Signed)
Medication Instructions:  Your physician recommends that you continue on your current medications as directed. Please refer to the Current Medication list given to you today.  *If you need a refill on your cardiac medications before your next appointment, please call your pharmacy*   Lab Work: NONE   If you have labs (blood work) drawn today and your tests are completely normal, you will receive your results only by: MyChart Message (if you have MyChart) OR A paper copy in the mail If you have any lab test that is abnormal or we need to change your treatment, we will call you to review the results.   Testing/Procedures: NONE    Follow-Up: At [facility], you and your health needs are our priority.  As part of our continuing mission to provide you with exceptional heart care, we have created designated Provider Care Teams.  These Care Teams include your primary Cardiologist (physician) and Advanced Practice Providers (APPs -  Physician Assistants and Nurse Practitioners) who all work together to provide you with the care you need, when you need it.  We recommend signing up for the patient portal called "MyChart".  Sign up information is provided on this After Visit Summary.  MyChart is used to connect with patients for Virtual Visits (Telemedicine).  Patients are able to view lab/test results, encounter notes, upcoming appointments, etc.  Non-urgent messages can be sent to your provider as well.   To learn more about what you can do with MyChart, go to [email or web address].    Your next appointment:   1 year(s)  Provider:   You may see [name] [name], MD or one of the following Advanced Practice Providers on your designated Care Team:   Brittany Strader, PA-C  Michele Lenze, PA-C     Other Instructions Thank you for choosing [facility]!    

## 2023-10-12 ENCOUNTER — Encounter: Payer: Self-pay | Admitting: Family Medicine

## 2023-10-12 ENCOUNTER — Ambulatory Visit (INDEPENDENT_AMBULATORY_CARE_PROVIDER_SITE_OTHER): Payer: Medicare Other | Admitting: Family Medicine

## 2023-10-12 VITALS — BP 125/78 | HR 104 | Ht 63.0 in | Wt 245.0 lb

## 2023-10-12 DIAGNOSIS — N182 Chronic kidney disease, stage 2 (mild): Secondary | ICD-10-CM | POA: Diagnosis not present

## 2023-10-12 DIAGNOSIS — Z23 Encounter for immunization: Secondary | ICD-10-CM

## 2023-10-12 DIAGNOSIS — E1122 Type 2 diabetes mellitus with diabetic chronic kidney disease: Secondary | ICD-10-CM

## 2023-10-12 DIAGNOSIS — E7800 Pure hypercholesterolemia, unspecified: Secondary | ICD-10-CM | POA: Diagnosis not present

## 2023-10-12 DIAGNOSIS — F321 Major depressive disorder, single episode, moderate: Secondary | ICD-10-CM

## 2023-10-12 DIAGNOSIS — E559 Vitamin D deficiency, unspecified: Secondary | ICD-10-CM | POA: Diagnosis not present

## 2023-10-12 DIAGNOSIS — I10 Essential (primary) hypertension: Secondary | ICD-10-CM | POA: Diagnosis not present

## 2023-10-12 DIAGNOSIS — R32 Unspecified urinary incontinence: Secondary | ICD-10-CM

## 2023-10-12 DIAGNOSIS — E785 Hyperlipidemia, unspecified: Secondary | ICD-10-CM

## 2023-10-12 DIAGNOSIS — Z794 Long term (current) use of insulin: Secondary | ICD-10-CM | POA: Diagnosis not present

## 2023-10-12 DIAGNOSIS — E1165 Type 2 diabetes mellitus with hyperglycemia: Secondary | ICD-10-CM

## 2023-10-12 DIAGNOSIS — F5104 Psychophysiologic insomnia: Secondary | ICD-10-CM

## 2023-10-12 MED ORDER — MYRBETRIQ 50 MG PO TB24: 50.0000 mg | ORAL_TABLET | Freq: Every day | ORAL | 5 refills | Status: DC

## 2023-10-12 NOTE — Patient Instructions (Addendum)
Annual exam March 10 or after, call if you need me sooner  Pneumonia 20 and flu vaccine today  Nurse pls enter covid and Shingrix 1, has had both  You need Shingricx #2 and TdaP  You need RSV vaccine  Good foot exam  Patient in donut hole requesting help with mirabegron, costs $121/ month,Nurse please assist with this ( let me know how I can help)  Fasting lipid, cmp and eGFr, CBC, TSH and vit D to be drawn 5 to 7 days before March appointment  Keep up the great work!  Best for 2025!  Thanks for choosing [facility], we consider it a privelige to serve you.

## 2023-10-24 DIAGNOSIS — Z23 Encounter for immunization: Secondary | ICD-10-CM | POA: Insufficient documentation

## 2023-10-24 NOTE — Assessment & Plan Note (Signed)
Leslie Lyons is reminded of the importance of commitment to daily physical activity for 30 minutes or more, as able and the need to limit carbohydrate intake to 30 to 60 grams per meal to help with blood sugar control.   The need to take medication as prescribed, test blood sugar as directed, and to call between visits if there is a concern that blood sugar is uncontrolled is also discussed.   Leslie Lyons is reminded of the importance of daily foot exam, annual eye examination, and good blood sugar, blood pressure and cholesterol control. Managed bty Endo, fair control    Latest Ref Rng & Units 09/07/2023    2:58 PM 07/05/2023    4:12 PM 05/04/2023    3:50 PM 02/07/2023    8:02 AM 12/21/2022    4:22 PM  Diabetic Labs  HbA1c 4.0 - 5.6 % 7.5   7.9   7.3   Micro/Creat Ratio 0 - 29 mg/g creat  8      Chol 100 - 199 mg/dL  782   956    HDL >21 mg/dL  47   68    Calc LDL 0 - 99 mg/dL  58   308    Triglycerides 0 - 149 mg/dL  657   846    Creatinine 0.57 - 1.00 mg/dL  9.62   9.52        84/13/2440    9:11 AM 10/12/2023    9:10 AM 10/04/2023    9:22 AM 09/20/2023    2:13 PM 09/07/2023    2:46 PM 09/07/2023    2:38 PM 07/12/2023    1:34 PM  BP/Weight  Systolic BP 125 141 128 123 140 164 152  Diastolic BP 78 80 84 75 80 83 88  Wt. (Lbs)  245 246 244.6  244 248  BMI  43.4 kg/m2 43.58 kg/m2 43.33 kg/m2  43.22 kg/m2 43.93 kg/m2      Latest Ref Rng & Units 10/12/2023    9:00 AM 04/14/2023   12:00 AM  Foot/eye exam completion dates  Eye Exam No Retinopathy  No Retinopathy      Foot Form Completion  Done      This result is from an external source.

## 2023-10-24 NOTE — Assessment & Plan Note (Signed)
Good respomnse to medication but currently in donut hole will send mssage to pharmacy for help idf possible

## 2023-10-24 NOTE — Assessment & Plan Note (Signed)
After obtaining informed consent, the influenza and pneumonia  vaccines are   administered , with no adverse effect noted at the time of administration.

## 2023-10-24 NOTE — Assessment & Plan Note (Signed)
Controlled, no change in medication DASH diet and commitment to daily physical activity for a minimum of 30 minutes discussed and encouraged, as a part of hypertension management. The importance of attaining a healthy weight is also discussed.     10/12/2023    9:11 AM 10/12/2023    9:10 AM 10/04/2023    9:22 AM 09/20/2023    2:13 PM 09/07/2023    2:46 PM 09/07/2023    2:38 PM 07/12/2023    1:34 PM  BP/Weight  Systolic BP 125 141 128 123 140 164 152  Diastolic BP 78 80 84 75 80 83 88  Wt. (Lbs)  245 246 244.6  244 248  BMI  43.4 kg/m2 43.58 kg/m2 43.33 kg/m2  43.22 kg/m2 43.93 kg/m2

## 2023-10-24 NOTE — Progress Notes (Signed)
Leslie Lyons     MRN: [id number]      DOB: [birth year]  Chief Complaint  Patient presents with   Follow-up    Follow up, needs shingles flu and tdap     HPI Leslie Lyons is here for follow up and re-evaluation of chronic medical conditions, medication management and review of any available recent lab and radiology data.  Preventive health is updated, specifically  Cancer screening and Immunization.   Questions or concerns regarding consultations or procedures which the PT has had in the interim are  addressed. The PT denies any adverse reactions to current medications since the last visit.  There are no new concerns.  There are no specific complaints   ROS Denies recent fever or chills. Denies sinus pressure, nasal congestion, ear pain or sore throat. Denies chest congestion, productive cough or wheezing. Denies chest pains, palpitations and leg swelling Denies abdominal pain, nausea, vomiting,diarrhea or constipation.   Denies dysuria, frequency, hesitancy or incontinence. Denies joint pain, swelling and limitation in mobility. Denies headaches, seizures, numbness, or tingling. Denies depression, anxiety or insomnia. Denies skin break down or rash.   PE  BP 125/78 (BP Location: Left Arm, Patient Position: Sitting, Cuff Size: Large)   Pulse (!) 104   Ht 5\' 3"  (1.6 m)   Wt 245 lb (111.1 kg)   SpO2 91%   BMI 43.40 kg/m   Patient alert and oriented and in no cardiopulmonary distress.  HEENT: No facial asymmetry, EOMI,     Neck supple .  Chest: Clear to auscultation bilaterally.  CVS: S1, S2 no murmurs, no S3.Regular rate.  ABD: Soft non tender.   Ext: No edema  MS: Adequate ROM spine, shoulders, hips and knees.  Skin: Intact, no ulcerations or rash noted.  Psych: Good eye contact, normal affect. Memory intact not anxious or depressed appearing.  CNS: CN 2-12 intact, power,  normal throughout.no focal deficits noted.   Assessment & Plan  Essential  hypertension, benign Controlled, no change in medication DASH diet and commitment to daily physical activity for a minimum of 30 minutes discussed and encouraged, as a part of hypertension management. The importance of attaining a healthy weight is also discussed.     10/12/2023    9:11 AM 10/12/2023    9:10 AM 10/04/2023    9:22 AM 09/20/2023    2:13 PM 09/07/2023    2:46 PM 09/07/2023    2:38 PM 07/12/2023    1:34 PM  BP/Weight  Systolic BP 125 141 128 123 140 164 152  Diastolic BP 78 80 84 75 80 83 88  Wt. (Lbs)  245 246 244.6  244 248  BMI  43.4 kg/m2 43.58 kg/m2 43.33 kg/m2  43.22 kg/m2 43.93 kg/m2       Depression, major, single episode, moderate (HCC) Controlled, no change in medication   Insomnia Controlled, no change in medication Sleep hygiene reviewed and written information offered also. Prescription sent for  medication needed.   Hypercholesterolemia Hyperlipidemia:Low fat diet discussed and encouraged.  Needs to lower tG Lipid Panel  Lab Results  Component Value Date   CHOL 132 07/05/2023   HDL 47 07/05/2023   LDLCALC 58 07/05/2023   TRIG 158 (H) 07/05/2023   CHOLHDL 2.8 07/05/2023       Type 2 diabetes mellitus with stage 2 chronic kidney disease, with long-term current use of insulin (HCC) Leslie Lyons is reminded of the importance of commitment to daily physical activity for 30 minutes or more,  as able and the need to limit carbohydrate intake to 30 to 60 grams per meal to help with blood sugar control.   The need to take medication as prescribed, test blood sugar as directed, and to call between visits if there is a concern that blood sugar is uncontrolled is also discussed.   Leslie Lyons is reminded of the importance of daily foot exam, annual eye examination, and good blood sugar, blood pressure and cholesterol control. Managed bty Endo, fair control    Latest Ref Rng & Units 09/07/2023    2:58 PM 07/05/2023    4:12 PM 05/04/2023    3:50 PM  02/07/2023    8:02 AM 12/21/2022    4:22 PM  Diabetic Labs  HbA1c 4.0 - 5.6 % 7.5   7.9   7.3   Micro/Creat Ratio 0 - 29 mg/g creat  8      Chol 100 - 199 mg/dL  161   096    HDL >04 mg/dL  47   68    Calc LDL 0 - 99 mg/dL  58   540    Triglycerides 0 - 149 mg/dL  981   191    Creatinine 0.57 - 1.00 mg/dL  4.78   2.95        62/13/0865    9:11 AM 10/12/2023    9:10 AM 10/04/2023    9:22 AM 09/20/2023    2:13 PM 09/07/2023    2:46 PM 09/07/2023    2:38 PM 07/12/2023    1:34 PM  BP/Weight  Systolic BP 125 141 128 123 140 164 152  Diastolic BP 78 80 84 75 80 83 88  Wt. (Lbs)  245 246 244.6  244 248  BMI  43.4 kg/m2 43.58 kg/m2 43.33 kg/m2  43.22 kg/m2 43.93 kg/m2      Latest Ref Rng & Units 10/12/2023    9:00 AM 04/14/2023   12:00 AM  Foot/eye exam completion dates  Eye Exam No Retinopathy  No Retinopathy      Foot Form Completion  Done      This result is from an external source.        Encounter for immunization After obtaining informed consent, the influenza and pneumonia  vaccines are   administered , with no adverse effect noted at the time of administration.   Urinary incontinence Good respomnse to medication but currently in donut hole will send mssage to pharmacy for help idf possible

## 2023-10-24 NOTE — Assessment & Plan Note (Signed)
Controlled, no change in medication Sleep hygiene reviewed and written information offered also. Prescription sent for  medication needed.

## 2023-10-24 NOTE — Assessment & Plan Note (Signed)
Controlled, no change in medication  

## 2023-10-24 NOTE — Assessment & Plan Note (Signed)
Hyperlipidemia:Low fat diet discussed and encouraged.  Needs to lower tG Lipid Panel  Lab Results  Component Value Date   CHOL 132 07/05/2023   HDL 47 07/05/2023   LDLCALC 58 07/05/2023   TRIG 158 (H) 07/05/2023   CHOLHDL 2.8 07/05/2023

## 2023-10-31 ENCOUNTER — Other Ambulatory Visit: Payer: Self-pay | Admitting: Nurse Practitioner

## 2023-11-07 ENCOUNTER — Emergency Department (HOSPITAL_COMMUNITY): Payer: Medicare Other

## 2023-11-07 ENCOUNTER — Observation Stay (HOSPITAL_COMMUNITY)
Admission: EM | Admit: 2023-11-07 | Discharge: 2023-11-09 | Disposition: A | Discharge status: Home / Self Care | Payer: Medicare Other | Attending: Family Medicine | Admitting: Family Medicine

## 2023-11-07 ENCOUNTER — Telehealth: Payer: Self-pay

## 2023-11-07 ENCOUNTER — Other Ambulatory Visit: Payer: Self-pay

## 2023-11-07 ENCOUNTER — Encounter (HOSPITAL_COMMUNITY): Payer: Self-pay | Admitting: *Deleted

## 2023-11-07 DIAGNOSIS — E119 Type 2 diabetes mellitus without complications: Secondary | ICD-10-CM | POA: Insufficient documentation

## 2023-11-07 DIAGNOSIS — E785 Hyperlipidemia, unspecified: Secondary | ICD-10-CM | POA: Diagnosis not present

## 2023-11-07 DIAGNOSIS — I959 Hypotension, unspecified: Secondary | ICD-10-CM | POA: Diagnosis not present

## 2023-11-07 DIAGNOSIS — Z79899 Other long term (current) drug therapy: Secondary | ICD-10-CM | POA: Insufficient documentation

## 2023-11-07 DIAGNOSIS — K529 Noninfective gastroenteritis and colitis, unspecified: Principal | ICD-10-CM | POA: Insufficient documentation

## 2023-11-07 DIAGNOSIS — R197 Diarrhea, unspecified: Secondary | ICD-10-CM | POA: Diagnosis not present

## 2023-11-07 DIAGNOSIS — Z8541 Personal history of malignant neoplasm of cervix uteri: Secondary | ICD-10-CM | POA: Insufficient documentation

## 2023-11-07 DIAGNOSIS — R109 Unspecified abdominal pain: Secondary | ICD-10-CM | POA: Insufficient documentation

## 2023-11-07 DIAGNOSIS — N179 Acute kidney failure, unspecified: Secondary | ICD-10-CM | POA: Insufficient documentation

## 2023-11-07 DIAGNOSIS — I10 Essential (primary) hypertension: Secondary | ICD-10-CM | POA: Diagnosis not present

## 2023-11-07 DIAGNOSIS — R1084 Generalized abdominal pain: Secondary | ICD-10-CM

## 2023-11-07 DIAGNOSIS — Z1152 Encounter for screening for COVID-19: Secondary | ICD-10-CM | POA: Insufficient documentation

## 2023-11-07 DIAGNOSIS — J45909 Unspecified asthma, uncomplicated: Secondary | ICD-10-CM | POA: Diagnosis not present

## 2023-11-07 DIAGNOSIS — G4733 Obstructive sleep apnea (adult) (pediatric): Secondary | ICD-10-CM | POA: Diagnosis not present

## 2023-11-07 DIAGNOSIS — Z794 Long term (current) use of insulin: Secondary | ICD-10-CM | POA: Insufficient documentation

## 2023-11-07 DIAGNOSIS — Z87891 Personal history of nicotine dependence: Secondary | ICD-10-CM | POA: Insufficient documentation

## 2023-11-07 LAB — RESP PANEL BY RT-PCR (RSV, FLU A&B, COVID)  RVPGX2
Influenza A by PCR: NEGATIVE
Influenza B by PCR: NEGATIVE
Resp Syncytial Virus by PCR: NEGATIVE
SARS Coronavirus 2 by RT PCR: NEGATIVE

## 2023-11-07 LAB — URINALYSIS, ROUTINE W REFLEX MICROSCOPIC
Bacteria, UA: NONE SEEN
Bilirubin Urine: NEGATIVE
Glucose, UA: NEGATIVE mg/dL
Hgb urine dipstick: NEGATIVE
Ketones, ur: NEGATIVE mg/dL
Leukocytes,Ua: NEGATIVE
Nitrite: NEGATIVE
Protein, ur: 30 mg/dL — AB
Specific Gravity, Urine: 1.012 (ref 1.005–1.030)
pH: 5 (ref 5.0–8.0)

## 2023-11-07 LAB — COMPREHENSIVE METABOLIC PANEL
ALT: 25 U/L (ref 0–44)
AST: 26 U/L (ref 15–41)
Albumin: 4.5 g/dL (ref 3.5–5.0)
Alkaline Phosphatase: 67 U/L (ref 38–126)
Anion gap: 11 (ref 5–15)
BUN: 16 mg/dL (ref 8–23)
CO2: 24 mmol/L (ref 22–32)
Calcium: 9.2 mg/dL (ref 8.9–10.3)
Chloride: 101 mmol/L (ref 98–111)
Creatinine, Ser: 1.41 mg/dL — ABNORMAL HIGH (ref 0.44–1.00)
GFR, Estimated: 42 mL/min — ABNORMAL LOW (ref >60)
Glucose, Bld: 120 mg/dL — ABNORMAL HIGH (ref 70–99)
Potassium: 3.8 mmol/L (ref 3.5–5.1)
Sodium: 136 mmol/L (ref 135–145)
Total Bilirubin: 0.6 mg/dL (ref <1.2)
Total Protein: 8.6 g/dL — ABNORMAL HIGH (ref 6.5–8.1)

## 2023-11-07 LAB — CBC
HCT: 43.3 % (ref 36.0–46.0)
Hemoglobin: 13.3 g/dL (ref 12.0–15.0)
MCH: 26.8 pg (ref 26.0–34.0)
MCHC: 30.7 g/dL (ref 30.0–36.0)
MCV: 87.1 fL (ref 80.0–100.0)
Platelets: 151 10*3/uL (ref 150–400)
RBC: 4.97 MIL/uL (ref 3.87–5.11)
RDW: 13.5 % (ref 11.5–15.5)
WBC: 11.2 10*3/uL — ABNORMAL HIGH (ref 4.0–10.5)
nRBC: 0 % (ref 0.0–0.2)

## 2023-11-07 LAB — LIPASE, BLOOD: Lipase: 44 U/L (ref 11–51)

## 2023-11-07 MED ORDER — METRONIDAZOLE 500 MG PO TABS
500.0000 mg | ORAL_TABLET | Freq: Once | ORAL | Status: AC
  Administered 2023-11-07: 500 mg via ORAL
  Filled 2023-11-07: qty 1

## 2023-11-07 MED ORDER — SODIUM CHLORIDE 0.9 % IV BOLUS
1000.0000 mL | Freq: Once | INTRAVENOUS | Status: AC
  Administered 2023-11-07: 1000 mL via INTRAVENOUS

## 2023-11-07 MED ORDER — CIPROFLOXACIN HCL 250 MG PO TABS
500.0000 mg | ORAL_TABLET | Freq: Once | ORAL | Status: AC
  Administered 2023-11-07: 500 mg via ORAL
  Filled 2023-11-07: qty 2

## 2023-11-07 MED ORDER — IOHEXOL 300 MG/ML  SOLN
80.0000 mL | Freq: Once | INTRAMUSCULAR | Status: AC | PRN
  Administered 2023-11-07: 80 mL via INTRAVENOUS

## 2023-11-07 NOTE — Progress Notes (Unsigned)
   Established Patient Office Visit   Subjective  Patient ID: Leslie Lyons, female    DOB: 02/12/1961  Age: 62 y.o. MRN: [id number]  No chief complaint on file.   She  has a past medical history of Abnormal transaminases, Anxiety, Bladder incontinence, Bronchial asthma, Cancer (HCC) (1980), Depression, Diabetes (HCC), Diabetes mellitus, type II (HCC), Headache(784.0), Hyperlipidemia, Hypertension, and Obesity.  Diarrhea  Associated symptoms include headaches.  Headache     Review of Systems  Gastrointestinal:  Positive for diarrhea.  Neurological:  Positive for headaches.      Objective:     There were no vitals taken for this visit. {Vitals History (Optional):23777}  Physical Exam   No results found for any visits on 11/08/23.  The 10-year ASCVD risk score (Arnett DK, et al., 2019) is: 12.2%    Assessment & Plan:  There are no diagnoses linked to this encounter.  No follow-ups on file.   Cruzita Lederer Newman Nip, FNP

## 2023-11-07 NOTE — ED Notes (Signed)
Pt off to CT.

## 2023-11-07 NOTE — ED Notes (Signed)
After 2L of NS BP not improved PA to admit

## 2023-11-07 NOTE — ED Provider Notes (Signed)
[place] EMERGENCY DEPARTMENT AT [facility] Provider Note   CSN: [id number] Arrival date & time: 11/07/23  1448     History  Chief Complaint  Patient presents with   Abdominal Pain    Leslie Lyons is a 62 y.o. female.  Patient to ED with c/o abdominal pain from the epigastric area to lower abdomen for the past 4 days. She reports multiple episodes diarrhea daily, nonbloody. No fever. No nausea or vomiting. No sick contacts. No recent antibiotic use.  She is using Imodium at home without relief. No previous abdominal surgeries.   The history is provided by the patient. No language interpreter was used.  Abdominal Pain      Home Medications Prior to Admission medications   Medication Sig Start Date End Date Taking? Authorizing Provider  albuterol (VENTOLIN HFA) 108 (90 Base) MCG/ACT inhaler INHALE 2 PUFFS BY MOUTH EVERY 6 HOURS AS NEEDED 12/29/22   [name], MD  blood glucose meter kit and supplies Dispense based on patient and insurance preference. Test blood sugar once daily before breakfast. (FOR ICD-10 E10.9, E11.9). 10/30/20   [name], MD  Blood Glucose Monitoring Suppl (ONETOUCH VERIO FLEX SYSTEM) w/Device KIT Use to monitor glucose twice daily 04/23/22   [name], NP  cholecalciferol (VITAMIN D3) 25 MCG (1000 UNIT) tablet Take 1,000 Units by mouth daily.    [provider]  glucose blood (ONETOUCH VERIO) test strip USE 1 STRIP TO CHECK GLUCOSE TWICE DAILY 09/21/23   [name], NP  insulin degludec (TRESIBA FLEXTOUCH) 100 UNIT/ML FlexTouch Pen Inject 30 Units into the skin at bedtime. 05/04/23   [name], NP  Insulin Pen Needle (BD PEN NEEDLE NANO 2ND GEN) 32G X 4 MM MISC USE TO INJECT INSULIN ONCE DAILY 11/01/23   [name], NP  Lancets [facility] ULTRASOFT) lancets Use as instructed to monitor glucose twice daily 04/23/22   [name], NP  losartan (COZAAR) 100 MG tablet Take 1 tablet by  mouth once daily 08/16/23   [name], MD  mirabegron ER (MYRBETRIQ) 50 MG TB24 tablet Take 1 tablet (50 mg total) by mouth daily. 03/15/23   [name], MD  mometasone-formoterol (DULERA) 200-5 MCG/ACT AERO Inhale 2 puffs into the lungs 2 (two) times daily. 04/22/22   [name], MD  MYRBETRIQ 50 MG TB24 tablet Take 1 tablet (50 mg total) by mouth daily. 10/12/23   [name], MD  omega-3 acid ethyl esters (LOVAZA) 1 g capsule Take 2 capsules (2 g total) by mouth 2 (two) times daily. 05/13/23   [name], [name], MD  rosuvastatin (CRESTOR) 40 MG tablet Take 1 tablet (40 mg total) by mouth at bedtime. 04/01/23 10/04/23  [name], [name], MD  Semaglutide,0.25 or 0.5MG /DOS, (OZEMPIC, 0.25 OR 0.5 MG/DOSE,) 2 MG/3ML SOPN INJECT 0.25 MG SUBCUTANEOUSLY ONCE A WEEK FOR 2 WEEKS, THEN INCREASE TO 0.5 MG WEEKLY THEREAFTER 04/26/23   [name], NP  spironolactone (ALDACTONE) 25 MG tablet Take 1 tablet (25 mg total) by mouth daily. 07/05/23   [name], MD  traZODone (DESYREL) 100 MG tablet Take 1 tablet (100 mg total) by mouth at bedtime. 07/11/23   [name], MD      Allergies    Janumet [sitagliptin-metformin hcl] and Robaxin [methocarbamol]    Review of Systems   Review of Systems  Gastrointestinal:  Positive for abdominal pain.    Physical Exam Updated Vital Signs  BP (!) 103/57   Pulse 80   Temp 97.9 F (36.6 C) (Oral)   Resp 18   Ht 5\' 3"  (1.6 m)   Wt 110.7 kg   SpO2 97%   BMI 43.22 kg/m  Physical Exam Vitals and nursing note reviewed.  Constitutional:      Appearance: She is well-developed.  Cardiovascular:     Rate and Rhythm: Normal rate.  Abdominal:     General: There is no distension.     Palpations: Abdomen is soft.     Tenderness: There is generalized abdominal tenderness. There is no right CVA tenderness or left CVA tenderness.  Skin:    General: Skin is warm and dry.  Neurological:     Mental Status: She is  alert and oriented to person, place, and time.     ED Results / Procedures / Treatments   Labs (all labs ordered are listed, but only abnormal results are displayed) Labs Reviewed  COMPREHENSIVE METABOLIC PANEL - Abnormal; Notable for the following components:      Result Value   Glucose, Bld 120 (*)    Creatinine, Ser 1.41 (*)    Total Protein 8.6 (*)    GFR, Estimated 42 (*)    All other components within normal limits  CBC - Abnormal; Notable for the following components:   WBC 11.2 (*)    All other components within normal limits  URINALYSIS, ROUTINE W REFLEX MICROSCOPIC - Abnormal; Notable for the following components:   APPearance HAZY (*)    Protein, ur 30 (*)    All other components within normal limits  RESP PANEL BY RT-PCR (RSV, FLU A&B, COVID)  [id number]  LIPASE, BLOOD   Results for orders placed or performed during the hospital encounter of 11/07/23  Resp panel by RT-PCR (RSV, Flu A&B, Covid) Anterior Nasal Swab   Specimen: Anterior Nasal Swab  Result Value Ref Range   SARS Coronavirus 2 by RT PCR NEGATIVE NEGATIVE   Influenza A by PCR NEGATIVE NEGATIVE   Influenza B by PCR NEGATIVE NEGATIVE   Resp Syncytial Virus by PCR NEGATIVE NEGATIVE  Lipase, blood  Result Value Ref Range   Lipase 44 11 - 51 U/L  Comprehensive metabolic panel  Result Value Ref Range   Sodium 136 135 - 145 mmol/L   Potassium 3.8 3.5 - 5.1 mmol/L   Chloride 101 98 - 111 mmol/L   CO2 24 22 - 32 mmol/L   Glucose, Bld 120 (H) 70 - 99 mg/dL   BUN 16 8 - 23 mg/dL   Creatinine, Ser 2.95 (H) 0.44 - 1.00 mg/dL   Calcium 9.2 8.9 - 62.1 mg/dL   Total Protein 8.6 (H) 6.5 - 8.1 g/dL   Albumin 4.5 3.5 - 5.0 g/dL   AST 26 15 - 41 U/L   ALT 25 0 - 44 U/L   Alkaline Phosphatase 67 38 - 126 U/L   Total Bilirubin 0.6 <1.2 mg/dL   GFR, Estimated 42 (L) >60 mL/min   Anion gap 11 5 - 15  CBC  Result Value Ref Range   WBC 11.2 (H) 4.0 - 10.5 K/uL   RBC 4.97 3.87 - 5.11 MIL/uL   Hemoglobin 13.3 12.0 -  15.0 g/dL   HCT 30.8 65.7 - 84.6 %   MCV 87.1 80.0 - 100.0 fL   MCH 26.8 26.0 - 34.0 pg   MCHC 30.7 30.0 - 36.0 g/dL   RDW 96.2 95.2 - 84.1 %   Platelets 151 150 -  400 K/uL   nRBC 0.0 0.0 - 0.2 %  Urinalysis, Routine w reflex microscopic -Urine, Clean Catch  Result Value Ref Range   Color, Urine YELLOW YELLOW   APPearance HAZY (A) CLEAR   Specific Gravity, Urine 1.012 1.005 - 1.030   pH 5.0 5.0 - 8.0   Glucose, UA NEGATIVE NEGATIVE mg/dL   Hgb urine dipstick NEGATIVE NEGATIVE   Bilirubin Urine NEGATIVE NEGATIVE   Ketones, ur NEGATIVE NEGATIVE mg/dL   Protein, ur 30 (A) NEGATIVE mg/dL   Nitrite NEGATIVE NEGATIVE   Leukocytes,Ua NEGATIVE NEGATIVE   RBC / HPF 0-5 0 - 5 RBC/hpf   WBC, UA 6-10 0 - 5 WBC/hpf   Bacteria, UA NONE SEEN NONE SEEN   Squamous Epithelial / HPF 6-10 0 - 5 /HPF   Mucus PRESENT    Hyaline Casts, UA PRESENT     EKG None  Radiology CT ABDOMEN PELVIS W CONTRAST  Result Date: 11/07/2023 CLINICAL DATA:  Abdominal pain EXAM: CT ABDOMEN AND PELVIS WITH CONTRAST TECHNIQUE: Multidetector CT imaging of the abdomen and pelvis was performed using the standard protocol following bolus administration of intravenous contrast. RADIATION DOSE REDUCTION: This exam was performed according to the departmental dose-optimization program which includes automated exposure control, adjustment of the mA and/or kV according to patient size and/or use of iterative reconstruction technique. CONTRAST:  80mL OMNIPAQUE IOHEXOL 300 MG/ML  SOLN COMPARISON:  None Available. FINDINGS: Lower chest: No acute abnormality Hepatobiliary: No focal hepatic abnormality. Gallbladder unremarkable. Pancreas: No focal abnormality or ductal dilatation. Spleen: No focal abnormality.  Normal size. Adrenals/Urinary Tract: No adrenal abnormality. No focal renal abnormality. No stones or hydronephrosis. Urinary bladder is unremarkable. Stomach/Bowel: Normal appendix. Mild wall thickening within the ascending colon  today hepatic flexure suggesting colitis. Stomach and small bowel decompressed. No bowel obstruction. Vascular/Lymphatic: No evidence of aneurysm or adenopathy. Reproductive: Prior hysterectomy.  No adnexal masses. Other: No free fluid or free air. Musculoskeletal: No acute bony abnormality. IMPRESSION: Mild apparent wall thickening within the right colon suggesting colitis. Recommend clinical correlation. Electronically Signed   By: Charlett Nose M.D.   On: 11/07/2023 20:13    Procedures Procedures    Medications Ordered in ED Medications  sodium chloride 0.9 % bolus 1,000 mL (0 mLs Intravenous Stopped 11/07/23 2057)  iohexol (OMNIPAQUE) 300 MG/ML solution 80 mL (80 mLs Intravenous Contrast Given 11/07/23 1929)  sodium chloride 0.9 % bolus 1,000 mL (0 mLs Intravenous Stopped 11/07/23 2245)  ciprofloxacin (CIPRO) tablet 500 mg (500 mg Oral Given 11/07/23 2213)  metroNIDAZOLE (FLAGYL) tablet 500 mg (500 mg Oral Given 11/07/23 2214)    ED Course/ Medical Decision Making/ A&P                                 Medical Decision Making This patient presents to the ED for concern of abdominal pain, this involves an extensive number of treatment options, and is a complaint that carries with it a high risk of complications and morbidity.  The differential diagnosis includes gastroenteritis, SBO/LBO, ischemic bowel,    Co morbidities that complicate the patient evaluation  T2DM, HTN, HLD,    Additional history obtained:  Additional history and/or information obtained from chart review, notable for No recent hospital encounters   Lab Tests:  I Ordered, and personally interpreted labs.  The pertinent results include:  WBC 11.2; Cr 1.41 (GFR 42); urine negative    Imaging Studies ordered:  I  ordered imaging studies including CT abd/pel showed findings c/w colitis per Radiologist interpretation    Test Considered:  GI panel including C. Diff - patient unable to provide sample  Problem List  / ED Course:  Patient with abdominal pain and diarrhea for the past 4 days No fever, VSS Labs reassuring. Slightly elevated Cr at 1.4, felt likely to be dehydration.  CT evidence colitis requiring supportive care.   Review of patient's blood pressure: remains hypotensive ranging from 91/50 to 113/67 - chart reviewed. Multiple charts showing normal BP 140-144 systolic. This with the mild AKI feel the patient would benefit from admission for continuation of hydration and monitoring of BP and renal function. Cipro and Flagyl started in ED. Patient and husband updated and agree to plan of care.    Social Determinants of Health:  Good family support   Disposition:  After consideration of the diagnostic results and the patients response to treatment, I feel that the patient would benefit from admission as planned above. Dx: colitis; hypotension; AKI  Discussed with Dr. Margo Aye who accepts the patient for admission.    Amount and/or Complexity of Data Reviewed Labs: ordered. Radiology: ordered.  Risk Prescription drug management.           Final Clinical Impression(s) / ED Diagnoses Final diagnoses:  Colitis  Generalized abdominal pain  Hypotension, unspecified hypotension type  AKI (acute kidney injury) [facility])    Rx / DC Orders ED Discharge Orders     None         Danne Harbor 11/07/23 2252    Gloris Manchester, MD 11/07/23 2321

## 2023-11-07 NOTE — ED Triage Notes (Signed)
Pt with diarrhea since last Thursday and abd pain. ? Dehydration. C/o fatigue and generalized weakness. Denies any blood in stools, denies any emesis.

## 2023-11-07 NOTE — ED Notes (Signed)
Introduced self to pt and fixed BP cuff Vitals listed Pt Stated that pain is 8/10 over the entire ABD Diarrhea since Thursday  Denies nausea and vomiting IV access establish and fluids started  Waiting on further orders Informed PA that pt requested pain medication

## 2023-11-07 NOTE — ED Notes (Signed)
Rounding Pt sleeping Pt stated that her pain is better 5/10

## 2023-11-07 NOTE — ED Notes (Signed)
Gave gingerale  

## 2023-11-07 NOTE — Patient Instructions (Signed)

## 2023-11-07 NOTE — ED Notes (Signed)
Approx 300cc left on bolus

## 2023-11-07 NOTE — Telephone Encounter (Signed)
Pt walked into the office to be seen. Pt's triage consisted of severe abd pain since last Thursday (pain level about 8), diarrhea everyday (from 6 to more). Weakness and fatigue. Dr was consulted and the pt was advised to go to the ED for further evaluation. The pt and her husband expressed understanding

## 2023-11-07 NOTE — ED Provider Triage Note (Signed)
Emergency Medicine Provider Triage Evaluation Note  Leslie Lyons , a 62 y.o. female  was evaluated in triage.  Pt complains of Abd pain.  Review of Systems  Positive: Epigastric pain, diarrhea Negative: Vomiting fever  Physical Exam  There were no vitals taken for this visit. Gen:   Awake, no distress   Resp:  Normal effort  MSK:   Moves extremities without difficulty  Other:  Epigastric tenderness. Nondistended  Medical Decision Making  Medically screening exam initiated at 3:53 PM.  Appropriate orders placed.  Leslie Lyons was informed that the remainder of the evaluation will be completed by another provider, this initial triage assessment does not replace that evaluation, and the importance of remaining in the ED until their evaluation is complete.  4-5 days of epigastric pain extending to lower abdomen along midline. NO vomiting. +diarrhea without any blood. No fever. No sick contacts.   Elpidio Anis, PA-C 11/07/23 1556

## 2023-11-08 ENCOUNTER — Encounter (INDEPENDENT_AMBULATORY_CARE_PROVIDER_SITE_OTHER): Payer: Medicare Other | Admitting: Family Medicine

## 2023-11-08 ENCOUNTER — Encounter (HOSPITAL_COMMUNITY): Payer: Self-pay | Admitting: Internal Medicine

## 2023-11-08 DIAGNOSIS — K529 Noninfective gastroenteritis and colitis, unspecified: Secondary | ICD-10-CM | POA: Diagnosis not present

## 2023-11-08 LAB — MAGNESIUM: Magnesium: 1.9 mg/dL (ref 1.7–2.4)

## 2023-11-08 LAB — HIV ANTIBODY (ROUTINE TESTING W REFLEX): HIV Screen 4th Generation wRfx: NONREACTIVE

## 2023-11-08 LAB — SEDIMENTATION RATE: Sed Rate: 18 mm/h (ref 0–22)

## 2023-11-08 LAB — C DIFFICILE QUICK SCREEN W PCR REFLEX
C Diff antigen: NEGATIVE
C Diff interpretation: NOT DETECTED
C Diff toxin: NEGATIVE

## 2023-11-08 LAB — CBC
HCT: 36.6 % (ref 36.0–46.0)
Hemoglobin: 11.5 g/dL — ABNORMAL LOW (ref 12.0–15.0)
MCH: 27.3 pg (ref 26.0–34.0)
MCHC: 31.4 g/dL (ref 30.0–36.0)
MCV: 86.7 fL (ref 80.0–100.0)
Platelets: 200 10*3/uL (ref 150–400)
RBC: 4.22 MIL/uL (ref 3.87–5.11)
RDW: 13.9 % (ref 11.5–15.5)
WBC: 9.3 10*3/uL (ref 4.0–10.5)
nRBC: 0 % (ref 0.0–0.2)

## 2023-11-08 LAB — C-REACTIVE PROTEIN: CRP: 1.1 mg/dL — ABNORMAL HIGH (ref <1.0)

## 2023-11-08 LAB — COMPREHENSIVE METABOLIC PANEL
ALT: 21 U/L (ref 0–44)
AST: 21 U/L (ref 15–41)
Albumin: 3.7 g/dL (ref 3.5–5.0)
Alkaline Phosphatase: 55 U/L (ref 38–126)
Anion gap: 6 (ref 5–15)
BUN: 14 mg/dL (ref 8–23)
CO2: 27 mmol/L (ref 22–32)
Calcium: 8.7 mg/dL — ABNORMAL LOW (ref 8.9–10.3)
Chloride: 104 mmol/L (ref 98–111)
Creatinine, Ser: 1.14 mg/dL — ABNORMAL HIGH (ref 0.44–1.00)
GFR, Estimated: 54 mL/min — ABNORMAL LOW (ref >60)
Glucose, Bld: 115 mg/dL — ABNORMAL HIGH (ref 70–99)
Potassium: 4.3 mmol/L (ref 3.5–5.1)
Sodium: 137 mmol/L (ref 135–145)
Total Bilirubin: 0.5 mg/dL (ref <1.2)
Total Protein: 7.3 g/dL (ref 6.5–8.1)

## 2023-11-08 LAB — GLUCOSE, CAPILLARY
Glucose-Capillary: 97 mg/dL (ref 70–99)
Glucose-Capillary: 144 mg/dL — ABNORMAL HIGH (ref 70–99)
Glucose-Capillary: 105 mg/dL — ABNORMAL HIGH (ref 70–99)
Glucose-Capillary: 134 mg/dL — ABNORMAL HIGH (ref 70–99)

## 2023-11-08 LAB — PHOSPHORUS: Phosphorus: 2.7 mg/dL (ref 2.5–4.6)

## 2023-11-08 MED ORDER — SIMETHICONE 40 MG/0.6ML PO SUSP: 40.0000 mg | Freq: Four times a day (QID) | ORAL | Status: DC | PRN

## 2023-11-08 MED ORDER — ENOXAPARIN SODIUM 40 MG/0.4ML IJ SOSY
40.0000 mg | PREFILLED_SYRINGE | INTRAMUSCULAR | Status: DC
  Administered 2023-11-08 – 2023-11-09 (×2): 40 mg via SUBCUTANEOUS
  Filled 2023-11-08 (×2): qty 0.4

## 2023-11-08 MED ORDER — ENSURE ENLIVE PO LIQD
237.0000 mL | Freq: Two times a day (BID) | ORAL | Status: DC
  Administered 2023-11-08 – 2023-11-09 (×2): 237 mL via ORAL

## 2023-11-08 MED ORDER — INSULIN ASPART 100 UNIT/ML IJ SOLN: 0.0000 [IU] | Freq: Every day | INTRAMUSCULAR | Status: DC

## 2023-11-08 MED ORDER — INSULIN ASPART 100 UNIT/ML IJ SOLN
0.0000 [IU] | Freq: Three times a day (TID) | INTRAMUSCULAR | Status: DC
  Administered 2023-11-08: 1 [IU] via SUBCUTANEOUS
  Administered 2023-11-09: 2 [IU] via SUBCUTANEOUS

## 2023-11-08 MED ORDER — PROCHLORPERAZINE EDISYLATE 10 MG/2ML IJ SOLN: 5.0000 mg | Freq: Four times a day (QID) | INTRAMUSCULAR | Status: DC | PRN

## 2023-11-08 MED ORDER — SODIUM CHLORIDE 0.9 % IV SOLN: INTRAVENOUS | Status: AC

## 2023-11-08 MED ORDER — MELATONIN 3 MG PO TABS: 6.0000 mg | ORAL_TABLET | Freq: Every evening | ORAL | Status: DC | PRN

## 2023-11-08 MED ORDER — ACETAMINOPHEN 325 MG PO TABS
650.0000 mg | ORAL_TABLET | Freq: Four times a day (QID) | ORAL | Status: DC | PRN
  Administered 2023-11-08 – 2023-11-09 (×2): 650 mg via ORAL
  Filled 2023-11-08 (×2): qty 2

## 2023-11-08 MED ORDER — SIMETHICONE 80 MG PO CHEW: 40.0000 mg | CHEWABLE_TABLET | Freq: Four times a day (QID) | ORAL | Status: DC | PRN

## 2023-11-08 MED ORDER — ROSUVASTATIN CALCIUM 20 MG PO TABS
40.0000 mg | ORAL_TABLET | Freq: Every day | ORAL | Status: DC
  Administered 2023-11-08: 40 mg via ORAL
  Filled 2023-11-08: qty 2

## 2023-11-08 NOTE — Hospital Course (Signed)
62 yo F with morbid obesity, DM, HTN, HLD presented with diarrhea and abdominal cramps.  CT showed focal right colitis only.

## 2023-11-08 NOTE — Progress Notes (Signed)
NO SHOW

## 2023-11-08 NOTE — Progress Notes (Signed)
    Patient: Leslie Lyons ZOX:[id number] DOB: [birth year]      Brief hospital course: 62 yo F with morbid obesity, DM, HTN, HLD presented with diarrhea and abdominal cramps.  CT showed focal right colitis only.    This is a no charge note, for further details, please see the H&P by my partner, Dr. Margo Aye from earlier today.      Colitis Cdiff ruled out.  No hematochezia, doubt Ecoli or salmonella.  GI pathogen panel still pending though. - Hold antibiotics until E coli ruled out - Hold Imodium until panel returns - Antiemetics or simethicone PRN   DM - Continue SS corrections  HLD - Continue Crestor  Morbid obesity BMI 43  Hypertension - Hold losartan, spironolactone given dehydration       Physical Exam: BP 110/74 (BP Location: Left Arm)   Pulse 82   Temp 98.8 F (37.1 C) (Oral)   Resp 19   Ht 5\' 3"  (1.6 m)   Wt 110.7 kg   SpO2 97%   BMI 43.23 kg/m   Patient seen and examined.      Family Communication: None present        Author: Alberteen Sam, MD 11/08/2023 6:53 PM

## 2023-11-08 NOTE — H&P (Addendum)
History and Physical  [name] XBJ:[id number] DOB: 11/19/1961 DOA: 11/07/2023  Referring physician: [name], PA-EDP  PCP: [name], MD  Outpatient Specialists: None Patient coming from: Home  Chief Complaint: Diarrhea   HPI: Leslie Lyons is a 62 y.o. female with medical history significant for type 2 diabetes, hypertension, hyperlipidemia, obesity, who presented with 5 days of diarrhea, watery stools 5-6 times per day.  Associated with abdominal cramping and intermittent headaches.  Denies any vomiting.  No subjective fevers or chills.  No recent use of antibiotics in the past 3 months.  Presented to the ED for further evaluation.  In the ED, BPs are soft, mildly elevated WBCs 11.2 K, elevated creatinine above normal baseline.  Watery stools persist in the ED.  Contrast enhanced CT abdomen and pelvis revealed mild apparent wall thickening within the right colon suggesting colitis.  EDP requesting admission for further management.  Admitted by [facility], hospitalist service.  ED Course: Temperature 97.9.  BP 90/50, pulse 87, respiratory 18, O2 saturation 96% on room air.  Review of Systems: Review of systems as noted in the HPI. All other systems reviewed and are negative.   Past Medical History:  Diagnosis Date   Abnormal transaminases    Anxiety    Bladder incontinence    Bronchial asthma    with acute exacerbation   Cancer (HCC) 1980   cervical, diag  at age 67   Depression    Diabetes (HCC)    Diabetes mellitus, type II (HCC)    Headache(784.0)    with reduced vision in right eye for one month    Hyperlipidemia    Hypertension    Obesity    Past Surgical History:  Procedure Laterality Date   ABDOMINAL HYSTERECTOMY     COLONOSCOPY WITH ESOPHAGOGASTRODUODENOSCOPY (EGD)  11/2008   Dr. Lovell Sheehan: Conscious sedation.  Hiatal hernia.  Hemorrhoids. PATIENT REPORTS BEING AWAKE DURING PROCEDURE   COLONOSCOPY WITH PROPOFOL N/A 10/08/2022   Procedure:  COLONOSCOPY WITH PROPOFOL;  Surgeon: Lanelle Bal, DO;  Location: [facility];  Service: Endoscopy;  Laterality: N/A;  7:30 AM   CRYOTHERAPY     for abnormal pap   HEMORRHOID SURGERY     dr. Elpidio Anis   PARTIAL HYSTERECTOMY     POLYPECTOMY  10/08/2022   Procedure: POLYPECTOMY;  Surgeon: Lanelle Bal, DO;  Location: [facility];  Service: Endoscopy;;    Social History:  reports that she quit smoking about 7 years ago. Her smoking use included cigarettes. She started smoking about 9 years ago. She has a 0.5 pack-year smoking history. She has never used smokeless tobacco. She reports that she does not drink alcohol and does not use drugs.   Allergies  Allergen Reactions   Janumet [Sitagliptin-Metformin Hcl]     States she vomits up the whole pill after taking this   Robaxin [Methocarbamol] Swelling    Family History  Problem Relation Age of Onset   Cancer Mother        lung    COPD Father        emphysema   Hypertension Father    Cancer Maternal Aunt        breast   Cancer Paternal Uncle    Colon cancer Neg Hx       Prior to Admission medications   Medication Sig Start Date End Date Taking? Authorizing Provider  albuterol (VENTOLIN HFA) 108 (90 Base) MCG/ACT inhaler INHALE 2 PUFFS BY MOUTH EVERY 6  HOURS AS NEEDED 12/29/22   [name], MD  blood glucose meter kit and supplies Dispense based on patient and insurance preference. Test blood sugar once daily before breakfast. (FOR ICD-10 E10.9, E11.9). 10/30/20   [name], MD  Blood Glucose Monitoring Suppl (ONETOUCH VERIO FLEX SYSTEM) w/Device KIT Use to monitor glucose twice daily 04/23/22   [name], NP  cholecalciferol (VITAMIN D3) 25 MCG (1000 UNIT) tablet Take 1,000 Units by mouth daily.    [provider]  glucose blood (ONETOUCH VERIO) test strip USE 1 STRIP TO CHECK GLUCOSE TWICE DAILY 09/21/23   [name], NP  insulin degludec (TRESIBA FLEXTOUCH) 100 UNIT/ML  FlexTouch Pen Inject 30 Units into the skin at bedtime. 05/04/23   [name], NP  Insulin Pen Needle (BD PEN NEEDLE NANO 2ND GEN) 32G X 4 MM MISC USE TO INJECT INSULIN ONCE DAILY 11/01/23   [name], NP  Lancets [facility] ULTRASOFT) lancets Use as instructed to monitor glucose twice daily 04/23/22   [name], NP  losartan (COZAAR) 100 MG tablet Take 1 tablet by mouth once daily 08/16/23   [name], MD  mirabegron ER (MYRBETRIQ) 50 MG TB24 tablet Take 1 tablet (50 mg total) by mouth daily. 03/15/23   [name], MD  mometasone-formoterol (DULERA) 200-5 MCG/ACT AERO Inhale 2 puffs into the lungs 2 (two) times daily. 04/22/22   [name], MD  MYRBETRIQ 50 MG TB24 tablet Take 1 tablet (50 mg total) by mouth daily. 10/12/23   [name], MD  omega-3 acid ethyl esters (LOVAZA) 1 g capsule Take 2 capsules (2 g total) by mouth 2 (two) times daily. 05/13/23   [name], [name], MD  rosuvastatin (CRESTOR) 40 MG tablet Take 1 tablet (40 mg total) by mouth at bedtime. 04/01/23 10/04/23  [name], [name], MD  Semaglutide,0.25 or 0.5MG /DOS, (OZEMPIC, 0.25 OR 0.5 MG/DOSE,) 2 MG/3ML SOPN INJECT 0.25 MG SUBCUTANEOUSLY ONCE A WEEK FOR 2 WEEKS, THEN INCREASE TO 0.5 MG WEEKLY THEREAFTER 04/26/23   [name], NP  spironolactone (ALDACTONE) 25 MG tablet Take 1 tablet (25 mg total) by mouth daily. 07/05/23   [name], MD  traZODone (DESYREL) 100 MG tablet Take 1 tablet (100 mg total) by mouth at bedtime. 07/11/23   [name], MD    Physical Exam: BP (!) 126/37   Pulse 91   Temp 97.9 F (36.6 C) (Oral)   Resp (!) 22   Ht 5\' 3"  (1.6 m)   Wt 110.7 kg   SpO2 96%   BMI 43.22 kg/m   General: 62 y.o. year-old female well developed well nourished in no acute distress.  Alert and oriented x3. Cardiovascular: Regular rate and rhythm with no rubs or gallops.  No thyromegaly or JVD noted.  No lower extremity edema. 2/4 pulses in all 4  extremities. Respiratory: Clear to auscultation with no wheezes or rales. Good inspiratory effort. Abdomen: Soft diffuse tenderness nondistended with normal bowel sounds x4 quadrants. Muskuloskeletal: No cyanosis, clubbing or edema noted bilaterally Neuro: CN II-XII intact, strength, sensation, reflexes Skin: No ulcerative lesions noted or rashes Psychiatry: Judgement and insight appear normal. Mood is appropriate for condition and setting          Labs on Admission:  Basic Metabolic Panel: Recent Labs  Lab 11/07/23 1649  NA 136  K 3.8  CL 101  CO2 24  GLUCOSE 120*  BUN 16  CREATININE 1.41*  CALCIUM  9.2   Liver Function Tests: Recent Labs  Lab 11/07/23 1649  AST 26  ALT 25  ALKPHOS 67  BILITOT 0.6  PROT 8.6*  ALBUMIN 4.5   Recent Labs  Lab 11/07/23 1649  LIPASE 44   No results for input(s): "AMMONIA" in the last 168 hours. CBC: Recent Labs  Lab 11/07/23 1649  WBC 11.2*  HGB 13.3  HCT 43.3  MCV 87.1  PLT 151   Cardiac Enzymes: No results for input(s): "CKTOTAL", "CKMB", "CKMBINDEX", "TROPONINI" in the last 168 hours.  BNP (last 3 results) No results for input(s): "BNP" in the last 8760 hours.  ProBNP (last 3 results) No results for input(s): "PROBNP" in the last 8760 hours.  CBG: No results for input(s): "GLUCAP" in the last 168 hours.  Radiological Exams on Admission: CT ABDOMEN PELVIS W CONTRAST  Result Date: 11/07/2023 CLINICAL DATA:  Abdominal pain EXAM: CT ABDOMEN AND PELVIS WITH CONTRAST TECHNIQUE: Multidetector CT imaging of the abdomen and pelvis was performed using the standard protocol following bolus administration of intravenous contrast. RADIATION DOSE REDUCTION: This exam was performed according to the departmental dose-optimization program which includes automated exposure control, adjustment of the mA and/or kV according to patient size and/or use of iterative reconstruction technique. CONTRAST:  80mL OMNIPAQUE IOHEXOL 300 MG/ML  SOLN  COMPARISON:  None Available. FINDINGS: Lower chest: No acute abnormality Hepatobiliary: No focal hepatic abnormality. Gallbladder unremarkable. Pancreas: No focal abnormality or ductal dilatation. Spleen: No focal abnormality.  Normal size. Adrenals/Urinary Tract: No adrenal abnormality. No focal renal abnormality. No stones or hydronephrosis. Urinary bladder is unremarkable. Stomach/Bowel: Normal appendix. Mild wall thickening within the ascending colon today hepatic flexure suggesting colitis. Stomach and small bowel decompressed. No bowel obstruction. Vascular/Lymphatic: No evidence of aneurysm or adenopathy. Reproductive: Prior hysterectomy.  No adnexal masses. Other: No free fluid or free air. Musculoskeletal: No acute bony abnormality. IMPRESSION: Mild apparent wall thickening within the right colon suggesting colitis. Recommend clinical correlation. Electronically Signed   By: Charlett Nose M.D.   On: 11/07/2023 20:13    EKG: I independently viewed the EKG done and my findings are as followed: None available at the time of this visit.  Assessment/Plan Present on Admission:  Colitis  Principal Problem:   Colitis  Colitis, unclear etiology Persistent diarrhea, unclear etiology Rule out infectious etiology Obtain stool study, GI panel by PCR, C. difficile by PCR Sed rate, CRP Consult GI if no evidence of an infection IV fluid hydration  AKI, prerenal, secondary to dehydration from diarrhea Normal baseline creatinine Presented with creatinine 1.41 with GFR of 42 Avoid nephrotoxic agents, dehydration and hypotension Monitor urine output Repeat renal function panel in the morning  Type 2 diabetes with hyperglycemia Last hemoglobin A1c 7.5 on 09/07/2023 Start insulin sliding scale  Hypertension, BPs are currently soft Hold off home oral antihypertensives.  Continue to closely monitor vital signs.  Hyperlipidemia Resume home Crestor  Severe obesity BMI 43 Recommend weight loss  outpatient with regular physical activity and healthy dieting.   Time: 75 minutes.   DVT prophylaxis: Subcu Lovenox daily.  Code Status: Full code.  Family Communication: None at bedside.  Disposition Plan: Admitted to telemetry unit.  Consults called: None.  Admission status: Observation status.   Status is: Observation    Darlin Drop MD [facility] Pager [phone]  If 7PM-7AM, please contact night-coverage [email or web address] Password [email or web address]  11/08/2023, 5:11 AM

## 2023-11-08 NOTE — Progress Notes (Signed)
   11/08/23 0938  TOC Brief Assessment  Insurance and Status Reviewed  Patient has primary care physician Yes  Home environment has been reviewed from home  Prior level of function: independent  Prior/Current Home Services No current home services  Social Determinants of Health Reivew SDOH reviewed no interventions necessary  Readmission risk has been reviewed Yes  Transition of care needs no transition of care needs at this time    Transition of Care Department [facility]) has reviewed patient and no TOC needs have been identified at this time. We will continue to monitor patient advancement through interdisciplinary progression rounds. If new patient transition needs arise, please place a TOC consult.

## 2023-11-09 DIAGNOSIS — K529 Noninfective gastroenteritis and colitis, unspecified: Secondary | ICD-10-CM | POA: Diagnosis not present

## 2023-11-09 LAB — COMPREHENSIVE METABOLIC PANEL
ALT: 20 U/L (ref 0–44)
AST: 21 U/L (ref 15–41)
Albumin: 4 g/dL (ref 3.5–5.0)
Alkaline Phosphatase: 58 U/L (ref 38–126)
Anion gap: 7 (ref 5–15)
BUN: 13 mg/dL (ref 8–23)
CO2: 27 mmol/L (ref 22–32)
Calcium: 9 mg/dL (ref 8.9–10.3)
Chloride: 106 mmol/L (ref 98–111)
Creatinine, Ser: 0.94 mg/dL (ref 0.44–1.00)
GFR, Estimated: >60 mL/min (ref >60)
Glucose, Bld: 118 mg/dL — ABNORMAL HIGH (ref 70–99)
Potassium: 4.3 mmol/L (ref 3.5–5.1)
Sodium: 140 mmol/L (ref 135–145)
Total Bilirubin: 0.4 mg/dL (ref <1.2)
Total Protein: 7.8 g/dL (ref 6.5–8.1)

## 2023-11-09 LAB — GLUCOSE, CAPILLARY
Glucose-Capillary: 107 mg/dL — ABNORMAL HIGH (ref 70–99)
Glucose-Capillary: 154 mg/dL — ABNORMAL HIGH (ref 70–99)

## 2023-11-09 LAB — GASTROINTESTINAL PANEL BY PCR, STOOL (REPLACES STOOL CULTURE)

## 2023-11-09 LAB — CBC
HCT: 38.2 % (ref 36.0–46.0)
Hemoglobin: 11.7 g/dL — ABNORMAL LOW (ref 12.0–15.0)
MCH: 26.8 pg (ref 26.0–34.0)
MCHC: 30.6 g/dL (ref 30.0–36.0)
MCV: 87.6 fL (ref 80.0–100.0)
Platelets: 149 10*3/uL — ABNORMAL LOW (ref 150–400)
RBC: 4.36 MIL/uL (ref 3.87–5.11)
RDW: 13.7 % (ref 11.5–15.5)
WBC: 8.5 10*3/uL (ref 4.0–10.5)
nRBC: 0 % (ref 0.0–0.2)

## 2023-11-09 MED ORDER — LOPERAMIDE HCL 2 MG PO CAPS: 2.0000 mg | ORAL_CAPSULE | ORAL | Status: DC | PRN

## 2023-11-09 MED ORDER — LOPERAMIDE HCL 2 MG PO TABS: 2.0000 mg | ORAL_TABLET | Freq: Four times a day (QID) | ORAL | Status: DC | PRN

## 2023-11-09 NOTE — Discharge Summary (Signed)
Physician Discharge Summary   Patient: Leslie Lyons MRN: [id number] DOB: Sep 24, 1961  Admit date:     11/07/2023  Discharge date: 11/09/23  Discharge Physician: Alberteen Sam   PCP: [name], MD     Recommendations at discharge:  Follow up with PCP Dr. Lodema Hong in 1 week for colitis Dr. Lodema Hong: Please resume losartan and Ozempic if patient taking PO well Please obtain CBC and BMP If ongoing symptoms, please refer to GI Dr. Marletta Lor     Discharge Diagnoses: Principal Problem:   Colitis Active problems:   Type 2 diabetes with hyperlipidemia, well-controlled   Hyperlipidemia   Hypertension   Nonalcoholic steatohepatitis   Obstructive sleep apnea   Morbid obesity   Asthma      Hospital Course: 62 yo F with morbid obesity, DM, HTN, HLD presented with diarrhea and abdominal cramps.  CT showed focal right colitis only.    Colitis CT abdomen and pelvis showed short segment focal thickening of the colon, consistent with colitis.  She had no hematochezia.  C. difficile was ruled out.  A GI pathogen panel ruled out E. coli, Salmonella, and another serious bacterial infections.  Her symptoms were improving, she was tolerating p.o. diet well, and so she was discharged with Imodium, and PCP follow-up.  Suspect this is resolving infection, less likely ischemic colitis, although both should be treated supportively with hydration and Imodium.  Follow-up with PCP and GI.             The [facility] Controlled Substances Registry was reviewed for this patient prior to discharge.   Consultants: None Procedures performed:  CT abdomen  Disposition: Home   DISCHARGE MEDICATION: Allergies as of 11/09/2023       Reactions   Janumet [sitagliptin-metformin Hcl] Nausea And Vomiting   States she vomits up the whole pill after taking this   Robaxin [methocarbamol] Swelling        Medication List     TAKE these medications    albuterol 108 (90  Base) MCG/ACT inhaler Commonly known as: VENTOLIN HFA INHALE 2 PUFFS BY MOUTH EVERY 6 HOURS AS NEEDED   BD Pen Needle Nano 2nd Gen 32G X 4 MM Misc Generic drug: Insulin Pen Needle USE TO INJECT INSULIN ONCE DAILY   blood glucose meter kit and supplies Dispense based on patient and insurance preference. Test blood sugar once daily before breakfast. (FOR ICD-10 E10.9, E11.9).   cholecalciferol 25 MCG (1000 UNIT) tablet Commonly known as: VITAMIN D3 Take 1,000 Units by mouth daily.   Dulera 200-5 MCG/ACT Aero Generic drug: mometasone-formoterol Inhale 2 puffs into the lungs 2 (two) times daily.   loperamide 2 MG tablet Commonly known as: Imodium A-D Take 1 tablet (2 mg total) by mouth 4 (four) times daily as needed for diarrhea or loose stools.   losartan 100 MG tablet Commonly known as: COZAAR Take 1 tablet by mouth once daily   mirabegron ER 50 MG Tb24 tablet Commonly known as: MYRBETRIQ Take 1 tablet (50 mg total) by mouth daily.   Myrbetriq 50 MG Tb24 tablet Generic drug: mirabegron ER Take 1 tablet (50 mg total) by mouth daily.   omega-3 acid ethyl esters 1 g capsule Commonly known as: LOVAZA Take 2 capsules (2 g total) by mouth 2 (two) times daily.   onetouch ultrasoft lancets Use as instructed to monitor glucose twice daily   OneTouch Verio Flex System w/Device Kit Use to monitor glucose twice daily   OneTouch Verio test strip  Generic drug: glucose blood USE 1 STRIP TO CHECK GLUCOSE TWICE DAILY   OZEMPIC (1 MG/DOSE)  Inject 1 mg into the skin once a week.   rosuvastatin 40 MG tablet Commonly known as: CRESTOR Take 1 tablet (40 mg total) by mouth at bedtime.   spironolactone 25 MG tablet Commonly known as: ALDACTONE Take 1 tablet (25 mg total) by mouth daily.   traZODone 100 MG tablet Commonly known as: DESYREL Take 1 tablet (100 mg total) by mouth at bedtime.   Evaristo Bury FlexTouch 100 UNIT/ML FlexTouch Pen Generic drug: insulin degludec Inject 30  Units into the skin at bedtime.         Discharge Instructions     Discharge instructions   Complete by: As directed    **IMPORTANT DISCHARGE INSTRUCTIONS**   From Dr. Maryfrances Bunnell: You were admitted for colitis. Here, you had a CT scan that showed a small area of colitis in the right colon. This is usually from resolving infection (in other words, from something you ate) Your stool testing here proved that you did not have E coli, salmonella, or Cdiff.  Another possibility is that you have mild ischemic colitis, which is caused by fatty plaque build up in the arteries of the colon.  The treatment for this is supportive only, and it gets better by itself.  Long term, the prevention is to lose weight, improve your cholesterol and manage your blood pressure  We recommend you take loperamide (also known as Imodium) to prevent diarrhea  If you have a loose stool, take an Imodium You may take up to 4 Imodium per day (it is safe to take more, but if you are taking more than 4, call your doctor)  Resume all your home medicines EXCEPT for the next week, do not take your losartan or your semaglutide/Ozempic  Go see Dr. Lodema Hong or Dr. Marletta Lor in 1 week Have them check your labs Let them know how your symptoms are doing   Ask Dr. Lodema Hong if you should restart losartan and Ozempic   Increase activity slowly   Complete by: As directed        Discharge Exam: Filed Weights   11/07/23 1553 11/08/23 0639  Weight: 110.7 kg 110.7 kg    General: Pt is alert, awake, not in acute distress Cardiovascular: RRR, nl S1-S2, no murmurs appreciated.   No LE edema.   Respiratory: Normal respiratory rate and rhythm.  CTAB without rales or wheezes. Abdominal: Abdomen soft and non-tender.  No distension or HSM.   Neuro/Psych: Strength symmetric in upper and lower extremities.  Judgment and insight appear normal.   Condition at discharge: good  The results of significant diagnostics from this  hospitalization (including imaging, microbiology, ancillary and laboratory) are listed below for reference.   Imaging Studies: CT ABDOMEN PELVIS W CONTRAST  Result Date: 11/07/2023 CLINICAL DATA:  Abdominal pain EXAM: CT ABDOMEN AND PELVIS WITH CONTRAST TECHNIQUE: Multidetector CT imaging of the abdomen and pelvis was performed using the standard protocol following bolus administration of intravenous contrast. RADIATION DOSE REDUCTION: This exam was performed according to the departmental dose-optimization program which includes automated exposure control, adjustment of the mA and/or kV according to patient size and/or use of iterative reconstruction technique. CONTRAST:  80mL OMNIPAQUE IOHEXOL 300 MG/ML  SOLN COMPARISON:  None Available. FINDINGS: Lower chest: No acute abnormality Hepatobiliary: No focal hepatic abnormality. Gallbladder unremarkable. Pancreas: No focal abnormality or ductal dilatation. Spleen: No focal abnormality.  Normal size. Adrenals/Urinary Tract: No adrenal abnormality. No  focal renal abnormality. No stones or hydronephrosis. Urinary bladder is unremarkable. Stomach/Bowel: Normal appendix. Mild wall thickening within the ascending colon today hepatic flexure suggesting colitis. Stomach and small bowel decompressed. No bowel obstruction. Vascular/Lymphatic: No evidence of aneurysm or adenopathy. Reproductive: Prior hysterectomy.  No adnexal masses. Other: No free fluid or free air. Musculoskeletal: No acute bony abnormality. IMPRESSION: Mild apparent wall thickening within the right colon suggesting colitis. Recommend clinical correlation. Electronically Signed   By: Charlett Nose M.D.   On: 11/07/2023 20:13    Microbiology: Results for orders placed or performed during the hospital encounter of 11/07/23  Resp panel by RT-PCR (RSV, Flu A&B, Covid) Anterior Nasal Swab     Status: None   Collection Time: 11/07/23  7:18 PM   Specimen: Anterior Nasal Swab  Result Value Ref Range Status    SARS Coronavirus 2 by RT PCR NEGATIVE NEGATIVE Final    Comment: (NOTE) SARS-CoV-2 target nucleic acids are NOT DETECTED.  The SARS-CoV-2 RNA is generally detectable in upper respiratory specimens during the acute phase of infection. The lowest concentration of SARS-CoV-2 viral copies this assay can detect is 138 copies/mL. A negative result does not preclude SARS-Cov-2 infection and should not be used as the sole basis for treatment or other patient management decisions. A negative result may occur with  improper specimen collection/handling, submission of specimen other than nasopharyngeal swab, presence of viral mutation(s) within the areas targeted by this assay, and inadequate number of viral copies(<138 copies/mL). A negative result must be combined with clinical observations, patient history, and epidemiological information. The expected result is Negative.  Fact Sheet for Patients:  BloggerCourse.com  Fact Sheet for Healthcare Providers:  SeriousBroker.it  This test is no t yet approved or cleared by the [place] FDA and  has been authorized for detection and/or diagnosis of SARS-CoV-2 by FDA under an Emergency Use Authorization (EUA). This EUA will remain  in effect (meaning this test can be used) for the duration of the COVID-19 declaration under Section 564(b)(1) of the Act, 21 U.S.C.section 360bbb-3(b)(1), unless the authorization is terminated  or revoked sooner.       Influenza A by PCR NEGATIVE NEGATIVE Final   Influenza B by PCR NEGATIVE NEGATIVE Final    Comment: (NOTE) The Xpert Xpress SARS-CoV-2/FLU/RSV plus assay is intended as an aid in the diagnosis of influenza from Nasopharyngeal swab specimens and should not be used as a sole basis for treatment. Nasal washings and aspirates are unacceptable for Xpert Xpress SARS-CoV-2/FLU/RSV testing.  Fact Sheet for  Patients: BloggerCourse.com  Fact Sheet for Healthcare Providers: SeriousBroker.it  This test is not yet approved or cleared by the [place] FDA and has been authorized for detection and/or diagnosis of SARS-CoV-2 by FDA under an Emergency Use Authorization (EUA). This EUA will remain in effect (meaning this test can be used) for the duration of the COVID-19 declaration under Section 564(b)(1) of the Act, 21 U.S.C. section 360bbb-3(b)(1), unless the authorization is terminated or revoked.     Resp Syncytial Virus by PCR NEGATIVE NEGATIVE Final    Comment: (NOTE) Fact Sheet for Patients: BloggerCourse.com  Fact Sheet for Healthcare Providers: SeriousBroker.it  This test is not yet approved or cleared by the [place] FDA and has been authorized for detection and/or diagnosis of SARS-CoV-2 by FDA under an Emergency Use Authorization (EUA). This EUA will remain in effect (meaning this test can be used) for the duration of the COVID-19 declaration under Section 564(b)(1) of the Act, 21  U.S.C. section 360bbb-3(b)(1), unless the authorization is terminated or revoked.  Performed at [facility], [address], [place], [place] [address]   Gastrointestinal Panel by PCR , Stool     Status: None   Collection Time: 11/08/23  1:57 AM   Specimen: Stool  Result Value Ref Range Status   Campylobacter species NOT DETECTED NOT DETECTED Final   Plesimonas shigelloides NOT DETECTED NOT DETECTED Final   Salmonella species NOT DETECTED NOT DETECTED Final   Yersinia enterocolitica NOT DETECTED NOT DETECTED Final   Vibrio species NOT DETECTED NOT DETECTED Final   Vibrio cholerae NOT DETECTED NOT DETECTED Final   Enteroaggregative E coli (EAEC) NOT DETECTED NOT DETECTED Final   Enteropathogenic E coli (EPEC) NOT DETECTED NOT DETECTED Final   Enterotoxigenic E coli (ETEC) NOT DETECTED NOT  DETECTED Final   Shiga like toxin producing E coli (STEC) NOT DETECTED NOT DETECTED Final   Shigella/Enteroinvasive E coli (EIEC) NOT DETECTED NOT DETECTED Final   Cryptosporidium NOT DETECTED NOT DETECTED Final   Cyclospora cayetanensis NOT DETECTED NOT DETECTED Final   Entamoeba histolytica NOT DETECTED NOT DETECTED Final   Giardia lamblia NOT DETECTED NOT DETECTED Final   Adenovirus F40/41 NOT DETECTED NOT DETECTED Final   Astrovirus NOT DETECTED NOT DETECTED Final   Norovirus GI/GII NOT DETECTED NOT DETECTED Final   Rotavirus A NOT DETECTED NOT DETECTED Final   Sapovirus (I, II, IV, and V) NOT DETECTED NOT DETECTED Final    Comment: Performed at [facility], [address] [place], [place] [address]  C Difficile Quick Screen w PCR reflex     Status: None   Collection Time: 11/08/23  1:57 AM   Specimen: Stool  Result Value Ref Range Status   C Diff antigen NEGATIVE NEGATIVE Final   C Diff toxin NEGATIVE NEGATIVE Final   C Diff interpretation No C. difficile detected.  Final    Comment: Performed at [facility] [address], [place], [place] [address]    Labs: CBC: Recent Labs  Lab 11/07/23 1649 11/08/23 0529 11/09/23 0417  WBC 11.2* 9.3 8.5  HGB 13.3 11.5* 11.7*  HCT 43.3 36.6 38.2  MCV 87.1 86.7 87.6  PLT 151 200 149*   Basic Metabolic Panel: Recent Labs  Lab 11/07/23 1649 11/08/23 0529 11/09/23 0417  NA 136 137 140  K 3.8 4.3 4.3  CL 101 104 106  CO2 24 27 27   GLUCOSE 120* 115* 118*  BUN 16 14 13   CREATININE 1.41* 1.14* 0.94  CALCIUM 9.2 8.7* 9.0  MG  --  1.9  --   PHOS  --  2.7  --    Liver Function Tests: Recent Labs  Lab 11/07/23 1649 11/08/23 0529 11/09/23 0417  AST 26 21 21   ALT 25 21 20   ALKPHOS 67 55 58  BILITOT 0.6 0.5 0.4  PROT 8.6* 7.3 7.8  ALBUMIN 4.5 3.7 4.0   CBG: Recent Labs  Lab 11/08/23 1131 11/08/23 1558 11/08/23 2028 11/09/23 0749 11/09/23 1113  GLUCAP 144* 105* 134* 107* 154*    Discharge time spent:  approximately 35 minutes spent on discharge counseling, evaluation of patient on day of discharge, and coordination of discharge planning with nursing, social work, pharmacy and case management  Signed: Alberteen Sam, MD [facility] 11/09/2023

## 2023-11-09 NOTE — Care Management Obs Status (Signed)
MEDICARE OBSERVATION STATUS NOTIFICATION   Patient Details  Name: Leslie Lyons MRN: [id number] Date of Birth: 11-09-1961   Medicare Observation Status Notification Given:  Yes    Corey Harold 11/09/2023, 9:40 AM

## 2023-11-15 ENCOUNTER — Encounter: Payer: Self-pay | Admitting: Gastroenterology

## 2023-11-15 ENCOUNTER — Ambulatory Visit: Payer: Medicare Other | Admitting: Gastroenterology

## 2023-11-15 VITALS — BP 136/83 | HR 112 | Temp 97.8°F | Ht 63.0 in | Wt 246.8 lb

## 2023-11-15 DIAGNOSIS — K7581 Nonalcoholic steatohepatitis (NASH): Secondary | ICD-10-CM

## 2023-11-15 DIAGNOSIS — Z860101 Personal history of adenomatous and serrated colon polyps: Secondary | ICD-10-CM | POA: Diagnosis not present

## 2023-11-15 DIAGNOSIS — K529 Noninfective gastroenteritis and colitis, unspecified: Secondary | ICD-10-CM

## 2023-11-15 NOTE — Progress Notes (Signed)
GI Office Note    Referring Provider: [name], MD Primary Care Physician:  [name], MD  Primary Gastroenterologist: Hennie Duos. Marletta Lor, DO   Chief Complaint   Chief Complaint  Patient presents with   Follow-up    Hospital follow up. Had severe diarrhea. Diagnosed with colitis.    History of Present Illness   Leslie Lyons is a 62 y.o. female presenting today for ED follow up.   ED evaluation December 9 for 4-day history of abdominal pain with diarrhea.  CT scan showing possible mild colitis.  With mild AKI and soft blood pressure she was kept overnight for hydration.  In the ED: Creatinine up to 1.41, blood cell count 11,200, hemoglobin 13.3, respiratory panel was negative, GI pathogen panel negative, C. difficile screen negative, sed rate 18, CRP 1.1.  Date of discharge her hemoglobin was down to 8.5.  CT A/P with contrast 11/07/23: IMPRESSION: Mild apparent wall thickening within the right colon suggesting colitis. Recommend clinical correlation.  Abdominal ultrasound with elastography September 2023: IMPRESSION: ULTRASOUND RUQ: Unremarkable ULTRASOUND HEPATIC ELASTOGRAPHY: Median kPa:  3.9 Diagnostic category:  < or = 5 kPa: high probability of being normal   Colonoscopy October 2023: -Hemorrhoids found on perianal exam -Nonbleeding internal hemorrhoids -Diverticulosis in the sigmoid colon -three to 7 mm polyps in the sigmoid colon, descending colon, transverse colon -5-year surveillance colonoscopy      Wt Readings from Last 3 Encounters:  09/20/23 244 lb 9.6 oz (110.9 kg)  09/07/23 244 lb (110.7 kg)  07/12/23 248 lb (112.5 kg)      Medications   Current Outpatient Medications  Medication Sig Dispense Refill   albuterol (VENTOLIN HFA) 108 (90 Base) MCG/ACT inhaler INHALE 2 PUFFS BY MOUTH EVERY 6 HOURS AS NEEDED 18 g 0   blood glucose meter kit and supplies Dispense based on patient and insurance preference. Test blood sugar  once daily before breakfast. (FOR ICD-10 E10.9, E11.9). 1 each 0   Blood Glucose Monitoring Suppl (ONETOUCH VERIO FLEX SYSTEM) w/Device KIT Use to monitor glucose twice daily 1 kit 0   cholecalciferol (VITAMIN D3) 25 MCG (1000 UNIT) tablet Take 1,000 Units by mouth daily.     glucose blood (ONETOUCH VERIO) test strip USE 1 STRIP TO CHECK GLUCOSE TWICE DAILY 100 each 0   insulin degludec (TRESIBA FLEXTOUCH) 100 UNIT/ML FlexTouch Pen Inject 30 Units into the skin at bedtime. 21 mL 3   Insulin Pen Needle (BD PEN NEEDLE NANO 2ND GEN) 32G X 4 MM MISC USE TO INJECT INSULIN ONCE DAILY 100 each 3   Lancets (ONETOUCH ULTRASOFT) lancets Use as instructed to monitor glucose twice daily 100 each 12   loperamide (IMODIUM A-D) 2 MG tablet Take 1 tablet (2 mg total) by mouth 4 (four) times daily as needed for diarrhea or loose stools.     losartan (COZAAR) 100 MG tablet Take 1 tablet by mouth once daily 90 tablet 0   mirabegron ER (MYRBETRIQ) 50 MG TB24 tablet Take 1 tablet (50 mg total) by mouth daily. 30 tablet 5   mometasone-formoterol (DULERA) 200-5 MCG/ACT AERO Inhale 2 puffs into the lungs 2 (two) times daily. 13 g 2   MYRBETRIQ 50 MG TB24 tablet Take 1 tablet (50 mg total) by mouth daily. 30 tablet 5   omega-3 acid ethyl esters (LOVAZA) 1 g capsule Take 2 capsules (2 g total) by mouth 2 (two) times daily. 60 capsule 11   rosuvastatin (CRESTOR) 40 MG tablet  Take 1 tablet (40 mg total) by mouth at bedtime. 90 tablet 3   Semaglutide (OZEMPIC, 1 MG/DOSE, [place]) Inject 1 mg into the skin once a week.     spironolactone (ALDACTONE) 25 MG tablet Take 1 tablet (25 mg total) by mouth daily. 30 tablet 4   traZODone (DESYREL) 100 MG tablet Take 1 tablet (100 mg total) by mouth at bedtime. 30 tablet 3   No current facility-administered medications for this visit.    Allergies   Allergies as of 11/15/2023 - Review Complete 11/15/2023  Allergen Reaction Noted   Janumet [sitagliptin-metformin hcl] Nausea And Vomiting  10/03/2018   Robaxin [methocarbamol] Swelling 07/31/2013      Review of Systems   General: Negative for anorexia, weight loss, fever, chills, +fatigue, weakness. ENT: Negative for hoarseness, difficulty swallowing , nasal congestion. CV: Negative for chest pain, angina, palpitations, dyspnea on exertion, peripheral edema.  Respiratory: Negative for dyspnea at rest, dyspnea on exertion, cough, sputum, wheezing.  GI: See history of present illness. GU:  Negative for dysuria, hematuria, urinary incontinence, urinary frequency, nocturnal urination.  Endo: Negative for unusual weight change.     Physical Exam   BP 136/83 (BP Location: Right Arm, Patient Position: Sitting, Cuff Size: Large)   Pulse (!) 112   Temp 97.8 F (36.6 C) (Temporal)   Ht 5\' 3"  (1.6 m)   Wt 246 lb 12.8 oz (111.9 kg)   BMI 43.72 kg/m    General: Well-nourished, well-developed in no acute distress.  Eyes: No icterus. Mouth: Oropharyngeal mucosa moist and pink    Abdomen: Bowel sounds are normal,   nondistended, no hepatosplenomegaly or masses,  no abdominal bruits or hernia , no rebound or guarding. Mild tenderness throughout abdomen Rectal: not performed  Extremities: No lower extremity edema. No clubbing or deformities. Neuro: Alert and oriented x 4   Skin: Warm and dry, no jaundice.   Psych: Alert and cooperative, normal mood and affect.  Labs   See hpi  Imaging Studies   CT ABDOMEN PELVIS W CONTRAST Result Date: 11/07/2023 CLINICAL DATA:  Abdominal pain EXAM: CT ABDOMEN AND PELVIS WITH CONTRAST TECHNIQUE: Multidetector CT imaging of the abdomen and pelvis was performed using the standard protocol following bolus administration of intravenous contrast. RADIATION DOSE REDUCTION: This exam was performed according to the departmental dose-optimization program which includes automated exposure control, adjustment of the mA and/or kV according to patient size and/or use of iterative reconstruction technique.  CONTRAST:  80mL OMNIPAQUE IOHEXOL 300 MG/ML  SOLN COMPARISON:  None Available. FINDINGS: Lower chest: No acute abnormality Hepatobiliary: No focal hepatic abnormality. Gallbladder unremarkable. Pancreas: No focal abnormality or ductal dilatation. Spleen: No focal abnormality.  Normal size. Adrenals/Urinary Tract: No adrenal abnormality. No focal renal abnormality. No stones or hydronephrosis. Urinary bladder is unremarkable. Stomach/Bowel: Normal appendix. Mild wall thickening within the ascending colon today hepatic flexure suggesting colitis. Stomach and small bowel decompressed. No bowel obstruction. Vascular/Lymphatic: No evidence of aneurysm or adenopathy. Reproductive: Prior hysterectomy.  No adnexal masses. Other: No free fluid or free air. Musculoskeletal: No acute bony abnormality. IMPRESSION: Mild apparent wall thickening within the right colon suggesting colitis. Recommend clinical correlation. Electronically Signed   By: Charlett Nose M.D.   On: 11/07/2023 20:13    Assessment/Plan:   Colitis: recent admission with acute onset diarrhea, CT showing right sided colitis. Stool studies negative. Colonoscopy one year ago with adenomatous colon polyps but otherwise unremarkable. Clinically feeling better at this time. -continue to monitor for now. If she  returns to baseline, then no further work up needed. If not, she may need additional work up including a colonoscopy -return to the office in 3 months. Call sooner if needed.  MASH (formerly NASH)  -LFTs normal. -Abd u/s with elastography last year with low median kPa,QR/Median kPa ratio: 0.46  -recent CT of liver unremarkable -NAFLD score 2.28 with risk for F3-F4.  -return ov in 3 months  Colten Desroches S. Melvyn Neth, MHS, PA-C [facility]

## 2023-11-15 NOTE — Patient Instructions (Signed)
Your bowel function and abdominal pain will gradually resolve after acute infections/colitis. Give it a couple of more weeks, but if not feeling nearly back to normal by then, please call and let me know. Since you have had a recent normal colonoscopy we do not necessarily need to repeat that right now but if you continue to have bowel concerns, we would consider.  I would like for your to return to the office in about 3 months for follow up. We can also follow up on your fatty liver at that time. Your liver numbers are normal but we need to make sure we keep a check on your liver.

## 2023-11-15 NOTE — Progress Notes (Deleted)
Energy not back yet.  Elctrolytes. BM two per daily. Soft.  No melena, brbpr. Cramping.  N/v. Cereal, soups.

## 2023-11-16 ENCOUNTER — Encounter: Payer: Self-pay | Admitting: Family Medicine

## 2023-11-16 ENCOUNTER — Ambulatory Visit (INDEPENDENT_AMBULATORY_CARE_PROVIDER_SITE_OTHER): Payer: Medicare Other | Admitting: Family Medicine

## 2023-11-16 VITALS — BP 118/78 | HR 102 | Ht 63.0 in | Wt 245.0 lb

## 2023-11-16 DIAGNOSIS — Z794 Long term (current) use of insulin: Secondary | ICD-10-CM

## 2023-11-16 DIAGNOSIS — I10 Essential (primary) hypertension: Secondary | ICD-10-CM

## 2023-11-16 DIAGNOSIS — K529 Noninfective gastroenteritis and colitis, unspecified: Secondary | ICD-10-CM | POA: Diagnosis not present

## 2023-11-16 DIAGNOSIS — N182 Chronic kidney disease, stage 2 (mild): Secondary | ICD-10-CM

## 2023-11-16 DIAGNOSIS — E1122 Type 2 diabetes mellitus with diabetic chronic kidney disease: Secondary | ICD-10-CM | POA: Diagnosis not present

## 2023-11-16 DIAGNOSIS — Z09 Encounter for follow-up examination after completed treatment for conditions other than malignant neoplasm: Secondary | ICD-10-CM

## 2023-11-16 NOTE — Patient Instructions (Signed)
F/U  in 12 days re evaluate blood pressure, xcall if you need me sooner  Hold ozempic for and additonal 2 weeks, start taking short term tresiba 35 units , and watch carbs so blood sugar fasting is between 100 to 130   No losartan now, blood pressure is normal now, at next visit adjustment may need to be made as far as bvlood pressure medication.  I encourage you to check blood pressure at home every day also   For chrionic daily headaches ,  you will be referred top a Neurologist   Best for 2025!  Thanks for choosing [facility], we consider it a privelige to serve you.

## 2023-11-21 ENCOUNTER — Other Ambulatory Visit: Payer: Self-pay

## 2023-11-21 ENCOUNTER — Encounter (HOSPITAL_COMMUNITY): Payer: Self-pay | Admitting: *Deleted

## 2023-11-21 ENCOUNTER — Emergency Department (HOSPITAL_COMMUNITY)
Admission: EM | Admit: 2023-11-21 | Discharge: 2023-11-21 | Disposition: A | Discharge status: Home / Self Care | Payer: Medicare Other | Attending: Emergency Medicine | Admitting: Emergency Medicine

## 2023-11-21 ENCOUNTER — Telehealth: Payer: Self-pay

## 2023-11-21 ENCOUNTER — Emergency Department (HOSPITAL_COMMUNITY): Payer: Medicare Other

## 2023-11-21 DIAGNOSIS — J45909 Unspecified asthma, uncomplicated: Secondary | ICD-10-CM | POA: Insufficient documentation

## 2023-11-21 DIAGNOSIS — R197 Diarrhea, unspecified: Secondary | ICD-10-CM | POA: Diagnosis not present

## 2023-11-21 DIAGNOSIS — I10 Essential (primary) hypertension: Secondary | ICD-10-CM | POA: Diagnosis not present

## 2023-11-21 DIAGNOSIS — Z79899 Other long term (current) drug therapy: Secondary | ICD-10-CM | POA: Diagnosis not present

## 2023-11-21 DIAGNOSIS — R531 Weakness: Secondary | ICD-10-CM | POA: Insufficient documentation

## 2023-11-21 DIAGNOSIS — R5383 Other fatigue: Secondary | ICD-10-CM | POA: Insufficient documentation

## 2023-11-21 DIAGNOSIS — R1032 Left lower quadrant pain: Secondary | ICD-10-CM | POA: Insufficient documentation

## 2023-11-21 DIAGNOSIS — K6389 Other specified diseases of intestine: Secondary | ICD-10-CM | POA: Diagnosis not present

## 2023-11-21 DIAGNOSIS — E119 Type 2 diabetes mellitus without complications: Secondary | ICD-10-CM | POA: Diagnosis not present

## 2023-11-21 DIAGNOSIS — Z794 Long term (current) use of insulin: Secondary | ICD-10-CM | POA: Diagnosis not present

## 2023-11-21 LAB — COMPREHENSIVE METABOLIC PANEL
ALT: 28 U/L (ref 0–44)
AST: 28 U/L (ref 15–41)
Albumin: 4.2 g/dL (ref 3.5–5.0)
Alkaline Phosphatase: 54 U/L (ref 38–126)
Anion gap: 10 (ref 5–15)
BUN: 15 mg/dL (ref 8–23)
CO2: 28 mmol/L (ref 22–32)
Calcium: 9.1 mg/dL (ref 8.9–10.3)
Chloride: 99 mmol/L (ref 98–111)
Creatinine, Ser: 1.12 mg/dL — ABNORMAL HIGH (ref 0.44–1.00)
GFR, Estimated: 56 mL/min — ABNORMAL LOW (ref >60)
Glucose, Bld: 141 mg/dL — ABNORMAL HIGH (ref 70–99)
Potassium: 3.9 mmol/L (ref 3.5–5.1)
Sodium: 137 mmol/L (ref 135–145)
Total Bilirubin: 0.3 mg/dL (ref <1.2)
Total Protein: 8.3 g/dL — ABNORMAL HIGH (ref 6.5–8.1)

## 2023-11-21 LAB — URINALYSIS, ROUTINE W REFLEX MICROSCOPIC
Bilirubin Urine: NEGATIVE
Glucose, UA: NEGATIVE mg/dL
Hgb urine dipstick: NEGATIVE
Ketones, ur: NEGATIVE mg/dL
Leukocytes,Ua: NEGATIVE
Nitrite: NEGATIVE
Protein, ur: NEGATIVE mg/dL
Specific Gravity, Urine: 1.008 (ref 1.005–1.030)
pH: 5 (ref 5.0–8.0)

## 2023-11-21 LAB — CBC
HCT: 38.1 % (ref 36.0–46.0)
Hemoglobin: 11.8 g/dL — ABNORMAL LOW (ref 12.0–15.0)
MCH: 26.9 pg (ref 26.0–34.0)
MCHC: 31 g/dL (ref 30.0–36.0)
MCV: 86.8 fL (ref 80.0–100.0)
Platelets: 177 10*3/uL (ref 150–400)
RBC: 4.39 MIL/uL (ref 3.87–5.11)
RDW: 13.4 % (ref 11.5–15.5)
WBC: 9.6 10*3/uL (ref 4.0–10.5)
nRBC: 0 % (ref 0.0–0.2)

## 2023-11-21 LAB — LIPASE, BLOOD: Lipase: 77 U/L — ABNORMAL HIGH (ref 11–51)

## 2023-11-21 MED ORDER — ONDANSETRON HCL 4 MG/2ML IJ SOLN
4.0000 mg | Freq: Once | INTRAMUSCULAR | Status: AC
  Administered 2023-11-21: 4 mg via INTRAVENOUS
  Filled 2023-11-21: qty 2

## 2023-11-21 MED ORDER — IOHEXOL 300 MG/ML  SOLN
100.0000 mL | Freq: Once | INTRAMUSCULAR | Status: AC | PRN
  Administered 2023-11-21: 100 mL via INTRAVENOUS

## 2023-11-21 MED ORDER — FENTANYL CITRATE PF 50 MCG/ML IJ SOSY
50.0000 ug | PREFILLED_SYRINGE | Freq: Once | INTRAMUSCULAR | Status: AC
  Administered 2023-11-21: 50 ug via INTRAVENOUS
  Filled 2023-11-21: qty 1

## 2023-11-21 NOTE — ED Provider Triage Note (Signed)
Emergency Medicine Provider Triage Evaluation Note  Leslie Lyons , a 62 y.o. female  was evaluated in triage.  Pt complains of return of her diarrhea 2 days ago.  Was admitted here last week for colitis and dehydration.  Feels weak, abdominal cramping,  no fevers, no n/v.  Review of Systems  Positive: Diarrhea, abd cramping Negative: N/v, fever  Physical Exam  BP 133/79 (BP Location: Right Arm)   Pulse 100   Temp 98 F (36.7 C)   Resp 17   Ht 5\' 3"  (1.6 m)   Wt 111.1 kg   SpO2 96%   BMI 43.39 kg/m  Gen:   Awake, no distress   Resp:  Normal effort  MSK:   Moves extremities without difficulty  Other:    Medical Decision Making  Medically screening exam initiated at 2:15 PM.  Appropriate orders placed.  Leslie Lyons was informed that the remainder of the evaluation will be completed by another provider, this initial triage assessment does not replace that evaluation, and the importance of remaining in the ED until their evaluation is complete.     Burgess Amor, PA-C 11/21/23 1417

## 2023-11-21 NOTE — Discharge Instructions (Signed)
Your lab tests and CT imaging are reassuring.  Your CT suggest that your colitis is still present but it appears to be improving.  Make sure you are drinking plenty of fluids and continue using your Imodium as needed for diarrhea.

## 2023-11-21 NOTE — Telephone Encounter (Signed)
Patient aware.

## 2023-11-21 NOTE — ED Triage Notes (Signed)
Pt was seen here a couple of weeks ago and admitted for colitis  Pt states x 2 days ago she started having diarrhea and weakness

## 2023-11-21 NOTE — Telephone Encounter (Signed)
Copied from CRM [id number]. Topic: Clinical - Medical Advice >> Nov 21, 2023  8:14 AM Donita Brooks wrote: Reason for CRM: pt was hospitalized. It started back up on Saturday, the diarrhea. pt would like to know what directions should she go about this - call back [phone]

## 2023-11-21 NOTE — ED Provider Notes (Signed)
[place] [facility] AT [facility] Provider Note   CSN: [id number] Arrival date & time: 11/21/23  1202     History  Chief Complaint  Patient presents with   Diarrhea    Leslie Lyons is a 62 y.o. female with a history including hypertension, asthma, type 2 diabetes and hyperlipidemia who was Admitted to the hospital earlier this month for several days secondary to colitis and diarrhea, presenting here today secondary to return of her diarrhea over the past 2 days.  She describes 4-5 episodes of nonbloody diarrhea daily, she endorses general weakness and fatigue since her symptoms return.  She does have some abdominal pain which localizes to the left lower quadrant.  Of note her colitis previously was in the right ascending colon.  She denies fevers or chills, nausea or vomiting.  She has been able to maintain fluid intake and has been continuing to take Imodium.  The history is provided by the patient.       Home Medications Prior to Admission medications   Medication Sig Start Date End Date Taking? Authorizing Provider  albuterol (VENTOLIN HFA) 108 (90 Base) MCG/ACT inhaler INHALE 2 PUFFS BY MOUTH EVERY 6 HOURS AS NEEDED 12/29/22   [name], MD  blood glucose meter kit and supplies Dispense based on patient and insurance preference. Test blood sugar once daily before breakfast. (FOR ICD-10 E10.9, E11.9). 10/30/20   [name], MD  Blood Glucose Monitoring Suppl (ONETOUCH VERIO FLEX SYSTEM) w/Device KIT Use to monitor glucose twice daily 04/23/22   [name], NP  cholecalciferol (VITAMIN D3) 25 MCG (1000 UNIT) tablet Take 1,000 Units by mouth daily.    [provider]  glucose blood (ONETOUCH VERIO) test strip USE 1 STRIP TO CHECK GLUCOSE TWICE DAILY 09/21/23   [name], NP  insulin degludec (TRESIBA FLEXTOUCH) 100 UNIT/ML FlexTouch Pen Inject 30 Units into the skin at bedtime. 05/04/23   [name], NP   Insulin Pen Needle (BD PEN NEEDLE NANO 2ND GEN) 32G X 4 MM MISC USE TO INJECT INSULIN ONCE DAILY 11/01/23   [name], NP  Lancets [facility] ULTRASOFT) lancets Use as instructed to monitor glucose twice daily 04/23/22   [name], NP  loperamide (IMODIUM A-D) 2 MG tablet Take 1 tablet (2 mg total) by mouth 4 (four) times daily as needed for diarrhea or loose stools. 11/09/23   Alberteen Sam, MD  losartan (COZAAR) 100 MG tablet Take 1 tablet by mouth once daily 08/16/23   [name], MD  mirabegron ER (MYRBETRIQ) 50 MG TB24 tablet Take 1 tablet (50 mg total) by mouth daily. 03/15/23   [name], MD  mometasone-formoterol (DULERA) 200-5 MCG/ACT AERO Inhale 2 puffs into the lungs 2 (two) times daily. 04/22/22   [name], MD  MYRBETRIQ 50 MG TB24 tablet Take 1 tablet (50 mg total) by mouth daily. 10/12/23   [name], MD  omega-3 acid ethyl esters (LOVAZA) 1 g capsule Take 2 capsules (2 g total) by mouth 2 (two) times daily. 05/13/23   [name], [name], MD  rosuvastatin (CRESTOR) 40 MG tablet Take 1 tablet (40 mg total) by mouth at bedtime. 04/01/23 11/15/23  [name], [name], MD  Semaglutide (OZEMPIC, 1 MG/DOSE, [place]) Inject 1 mg into the skin once a week. Patient not taking: Reported on 11/16/2023    [provider]  spironolactone (ALDACTONE) 25 MG tablet Take 1 tablet (25 mg total)  by mouth daily. 07/05/23   [name], MD  traZODone (DESYREL) 100 MG tablet Take 1 tablet (100 mg total) by mouth at bedtime. 07/11/23   [name], MD      Allergies    Janumet [sitagliptin-metformin hcl] and Robaxin [methocarbamol]    Review of Systems   Review of Systems  Constitutional:  Negative for fever.  HENT:  Negative for congestion and sore throat.   Eyes: Negative.   Respiratory:  Negative for chest tightness and shortness of breath.   Cardiovascular:  Negative for chest pain.  Gastrointestinal:  Positive for  abdominal pain and diarrhea. Negative for nausea and vomiting.  Genitourinary: Negative.   Musculoskeletal:  Negative for arthralgias, joint swelling and neck pain.  Skin: Negative.  Negative for rash and wound.  Neurological:  Negative for dizziness, weakness, light-headedness, numbness and headaches.  Psychiatric/Behavioral: Negative.    All other systems reviewed and are negative.   Physical Exam Updated Vital Signs BP 135/77 (BP Location: Left Arm)   Pulse 89   Temp 98.7 F (37.1 C) (Oral)   Resp 17   Ht 5\' 3"  (1.6 m)   Wt 111.1 kg   SpO2 100%   BMI 43.39 kg/m  Physical Exam Vitals and nursing note reviewed.  Constitutional:      Appearance: She is well-developed.  HENT:     Head: Normocephalic and atraumatic.  Eyes:     Conjunctiva/sclera: Conjunctivae normal.  Cardiovascular:     Rate and Rhythm: Normal rate and regular rhythm.     Heart sounds: Normal heart sounds.  Pulmonary:     Effort: Pulmonary effort is normal.     Breath sounds: Normal breath sounds. No wheezing.  Abdominal:     General: Bowel sounds are normal.     Palpations: Abdomen is soft.     Tenderness: There is abdominal tenderness in the left lower quadrant. There is no guarding or rebound.  Musculoskeletal:        General: Normal range of motion.     Cervical back: Normal range of motion.  Skin:    General: Skin is warm and dry.  Neurological:     General: No focal deficit present.     Mental Status: She is alert.     ED Results / Procedures / Treatments   Labs (all labs ordered are listed, but only abnormal results are displayed) Labs Reviewed  LIPASE, BLOOD - Abnormal; Notable for the following components:      Result Value   Lipase 77 (*)    All other components within normal limits  COMPREHENSIVE METABOLIC PANEL - Abnormal; Notable for the following components:   Glucose, Bld 141 (*)    Creatinine, Ser 1.12 (*)    Total Protein 8.3 (*)    GFR, Estimated 56 (*)    All other  components within normal limits  CBC - Abnormal; Notable for the following components:   Hemoglobin 11.8 (*)    All other components within normal limits  URINALYSIS, ROUTINE W REFLEX MICROSCOPIC - Abnormal; Notable for the following components:   Color, Urine STRAW (*)    All other components within normal limits    EKG None  Radiology CT ABDOMEN PELVIS W CONTRAST Result Date: 11/21/2023 CLINICAL DATA:  Two day history of diarrhea and weakness EXAM: CT ABDOMEN AND PELVIS WITH CONTRAST TECHNIQUE: Multidetector CT imaging of the abdomen and pelvis was performed using the standard protocol following bolus administration of intravenous contrast. RADIATION DOSE REDUCTION:  This exam was performed according to the departmental dose-optimization program which includes automated exposure control, adjustment of the mA and/or kV according to patient size and/or use of iterative reconstruction technique. CONTRAST:  OMNIPAQUE IOHEXOL 300 MG/ML  SOLN COMPARISON:  CT abdomen and pelvis dated 11/07/2023 FINDINGS: Lower chest: No focal consolidation or pulmonary nodule in the lung bases. No pleural effusion or pneumothorax demonstrated. Partially imaged heart size is normal. Hepatobiliary: No focal hepatic lesions. No intra or extrahepatic biliary ductal dilation. Normal gallbladder. Pancreas: No focal lesions or main ductal dilation. Spleen: Normal in size without focal abnormality. Adrenals/Urinary Tract: No adrenal nodules. No suspicious renal mass, calculi or hydronephrosis. No focal bladder wall thickening. Stomach/Bowel: Normal appearance of the stomach. Persistent short-segment mild mural thickening of the ascending colon. No abnormal bowel dilation. Normal appendix. Vascular/Lymphatic: Aortic atherosclerosis. No enlarged abdominal or pelvic lymph nodes. Reproductive: No adnexal masses. Other: No free fluid, fluid collection, or free air. Musculoskeletal: No acute or abnormal lytic or blastic osseous  lesions. Postsurgical changes of the anterior abdominal wall. IMPRESSION: 1. Persistent short-segment mild mural thickening of the ascending colon, which may be related to underdistention or resolving colitis. 2.  Aortic Atherosclerosis (ICD10-I70.0). Electronically Signed   By: Agustin Cree M.D.   On: 11/21/2023 19:49    Procedures Procedures    Medications Ordered in ED Medications  fentaNYL (SUBLIMAZE) injection 50 mcg (50 mcg Intravenous Given 11/21/23 1742)  ondansetron (ZOFRAN) injection 4 mg (4 mg Intravenous Given 11/21/23 1743)  iohexol (OMNIPAQUE) 300 MG/ML solution 100 mL (100 mLs Intravenous Contrast Given 11/21/23 1818)    ED Course/ Medical Decision Making/ A&P                                 Medical Decision Making Patient presenting with return of her diarrhea and localizing pain to the left lower quadrant, this could be return of her colitis although her colitis is in her right ascending colon and her pain is in her left lower quadrant, raising concerns about whether this could be worsening expanding colitis versus diverticulitis.  Her stools have been nonbloody, she has been afebrile.  She was here in the department for greater than 7 hours and actually did not have any diarrhea during that time.  Labs and imaging per below are reassuring.  She is actually feeling improved at reevaluation, she is not guarding, her left lower quadrant is minimally tender and she has no pain in her right abdomen.  We discussed home treatment, particularly foods that she may eat, she has been maintaining clear liquids although has been sneaking in [place] foods as well.  She complains of feeling hungry and she would like to eat, she was encouraged to slowly increase her diet as tolerated to a more normal stable diet.  Continue Imodium as needed, continue to concentrate on increasing fluid intake.  Advised as needed follow-up with her primary provider and her GI specialist as needed.  Amount and/or  Complexity of Data Reviewed Labs: ordered.    Details: Labs are reassuring, her c-Met is unremarkable although she does have a slight bump in her creatinine at 1.12, her BUN is normal range at 15.  She has a normal WBC count at 9.6.  There is a slight bump in her lipase at 77, no tenderness in her upper abdomen. Radiology: ordered.    Details: CT confirms probable resolving colitis in the ascending colon, no worsening  symptoms appreciated.  No diverticulitis.  Risk Prescription drug management.           Final Clinical Impression(s) / ED Diagnoses Final diagnoses:  Diarrhea, unspecified type    Rx / DC Orders ED Discharge Orders     None         Victoriano Lain 11/21/23 2130    Loetta Rough, MD 11/21/23 2155

## 2023-11-22 ENCOUNTER — Telehealth: Payer: Self-pay

## 2023-11-22 NOTE — Transitions of Care (Post Inpatient/ED Visit) (Signed)
   11/22/2023  Name: Leslie Lyons MRN: [id number] DOB: Jun 08, 1961  Today's TOC FU Call Status: Today's TOC FU Call Status:: Unsuccessful Call (1st Attempt) Unsuccessful Call (1st Attempt) Date: 11/22/23  Attempted to reach the patient regarding the most recent Inpatient/ED visit.  Follow Up Plan: Additional outreach attempts will be made to reach the patient to complete the Transitions of Care (Post Inpatient/ED visit) call.   Signature  Kandis Fantasia, LPN [facility] Health Advisor [place] [facility] You Are. We Are. [facility] Direct Dial [phone]

## 2023-11-22 NOTE — Telephone Encounter (Signed)
Copied from CRM [id number]. Topic: General - Other >> Nov 22, 2023 10:51 AM Carlatta H wrote: Reason for CRM: Patient received a call from the office//Please return patients call

## 2023-12-01 ENCOUNTER — Ambulatory Visit (INDEPENDENT_AMBULATORY_CARE_PROVIDER_SITE_OTHER): Payer: Medicare Other | Admitting: Family Medicine

## 2023-12-01 ENCOUNTER — Encounter: Payer: Self-pay | Admitting: Family Medicine

## 2023-12-01 VITALS — BP 124/78 | HR 102 | Ht 63.0 in | Wt 245.1 lb

## 2023-12-01 DIAGNOSIS — K529 Noninfective gastroenteritis and colitis, unspecified: Secondary | ICD-10-CM | POA: Diagnosis not present

## 2023-12-01 DIAGNOSIS — J4540 Moderate persistent asthma, uncomplicated: Secondary | ICD-10-CM | POA: Diagnosis not present

## 2023-12-01 DIAGNOSIS — H6591 Unspecified nonsuppurative otitis media, right ear: Secondary | ICD-10-CM | POA: Insufficient documentation

## 2023-12-01 DIAGNOSIS — J0100 Acute maxillary sinusitis, unspecified: Secondary | ICD-10-CM

## 2023-12-01 DIAGNOSIS — J209 Acute bronchitis, unspecified: Secondary | ICD-10-CM | POA: Diagnosis not present

## 2023-12-01 DIAGNOSIS — Z09 Encounter for follow-up examination after completed treatment for conditions other than malignant neoplasm: Secondary | ICD-10-CM | POA: Diagnosis not present

## 2023-12-01 DIAGNOSIS — I10 Essential (primary) hypertension: Secondary | ICD-10-CM

## 2023-12-01 MED ORDER — BENZONATATE 100 MG PO CAPS: 100.0000 mg | ORAL_CAPSULE | Freq: Two times a day (BID) | ORAL | 0 refills | Status: DC | PRN

## 2023-12-01 MED ORDER — AZITHROMYCIN 250 MG PO TABS: ORAL_TABLET | ORAL | 0 refills | Status: AC

## 2023-12-01 MED ORDER — PROMETHAZINE-DM 6.25-15 MG/5ML PO SYRP
ORAL_SOLUTION | ORAL | 0 refills | Status: DC
Start: 2023-12-01 — End: 2024-04-26

## 2023-12-01 MED ORDER — MONTELUKAST SODIUM 10 MG PO TABS: 10.0000 mg | ORAL_TABLET | Freq: Every day | ORAL | 3 refills | Status: AC

## 2023-12-01 NOTE — Progress Notes (Signed)
   Leslie Lyons     MRN: [id number]      DOB: 1961/06/03  Chief Complaint  Patient presents with   Follow-up    Bp follow up, cough    HPI Leslie Lyons is here for follow up of recent ED visit on 11/21/2023, when she was back in the Ed with diarrhea. This has subsequently dissipated, Loose stools are less down to about 3/day and more formed and she is being followed by GI  Sneezing runny  nose congestion, right ear ache, nyquil not helping, started 5 days ago ROS Denies recent fever or chills. C/o sinus pressure, nasal congestion, ear pain denies  sore throat. C/o  chest congestion and  productive cough , chest pain with excess cough Denies  palpitations and leg swelling .   Denies dysuria, frequency, hesitancy or incontinence. Denies joint pain, swelling and limitation in mobility. Denies headaches, seizures, numbness, or tingling. Denies depression, anxiety or insomnia. Denies skin break down or rash.   PE  BP 124/78   Pulse (!) 102   Ht 5' 3 (1.6 m)   Wt 245 lb 1.9 oz (111.2 kg)   SpO2 91%   BMI 43.42 kg/m   Patient alert and oriented and in mild  cardiopulmonary distress.  HEENT: No facial asymmetry, EOMI,     Neck supple .Right TM erythematous with reduced light reflex, positive maxillary sinus tenderness  Chest: decreased air entry ,  crackles and wheezes bilaterally.  CVS: S1, S2 no murmurs, no S3.Regular rate.  ABD: Soft non tender.   Ext: No edema  MS: Adequate ROM spine, shoulders, hips and knees.  Skin: Intact, no ulcerations or rash noted.  Psych: Good eye contact, normal affect. Memory intact not anxious or depressed appearing.  CNS: CN 2-12 intact, power,  normal throughout.no focal deficits noted.   Assessment & Plan  Right otitis media with effusion Z pack prescribed  Maxillary sinusitis, acute Z pack prescribed  Acute bronchitis Z pack, tessalon  perles, phenergan  dm, albuterol  MDI as needed  Asthma in adult Current flare due  toacute infection, advised albuterol  every 8 hours, ED if condition worsens  Essential hypertension, benign Elevated at visit , however acutely ill and recent excessive use of OTC decongestants  Colitis Being followed /managed by GI. Stool is becoming more formed, has upcoming appt scheduled  Encounter for examination following treatment at hospital Patient in for follow up of recent ED visit. Discharge summary, and laboratory and radiology data are reviewed, and any questions or concerns  are discussed. Specific issues requiring follow up are specifically addressed.

## 2023-12-01 NOTE — Patient Instructions (Signed)
 Keep March appointment already scheduled, call if you need me sooner  Azithromycin , tessalon  perles, phenergan  dM and montelukast  are prescribed today for right ear infection, sinusitis and bronchitis.  Blood pressure is controlled , stay on current meds  If ear symoptoms persist or worsen you will need to see an ENT specilaist, do not hesitate to reach out so I can refer you if needed  Thanks for choosing [facility], we consider it a privelige to serve you.

## 2023-12-05 ENCOUNTER — Other Ambulatory Visit: Payer: Self-pay | Admitting: Family Medicine

## 2023-12-05 DIAGNOSIS — J0100 Acute maxillary sinusitis, unspecified: Secondary | ICD-10-CM | POA: Insufficient documentation

## 2023-12-05 DIAGNOSIS — Z09 Encounter for follow-up examination after completed treatment for conditions other than malignant neoplasm: Secondary | ICD-10-CM | POA: Insufficient documentation

## 2023-12-05 NOTE — Assessment & Plan Note (Signed)
 Z pack prescribed

## 2023-12-05 NOTE — Assessment & Plan Note (Signed)
Patient in for follow up of recent ED visit. Discharge summary, and laboratory and radiology data are reviewed, and any questions or concerns  are discussed. Specific issues requiring follow up are specifically addressed.  

## 2023-12-05 NOTE — Assessment & Plan Note (Signed)
 Current flare due toacute infection, advised albuterol every 8 hours, ED if condition worsens

## 2023-12-05 NOTE — Assessment & Plan Note (Signed)
 Being followed /managed by GI. Stool is becoming more formed, has upcoming appt scheduled

## 2023-12-05 NOTE — Assessment & Plan Note (Signed)
 Z pack, tessalon perles, phenergan dm, albuterol MDI as needed

## 2023-12-05 NOTE — Assessment & Plan Note (Signed)
 Elevated at visit , however acutely ill and recent excessive use of OTC decongestants

## 2023-12-06 NOTE — Assessment & Plan Note (Signed)
 Controlled and on no medication, home BP monitoring and re eval in 12 days

## 2023-12-06 NOTE — Assessment & Plan Note (Signed)
 Continued improvement in symptoms gradually, pt to hold ozempic until re eval and manage blood sugar with insulin only , parameters provided in writing for FBG goal

## 2023-12-06 NOTE — Assessment & Plan Note (Signed)
 Patient in for follow up of recent hospitalizationfor colitis from 12/09 to 12/112024 Discharge summary, and laboratory and radiology data are reviewed, and any questions or concerns  are discussed. Specific issues requiring follow up are specifically addressed.

## 2023-12-06 NOTE — Assessment & Plan Note (Addendum)
 Diabetes associated with hypertension and CKD  Leslie Lyons is reminded of the importance of commitment to daily physical activity for 30 minutes or more, as able and the need to limit carbohydrate intake to 30 to 60 grams per meal to help with blood sugar control.   The need to take medication as prescribed, test blood sugar as directed, and to call between visits if there is a concern that blood sugar is uncontrolled is also discussed.   Leslie Lyons is reminded of the importance of daily foot exam, annual eye examination, and good blood sugar, blood pressure and cholesterol control.     Latest Ref Rng & Units 11/21/2023   12:44 PM 11/09/2023    4:17 AM 11/08/2023    5:29 AM 11/07/2023    4:49 PM 09/07/2023    2:58 PM  Diabetic Labs  HbA1c 4.0 - 5.6 %     7.5   Creatinine 0.44 - 1.00 mg/dL 8.87  9.05  8.85  8.58        12/01/2023    2:49 PM 12/01/2023    1:49 PM 12/01/2023    1:48 PM 11/21/2023    8:00 PM 11/21/2023    6:00 PM 11/21/2023    4:45 PM 11/21/2023    4:30 PM  BP/Weight  Systolic BP 124 132 147 135 100 117 132  Diastolic BP 78 86 88 77 67 75 69  Wt. (Lbs)   245.12      BMI   43.42 kg/m2          Latest Ref Rng & Units 10/12/2023    9:00 AM 04/14/2023   12:00 AM  Foot/eye exam completion dates  Eye Exam No Retinopathy  No Retinopathy      Foot Form Completion  Done      This result is from an external source.   Managed by [facility], not yet at goal , will hold ozempic  short term due to ongoing GI sympoms , insulin  to be continued , re eval in 12 to 14 days

## 2023-12-07 ENCOUNTER — Other Ambulatory Visit: Payer: Self-pay | Admitting: Family Medicine

## 2023-12-22 ENCOUNTER — Telehealth: Payer: Self-pay | Admitting: Nurse Practitioner

## 2023-12-22 NOTE — Telephone Encounter (Signed)
2025 re enrollment form for [employer] PAP mailed to pt.

## 2023-12-26 ENCOUNTER — Ambulatory Visit (INDEPENDENT_AMBULATORY_CARE_PROVIDER_SITE_OTHER): Payer: Medicare Other

## 2023-12-26 VITALS — Ht 63.0 in | Wt 244.0 lb

## 2023-12-26 DIAGNOSIS — Z0000 Encounter for general adult medical examination without abnormal findings: Secondary | ICD-10-CM | POA: Diagnosis not present

## 2023-12-26 NOTE — Patient Instructions (Signed)
[name] , Thank you for taking time to come for your Medicare Wellness Visit. I appreciate your ongoing commitment to your health goals. Please review the following plan we discussed and let me know if I can assist you in the future.   Referrals/Orders/Follow-Ups/Clinician Recommendations:  Next Medicare Annual Wellness Visit:December 26, 2024 at 9:20am     This is a list of the screening recommended for you and due dates:  Health Maintenance  Topic Date Due   DTaP/Tdap/Td vaccine (2 - Tdap) 03/27/2019   COVID-19 Vaccine (2 - 2024-25 season) 07/31/2023   Zoster (Shingles) Vaccine (2 of 2) 11/05/2023   Hemoglobin A1C  03/07/2024   Eye exam for diabetics  04/13/2024   Mammogram  05/03/2024   Yearly kidney health urinalysis for diabetes  07/04/2024   Complete foot exam   10/23/2024   Yearly kidney function blood test for diabetes  11/20/2024   Medicare Annual Wellness Visit  12/25/2024   Colon Cancer Screening  10/09/2027   Pap with HPV screening  02/04/2028   Pneumococcal Vaccination  Completed   Flu Shot  Completed   Hepatitis C Screening  Completed   HIV Screening  Completed   HPV Vaccine  Aged Out    Advanced directives: (ACP Link)Information on Advanced Care Planning can be found at [facility] Advance Health Care Directives Advance Health Care Directives ([email or web address]   Next Medicare Annual Wellness Visit scheduled for next year: yes  Preventive Care 73-12 Years Old, Female Preventive care refers to lifestyle choices and visits with your health care provider that can promote health and wellness. Preventive care visits are also called wellness exams. What can I expect for my preventive care visit? Counseling Your health care provider may ask you questions about your: Medical history, including: Past medical problems. Family medical history. Pregnancy history. Current health, including: Menstrual cycle. Method of birth control. Emotional  well-being. Home life and relationship well-being. Sexual activity and sexual health. Lifestyle, including: Alcohol, nicotine or tobacco, and drug use. Access to firearms. Diet, exercise, and sleep habits. Work and work Astronomer. Sunscreen use. Safety issues such as seatbelt and bike helmet use. Physical exam Your health care provider will check your: Height and weight. These may be used to calculate your BMI (body mass index). BMI is a measurement that tells if you are at a healthy weight. Waist circumference. This measures the distance around your waistline. This measurement also tells if you are at a healthy weight and may help predict your risk of certain diseases, such as type 2 diabetes and high blood pressure. Heart rate and blood pressure. Body temperature. Skin for abnormal spots. What immunizations do I need?  Vaccines are usually given at various ages, according to a schedule. Your health care provider will recommend vaccines for you based on your age, medical history, and lifestyle or other factors, such as travel or where you work. What tests do I need? Screening Your health care provider may recommend screening tests for certain conditions. This may include: Lipid and cholesterol levels. Diabetes screening. This is done by checking your blood sugar (glucose) after you have not eaten for a while (fasting). Pelvic exam and Pap test. Hepatitis B test. Hepatitis C test. HIV (human immunodeficiency virus) test. STI (sexually transmitted infection) testing, if you are at risk. Lung cancer screening. Colorectal cancer screening. Mammogram. Talk with your health care provider about when you should start having regular mammograms. This may depend on whether you have a family history  of breast cancer. BRCA-related cancer screening. This may be done if you have a family history of breast, ovarian, tubal, or peritoneal cancers. Bone density scan. This is done to screen for  osteoporosis. Talk with your health care provider about your test results, treatment options, and if necessary, the need for more tests. Follow these instructions at home: Eating and drinking  Eat a diet that includes fresh fruits and vegetables, whole grains, lean protein, and low-fat dairy products. Take vitamin and mineral supplements as recommended by your health care provider. Do not drink alcohol if: Your health care provider tells you not to drink. You are pregnant, may be pregnant, or are planning to become pregnant. If you drink alcohol: Limit how much you have to 0-1 drink a day. Know how much alcohol is in your drink. In the [place] one drink equals one 12 oz bottle of beer (355 mL), one 5 oz glass of wine (148 mL), or one 1 oz glass of hard liquor (44 mL). Lifestyle Brush your teeth every morning and night with fluoride toothpaste. Floss one time each day. Exercise for at least 30 minutes 5 or more days each week. Do not use any products that contain nicotine or tobacco. These products include cigarettes, chewing tobacco, and vaping devices, such as e-cigarettes. If you need help quitting, ask your health care provider. Do not use drugs. If you are sexually active, practice safe sex. Use a condom or other form of protection to prevent STIs. If you do not wish to become pregnant, use a form of birth control. If you plan to become pregnant, see your health care provider for a prepregnancy visit. Take aspirin only as told by your health care provider. Make sure that you understand how much to take and what form to take. Work with your health care provider to find out whether it is safe and beneficial for you to take aspirin daily. Find healthy ways to manage stress, such as: Meditation, yoga, or listening to music. Journaling. Talking to a trusted person. Spending time with friends and family. Minimize exposure to UV radiation to reduce your risk of skin cancer. Safety Always wear  your seat belt while driving or riding in a vehicle. Do not drive: If you have been drinking alcohol. Do not ride with someone who has been drinking. When you are tired or distracted. While texting. If you have been using any mind-altering substances or drugs. Wear a helmet and other protective equipment during sports activities. If you have firearms in your house, make sure you follow all gun safety procedures. Seek help if you have been physically or sexually abused. What's next? Visit your health care provider once a year for an annual wellness visit. Ask your health care provider how often you should have your eyes and teeth checked. Stay up to date on all vaccines. This information is not intended to replace advice given to you by your health care provider. Make sure you discuss any questions you have with your health care provider. Document Revised: 05/13/2021 Document Reviewed: 05/13/2021 Elsevier Patient Education  2024 [employer].  Understanding Your Risk for Falls Millions of people have serious injuries from falls each year. It is important to understand your risk of falling. Talk with your health care provider about your risk and what you can do to lower it. If you do have a serious fall, make sure to tell your provider. Falling once raises your risk of falling again. How can falls affect me? Serious  injuries from falls are common. These include: Broken bones, such as hip fractures. Head injuries, such as traumatic brain injuries (TBI) or concussions. A fear of falling can cause you to avoid activities and stay at home. This can make your muscles weaker and raise your risk for a fall. What can increase my risk? There are a number of risk factors that increase your risk for falling. The more risk factors you have, the higher your risk of falling. Serious injuries from a fall happen most often to people who are older than 63 years old. Teenagers and young adults ages 30-29 are  also at higher risk. Common risk factors include: Weakness in the lower body. Being generally weak or confused due to long-term (chronic) illness. Dizziness or balance problems. Poor vision. Medicines that cause dizziness or drowsiness. These may include: Medicines for your blood pressure, heart, anxiety, insomnia, or swelling (edema). Pain medicines. Muscle relaxants. Other risk factors include: Drinking alcohol. Having had a fall in the past. Having foot pain or wearing improper footwear. Working at a dangerous job. Having any of the following in your home: Tripping hazards, such as floor clutter or loose rugs. Poor lighting. Pets. Having dementia or memory loss. What actions can I take to lower my risk of falling?     Physical activity Stay physically fit. Do strength and balance exercises. Consider taking a regular class to build strength and balance. Yoga and tai chi are good options. Vision Have your eyes checked every year and your prescription for glasses or contacts updated as needed. Shoes and walking aids Wear non-skid shoes. Wear shoes that have rubber soles and low heels. Do not wear high heels. Do not walk around the house in socks or slippers. Use a cane or walker as told by your provider. Home safety Attach secure railings on both sides of your stairs. Install grab bars for your bathtub, shower, and toilet. Use a non-skid mat in your bathtub or shower. Attach bath mats securely with double-sided, non-slip rug tape. Use good lighting in all rooms. Keep a flashlight near your bed. Make sure there is a clear path from your bed to the bathroom. Use night-lights. Do not use throw rugs. Make sure all carpeting is taped or tacked down securely. Remove all clutter from walkways and stairways, including extension cords. Repair uneven or broken steps and floors. Avoid walking on icy or slippery surfaces. Walk on the grass instead of on icy or slick sidewalks. Use ice  melter to get rid of ice on walkways in the winter. Use a cordless phone. Questions to ask your health care provider Can you help me check my risk for a fall? Do any of my medicines make me more likely to fall? Should I take a vitamin D supplement? What exercises can I do to improve my strength and balance? Should I make an appointment to have my vision checked? Do I need a bone density test to check for weak bones (osteoporosis)? Would it help to use a cane or a walker? Where to find more information Centers for Disease Control and Prevention, [employer]: TonerPromos.no Community-Based Fall Prevention Programs: TonerPromos.no [employer] on Aging: BaseRingTones.pl Contact a health care provider if: You fall at home. You are afraid of falling at home. You feel weak, drowsy, or dizzy. This information is not intended to replace advice given to you by your health care provider. Make sure you discuss any questions you have with your health care provider. Document Revised: 07/19/2022 Document Reviewed:  07/19/2022 Elsevier Patient Education  2024 [employer].

## 2023-12-26 NOTE — Progress Notes (Signed)
Because this visit was a virtual/telehealth visit,  certain criteria was not obtained, such a blood pressure, CBG if applicable, and timed get up and go. Any medications not marked as "taking" were not mentioned during the medication reconciliation part of the visit. Any vitals not documented were not able to be obtained due to this being a telehealth visit or patient was unable to self-report a recent blood pressure reading due to a lack of equipment at home via telehealth. Vitals that have been documented are verbally provided by the patient.  Interactive audio and video telecommunications were attempted between this provider and patient, however failed, due to patient having technical difficulties OR patient did not have access to video capability.  We continued and completed visit with audio only.  Subjective:   Leslie Lyons is a 63 y.o. female who presents for Medicare Annual (Subsequent) preventive examination.  Visit Complete: Virtual I connected with  Leslie Lyons on 12/26/23 by a audio enabled telemedicine application and verified that I am speaking with the correct person using two identifiers.  Patient Location: Home  Provider Location: Home Office  I discussed the limitations of evaluation and management by telemedicine. The patient expressed understanding and agreed to proceed.  Vital Signs: Because this visit was a virtual/telehealth visit, some criteria may be missing or patient reported. Any vitals not documented were not able to be obtained and vitals that have been documented are patient reported.  Patient Medicare AWV questionnaire was completed by the patient on na; I have confirmed that all information answered by patient is correct and no changes since this date.  Cardiac Risk Factors include: diabetes mellitus;dyslipidemia;hypertension;obesity (BMI >30kg/m2);sedentary lifestyle     Objective:    Today's Vitals   12/26/23 1043  Weight: 244 lb (110.7 kg)   Height: 5\' 3"  (1.6 m)   Body mass index is 43.22 kg/m.     12/26/2023   10:42 AM 11/21/2023   12:25 PM 11/08/2023    7:51 AM 11/07/2023    3:54 PM 11/15/2022   10:02 AM 10/08/2022   10:02 AM 10/06/2022    1:45 PM  Advanced Directives  Does Patient Have a Medical Advance Directive? No No  No No No No  Would patient like information on creating a medical advance directive? No - Patient declined No - Patient declined Yes (Inpatient - patient defers creating a medical advance directive at this time - Information given)  No - Patient declined No - Patient declined No - Patient declined    Current Medications (verified) Outpatient Encounter Medications as of 12/26/2023  Medication Sig   albuterol (VENTOLIN HFA) 108 (90 Base) MCG/ACT inhaler INHALE 2 PUFFS BY MOUTH EVERY 6 HOURS AS NEEDED   benzonatate (TESSALON) 100 MG capsule Take 1 capsule (100 mg total) by mouth 2 (two) times daily as needed for cough.   blood glucose meter kit and supplies Dispense based on patient and insurance preference. Test blood sugar once daily before breakfast. (FOR ICD-10 E10.9, E11.9).   Blood Glucose Monitoring Suppl (ONETOUCH VERIO FLEX SYSTEM) w/Device KIT Use to monitor glucose twice daily   cholecalciferol (VITAMIN D3) 25 MCG (1000 UNIT) tablet Take 1,000 Units by mouth daily.   glucose blood (ONETOUCH VERIO) test strip USE 1 STRIP TO CHECK GLUCOSE TWICE DAILY   insulin degludec (TRESIBA FLEXTOUCH) 100 UNIT/ML FlexTouch Pen Inject 30 Units into the skin at bedtime.   Insulin Pen Needle (BD PEN NEEDLE NANO 2ND GEN) 32G X 4 MM MISC USE TO  INJECT INSULIN ONCE DAILY   Lancets (ONETOUCH ULTRASOFT) lancets Use as instructed to monitor glucose twice daily   loperamide (IMODIUM A-D) 2 MG tablet Take 1 tablet (2 mg total) by mouth 4 (four) times daily as needed for diarrhea or loose stools.   losartan (COZAAR) 100 MG tablet Take 1 tablet by mouth once daily   mirabegron ER (MYRBETRIQ) 50 MG TB24 tablet Take 1  tablet (50 mg total) by mouth daily.   mometasone-formoterol (DULERA) 200-5 MCG/ACT AERO Inhale 2 puffs into the lungs 2 (two) times daily.   montelukast (SINGULAIR) 10 MG tablet Take 1 tablet (10 mg total) by mouth at bedtime.   MYRBETRIQ 50 MG TB24 tablet Take 1 tablet (50 mg total) by mouth daily.   omega-3 acid ethyl esters (LOVAZA) 1 g capsule Take 2 capsules (2 g total) by mouth 2 (two) times daily.   promethazine-dextromethorphan (PROMETHAZINE-DM) 6.25-15 MG/5ML syrup One teaspoon twice daily, as needed, for excess cough   Semaglutide (OZEMPIC, 1 MG/DOSE, [place]) Inject 1 mg into the skin once a week.   spironolactone (ALDACTONE) 25 MG tablet Take 1 tablet by mouth once daily   traZODone (DESYREL) 100 MG tablet Take 1 tablet (100 mg total) by mouth at bedtime.   rosuvastatin (CRESTOR) 40 MG tablet Take 1 tablet (40 mg total) by mouth at bedtime.   No facility-administered encounter medications on file as of 12/26/2023.    Allergies (verified) Janumet [sitagliptin-metformin hcl] and Robaxin [methocarbamol]   History: Past Medical History:  Diagnosis Date   Abnormal transaminases    Anxiety    Bladder incontinence    Bronchial asthma    with acute exacerbation   Cancer (HCC) 1980   cervical, diag  at age 69   Depression    Diabetes (HCC)    Diabetes mellitus, type II (HCC)    Headache(784.0)    with reduced vision in right eye for one month    Hyperlipidemia    Hypertension    Obesity    Past Surgical History:  Procedure Laterality Date   ABDOMINAL HYSTERECTOMY     COLONOSCOPY WITH ESOPHAGOGASTRODUODENOSCOPY (EGD)  11/2008   Dr. Lovell Sheehan: Conscious sedation.  Hiatal hernia.  Hemorrhoids. PATIENT REPORTS BEING AWAKE DURING PROCEDURE   COLONOSCOPY WITH PROPOFOL N/A 10/08/2022   Procedure: COLONOSCOPY WITH PROPOFOL;  Surgeon: Lanelle Bal, DO;  Location: [facility];  Service: Endoscopy;  Laterality: N/A;  7:30 AM   CRYOTHERAPY     for abnormal pap   HEMORRHOID SURGERY      dr. Elpidio Anis   PARTIAL HYSTERECTOMY     POLYPECTOMY  10/08/2022   Procedure: POLYPECTOMY;  Surgeon: Lanelle Bal, DO;  Location: [facility];  Service: Endoscopy;;   Family History  Problem Relation Age of Onset   Cancer Mother        lung    COPD Father        emphysema   Hypertension Father    Cancer Maternal Aunt        breast   Cancer Paternal Uncle    Colon cancer Neg Hx    Social History   Socioeconomic History   Marital status: Married    Spouse name: Not on file   Number of children: 2   Years of education: Not on file   Highest education level: Not on file  Occupational History   Occupation: homemaker   Tobacco Use   Smoking status: Former    Current packs/day: 0.00  Average packs/day: 0.3 packs/day for 2.0 years (0.5 ttl pk-yrs)    Types: Cigarettes    Start date: 12/03/2013    Quit date: 12/04/2015    Years since quitting: 8.0   Smokeless tobacco: Never  Vaping Use   Vaping status: Never Used  Substance and Sexual Activity   Alcohol use: No   Drug use: No   Sexual activity: Yes  Other Topics Concern   Not on file  Social History Narrative   Lives at home with husband    Social Drivers of Corporate investment banker Strain: Low Risk  (12/26/2023)   Overall Financial Resource Strain (CARDIA)    Difficulty of Paying Living Expenses: Not hard at all  Food Insecurity: No Food Insecurity (12/26/2023)   Hunger Vital Sign    Worried About Running Out of Food in the Last Year: Never true    Ran Out of Food in the Last Year: Never true  Transportation Needs: No Transportation Needs (12/26/2023)   PRAPARE - Administrator, Civil Service (Medical): No    Lack of Transportation (Non-Medical): No  Physical Activity: Sufficiently Active (12/26/2023)   Exercise Vital Sign    Days of Exercise per Week: 7 days    Minutes of Exercise per Session: 30 min  Stress: No Stress Concern Present (12/26/2023)   [employer] of Occupational  Health - Occupational Stress Questionnaire    Feeling of Stress : Not at all  Social Connections: Moderately Integrated (12/26/2023)   Social Connection and Isolation Panel [NHANES]    Frequency of Communication with Friends and Family: More than three times a week    Frequency of Social Gatherings with Friends and Family: More than three times a week    Attends Religious Services: More than 4 times per year    Active Member of [employer] or Organizations: No    Attends Engineer, structural: Never    Marital Status: Married    Tobacco Counseling Counseling given: Yes   Clinical Intake:  Pre-visit preparation completed: Yes  Pain : No/denies pain     BMI - recorded: 43.22 Nutritional Status: BMI > 30  Obese Nutritional Risks: None Diabetes: Yes CBG done?: No Did pt. bring in CBG monitor from home?: No  How often do you need to have someone help you when you read instructions, pamphlets, or other written materials from your doctor or pharmacy?: 1 - Never  Interpreter Needed?: No  Information entered by :: Maryjean Ka CMA   Activities of Daily Living    12/26/2023   10:51 AM 11/08/2023    7:42 AM  In your present state of health, do you have any difficulty performing the following activities:  Hearing? 0   Vision? 0   Difficulty concentrating or making decisions? 0   Walking or climbing stairs? 1   Comment depends on how many there are.   Dressing or bathing? 0   Doing errands, shopping? 0 0  Preparing Food and eating ? N   Using the Toilet? N   In the past six months, have you accidently leaked urine? N   Do you have problems with loss of bowel control? N   Managing your Medications? N   Managing your Finances? N   Housekeeping or managing your Housekeeping? N     Patient Care Team: [name], MD as PCP - General (Family Medicine) [name], Orion Modest, MD as PCP - Cardiology (Cardiology) Daisy Lazar, DO (Optometry) Lanelle Bal,  DO as  Consulting Physician (Internal Medicine)  Indicate any recent Medical Services you may have received from other than [facility] providers in the past year (date may be approximate).     Assessment:   This is a routine wellness examination for Leslie Lyons.  Hearing/Vision screen Hearing Screening - Comments:: Patient denies any hearing difficulties.   Vision Screening - Comments:: Wears rx glasses - up to date with routine eye exams  Patient sees Dr. Daisy Lazar w/ [facility] office.  She is scheduled for a follow up already for next year.     Goals Addressed             This Visit's Progress    Patient Stated       I want to be the best me I can be. I want to exercise more, eat healthier, and be more active.        Depression Screen    12/26/2023   10:53 AM 12/01/2023    1:49 PM 11/16/2023    1:07 PM 10/12/2023    9:11 AM 07/05/2023    3:36 PM 03/15/2023    3:30 PM 02/04/2023    3:42 PM  PHQ 2/9 Scores  PHQ - 2 Score 0 0 0 0 1 0 1  PHQ- 9 Score 0     1 4    Fall Risk    12/26/2023   10:50 AM 12/01/2023    1:49 PM 11/16/2023    1:07 PM 10/12/2023    9:11 AM 07/05/2023    3:35 PM  Fall Risk   Falls in the past year? 0 0 0 0 0  Number falls in past yr: 0 0 0 0 0  Injury with Fall? 0 0 0 0 0  Risk for fall due to : No Fall Risks;History of fall(s);Impaired balance/gait;Impaired mobility No Fall Risks No Fall Risks No Fall Risks No Fall Risks  Follow up Falls prevention discussed;Education provided Falls evaluation completed Falls evaluation completed Falls evaluation completed Falls evaluation completed    MEDICARE RISK AT HOME: Medicare Risk at Home Any stairs in or around the home?: No If so, are there any without handrails?: No Home free of loose throw rugs in walkways, pet beds, electrical cords, etc?: Yes Adequate lighting in your home to reduce risk of falls?: Yes Life alert?: No Use of a cane, walker or w/c?: Yes (as needed) Grab bars in the bathroom?:  No Shower chair or bench in shower?: Yes Elevated toilet seat or a handicapped toilet?: No  TIMED UP AND GO:  Was the test performed?  No    Cognitive Function:    11/15/2022   10:04 AM 11/12/2020    3:46 PM  MMSE - Mini Mental State Exam  Not completed: Unable to complete Unable to complete        12/26/2023   10:47 AM 11/15/2022   10:06 AM 11/15/2022   10:04 AM 11/13/2021    9:49 AM 11/12/2020    3:46 PM  6CIT Screen  What Year? 0 points  0 points 0 points 0 points  What month? 0 points   0 points 0 points  What time? 0 points  0 points 0 points 0 points  Count back from 20 0 points  0 points 0 points 0 points  Months in reverse 0 points 4 points 0 points 4 points 4 points  Repeat phrase 0 points  10 points 0 points 10 points  Total Score 0 points  4 points 14 points    Immunizations Immunization History  Administered Date(s) Administered   Influenza Whole 09/25/2007, 11/04/2009, 09/22/2010   Influenza, Seasonal, Injecte, Preservative Fre 10/12/2023   Influenza,inj,Quad PF,6+ Mos 08/09/2013, 07/22/2014, 11/17/2015, 01/04/2018, 10/03/2018, 08/28/2019, 08/05/2020, 09/15/2022   Janssen (J&J) SARS-COV-2 Vaccination 05/20/2020   PNEUMOCOCCAL CONJUGATE-20 10/12/2023   Pneumococcal Conjugate-13 04/25/2018   Pneumococcal Polysaccharide-23 07/31/2009, 07/05/2016   Td 03/26/2009   Zoster Recombinant(Shingrix) 09/10/2023    TDAP status: Due, Education has been provided regarding the importance of this vaccine. Advised may receive this vaccine at local pharmacy or Health Dept. Aware to provide a copy of the vaccination record if obtained from local pharmacy or Health Dept. Verbalized acceptance and understanding.  Flu Vaccine status: Up to date  Pneumococcal vaccine status: Up to date  Covid-19 vaccine status: Information provided on how to obtain vaccines.   Qualifies for Shingles Vaccine? Yes   Zostavax completed No   Shingrix Completed?: No.    Education has been  provided regarding the importance of this vaccine. Patient has been advised to call insurance company to determine out of pocket expense if they have not yet received this vaccine. Advised may also receive vaccine at local pharmacy or Health Dept. Verbalized acceptance and understanding.  Screening Tests Health Maintenance  Topic Date Due   DTaP/Tdap/Td (2 - Tdap) 03/27/2019   COVID-19 Vaccine (2 - 2024-25 season) 07/31/2023   Zoster Vaccines- Shingrix (2 of 2) 11/05/2023   Medicare Annual Wellness (AWV)  02/10/2024   HEMOGLOBIN A1C  03/07/2024   OPHTHALMOLOGY EXAM  04/13/2024   Diabetic kidney evaluation - Urine ACR  07/04/2024   FOOT EXAM  10/23/2024   Diabetic kidney evaluation - eGFR measurement  11/20/2024   MAMMOGRAM  05/03/2025   Cervical Cancer Screening (HPV/Pap Cotest)  02/04/2028   Colonoscopy  10/08/2032   Pneumococcal Vaccine 14-22 Years old  Completed   INFLUENZA VACCINE  Completed   Hepatitis C Screening  Completed   HIV Screening  Completed   HPV VACCINES  Aged Out    Health Maintenance  Health Maintenance Due  Topic Date Due   DTaP/Tdap/Td (2 - Tdap) 03/27/2019   COVID-19 Vaccine (2 - 2024-25 season) 07/31/2023   Zoster Vaccines- Shingrix (2 of 2) 11/05/2023   Medicare Annual Wellness (AWV)  02/10/2024    Colorectal cancer screening: Type of screening: Colonoscopy. Completed 10/08/2022. Repeat every 5 years  Mammogram status: Completed 05/04/2023. Repeat every year  Bone Density Screening: Not age appropriate for this patient.   Lung Cancer Screening: (Low Dose CT Chest recommended if Age 67-80 years, 20 pack-year currently smoking OR have quit w/in 15years.) does not qualify.   Lung Cancer Screening Referral: na  Additional Screening:  Hepatitis C Screening: does not qualify; Completed   Vision Screening: Recommended annual ophthalmology exams for early detection of glaucoma and other disorders of the eye. Is the patient up to date with their annual  eye exam?  Yes  Who is the provider or what is the name of the office in which the patient attends annual eye exams? Patient sees Dr. Daisy Lazar w/ [facility] [place] office.   If pt is not established with a provider, would they like to be referred to a provider to establish care? No .   Dental Screening: Recommended annual dental exams for proper oral hygiene  Diabetic Foot Exam: Diabetic Foot Exam: Completed 10/24/2023  Community Resource Referral / Chronic Care Management: CRR required this visit?  No   CCM  required this visit?  No     Plan:     I have personally reviewed and noted the following in the patient's chart:   Medical and social history Use of alcohol, tobacco or illicit drugs  Current medications and supplements including opioid prescriptions. Patient is not currently taking opioid prescriptions. Functional ability and status Nutritional status Physical activity Advanced directives List of other physicians Hospitalizations, surgeries, and ER visits in previous 12 months Vitals Screenings to include cognitive, depression, and falls Referrals and appointments  In addition, I have reviewed and discussed with patient certain preventive protocols, quality metrics, and best practice recommendations. A written personalized care plan for preventive services as well as general preventive health recommendations were provided to patient.     Jordan Hawks Azizah Lisle, CMA   12/26/2023   After Visit Summary: (MyChart) Due to this being a telephonic visit, the after visit summary with patients personalized plan was offered to patient via MyChart   Nurse Notes: na

## 2024-01-05 ENCOUNTER — Telehealth: Payer: Self-pay | Admitting: Nurse Practitioner

## 2024-01-05 NOTE — Telephone Encounter (Signed)
 Pts Novo Nordisk Re enrollment has been approved for 2025. Have not received any shipments yet.

## 2024-01-09 ENCOUNTER — Ambulatory Visit: Payer: Medicare Other | Admitting: Nurse Practitioner

## 2024-01-09 DIAGNOSIS — Z7984 Long term (current) use of oral hypoglycemic drugs: Secondary | ICD-10-CM

## 2024-01-09 DIAGNOSIS — I10 Essential (primary) hypertension: Secondary | ICD-10-CM

## 2024-01-09 DIAGNOSIS — E1122 Type 2 diabetes mellitus with diabetic chronic kidney disease: Secondary | ICD-10-CM

## 2024-01-09 DIAGNOSIS — Z794 Long term (current) use of insulin: Secondary | ICD-10-CM

## 2024-01-09 DIAGNOSIS — E782 Mixed hyperlipidemia: Secondary | ICD-10-CM

## 2024-01-09 DIAGNOSIS — Z7985 Long-term (current) use of injectable non-insulin antidiabetic drugs: Secondary | ICD-10-CM

## 2024-01-10 ENCOUNTER — Telehealth: Payer: Self-pay | Admitting: Nurse Practitioner

## 2024-01-10 NOTE — Telephone Encounter (Signed)
Notified pt that Ozempic from patient assistance is here but have not received [place] yet.

## 2024-01-26 ENCOUNTER — Other Ambulatory Visit: Payer: Self-pay | Admitting: *Deleted

## 2024-01-26 DIAGNOSIS — E1165 Type 2 diabetes mellitus with hyperglycemia: Secondary | ICD-10-CM

## 2024-01-26 DIAGNOSIS — Z7985 Long-term (current) use of injectable non-insulin antidiabetic drugs: Secondary | ICD-10-CM

## 2024-01-26 DIAGNOSIS — E1122 Type 2 diabetes mellitus with diabetic chronic kidney disease: Secondary | ICD-10-CM

## 2024-01-26 DIAGNOSIS — Z794 Long term (current) use of insulin: Secondary | ICD-10-CM

## 2024-01-26 DIAGNOSIS — Z7984 Long term (current) use of oral hypoglycemic drugs: Secondary | ICD-10-CM

## 2024-01-26 MED ORDER — ONETOUCH VERIO VI STRP: ORAL_STRIP | 1 refills | Status: DC

## 2024-02-06 ENCOUNTER — Other Ambulatory Visit: Payer: Self-pay | Admitting: Family Medicine

## 2024-02-06 DIAGNOSIS — F5104 Psychophysiologic insomnia: Secondary | ICD-10-CM

## 2024-02-09 ENCOUNTER — Telehealth: Payer: Self-pay

## 2024-02-09 NOTE — Telephone Encounter (Signed)
 Patient made aware to pick up her ozempic from [facility]

## 2024-02-09 NOTE — Telephone Encounter (Signed)
 Pt's spouse picked up the meds and pen needles

## 2024-02-10 ENCOUNTER — Encounter: Payer: Medicare Other | Admitting: Family Medicine

## 2024-02-10 ENCOUNTER — Telehealth: Payer: Self-pay

## 2024-02-10 NOTE — Telephone Encounter (Signed)
 Copied from CRM [id number]. Topic: General - Other >> Feb 10, 2024  8:19 AM Leslie Lyons wrote: Reason for CRM: Patient was scheduled for her physical today; however, called to advise that she was not going to be able to make it due to an earache. She wanted to let Dr.Simpson know that the earache is causing her to experience headaches.

## 2024-02-13 NOTE — Telephone Encounter (Signed)
 Contacted pt to inform of physician advise . No answer. Lvm.

## 2024-02-13 NOTE — Progress Notes (Deleted)
 GI Office Note    Referring Provider: [name], MD Primary Care Physician:  [name], MD  Primary Gastroenterologist: Hennie Duos. Marletta Lor, DO   Chief Complaint   No chief complaint on file.   History of Present Illness   Leslie Lyons is a 63 y.o. female presenting today for follow up. Last seen 10/2023. History of mild colitis by CT, negative GIP, Cdiff screen while in ED 10/2023. Also with MASH with low median KPa by elastography, NAFLD score with risk for F3-4. Here for follow up***.  CT A/P with contrast 11/21/23: IMPRESSION: 1. Persistent short-segment mild mural thickening of the ascending colon, which may be related to underdistention or resolving colitis. 2.  Aortic Atherosclerosis (ICD10-I70.0).   CT A/P with contrast 11/07/23: IMPRESSION: Mild apparent wall thickening within the right colon suggesting colitis. Recommend clinical correlation.   Abdominal ultrasound with elastography September 2023: IMPRESSION: ULTRASOUND RUQ: Unremarkable ULTRASOUND HEPATIC ELASTOGRAPHY: Median kPa:  3.9 Diagnostic category:  < or = 5 kPa: high probability of being normal   Colonoscopy October 2023: -Hemorrhoids found on perianal exam -Nonbleeding internal hemorrhoids -Diverticulosis in the sigmoid colon -three to 7 mm polyps in the sigmoid colon, descending colon, transverse colon -5-year surveillance colonoscopy        Medications   Current Outpatient Medications  Medication Sig Dispense Refill   albuterol (VENTOLIN HFA) 108 (90 Base) MCG/ACT inhaler INHALE 2 PUFFS BY MOUTH EVERY 6 HOURS AS NEEDED 18 g 0   benzonatate (TESSALON) 100 MG capsule Take 1 capsule (100 mg total) by mouth 2 (two) times daily as needed for cough. 20 capsule 0   blood glucose meter kit and supplies Dispense based on patient and insurance preference. Test blood sugar once daily before breakfast. (FOR ICD-10 E10.9, E11.9). 1 each 0   Blood Glucose Monitoring Suppl  (ONETOUCH VERIO FLEX SYSTEM) w/Device KIT Use to monitor glucose twice daily 1 kit 0   cholecalciferol (VITAMIN D3) 25 MCG (1000 UNIT) tablet Take 1,000 Units by mouth daily.     glucose blood (ONETOUCH VERIO) test strip USE 1 STRIP TO CHECK GLUCOSE TWICE DAILY 200 each 1   insulin degludec (TRESIBA FLEXTOUCH) 100 UNIT/ML FlexTouch Pen Inject 30 Units into the skin at bedtime. 21 mL 3   Insulin Pen Needle (BD PEN NEEDLE NANO 2ND GEN) 32G X 4 MM MISC USE TO INJECT INSULIN ONCE DAILY 100 each 3   Lancets (ONETOUCH ULTRASOFT) lancets Use as instructed to monitor glucose twice daily 100 each 12   loperamide (IMODIUM A-D) 2 MG tablet Take 1 tablet (2 mg total) by mouth 4 (four) times daily as needed for diarrhea or loose stools.     losartan (COZAAR) 100 MG tablet Take 1 tablet by mouth once daily 90 tablet 0   mirabegron ER (MYRBETRIQ) 50 MG TB24 tablet Take 1 tablet (50 mg total) by mouth daily. 30 tablet 5   mometasone-formoterol (DULERA) 200-5 MCG/ACT AERO Inhale 2 puffs into the lungs 2 (two) times daily. 13 g 2   montelukast (SINGULAIR) 10 MG tablet Take 1 tablet (10 mg total) by mouth at bedtime. 30 tablet 3   MYRBETRIQ 50 MG TB24 tablet Take 1 tablet (50 mg total) by mouth daily. 30 tablet 5   omega-3 acid ethyl esters (LOVAZA) 1 g capsule Take 2 capsules (2 g total) by mouth 2 (two) times daily. 60 capsule 11   promethazine-dextromethorphan (PROMETHAZINE-DM) 6.25-15 MG/5ML syrup One teaspoon twice daily, as needed, for excess  cough 118 mL 0   rosuvastatin (CRESTOR) 40 MG tablet Take 1 tablet (40 mg total) by mouth at bedtime. 90 tablet 3   Semaglutide (OZEMPIC, 1 MG/DOSE, [place]) Inject 1 mg into the skin once a week.     spironolactone (ALDACTONE) 25 MG tablet Take 1 tablet by mouth once daily 30 tablet 0   traZODone (DESYREL) 100 MG tablet TAKE 1 TABLET BY MOUTH AT BEDTIME 30 tablet 0   No current facility-administered medications for this visit.    Allergies   Allergies as of 02/14/2024 -  Review Complete 12/26/2023  Allergen Reaction Noted   Janumet [sitagliptin-metformin hcl] Nausea And Vomiting 10/03/2018   Robaxin [methocarbamol] Swelling 07/31/2013     Past Medical History   Past Medical History:  Diagnosis Date   Abnormal transaminases    Anxiety    Bladder incontinence    Bronchial asthma    with acute exacerbation   Cancer (HCC) 1980   cervical, diag  at age 40   Depression    Diabetes (HCC)    Diabetes mellitus, type II (HCC)    Headache(784.0)    with reduced vision in right eye for one month    Hyperlipidemia    Hypertension    Obesity     Past Surgical History   Past Surgical History:  Procedure Laterality Date   ABDOMINAL HYSTERECTOMY     COLONOSCOPY WITH ESOPHAGOGASTRODUODENOSCOPY (EGD)  11/2008   Dr. Lovell Sheehan: Conscious sedation.  Hiatal hernia.  Hemorrhoids. PATIENT REPORTS BEING AWAKE DURING PROCEDURE   COLONOSCOPY WITH PROPOFOL N/A 10/08/2022   Procedure: COLONOSCOPY WITH PROPOFOL;  Surgeon: Lanelle Bal, DO;  Location: [facility];  Service: Endoscopy;  Laterality: N/A;  7:30 AM   CRYOTHERAPY     for abnormal pap   HEMORRHOID SURGERY     dr. Elpidio Anis   PARTIAL HYSTERECTOMY     POLYPECTOMY  10/08/2022   Procedure: POLYPECTOMY;  Surgeon: Lanelle Bal, DO;  Location: [facility];  Service: Endoscopy;;    Past Family History   Family History  Problem Relation Age of Onset   Cancer Mother        lung    COPD Father        emphysema   Hypertension Father    Cancer Maternal Aunt        breast   Cancer Paternal Uncle    Colon cancer Neg Hx     Past Social History   Social History   Socioeconomic History   Marital status: Married    Spouse name: Not on file   Number of children: 2   Years of education: Not on file   Highest education level: Not on file  Occupational History   Occupation: homemaker   Tobacco Use   Smoking status: Former    Current packs/day: 0.00    Average packs/day: 0.3 packs/day for  2.0 years (0.5 ttl pk-yrs)    Types: Cigarettes    Start date: 12/03/2013    Quit date: 12/04/2015    Years since quitting: 8.2   Smokeless tobacco: Never  Vaping Use   Vaping status: Never Used  Substance and Sexual Activity   Alcohol use: No   Drug use: No   Sexual activity: Yes  Other Topics Concern   Not on file  Social History Narrative   Lives at home with husband    Social Drivers of Health   Financial Resource Strain: Low Risk  (12/26/2023)   Overall Financial  Resource Strain (CARDIA)    Difficulty of Paying Living Expenses: Not hard at all  Food Insecurity: No Food Insecurity (12/26/2023)   Hunger Vital Sign    Worried About Running Out of Food in the Last Year: Never true    Ran Out of Food in the Last Year: Never true  Transportation Needs: No Transportation Needs (12/26/2023)   PRAPARE - Administrator, Civil Service (Medical): No    Lack of Transportation (Non-Medical): No  Physical Activity: Sufficiently Active (12/26/2023)   Exercise Vital Sign    Days of Exercise per Week: 7 days    Minutes of Exercise per Session: 30 min  Stress: No Stress Concern Present (12/26/2023)   [employer] of Occupational Health - Occupational Stress Questionnaire    Feeling of Stress : Not at all  Social Connections: Moderately Integrated (12/26/2023)   Social Connection and Isolation Panel [NHANES]    Frequency of Communication with Friends and Family: More than three times a week    Frequency of Social Gatherings with Friends and Family: More than three times a week    Attends Religious Services: More than 4 times per year    Active Member of [employer] or Organizations: No    Attends Banker Meetings: Never    Marital Status: Married  Catering manager Violence: Not At Risk (12/26/2023)   Humiliation, Afraid, Rape, and Kick questionnaire    Fear of Current or Ex-Partner: No    Emotionally Abused: No    Physically Abused: No    Sexually Abused: No     Review of Systems   General: Negative for anorexia, weight loss, fever, chills, fatigue, weakness. ENT: Negative for hoarseness, difficulty swallowing , nasal congestion. CV: Negative for chest pain, angina, palpitations, dyspnea on exertion, peripheral edema.  Respiratory: Negative for dyspnea at rest, dyspnea on exertion, cough, sputum, wheezing.  GI: See history of present illness. GU:  Negative for dysuria, hematuria, urinary incontinence, urinary frequency, nocturnal urination.  Endo: Negative for unusual weight change.     Physical Exam   There were no vitals taken for this visit.   General: Well-nourished, well-developed in no acute distress.  Eyes: No icterus. Mouth: Oropharyngeal mucosa moist and pink , no lesions erythema or exudate. Lungs: Clear to auscultation bilaterally.  Heart: Regular rate and rhythm, no murmurs rubs or gallops.  Abdomen: Bowel sounds are normal, nontender, nondistended, no hepatosplenomegaly or masses,  no abdominal bruits or hernia , no rebound or guarding.  Rectal: ***  Extremities: No lower extremity edema. No clubbing or deformities. Neuro: Alert and oriented x 4   Skin: Warm and dry, no jaundice.   Psych: Alert and cooperative, normal mood and affect.  Labs   Lab Results  Component Value Date   NA 137 11/21/2023   CL 99 11/21/2023   K 3.9 11/21/2023   CO2 28 11/21/2023   BUN 15 11/21/2023   CREATININE 1.12 (H) 11/21/2023   GFRNONAA 56 (L) 11/21/2023   CALCIUM 9.1 11/21/2023   PHOS 2.7 11/08/2023   ALBUMIN 4.2 11/21/2023   GLUCOSE 141 (H) 11/21/2023   Lab Results  Component Value Date   ALT 28 11/21/2023   AST 28 11/21/2023   ALKPHOS 54 11/21/2023   BILITOT 0.3 11/21/2023   Lab Results  Component Value Date   WBC 9.6 11/21/2023   HGB 11.8 (L) 11/21/2023   HCT 38.1 11/21/2023   MCV 86.8 11/21/2023   PLT 177 11/21/2023   Lab Results  Component Value Date   CREATININE 1.12 (H) 11/21/2023   Lab Results  Component  Value Date   ESRSEDRATE 18 11/08/2023     Imaging Studies   No results found.  Assessment       PLAN   ***   Leanna Battles. Melvyn Neth, MHS, PA-C [facility]

## 2024-02-14 ENCOUNTER — Ambulatory Visit: Payer: Medicare Other | Admitting: Gastroenterology

## 2024-02-14 ENCOUNTER — Telehealth: Payer: Self-pay | Admitting: Gastroenterology

## 2024-02-14 NOTE — Telephone Encounter (Signed)
 Patient missed her appointment today because she overslept.  She left a message saying she wanted to reschedule.  I called her back but had to leave a message

## 2024-02-23 ENCOUNTER — Ambulatory Visit: Payer: Medicare Other | Admitting: Nurse Practitioner

## 2024-02-23 DIAGNOSIS — I10 Essential (primary) hypertension: Secondary | ICD-10-CM

## 2024-02-23 DIAGNOSIS — Z7985 Long-term (current) use of injectable non-insulin antidiabetic drugs: Secondary | ICD-10-CM

## 2024-02-23 DIAGNOSIS — Z794 Long term (current) use of insulin: Secondary | ICD-10-CM

## 2024-02-23 DIAGNOSIS — Z7984 Long term (current) use of oral hypoglycemic drugs: Secondary | ICD-10-CM

## 2024-02-23 DIAGNOSIS — E782 Mixed hyperlipidemia: Secondary | ICD-10-CM

## 2024-02-23 DIAGNOSIS — E1122 Type 2 diabetes mellitus with diabetic chronic kidney disease: Secondary | ICD-10-CM

## 2024-03-10 ENCOUNTER — Other Ambulatory Visit: Payer: Self-pay | Admitting: Family Medicine

## 2024-04-11 ENCOUNTER — Telehealth: Payer: Self-pay | Admitting: Nurse Practitioner

## 2024-04-11 ENCOUNTER — Encounter: Payer: Self-pay | Admitting: Family Medicine

## 2024-04-11 NOTE — Telephone Encounter (Signed)
 Patient was made aware that her Ozempic  is here

## 2024-04-18 ENCOUNTER — Other Ambulatory Visit: Payer: Self-pay | Admitting: Family Medicine

## 2024-04-26 ENCOUNTER — Ambulatory Visit (INDEPENDENT_AMBULATORY_CARE_PROVIDER_SITE_OTHER): Admitting: Family Medicine

## 2024-04-26 ENCOUNTER — Encounter: Payer: Self-pay | Admitting: Family Medicine

## 2024-04-26 VITALS — BP 122/76 | HR 114 | Resp 20 | Ht 63.0 in | Wt 240.0 lb

## 2024-04-26 DIAGNOSIS — Z794 Long term (current) use of insulin: Secondary | ICD-10-CM

## 2024-04-26 DIAGNOSIS — N182 Chronic kidney disease, stage 2 (mild): Secondary | ICD-10-CM

## 2024-04-26 DIAGNOSIS — Z0001 Encounter for general adult medical examination with abnormal findings: Secondary | ICD-10-CM | POA: Diagnosis not present

## 2024-04-26 DIAGNOSIS — Z1231 Encounter for screening mammogram for malignant neoplasm of breast: Secondary | ICD-10-CM

## 2024-04-26 DIAGNOSIS — I10 Essential (primary) hypertension: Secondary | ICD-10-CM

## 2024-04-26 DIAGNOSIS — E1122 Type 2 diabetes mellitus with diabetic chronic kidney disease: Secondary | ICD-10-CM | POA: Diagnosis not present

## 2024-04-26 DIAGNOSIS — H6591 Unspecified nonsuppurative otitis media, right ear: Secondary | ICD-10-CM | POA: Diagnosis not present

## 2024-04-26 DIAGNOSIS — E7800 Pure hypercholesterolemia, unspecified: Secondary | ICD-10-CM

## 2024-04-26 DIAGNOSIS — J209 Acute bronchitis, unspecified: Secondary | ICD-10-CM | POA: Diagnosis not present

## 2024-04-26 DIAGNOSIS — E559 Vitamin D deficiency, unspecified: Secondary | ICD-10-CM | POA: Diagnosis not present

## 2024-04-26 MED ORDER — SULFAMETHOXAZOLE-TRIMETHOPRIM 800-160 MG PO TABS: 1.0000 | ORAL_TABLET | Freq: Two times a day (BID) | ORAL | 0 refills | Status: DC

## 2024-04-26 MED ORDER — BENZONATATE 200 MG PO CAPS: 200.0000 mg | ORAL_CAPSULE | Freq: Two times a day (BID) | ORAL | 0 refills | Status: DC | PRN

## 2024-04-26 MED ORDER — SPIRONOLACTONE 25 MG PO TABS: 25.0000 mg | ORAL_TABLET | Freq: Every day | ORAL | 5 refills | Status: DC

## 2024-04-26 NOTE — Patient Instructions (Addendum)
 F/U in 2 months, re eval blood pressure  Labs hBA1c, cmp and eGFR, lipid panel, cBC, tSH and vit D today  Please schedule mammogram at checkout  Pls schedule your eye exam , referral is entered  Tessalon  perles ansd septra  are prescribed for right ear infection and acute bronchitis  Nurse ps update Tdap and shingrix vaccines states [facility] [place]   It is important that you exercise regularly at least 30 minutes 5 times a week. If you develop chest pain, have severe difficulty breathing, or feel very tired, stop exercising immediately and seek medical attention   Thanks for choosing [facility], we consider it a privelige to serve you.

## 2024-04-26 NOTE — Progress Notes (Signed)
 Leslie Lyons     MRN: [id number]      DOB: Feb 03, 1961  Chief Complaint  Patient presents with   Annual Exam    cpe    HPI: Patient is in for annual physical exam. Immunization is reviewed , and  updated if needed. 2 day h/o cough, chest congestion with yellow sputum and right ear pain, also had chills, no documented fever  PE: BP 122/76   Pulse (!) 114   Resp 20   Ht 5\' 3"  (1.6 m)   Wt 240 lb 0.6 oz (108.9 kg)   SpO2 92%   BMI 42.52 kg/m   Pleasant  female, alert and oriented x 3, in no cardio-pulmonary distress. Afebrile. HEENT No facial trauma or asymetry. Sinuses non tender.  Extra occullar muscles intact.. External ears normal, .R TM erythematous Neck: supple, no adenopathy,JVD or thyromegaly.No bruits.  Chest: Decreased air entry bibasilar crackles and few wheezes Non tender to palpation  Cardiovascular system; Heart sounds normal,  S1 and  S2 ,no S3.  No murmur, or thrill. Apical beat not displaced Peripheral pulses normal.  Abdomen: Soft, non tender,  Musculoskeletal exam: Decreased  ROM of spine, hips , shoulders and knees. No deformity ,swelling or crepitus noted. No muscle wasting or atrophy.   Neurologic: Cranial nerves 2 to 12 intact. Power, tone ,sensation s normal throughout.  disturbance in gait. No tremor.  Skin: Intact, no ulceration, erythema , scaling or rash noted. Pigmentation normal throughout  Psych; Normal mood and affect. Judgement and concentration normal   Assessment & Plan:  Type 2 diabetes mellitus with stage 2 chronic kidney disease, with long-term current use of insulin  (HCC) Diabetes associated with hypertension, hyperlipidemia, obesity, and depression  Ms. Asleson is reminded of the importance of commitment to daily physical activity for 30 minutes or more, as able and the need to limit carbohydrate intake to 30 to 60 grams per meal to help with blood sugar control.   The need to take medication as  prescribed, test blood sugar as directed, and to call between visits if there is a concern that blood sugar is uncontrolled is also discussed.  Uncontrolled, needs to be updated  Ms. Klett is reminded of the importance of daily foot exam, annual eye examination, and good blood sugar, blood pressure and cholesterol control.     Latest Ref Rng & Units 11/21/2023   12:44 PM 11/09/2023    4:17 AM 11/08/2023    5:29 AM 11/07/2023    4:49 PM 09/07/2023    2:58 PM  Diabetic Labs  HbA1c 4.0 - 5.6 %     7.5   Creatinine 0.44 - 1.00 mg/dL 7.82  9.56  2.13  0.86        04/26/2024   11:08 AM 12/26/2023   10:43 AM 12/01/2023    2:49 PM 12/01/2023    1:49 PM 12/01/2023    1:48 PM 11/21/2023    8:00 PM 11/21/2023    6:00 PM  BP/Weight  Systolic BP 148 -- 124 132 147 135 100  Diastolic BP 84 -- 78 86 88 77 67  Wt. (Lbs) 240.04 244   245.12    BMI 42.52 kg/m2 43.22 kg/m2   43.42 kg/m2        Latest Ref Rng & Units 10/12/2023    9:00 AM 04/14/2023   12:00 AM  Foot/eye exam completion dates  Eye Exam No Retinopathy  No Retinopathy      Foot Form Completion  Done      This result is from an external source.        Encounter for Medicare annual examination with abnormal findings Annual exam as documented. Counseling done  re healthy lifestyle involving commitment to 150 minutes exercise per week, heart healthy diet, and attaining healthy weight.The importance of adequate sleep also discussed. Regular seat belt use and home safety, is also discussed. Changes in health habits are decided on by the patient with goals and time frames  set for achieving them. Immunization and cancer screening needs are specifically addressed at this visit.   Acute bronchitis Tessalon  perles and septra  are prescribed  Right otitis media with effusion Septra  is prescribed

## 2024-04-26 NOTE — Assessment & Plan Note (Signed)
 Septra  is prescribed

## 2024-04-26 NOTE — Assessment & Plan Note (Signed)

## 2024-04-26 NOTE — Assessment & Plan Note (Signed)
 Tessalon  perles and septra  are prescribed

## 2024-04-26 NOTE — Assessment & Plan Note (Signed)
 Diabetes associated with hypertension, hyperlipidemia, obesity, and depression  Ms. Dehne is reminded of the importance of commitment to daily physical activity for 30 minutes or more, as able and the need to limit carbohydrate intake to 30 to 60 grams per meal to help with blood sugar control.   The need to take medication as prescribed, test blood sugar as directed, and to call between visits if there is a concern that blood sugar is uncontrolled is also discussed.  Uncontrolled, needs to be updated  Ms. Prouse is reminded of the importance of daily foot exam, annual eye examination, and good blood sugar, blood pressure and cholesterol control.     Latest Ref Rng & Units 11/21/2023   12:44 PM 11/09/2023    4:17 AM 11/08/2023    5:29 AM 11/07/2023    4:49 PM 09/07/2023    2:58 PM  Diabetic Labs  HbA1c 4.0 - 5.6 %     7.5   Creatinine 0.44 - 1.00 mg/dL 1.61  0.96  0.45  4.09        04/26/2024   11:08 AM 12/26/2023   10:43 AM 12/01/2023    2:49 PM 12/01/2023    1:49 PM 12/01/2023    1:48 PM 11/21/2023    8:00 PM 11/21/2023    6:00 PM  BP/Weight  Systolic BP 148 -- 124 132 147 135 100  Diastolic BP 84 -- 78 86 88 77 67  Wt. (Lbs) 240.04 244   245.12    BMI 42.52 kg/m2 43.22 kg/m2   43.42 kg/m2        Latest Ref Rng & Units 10/12/2023    9:00 AM 04/14/2023   12:00 AM  Foot/eye exam completion dates  Eye Exam No Retinopathy  No Retinopathy      Foot Form Completion  Done      This result is from an external source.

## 2024-04-27 ENCOUNTER — Other Ambulatory Visit: Payer: Self-pay

## 2024-04-27 ENCOUNTER — Ambulatory Visit: Payer: Self-pay | Admitting: Family Medicine

## 2024-04-27 DIAGNOSIS — R7989 Other specified abnormal findings of blood chemistry: Secondary | ICD-10-CM

## 2024-04-27 LAB — HEMOGLOBIN A1C
Est. average glucose Bld gHb Est-mCnc: 180 mg/dL
Hgb A1c MFr Bld: 7.9 % — ABNORMAL HIGH (ref 4.8–5.6)

## 2024-04-27 LAB — VITAMIN D 25 HYDROXY (VIT D DEFICIENCY, FRACTURES): Vit D, 25-Hydroxy: 49.1 ng/mL (ref 30.0–100.0)

## 2024-04-27 LAB — CBC WITH DIFFERENTIAL/PLATELET
Basophils Absolute: 0.1 10*3/uL (ref 0.0–0.2)
Basos: 1 %
EOS (ABSOLUTE): 0.3 10*3/uL (ref 0.0–0.4)
Eos: 4 %
Hematocrit: 39.9 % (ref 34.0–46.6)
Hemoglobin: 12.8 g/dL (ref 11.1–15.9)
Immature Grans (Abs): 0 10*3/uL (ref 0.0–0.1)
Immature Granulocytes: 0 %
Lymphocytes Absolute: 3.4 10*3/uL — ABNORMAL HIGH (ref 0.7–3.1)
Lymphs: 43 %
MCH: 27.5 pg (ref 26.6–33.0)
MCHC: 32.1 g/dL (ref 31.5–35.7)
MCV: 86 fL (ref 79–97)
Monocytes Absolute: 0.8 10*3/uL (ref 0.1–0.9)
Monocytes: 10 %
Neutrophils Absolute: 3.2 10*3/uL (ref 1.4–7.0)
Neutrophils: 42 %
Platelets: 201 10*3/uL (ref 150–450)
RBC: 4.65 x10E6/uL (ref 3.77–5.28)
RDW: 13.8 % (ref 11.7–15.4)
WBC: 7.8 10*3/uL (ref 3.4–10.8)

## 2024-04-27 LAB — LIPID PANEL
Chol/HDL Ratio: 2.6 ratio (ref 0.0–4.4)
Cholesterol, Total: 128 mg/dL (ref 100–199)
HDL: 49 mg/dL (ref >39)
LDL Chol Calc (NIH): 49 mg/dL (ref 0–99)
Triglycerides: 180 mg/dL — ABNORMAL HIGH (ref 0–149)
VLDL Cholesterol Cal: 30 mg/dL (ref 5–40)

## 2024-04-27 LAB — CMP14+EGFR
ALT: 39 IU/L — ABNORMAL HIGH (ref 0–32)
AST: 61 IU/L — ABNORMAL HIGH (ref 0–40)
Albumin: 4.4 g/dL (ref 3.9–4.9)
Alkaline Phosphatase: 80 IU/L (ref 44–121)
BUN/Creatinine Ratio: 6 — ABNORMAL LOW (ref 12–28)
BUN: 7 mg/dL — ABNORMAL LOW (ref 8–27)
Bilirubin Total: 0.4 mg/dL (ref 0.0–1.2)
CO2: 27 mmol/L (ref 20–29)
Calcium: 9.6 mg/dL (ref 8.7–10.3)
Chloride: 98 mmol/L (ref 96–106)
Creatinine, Ser: 1.08 mg/dL — ABNORMAL HIGH (ref 0.57–1.00)
Globulin, Total: 3.3 g/dL (ref 1.5–4.5)
Glucose: 118 mg/dL — ABNORMAL HIGH (ref 70–99)
Potassium: 4.2 mmol/L (ref 3.5–5.2)
Sodium: 143 mmol/L (ref 134–144)
Total Protein: 7.7 g/dL (ref 6.0–8.5)
eGFR: 58 mL/min/{1.73_m2} — ABNORMAL LOW (ref >59)

## 2024-04-27 LAB — TSH: TSH: 1.5 u[IU]/mL (ref 0.450–4.500)

## 2024-05-02 ENCOUNTER — Other Ambulatory Visit: Payer: Self-pay

## 2024-05-02 DIAGNOSIS — R7989 Other specified abnormal findings of blood chemistry: Secondary | ICD-10-CM

## 2024-05-07 ENCOUNTER — Encounter (HOSPITAL_COMMUNITY): Payer: Self-pay

## 2024-05-07 ENCOUNTER — Ambulatory Visit (HOSPITAL_COMMUNITY)
Admission: RE | Admit: 2024-05-07 | Discharge: 2024-05-07 | Disposition: A | Discharge status: Home / Self Care | Source: Ambulatory Visit | Attending: Family Medicine | Admitting: Family Medicine

## 2024-05-07 DIAGNOSIS — Z1231 Encounter for screening mammogram for malignant neoplasm of breast: Secondary | ICD-10-CM | POA: Insufficient documentation

## 2024-06-02 ENCOUNTER — Other Ambulatory Visit: Payer: Self-pay | Admitting: Internal Medicine

## 2024-06-09 ENCOUNTER — Other Ambulatory Visit: Payer: Self-pay | Admitting: Internal Medicine

## 2024-06-13 ENCOUNTER — Telehealth: Payer: Self-pay | Admitting: Nurse Practitioner

## 2024-06-13 ENCOUNTER — Ambulatory Visit: Admitting: Nurse Practitioner

## 2024-06-13 DIAGNOSIS — Z7985 Long-term (current) use of injectable non-insulin antidiabetic drugs: Secondary | ICD-10-CM

## 2024-06-13 DIAGNOSIS — Z794 Long term (current) use of insulin: Secondary | ICD-10-CM

## 2024-06-13 DIAGNOSIS — E782 Mixed hyperlipidemia: Secondary | ICD-10-CM

## 2024-06-13 DIAGNOSIS — Z7984 Long term (current) use of oral hypoglycemic drugs: Secondary | ICD-10-CM

## 2024-06-13 DIAGNOSIS — E1165 Type 2 diabetes mellitus with hyperglycemia: Secondary | ICD-10-CM

## 2024-06-13 DIAGNOSIS — I10 Essential (primary) hypertension: Secondary | ICD-10-CM

## 2024-06-13 MED ORDER — ACCU-CHEK GUIDE TEST VI STRP: ORAL_STRIP | 12 refills | Status: AC

## 2024-06-13 MED ORDER — ACCU-CHEK SOFTCLIX LANCETS MISC: 12 refills | Status: AC

## 2024-06-13 MED ORDER — ACCU-CHEK GUIDE ME W/DEVICE KIT: PACK | 0 refills | Status: AC

## 2024-06-13 NOTE — Telephone Encounter (Signed)
 Pt needs a new meter, test strips, lancets,called into [facility] in [place].  Hers broke.

## 2024-06-13 NOTE — Telephone Encounter (Signed)
 I sent in for Accu check meter, lancets, and strips to [facility]

## 2024-06-18 ENCOUNTER — Other Ambulatory Visit: Payer: Self-pay | Admitting: Family Medicine

## 2024-06-28 ENCOUNTER — Ambulatory Visit: Admitting: Family Medicine

## 2024-07-02 ENCOUNTER — Other Ambulatory Visit: Payer: Self-pay | Admitting: Family Medicine

## 2024-07-03 ENCOUNTER — Encounter: Payer: Self-pay | Admitting: Nurse Practitioner

## 2024-07-03 ENCOUNTER — Ambulatory Visit (INDEPENDENT_AMBULATORY_CARE_PROVIDER_SITE_OTHER): Admitting: Nurse Practitioner

## 2024-07-03 VITALS — BP 136/80 | HR 84 | Ht 63.0 in | Wt 234.2 lb

## 2024-07-03 DIAGNOSIS — I10 Essential (primary) hypertension: Secondary | ICD-10-CM

## 2024-07-03 DIAGNOSIS — E1165 Type 2 diabetes mellitus with hyperglycemia: Secondary | ICD-10-CM

## 2024-07-03 DIAGNOSIS — E782 Mixed hyperlipidemia: Secondary | ICD-10-CM | POA: Diagnosis not present

## 2024-07-03 DIAGNOSIS — Z7985 Long-term (current) use of injectable non-insulin antidiabetic drugs: Secondary | ICD-10-CM | POA: Diagnosis not present

## 2024-07-03 DIAGNOSIS — Z794 Long term (current) use of insulin: Secondary | ICD-10-CM

## 2024-07-03 DIAGNOSIS — Z7984 Long term (current) use of oral hypoglycemic drugs: Secondary | ICD-10-CM

## 2024-07-03 NOTE — Progress Notes (Signed)
 Endocrinology Follow Up Note       07/03/2024, 1:40 PM   Subjective:    Patient ID: Leslie Lyons, female    DOB: 10/27/1961.  Leslie Lyons is being seen in follow up after being seen in consultation for management of currently uncontrolled symptomatic diabetes requested by  Antonetta Rollene BRAVO, MD.   Past Medical History:  Diagnosis Date   Abnormal transaminases    Anxiety    Bladder incontinence    Bronchial asthma    with acute exacerbation   Cancer (HCC) 1980   cervical, diag  at age 3   Depression    Diabetes (HCC)    Diabetes mellitus, type II (HCC)    Headache(784.0)    with reduced vision in right eye for one month    Hyperlipidemia    Hypertension    Obesity     Past Surgical History:  Procedure Laterality Date   ABDOMINAL HYSTERECTOMY     COLONOSCOPY WITH ESOPHAGOGASTRODUODENOSCOPY (EGD)  11/2008   Dr. Mavis: Conscious sedation.  Hiatal hernia.  Hemorrhoids. PATIENT REPORTS BEING AWAKE DURING PROCEDURE   COLONOSCOPY WITH PROPOFOL  N/A 10/08/2022   Procedure: COLONOSCOPY WITH PROPOFOL ;  Surgeon: Cindie Carlin POUR, DO;  Location: [facility];  Service: Endoscopy;  Laterality: N/A;  7:30 AM   CRYOTHERAPY     for abnormal pap   HEMORRHOID SURGERY     dr. keven sharps   PARTIAL HYSTERECTOMY     POLYPECTOMY  10/08/2022   Procedure: POLYPECTOMY;  Surgeon: Cindie Carlin POUR, DO;  Location: [facility];  Service: Endoscopy;;    Social History   Socioeconomic History   Marital status: Married    Spouse name: Not on file   Number of children: 2   Years of education: Not on file   Highest education level: Not on file  Occupational History   Occupation: homemaker   Tobacco Use   Smoking status: Former    Current packs/day: 0.00    Average packs/day: 0.3 packs/day for 2.0 years (0.5 ttl pk-yrs)    Types: Cigarettes    Start date: 12/03/2013    Quit date: 12/04/2015    Years since  quitting: 8.5   Smokeless tobacco: Never  Vaping Use   Vaping status: Never Used  Substance and Sexual Activity   Alcohol use: No   Drug use: No   Sexual activity: Yes  Other Topics Concern   Not on file  Social History Narrative   Lives at home with husband    Social Drivers of Health   Financial Resource Strain: Low Risk  (12/26/2023)   Overall Financial Resource Strain (CARDIA)    Difficulty of Paying Living Expenses: Not hard at all  Food Insecurity: No Food Insecurity (12/26/2023)   Hunger Vital Sign    Worried About Running Out of Food in the Last Year: Never true    Ran Out of Food in the Last Year: Never true  Transportation Needs: No Transportation Needs (12/26/2023)   PRAPARE - Administrator, Civil Service (Medical): No    Lack of Transportation (Non-Medical): No  Physical Activity: Sufficiently Active (12/26/2023)   Exercise Vital Sign  Days of Exercise per Week: 7 days    Minutes of Exercise per Session: 30 min  Stress: No Stress Concern Present (12/26/2023)   [employer] of Occupational Health - Occupational Stress Questionnaire    Feeling of Stress : Not at all  Social Connections: Moderately Integrated (12/26/2023)   Social Connection and Isolation Panel    Frequency of Communication with Friends and Family: More than three times a week    Frequency of Social Gatherings with Friends and Family: More than three times a week    Attends Religious Services: More than 4 times per year    Active Member of [employer] or Organizations: No    Attends Banker Meetings: Never    Marital Status: Married    Family History  Problem Relation Age of Onset   Cancer Mother        lung    COPD Father        emphysema   Hypertension Father    Breast cancer Maternal Aunt    Cancer Maternal Aunt        breast   Cancer Paternal Uncle    Colon cancer Neg Hx     Outpatient Encounter Medications as of 07/03/2024  Medication Sig   Accu-Chek Softclix  Lancets lancets Use as instructed to monitor glucose twice daily   albuterol  (VENTOLIN  HFA) 108 (90 Base) MCG/ACT inhaler INHALE 2 PUFFS BY MOUTH EVERY 6 HOURS AS NEEDED   benzonatate  (TESSALON ) 200 MG capsule Take 1 capsule (200 mg total) by mouth 2 (two) times daily as needed for cough.   blood glucose meter kit and supplies Dispense based on patient and insurance preference. Test blood sugar once daily before breakfast. (FOR ICD-10 E10.9, E11.9).   Blood Glucose Monitoring Suppl (ACCU-CHEK GUIDE ME) w/Device KIT Use to check glucose twice daily   cholecalciferol (VITAMIN D3) 25 MCG (1000 UNIT) tablet Take 1,000 Units by mouth daily.   glucose blood (ACCU-CHEK GUIDE TEST) test strip Use as instructed to monitor glucose twice daily   insulin  degludec (TRESIBA  FLEXTOUCH) 100 UNIT/ML FlexTouch Pen Inject 30 Units into the skin at bedtime.   Insulin  Pen Needle (BD PEN NEEDLE NANO 2ND GEN) 32G X 4 MM MISC USE TO INJECT INSULIN  ONCE DAILY   mirabegron  ER (MYRBETRIQ ) 50 MG TB24 tablet Take 1 tablet (50 mg total) by mouth daily.   mometasone -formoterol  (DULERA ) 200-5 MCG/ACT AERO Inhale 2 puffs into the lungs 2 (two) times daily.   montelukast  (SINGULAIR ) 10 MG tablet Take 1 tablet (10 mg total) by mouth at bedtime.   MYRBETRIQ  50 MG TB24 tablet Take 1 tablet by mouth once daily   omega-3 acid ethyl esters (LOVAZA ) 1 g capsule Take 2 capsules by mouth twice daily   rosuvastatin  (CRESTOR ) 40 MG tablet TAKE 1 TABLET BY MOUTH AT BEDTIME   Semaglutide  (OZEMPIC , 1 MG/DOSE, [place]) Inject 1 mg into the skin once a week.   spironolactone  (ALDACTONE ) 25 MG tablet Take 1 tablet (25 mg total) by mouth daily.   traZODone  (DESYREL ) 100 MG tablet TAKE 1 TABLET BY MOUTH AT BEDTIME   sulfamethoxazole -trimethoprim  (BACTRIM  DS) 800-160 MG tablet Take 1 tablet by mouth 2 (two) times daily. (Patient not taking: Reported on 07/03/2024)   [DISCONTINUED] MYRBETRIQ  50 MG TB24 tablet Take 1 tablet (50 mg total) by mouth daily.    No facility-administered encounter medications on file as of 07/03/2024.    ALLERGIES: Allergies  Allergen Reactions   Janumet  [Sitagliptin  Phos-Metformin  Hcl] Nausea And Vomiting  States she vomits up the whole pill after taking this   Robaxin [Methocarbamol] Swelling    VACCINATION STATUS: Immunization History  Administered Date(s) Administered   Influenza Whole 09/25/2007, 11/04/2009, 09/22/2010   Influenza, Seasonal, Injecte, Preservative Fre 10/12/2023   Influenza,inj,Quad PF,6+ Mos 08/09/2013, 07/22/2014, 11/17/2015, 01/04/2018, 10/03/2018, 08/28/2019, 08/05/2020, 09/15/2022   Janssen (J&J) SARS-COV-2 Vaccination 05/20/2020   PNEUMOCOCCAL CONJUGATE-20 10/12/2023   Pneumococcal Conjugate-13 04/25/2018   Pneumococcal Polysaccharide-23 07/31/2009, 07/05/2016   Td 03/26/2009   Zoster Recombinant(Shingrix) 09/10/2023    Diabetes She presents for her follow-up diabetic visit. She has type 2 diabetes mellitus. Onset time: Diagnosed at approx age of 70. Her disease course has been improving. There are no hypoglycemic associated symptoms. There are no diabetic associated symptoms. There are no hypoglycemic complications. Diabetic complications include peripheral neuropathy. Risk factors for coronary artery disease include diabetes mellitus, dyslipidemia, family history, obesity, hypertension, sedentary lifestyle and post-menopausal. Current diabetic treatment includes oral agent (monotherapy) and insulin  injections. She is compliant with treatment most of the time. Her weight is decreasing steadily. She is following a generally healthy diet. When asked about meal planning, she reported none. She has had a previous visit with a dietitian (went to DM class previously). Her home blood glucose trend is decreasing steadily. Her breakfast blood glucose range is generally 110-130 mg/dl. Her bedtime blood glucose range is generally 130-140 mg/dl. (She presents today with her meter and logs showing  mostly at goal glycemic profile.  Her most recent A1c on 5/29 was 7.9%, increasing from last visit of 7.5%.   Analysis of her meter shows 7-day average of 131, 14-day average of 149, 30-day average of 144.  She denies any significant hypoglycemia.  She notes she has been more active recently.) An ACE inhibitor/angiotensin II receptor blocker is being taken. She sees a podiatrist.Eye exam is not current.     Review of systems  Constitutional: + decreasing body weight, current Body mass index is 41.49 kg/m., + fatigue-improving, no subjective hyperthermia, no subjective hypothermia Eyes: + blurry vision, no xerophthalmia ENT: no sore throat, no nodules palpated in throat, no dysphagia/odynophagia, no hoarseness Cardiovascular: no chest pain, no shortness of breath, no palpitations, no leg swelling Respiratory: no cough, no shortness of breath Gastrointestinal: no nausea/vomiting/diarrhea Musculoskeletal: no muscle/joint aches Skin: no rashes, no hyperemia Neurological: no tremors, no numbness, no tingling, no dizziness Psychiatric: no depression, no anxiety  Objective:     BP 136/80 (BP Location: Left Arm, Patient Position: Sitting, Cuff Size: Large)   Pulse 84   Ht 5' 3 (1.6 m)   Wt 234 lb 3.2 oz (106.2 kg)   BMI 41.49 kg/m   Wt Readings from Last 3 Encounters:  07/03/24 234 lb 3.2 oz (106.2 kg)  04/26/24 240 lb 0.6 oz (108.9 kg)  12/26/23 244 lb (110.7 kg)     BP Readings from Last 3 Encounters:  07/03/24 136/80  04/26/24 122/76  12/01/23 124/78      Physical Exam- Limited  Constitutional:  Body mass index is 41.49 kg/m. , not in acute distress, normal state of mind Eyes:  EOMI, no exophthalmos Musculoskeletal: no gross deformities, strength intact in all four extremities, no gross restriction of joint movements Skin:  no rashes, no hyperemia Neurological: no tremor with outstretched hands  Diabetic Foot Exam - Simple   No data filed     CMP ( most  recent) CMP     Component Value Date/Time   NA 143 04/26/2024 1155   K 4.2 04/26/2024  1155   CL 98 04/26/2024 1155   CO2 27 04/26/2024 1155   GLUCOSE 118 (H) 04/26/2024 1155   GLUCOSE 141 (H) 11/21/2023 1244   BUN 7 (L) 04/26/2024 1155   CREATININE 1.08 (H) 04/26/2024 1155   CREATININE 1.56 (H) 01/07/2020 0913   CALCIUM  9.6 04/26/2024 1155   PROT 7.7 04/26/2024 1155   ALBUMIN 4.4 04/26/2024 1155   AST 61 (H) 04/26/2024 1155   ALT 39 (H) 04/26/2024 1155   ALKPHOS 80 04/26/2024 1155   BILITOT 0.4 04/26/2024 1155   GFRNONAA 56 (L) 11/21/2023 1244   GFRNONAA 69 07/13/2019 0816   GFRAA >60 08/05/2020 1107   GFRAA 80 07/13/2019 0816     Diabetic Labs (most recent): Lab Results  Component Value Date   HGBA1C 7.9 (H) 04/26/2024   HGBA1C 7.5 (A) 09/07/2023   HGBA1C 7.9 (A) 05/04/2023   MICROALBUR 22.2 (H) 08/05/2020   MICROALBUR 8.6 03/27/2019   MICROALBUR 1.9 01/06/2018     Lipid Panel ( most recent) Lipid Panel     Component Value Date/Time   CHOL 128 04/26/2024 1155   TRIG 180 (H) 04/26/2024 1155   HDL 49 04/26/2024 1155   CHOLHDL 2.6 04/26/2024 1155   CHOLHDL 2.9 08/05/2020 1107   VLDL 25 08/05/2020 1107   LDLCALC 49 04/26/2024 1155   LDLCALC 169 (H) 07/13/2019 0816   LABVLDL 30 04/26/2024 1155      Lab Results  Component Value Date   TSH 1.500 04/26/2024   TSH 2.730 02/07/2023   TSH 1.780 04/22/2022   TSH 2.630 06/30/2021   TSH 1.426 08/05/2020   TSH 0.869 04/22/2020   TSH 3.56 03/27/2019   TSH 1.80 01/04/2018   TSH 2.75 05/12/2016   TSH 1.915 07/05/2013   FREET4 1.22 02/07/2023        Assessment & Plan:   1) Type 2 diabetes mellitus with hyperglycemia, without long-term current use of insulin  (HCC)  She presents today with her meter and logs showing mostly at goal glycemic profile.  Her most recent A1c on 5/29 was 7.9%, increasing from last visit of 7.5%.   Analysis of her meter shows 7-day average of 131, 14-day average of 149, 30-day average of  144.  She denies any significant hypoglycemia.  She notes she has been more active recently.  - Leslie Lyons has currently uncontrolled symptomatic type 2 DM since 63 years of age.   -Recent labs reviewed.  - I had a long discussion with her about the progressive nature of diabetes and the pathology behind its complications. -her diabetes is complicated by neuropathy and she remains at a high risk for more acute and chronic complications which include CAD, CVA, CKD, retinopathy, and neuropathy. These are all discussed in detail with her.  The following Lifestyle Medicine recommendations according to American College of Lifestyle Medicine [facility]) were discussed and offered to patient and she agrees to start the journey:  A. Whole Foods, Plant-based plate comprising of fruits and vegetables, plant-based proteins, whole-grain carbohydrates was discussed in detail with the patient.   A list for source of those nutrients were also provided to the patient.  Patient will use only water or unsweetened tea for hydration. B.  The need to stay away from risky substances including alcohol, smoking; obtaining 7 to 9 hours of restorative sleep, at least 150 minutes of moderate intensity exercise weekly, the importance of healthy social connections,  and stress reduction techniques were discussed. C.  A full color page of  Calorie density of various food groups per pound showing examples of each food groups was provided to the patient.  - Nutritional counseling repeated at each appointment due to patients tendency to fall back in to old habits.  - The patient admits there is a room for improvement in their diet and drink choices. -  Suggestion is made for the patient to avoid simple carbohydrates from their diet including Cakes, Sweet Desserts / Pastries, Ice Cream, Soda (diet and regular), Sweet Tea, Candies, Chips, Cookies, Sweet Pastries, Store Bought Juices, Alcohol in Excess of 1-2 drinks a day,  Artificial Sweeteners, Coffee Creamer, and Sugar-free Products. This will help patient to have stable blood glucose profile and potentially avoid unintended weight gain.   - I encouraged the patient to switch to unprocessed or minimally processed complex starch and increased protein intake (animal or plant source), fruits, and vegetables.   - Patient is advised to stick to a routine mealtimes to eat 3 meals a day and avoid unnecessary snacks (to snack only to correct hypoglycemia).  - I have approached her with the following individualized plan to manage her diabetes and patient agrees:   -Given her stable, mostly at target readings, no changes will be made to her regimen today.  She is advised to continue Tresiba  30 units SQ nightly and Ozempic  1 mg SQ weekly.  She gets her medications through the patient assistance program.    -she is encouraged to continue monitoring blood glucose twice daily, before breakfast and before bed, and to call the clinic if he has readings less than 70 or above 300 for 3 tests in a row.   - Adjustment parameters are given to her for hypo and hyperglycemia in writing.  -She did not tolerate Janumet  in the past (severe nausea with vomiting).  - Specific targets for  A1c; LDL, HDL, and Triglycerides were discussed with the patient.  2) Blood Pressure /Hypertension:  her blood pressure is controlled to target today.   she is advised to continue her current medications including Losartan  100 mg p.o. daily with breakfast.  3) Lipids/Hyperlipidemia:    Review of her recent lipid panel from 04/26/24 showed controlled LDL at 49 and elevated triglycerides of 180 (worsening slightly).  She is advised to continue meds as prescribed by PCP.  She does have elevated LFTs, thus is following with her PCP for this.  4)  Weight/Diet:  her Body mass index is 41.49 kg/m.  -  clearly complicating her diabetes care.   she is a candidate for weight loss. I discussed with her the  fact that loss of 5 - 10% of her  current body weight will have the most impact on her diabetes management.  Exercise, and detailed carbohydrates information provided  -  detailed on discharge instructions.  5) Chronic Care/Health Maintenance: -she is on ACEI/ARB and not on Statin medications and is encouraged to initiate and continue to follow up with Ophthalmology, Dentist, Podiatrist at least yearly or according to recommendations, and advised to stay away from smoking. I have recommended yearly flu vaccine and pneumonia vaccine at least every 5 years; moderate intensity exercise for up to 150 minutes weekly; and sleep for at least 7 hours a day.  - she is advised to maintain close follow up with Antonetta Rollene BRAVO, MD for primary care needs, as well as her other providers for optimal and coordinated care.     I spent  37  minutes in the care of the patient today  including review of labs from CMP, Lipids, Thyroid  Function, Hematology (current and previous including abstractions from other facilities); face-to-face time discussing  her blood glucose readings/logs, discussing hypoglycemia and hyperglycemia episodes and symptoms, medications doses, her options of short and long term treatment based on the latest standards of care / guidelines;  discussion about incorporating lifestyle medicine;  and documenting the encounter. Risk reduction counseling performed per USPSTF guidelines to reduce obesity and cardiovascular risk factors.     Please refer to Patient Instructions for Blood Glucose Monitoring and Insulin /Medications Dosing Guide  in media tab for additional information. Please  also refer to  Patient Self Inventory in the Media  tab for reviewed elements of pertinent patient history.  Leslie Lyons Gearing participated in the discussions, expressed understanding, and voiced agreement with the above plans.  All questions were answered to her satisfaction. she is encouraged to contact clinic  should she have any questions or concerns prior to her return visit.     Follow up plan: - Return in about 4 months (around 11/02/2024) for Diabetes F/U with A1c in office, No previsit labs, Bring meter and logs.   Benton Rio, [facility] [address] [place], [place] [address] Phone: [phone] Fax: [phone]  07/03/2024, 1:40 PM

## 2024-07-04 ENCOUNTER — Encounter: Payer: Self-pay | Admitting: Family Medicine

## 2024-08-13 ENCOUNTER — Other Ambulatory Visit: Payer: Self-pay | Admitting: Family Medicine

## 2024-09-03 ENCOUNTER — Other Ambulatory Visit: Payer: Self-pay

## 2024-09-03 ENCOUNTER — Telehealth: Payer: Self-pay

## 2024-09-03 DIAGNOSIS — K7581 Nonalcoholic steatohepatitis (NASH): Secondary | ICD-10-CM

## 2024-09-03 DIAGNOSIS — K529 Noninfective gastroenteritis and colitis, unspecified: Secondary | ICD-10-CM

## 2024-09-03 NOTE — Addendum Note (Signed)
 Addended by: WELLINGTON MILLING on: 09/03/2024 09:52 AM   Modules accepted: Orders

## 2024-09-03 NOTE — Telephone Encounter (Signed)
 Please arrange non urgent ov for fatty liver with Sonny please.

## 2024-09-11 ENCOUNTER — Other Ambulatory Visit: Payer: Self-pay | Admitting: Internal Medicine

## 2024-09-19 ENCOUNTER — Other Ambulatory Visit: Payer: Self-pay | Admitting: Internal Medicine

## 2024-09-19 ENCOUNTER — Other Ambulatory Visit: Payer: Self-pay | Admitting: Family Medicine

## 2024-09-27 NOTE — Progress Notes (Signed)
 JAYDEE INGMAN                                          MRN: [id number]   09/27/2024   The [facility] Quality Team Specialist reviewed this patient medical record for the purposes of chart review for care gap closure. The following were reviewed: abstraction for care gap closure-diabetic eye exam and glycemic status assessment.    [facility]I Quality Team

## 2024-10-12 ENCOUNTER — Ambulatory Visit (INDEPENDENT_AMBULATORY_CARE_PROVIDER_SITE_OTHER): Payer: Self-pay | Admitting: Family Medicine

## 2024-10-12 ENCOUNTER — Encounter: Payer: Self-pay | Admitting: Family Medicine

## 2024-10-12 ENCOUNTER — Telehealth: Payer: Self-pay

## 2024-10-12 VITALS — BP 138/94 | HR 100 | Resp 16 | Ht 63.0 in | Wt 240.1 lb

## 2024-10-12 DIAGNOSIS — F5104 Psychophysiologic insomnia: Secondary | ICD-10-CM

## 2024-10-12 DIAGNOSIS — E7800 Pure hypercholesterolemia, unspecified: Secondary | ICD-10-CM | POA: Diagnosis not present

## 2024-10-12 DIAGNOSIS — E1169 Type 2 diabetes mellitus with other specified complication: Secondary | ICD-10-CM

## 2024-10-12 DIAGNOSIS — Z23 Encounter for immunization: Secondary | ICD-10-CM | POA: Diagnosis not present

## 2024-10-12 DIAGNOSIS — E1159 Type 2 diabetes mellitus with other circulatory complications: Secondary | ICD-10-CM

## 2024-10-12 DIAGNOSIS — Z794 Long term (current) use of insulin: Secondary | ICD-10-CM

## 2024-10-12 DIAGNOSIS — I10 Essential (primary) hypertension: Secondary | ICD-10-CM | POA: Diagnosis not present

## 2024-10-12 DIAGNOSIS — Z6841 Body Mass Index (BMI) 40.0 and over, adult: Secondary | ICD-10-CM

## 2024-10-12 DIAGNOSIS — E1165 Type 2 diabetes mellitus with hyperglycemia: Secondary | ICD-10-CM

## 2024-10-12 MED ORDER — HYDROXYZINE PAMOATE 25 MG PO CAPS
ORAL_CAPSULE | ORAL | 3 refills | Status: AC
Start: 2024-10-12 — End: ?

## 2024-10-12 MED ORDER — MYRBETRIQ 50 MG PO TB24: 50.0000 mg | ORAL_TABLET | Freq: Every day | ORAL | 11 refills | Status: DC

## 2024-10-12 MED ORDER — TRAZODONE HCL 100 MG PO TABS
100.0000 mg | ORAL_TABLET | Freq: Every day | ORAL | 5 refills | Status: AC
Start: 2024-10-12 — End: ?

## 2024-10-12 MED ORDER — SPIRONOLACTONE 50 MG PO TABS: 50.0000 mg | ORAL_TABLET | Freq: Every day | ORAL | 3 refills | Status: AC

## 2024-10-12 NOTE — Assessment & Plan Note (Signed)
 UNCONTROLLED, INC SPIRONOLACTONE  TO 50 MG DASH diet and commitment to daily physical activity for a minimum of 30 minutes discussed and encouraged, as a part of hypertension management. The importance of attaining a healthy weight is also discussed.     10/12/2024    9:49 AM 07/03/2024    1:18 PM 04/26/2024   11:37 AM 04/26/2024   11:08 AM 12/26/2023   10:43 AM 12/01/2023    2:49 PM 12/01/2023    1:49 PM  BP/Weight  Systolic BP 149 136 122 148 -- 124 132  Diastolic BP 100 80 76 84 -- 78 86  Wt. (Lbs) 240.08 234.2  240.04 244    BMI 42.53 kg/m2 41.49 kg/m2  42.52 kg/m2 43.22 kg/m2

## 2024-10-12 NOTE — Telephone Encounter (Signed)
 I am reaching out per Dr. Antonetta. Pt is no longer able to afford Dulera . Are there similar inhalers that her isnurance may cover for a lower cost or assistance to help here with Dulera ?

## 2024-10-12 NOTE — Patient Instructions (Addendum)
 F/U IN 6 TO 8 WEEKS  Nurse pls send to pharmacy to see if  she can get a similar inhaler  as dulera  at lower cost, Need to use this inhaler daily to improve breathing  Lipid, cmp and eGFR, and urine ACR today  Flu vaccine today  NURSE PALS VERIFY AND DOCUMENT TDAP AND SHINGRIX VACCINES IF ABLE  NEW TO HELP WITH SLEEP IS HYDROXYZINE   HIGHER DOSE SAPIRONOLACTONE 50 MG DAILY, BLOOD PRESSURE IS HIGH  Pls call for eye exam appt if you do not get a call soon, I have Referred you  PLS STOP SWEETENED DRINKS AND SUGAR  Thanks for choosing [facility], we consider it a privelige to serve you.

## 2024-10-12 NOTE — Assessment & Plan Note (Addendum)
 Diabetes associated with hypertension, hyperlipidemia, obesity, and arthritis  Leslie Lyons is reminded of the importance of commitment to daily physical activity for 30 minutes or more, as able and the need to limit carbohydrate intake to 30 to 60 grams per meal to help with blood sugar control.   The need to take medication as prescribed, test blood sugar as directed, and to call between visits if there is a concern that blood sugar is uncontrolled is also discussed.   Leslie Lyons is reminded of the importance of daily foot exam, annual eye examination, and good blood sugar, blood pressure and cholesterol control.     Latest Ref Rng & Units 04/26/2024   11:55 AM 11/21/2023   12:44 PM 11/09/2023    4:17 AM 11/08/2023    5:29 AM 11/07/2023    4:49 PM  Diabetic Labs  HbA1c 4.8 - 5.6 % 7.9       Chol 100 - 199 mg/dL 871       HDL >60 mg/dL 49       Calc LDL 0 - 99 mg/dL 49       Triglycerides 0 - 149 mg/dL 819       Creatinine 9.42 - 1.00 mg/dL 8.91  8.87  9.05  8.85  1.41       10/12/2024    9:49 AM 07/03/2024    1:18 PM 04/26/2024   11:37 AM 04/26/2024   11:08 AM 12/26/2023   10:43 AM 12/01/2023    2:49 PM 12/01/2023    1:49 PM  BP/Weight  Systolic BP 149 136 122 148 -- 124 132  Diastolic BP 100 80 76 84 -- 78 86  Wt. (Lbs) 240.08 234.2  240.04 244    BMI 42.53 kg/m2 41.49 kg/m2  42.52 kg/m2 43.22 kg/m2        Latest Ref Rng & Units 10/12/2023    9:00 AM 04/14/2023   12:00 AM  Foot/eye exam completion dates  Eye Exam No Retinopathy  No Retinopathy      Foot Form Completion  Done      This result is from an external source.      MANAGED BY ENDO

## 2024-10-14 ENCOUNTER — Ambulatory Visit: Payer: Self-pay | Admitting: Family Medicine

## 2024-10-14 ENCOUNTER — Encounter: Payer: Self-pay | Admitting: Family Medicine

## 2024-10-14 DIAGNOSIS — Z23 Encounter for immunization: Secondary | ICD-10-CM | POA: Insufficient documentation

## 2024-10-14 LAB — CMP14+EGFR
ALT: 28 IU/L (ref 0–32)
AST: 26 IU/L (ref 0–40)
Albumin: 4.4 g/dL (ref 3.9–4.9)
Alkaline Phosphatase: 65 IU/L (ref 49–135)
BUN/Creatinine Ratio: 9 — ABNORMAL LOW (ref 12–28)
BUN: 8 mg/dL (ref 8–27)
Bilirubin Total: 0.3 mg/dL (ref 0.0–1.2)
CO2: 27 mmol/L (ref 20–29)
Calcium: 9.6 mg/dL (ref 8.7–10.3)
Chloride: 99 mmol/L (ref 96–106)
Creatinine, Ser: 0.92 mg/dL (ref 0.57–1.00)
Globulin, Total: 3.3 g/dL (ref 1.5–4.5)
Glucose: 101 mg/dL — ABNORMAL HIGH (ref 70–99)
Potassium: 4.1 mmol/L (ref 3.5–5.2)
Sodium: 142 mmol/L (ref 134–144)
Total Protein: 7.7 g/dL (ref 6.0–8.5)
eGFR: 70 mL/min/1.73 (ref >59)

## 2024-10-14 LAB — LIPID PANEL
Chol/HDL Ratio: 2.7 ratio (ref 0.0–4.4)
Cholesterol, Total: 151 mg/dL (ref 100–199)
HDL: 55 mg/dL (ref >39)
LDL Chol Calc (NIH): 69 mg/dL (ref 0–99)
Triglycerides: 157 mg/dL — ABNORMAL HIGH (ref 0–149)
VLDL Cholesterol Cal: 27 mg/dL (ref 5–40)

## 2024-10-14 LAB — MICROALBUMIN / CREATININE URINE RATIO
Creatinine, Urine: 157.8 mg/dL
Microalb/Creat Ratio: 41 mg/g{creat} — AB (ref 0–29)
Microalbumin, Urine: 64.4 ug/mL

## 2024-10-14 NOTE — Assessment & Plan Note (Signed)
 After obtaining informed consent, the vaccine is  administered , with no adverse effect noted at the time of administration.

## 2024-10-14 NOTE — Progress Notes (Signed)
 Leslie Lyons     MRN: [id number]      DOB: [birth year]  Chief Complaint  Patient presents with   Hypertension    3 month follow up     HPI Leslie Lyons is here for follow up and re-evaluation of chronic medical conditions, medication management and review of any available recent lab and radiology data.  Preventive health is updated, specifically  Cancer screening and Immunization.   Questions or concerns regarding consultations or procedures which the PT has had in the interim are  addressed. The PT denies any adverse reactions to current medications since the last visit.  There are no new concerns.  There are no specific complaints   ROS Denies recent fever or chills. Denies sinus pressure, nasal congestion, ear pain or sore throat. Denies chest congestion, productive cough or wheezing. Denies chest pains, palpitations and leg swelling Denies abdominal pain, nausea, vomiting,diarrhea or constipation.   Denies dysuria, frequency, hesitancy or incontinence. Denies joint pain, swelling and limitation in mobility. Denies headaches, seizures, numbness, or tingling. Denies depression, anxiety or insomnia. Denies skin break down or rash.   PE  BP (!) 138/94   Pulse 100   Resp 16   Ht 5' 3 (1.6 m)   Wt 240 lb 1.3 oz (108.9 kg)   SpO2 96%   BMI 42.53 kg/m   Patient alert and oriented and in no cardiopulmonary distress.  HEENT: No facial asymmetry, EOMI,     Neck supple .  Chest: Clear to auscultation bilaterally.  CVS: S1, S2 no murmurs, no S3.Regular rate.  ABD: Soft non tender.   Ext: No edema  MS: Adequate ROM spine, shoulders, hips and knees.  Skin: Intact, no ulcerations or rash noted.  Psych: Good eye contact, normal affect. Memory intact not anxious or depressed appearing.  CNS: CN 2-12 intact, power,  normal throughout.no focal deficits noted.   Assessment & Plan  Type 2 diabetes mellitus with hyperglycemia (HCC) Diabetes associated with  hypertension, hyperlipidemia, obesity, and arthritis  Leslie Lyons is reminded of the importance of commitment to daily physical activity for 30 minutes or more, as able and the need to limit carbohydrate intake to 30 to 60 grams per meal to help with blood sugar control.   The need to take medication as prescribed, test blood sugar as directed, and to call between visits if there is a concern that blood sugar is uncontrolled is also discussed.   Leslie Lyons is reminded of the importance of daily foot exam, annual eye examination, and good blood sugar, blood pressure and cholesterol control.     Latest Ref Rng & Units 04/26/2024   11:55 AM 11/21/2023   12:44 PM 11/09/2023    4:17 AM 11/08/2023    5:29 AM 11/07/2023    4:49 PM  Diabetic Labs  HbA1c 4.8 - 5.6 % 7.9       Chol 100 - 199 mg/dL 871       HDL >60 mg/dL 49       Calc LDL 0 - 99 mg/dL 49       Triglycerides 0 - 149 mg/dL 819       Creatinine 9.42 - 1.00 mg/dL 8.91  8.87  9.05  8.85  1.41       10/12/2024    9:49 AM 07/03/2024    1:18 PM 04/26/2024   11:37 AM 04/26/2024   11:08 AM 12/26/2023   10:43 AM 12/01/2023    2:49 PM 12/01/2023  1:49 PM  BP/Weight  Systolic BP 149 136 122 148 -- 124 132  Diastolic BP 100 80 76 84 -- 78 86  Wt. (Lbs) 240.08 234.2  240.04 244    BMI 42.53 kg/m2 41.49 kg/m2  42.52 kg/m2 43.22 kg/m2        Latest Ref Rng & Units 10/12/2023    9:00 AM 04/14/2023   12:00 AM  Foot/eye exam completion dates  Eye Exam No Retinopathy  No Retinopathy      Foot Form Completion  Done      This result is from an external source.      MANAGED BY ENDO  Essential hypertension, benign UNCONTROLLED, INC SPIRONOLACTONE  TO 50 MG DASH diet and commitment to daily physical activity for a minimum of 30 minutes discussed and encouraged, as a part of hypertension management. The importance of attaining a healthy weight is also discussed.     10/12/2024    9:49 AM 07/03/2024    1:18 PM 04/26/2024   11:37 AM  04/26/2024   11:08 AM 12/26/2023   10:43 AM 12/01/2023    2:49 PM 12/01/2023    1:49 PM  BP/Weight  Systolic BP 149 136 122 148 -- 124 132  Diastolic BP 100 80 76 84 -- 78 86  Wt. (Lbs) 240.08 234.2  240.04 244    BMI 42.53 kg/m2 41.49 kg/m2  42.52 kg/m2 43.22 kg/m2

## 2024-10-15 ENCOUNTER — Telehealth: Payer: Self-pay

## 2024-10-15 MED ORDER — FLUTICASONE FUROATE-VILANTEROL 200-25 MCG/ACT IN AEPB: 1.0000 | INHALATION_SPRAY | Freq: Every day | RESPIRATORY_TRACT | 11 refills | Status: AC

## 2024-10-15 NOTE — Progress Notes (Addendum)
   10/15/2024  Patient ID: Leslie Lyons, female   DOB: 01-10-1961, 63 y.o.   MRN: [id number]  Reviewed patient's insurance coverage related to maintenance asthma inhalers. Currently on high dose ICS-LABA combination Dulera . This drug is currently $100/30 day supply. The alternative high dose ICS-LABA, Breo is $47/30 day supply. Recommend change to Breo Ellipta 200mcg/25mcg one inhalation daily. Will send message to Abby who had requested this recommendation.   Lang Sieve, PharmD, BCGP Clinical Pharmacist  [phone]

## 2024-10-15 NOTE — Telephone Encounter (Signed)
 Pls let pt know Bfreo is prescribed in place of dulera  and is more affortdable

## 2024-10-15 NOTE — Addendum Note (Signed)
 Addended by: ANTONETTA QUANT E on: 10/15/2024 10:44 AM   Modules accepted: Orders

## 2024-10-15 NOTE — Telephone Encounter (Signed)
Pt informed

## 2024-10-31 ENCOUNTER — Encounter: Payer: Self-pay | Admitting: Gastroenterology

## 2024-10-31 ENCOUNTER — Ambulatory Visit: Admitting: Gastroenterology

## 2024-10-31 VITALS — BP 120/76 | HR 96 | Temp 98.8°F | Ht 63.0 in | Wt 232.0 lb

## 2024-10-31 DIAGNOSIS — K7581 Nonalcoholic steatohepatitis (NASH): Secondary | ICD-10-CM | POA: Diagnosis not present

## 2024-10-31 NOTE — Patient Instructions (Addendum)
 Please update labs at your convenience. We will reach out with results as available. Feel free to call if you have not heard anything within 5 business days of having labs drawn, to make sure we have received results.  For Metabolic dysfunction-associated steatohepatitis (formerly known as fatty liver): Goals are reducing risk factors.  It is important to control your diabetes, high cholesterol, and high blood pressure. Continue daily exercise with minimum of 150 minutes per week. Continue to eat low fat/low sweets diet. Continue drinking coffee, two cups per day. Drink plenty of water, goal of 100 ounces per day. Consume plenty of protein, try to get at least 30 grams with each meal.

## 2024-10-31 NOTE — Progress Notes (Signed)
 GI Office Note    Referring Provider: Antonetta Rollene BRAVO, MD Primary Care Physician:  Antonetta Rollene BRAVO, MD  Primary Gastroenterologist: Carlin POUR. Cindie, DO   Chief Complaint   Chief Complaint  Patient presents with   fatty liver    History of Present Illness   Leslie Lyons is a 63 y.o. female presenting today for follow up. Last seen in 10/2023 for colitis. She has history of fatty liver and overdue for follow up.   Previous u/s with elastography with low median kPa of 3.9.   Discussed the use of AI scribe software for clinical note transcription with the patient, who gave verbal consent to proceed.  History of Present Illness Leslie Lyons is a 63 year old female with fatty liver disease who presents for follow-up on liver function and management.  Six months ago, her liver function tests were elevated. She attributes the previous elevation to a medication change that caused vomiting and malaise. The medication was subsequently changed, and she has been feeling better since.  She has been on Ozempic  for about a year, starting at 0.5 mg and increasing to 1 mg weekly, with a potential further increase. She has lost 12-14 pounds this year and aims to reduce her weight to 150 pounds. She is actively engaged in physical activities, including working out at [employer] and doing exercises at home.  No alcohol consumption and no use of over-the-counter supplements. She experiences some indigestion but no abdominal pain, nausea, or vomiting at present. Bowel movements occur every two to three days without constipation. She drinks four to five 16-ounce bottles of water daily and has a cup of coffee each morning. Denies abdominal pain. No melena, brbpr.  Her last A1c was checked in May, and she has an upcoming appointment on the 8th with her endocrinologist for further evaluation.     Wt Readings from Last 5 Encounters:  10/31/24 232 lb (105.2 kg)  10/12/24 240 lb 1.3  oz (108.9 kg)  07/03/24 234 lb 3.2 oz (106.2 kg)  04/26/24 240 lb 0.6 oz (108.9 kg)  12/26/23 244 lb (110.7 kg)     Prior Data         Latest Ref Rng & Units 10/12/2024   10:44 AM 04/26/2024   11:55 AM 11/21/2023   12:44 PM  Hepatic Function  Total Protein 6.0 - 8.5 g/dL 7.7  7.7  8.3   Albumin 3.9 - 4.9 g/dL 4.4  4.4  4.2   AST 0 - 40 IU/L 26  61  28   ALT 0 - 32 IU/L 28  39  28   Alk Phosphatase 49 - 135 IU/L 65  80  54   Total Bilirubin 0.0 - 1.2 mg/dL 0.3  0.4  0.3     October 12, 2024: Total cholesterol 151, LDL 69  Apr 26, 2024: White blood cell count 7.8, hemoglobin 12.8, platelets 201, albumin 4.4, total bilirubin 0.4, alk phos 80, AST 61, ALT 39.  A1c 7.9.  Abdominal ultrasound with elastography September 2023: IMPRESSION: ULTRASOUND RUQ: Unremarkable ULTRASOUND HEPATIC ELASTOGRAPHY: Median kPa:  3.9 Diagnostic category:  < or = 5 kPa: high probability of being normal   Colonoscopy October 2023: -Hemorrhoids found on perianal exam -Nonbleeding internal hemorrhoids -Diverticulosis in the sigmoid colon -three to 7 mm polyps in the sigmoid colon, descending colon, transverse colon -5-year surveillance colonoscopy    Medications   Current Outpatient Medications  Medication Sig Dispense Refill  Accu-Chek Softclix Lancets lancets Use as instructed to monitor glucose twice daily 100 each 12   albuterol  (VENTOLIN  HFA) 108 (90 Base) MCG/ACT inhaler INHALE 2 PUFFS BY MOUTH EVERY 6 HOURS AS NEEDED 9 g 0   blood glucose meter kit and supplies Dispense based on patient and insurance preference. Test blood sugar once daily before breakfast. (FOR ICD-10 E10.9, E11.9). 1 each 0   Blood Glucose Monitoring Suppl (ACCU-CHEK GUIDE ME) w/Device KIT Use to check glucose twice daily 1 kit 0   cholecalciferol (VITAMIN D3) 25 MCG (1000 UNIT) tablet Take 1,000 Units by mouth daily.     fluticasone  furoate-vilanterol (BREO ELLIPTA ) 200-25 MCG/ACT AEPB Inhale 1 puff into the lungs  daily. 1 each 11   glucose blood (ACCU-CHEK GUIDE TEST) test strip Use as instructed to monitor glucose twice daily 100 each 12   hydrOXYzine  (VISTARIL ) 25 MG capsule TAKE ONE CAPSULE AT BEDTIME FOR SLEEP 30 capsule 3   insulin  degludec (TRESIBA  FLEXTOUCH) 100 UNIT/ML FlexTouch Pen Inject 30 Units into the skin at bedtime. 21 mL 3   Insulin  Pen Needle (BD PEN NEEDLE NANO 2ND GEN) 32G X 4 MM MISC USE TO INJECT INSULIN  ONCE DAILY 100 each 3   montelukast  (SINGULAIR ) 10 MG tablet Take 1 tablet (10 mg total) by mouth at bedtime. 30 tablet 3   MYRBETRIQ  50 MG TB24 tablet Take 1 tablet (50 mg total) by mouth daily. 30 tablet 11   omega-3 acid ethyl esters (LOVAZA ) 1 g capsule Take 2 capsules by mouth twice daily 360 capsule 1   rosuvastatin  (CRESTOR ) 40 MG tablet TAKE 1 TABLET BY MOUTH AT BEDTIME 90 tablet 0   Semaglutide  (OZEMPIC , 1 MG/DOSE, [place]) Inject 1 mg into the skin once a week.     spironolactone  (ALDACTONE ) 50 MG tablet Take 1 tablet (50 mg total) by mouth daily. 30 tablet 3   traZODone  (DESYREL ) 100 MG tablet Take 1 tablet (100 mg total) by mouth at bedtime. 30 tablet 5   No current facility-administered medications for this visit.    Allergies   Allergies as of 10/31/2024 - Review Complete 10/31/2024  Allergen Reaction Noted   Janumet  [sitagliptin  phos-metformin  hcl] Nausea And Vomiting 10/03/2018   Robaxin [methocarbamol] Swelling 07/31/2013     Past Medical History   Past Medical History:  Diagnosis Date   Abnormal transaminases    Anxiety    Bladder incontinence    Bronchial asthma    with acute exacerbation   Cancer (HCC) 1980   cervical, diag  at age 52   Depression    Diabetes (HCC)    Diabetes mellitus, type II (HCC)    Headache(784.0)    with reduced vision in right eye for one month    Hyperlipidemia    Hypertension    Obesity     Past Surgical History   Past Surgical History:  Procedure Laterality Date   ABDOMINAL HYSTERECTOMY     COLONOSCOPY WITH  ESOPHAGOGASTRODUODENOSCOPY (EGD)  11/2008   Dr. Mavis: Conscious sedation.  Hiatal hernia.  Hemorrhoids. PATIENT REPORTS BEING AWAKE DURING PROCEDURE   COLONOSCOPY WITH PROPOFOL  N/A 10/08/2022   Procedure: COLONOSCOPY WITH PROPOFOL ;  Surgeon: Cindie Carlin POUR, DO;  Location: [facility];  Service: Endoscopy;  Laterality: N/A;  7:30 AM   CRYOTHERAPY     for abnormal pap   HEMORRHOID SURGERY     dr. keven sharps   PARTIAL HYSTERECTOMY     POLYPECTOMY  10/08/2022   Procedure: POLYPECTOMY;  Surgeon: Cindie,  Carlin POUR, DO;  Location: [facility];  Service: Endoscopy;;    Past Family History   Family History  Problem Relation Age of Onset   Cancer Mother        lung    COPD Father        emphysema   Hypertension Father    Breast cancer Maternal Aunt    Cancer Maternal Aunt        breast   Cancer Paternal Uncle    Colon cancer Neg Hx     Past Social History   Social History   Socioeconomic History   Marital status: Married    Spouse name: Not on file   Number of children: 2   Years of education: Not on file   Highest education level: Not on file  Occupational History   Occupation: homemaker   Tobacco Use   Smoking status: Former    Current packs/day: 0.00    Average packs/day: 0.3 packs/day for 2.0 years (0.5 ttl pk-yrs)    Types: Cigarettes    Start date: 12/03/2013    Quit date: 12/04/2015    Years since quitting: 8.9   Smokeless tobacco: Never  Vaping Use   Vaping status: Never Used  Substance and Sexual Activity   Alcohol use: No   Drug use: No   Sexual activity: Yes  Other Topics Concern   Not on file  Social History Narrative   Lives at home with husband    Social Drivers of Health   Financial Resource Strain: Low Risk  (12/26/2023)   Overall Financial Resource Strain (CARDIA)    Difficulty of Paying Living Expenses: Not hard at all  Food Insecurity: No Food Insecurity (12/26/2023)   Hunger Vital Sign    Worried About Running Out of Food in the Last  Year: Never true    Ran Out of Food in the Last Year: Never true  Transportation Needs: No Transportation Needs (12/26/2023)   PRAPARE - Administrator, Civil Service (Medical): No    Lack of Transportation (Non-Medical): No  Physical Activity: Sufficiently Active (12/26/2023)   Exercise Vital Sign    Days of Exercise per Week: 7 days    Minutes of Exercise per Session: 30 min  Stress: No Stress Concern Present (12/26/2023)   [employer] of Occupational Health - Occupational Stress Questionnaire    Feeling of Stress : Not at all  Social Connections: Moderately Integrated (12/26/2023)   Social Connection and Isolation Panel    Frequency of Communication with Friends and Family: More than three times a week    Frequency of Social Gatherings with Friends and Family: More than three times a week    Attends Religious Services: More than 4 times per year    Active Member of [employer] or Organizations: No    Attends Banker Meetings: Never    Marital Status: Married  Catering Manager Violence: Not At Risk (12/26/2023)   Humiliation, Afraid, Rape, and Kick questionnaire    Fear of Current or Ex-Partner: No    Emotionally Abused: No    Physically Abused: No    Sexually Abused: No    Review of Systems   General: Negative for anorexia, weight loss, fever, chills, fatigue, weakness. ENT: Negative for hoarseness, difficulty swallowing , nasal congestion. CV: Negative for chest pain, angina, palpitations, dyspnea on exertion, peripheral edema.  Respiratory: Negative for dyspnea at rest, dyspnea on exertion, cough, sputum, wheezing.  GI: See history of present illness.  GU:  Negative for dysuria, hematuria, urinary incontinence, urinary frequency, nocturnal urination.  Endo: Negative for unusual weight change.     Physical Exam   BP 120/76   Pulse 96   Temp 98.8 F (37.1 C) (Oral)   Ht 5' 3 (1.6 m)   Wt 232 lb (105.2 kg)   SpO2 96%   BMI 41.10 kg/m    General:  Well-nourished, well-developed in no acute distress.  Eyes: No icterus. Mouth: Oropharyngeal mucosa moist and pink   Abdomen: Bowel sounds are normal, nontender, nondistended, no hepatosplenomegaly or masses,  no abdominal bruits or hernia , no rebound or guarding.  Rectal: not performed Extremities: No lower extremity edema. No clubbing or deformities. Neuro: Alert and oriented x 4   Skin: Warm and dry, no jaundice.   Psych: Alert and cooperative, normal mood and affect.  Labs   See above  Imaging Studies   No results found.  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 Fatty liver present on imaging with low median kPa in 2023. Her NAFLD score was indeterminate at that time. Weight loss of 12-14 pounds noted. No alcohol use.  - update labs to calculate FIB-4, NAFLD, and obtain ELF  - Provided education on fatty liver disease including appropriate diet, lifestyle modifications needed. - Encouraged weight loss and exercise of minimum of 150 minutes per week - Goal of tight glycemic control with improved A1C, avoiding fatty foods, sweets. Continue to work closely with endocrinology.  - Advised increasing coffee intake to two cups daily as this has been seen in studies to reduce liver inflammation due to [facility]. Advised to limit creamer and no sugar. - Drink at least 100 ounces of water daily.  - Consume at least 30 grams of protein with each meal. - Continue to avoid all alcohol.     Sonny RAMAN. Ezzard, MHS, PA-C [facility]

## 2024-11-05 ENCOUNTER — Ambulatory Visit: Admitting: Nurse Practitioner

## 2024-11-05 ENCOUNTER — Telehealth: Payer: Self-pay | Admitting: Nurse Practitioner

## 2024-11-05 DIAGNOSIS — I10 Essential (primary) hypertension: Secondary | ICD-10-CM

## 2024-11-05 DIAGNOSIS — E782 Mixed hyperlipidemia: Secondary | ICD-10-CM

## 2024-11-05 DIAGNOSIS — Z7985 Long-term (current) use of injectable non-insulin antidiabetic drugs: Secondary | ICD-10-CM

## 2024-11-05 DIAGNOSIS — Z794 Long term (current) use of insulin: Secondary | ICD-10-CM

## 2024-11-05 DIAGNOSIS — E1165 Type 2 diabetes mellitus with hyperglycemia: Secondary | ICD-10-CM

## 2024-11-05 DIAGNOSIS — Z7984 Long term (current) use of oral hypoglycemic drugs: Secondary | ICD-10-CM

## 2024-11-05 NOTE — Telephone Encounter (Signed)
 Patient said she is on her last Pen of Ozempic  and states she gets patient assistance. Please Advise

## 2024-11-05 NOTE — Telephone Encounter (Signed)
 Spoke with pt advising her to contact [employer] to see if there has been any delay in her PAP supplies, contact number for [employer] given to pt. Also advised pt I would send in a refill request  for her Tresiba  and Ozempic  to [employer]. Pt voiced understanding.  PAP refill order faxed to [employer].

## 2024-11-13 LAB — CBC WITH DIFFERENTIAL/PLATELET
Basophils Absolute: 0.1 x10E3/uL (ref 0.0–0.2)
Basos: 1 %
EOS (ABSOLUTE): 0.1 x10E3/uL (ref 0.0–0.4)
Eos: 1 %
Hematocrit: 41.2 % (ref 34.0–46.6)
Hemoglobin: 13.2 g/dL (ref 11.1–15.9)
Immature Grans (Abs): 0 x10E3/uL (ref 0.0–0.1)
Immature Granulocytes: 0 %
Lymphocytes Absolute: 4.7 x10E3/uL — ABNORMAL HIGH (ref 0.7–3.1)
Lymphs: 54 %
MCH: 27.8 pg (ref 26.6–33.0)
MCHC: 32 g/dL (ref 31.5–35.7)
MCV: 87 fL (ref 79–97)
Monocytes Absolute: 0.6 x10E3/uL (ref 0.1–0.9)
Monocytes: 7 %
Neutrophils Absolute: 3.2 x10E3/uL (ref 1.4–7.0)
Neutrophils: 37 %
Platelets: 200 x10E3/uL (ref 150–450)
RBC: 4.74 x10E6/uL (ref 3.77–5.28)
RDW: 13.4 % (ref 11.7–15.4)
WBC: 8.7 x10E3/uL (ref 3.4–10.8)

## 2024-11-13 LAB — HEPATIC FUNCTION PANEL
ALT: 25 IU/L (ref 0–32)
AST: 23 IU/L (ref 0–40)
Albumin: 4.6 g/dL (ref 3.9–4.9)
Alkaline Phosphatase: 65 IU/L (ref 49–135)
Bilirubin Total: 0.3 mg/dL (ref 0.0–1.2)
Bilirubin, Direct: 0.11 mg/dL (ref 0.00–0.40)
Total Protein: 7.8 g/dL (ref 6.0–8.5)

## 2024-11-13 LAB — ENHANCED LIVER FIBROSIS (ELF): ELF(TM) Score: 9.45 (ref <9.80)

## 2024-11-17 ENCOUNTER — Ambulatory Visit: Payer: Self-pay | Admitting: Gastroenterology

## 2024-11-20 ENCOUNTER — Telehealth: Payer: Self-pay | Admitting: *Deleted

## 2024-11-20 ENCOUNTER — Other Ambulatory Visit: Payer: Self-pay | Admitting: *Deleted

## 2024-11-20 DIAGNOSIS — K7581 Nonalcoholic steatohepatitis (NASH): Secondary | ICD-10-CM

## 2024-11-20 NOTE — Telephone Encounter (Signed)
 Patient called to say that she had heard from [employer]. She will be getting help for her Tresiba , and not the Ozempic . Then, she has now received a letter from [employer] stating sharing how she is to use the Ozwmpic. Patient was advised that the beginning of 2026 they would not be covering the Ozempic  if you had Medicare. Also recommended that she call them and share with them what she had with me, and to share that she needed clarification. Number was provided.

## 2024-11-27 ENCOUNTER — Ambulatory Visit (HOSPITAL_COMMUNITY)
Admission: RE | Admit: 2024-11-27 | Discharge: 2024-11-27 | Disposition: A | Discharge status: Home / Self Care | Source: Ambulatory Visit | Attending: Gastroenterology | Admitting: Gastroenterology

## 2024-11-27 DIAGNOSIS — K7581 Nonalcoholic steatohepatitis (NASH): Secondary | ICD-10-CM | POA: Diagnosis present

## 2024-12-03 ENCOUNTER — Ambulatory Visit: Payer: Self-pay | Admitting: Gastroenterology

## 2024-12-03 DIAGNOSIS — K7581 Nonalcoholic steatohepatitis (NASH): Secondary | ICD-10-CM

## 2024-12-19 ENCOUNTER — Telehealth: Payer: Self-pay

## 2024-12-19 MED ORDER — MIRABEGRON ER 50 MG PO TB24: 50.0000 mg | ORAL_TABLET | Freq: Every day | ORAL | 3 refills | Status: DC

## 2024-12-19 NOTE — Telephone Encounter (Signed)
 Copied from CRM [id number]. Topic: Clinical - Prescription Issue >> Dec 19, 2024  8:25 AM Emylou G wrote: Reason for CRM: Patient adv she tried to pick up her bladder pill MYRBETRIQ  50 MG TB24 tablet - said it was $377.. is there an alternative?

## 2024-12-19 NOTE — Addendum Note (Signed)
 Addended by: ANTONETTA ROLLENE BRAVO on: 12/19/2024 09:43 AM   Modules accepted: Orders

## 2024-12-19 NOTE — Telephone Encounter (Signed)
 I sent the generic equivalent , if that does not go thru can try cost with [facility] as well, then ditropan  or  vesicare  will be sent

## 2024-12-20 ENCOUNTER — Ambulatory Visit: Admitting: Family Medicine

## 2024-12-26 ENCOUNTER — Telehealth: Payer: Self-pay | Admitting: Nurse Practitioner

## 2024-12-26 ENCOUNTER — Telehealth: Payer: Self-pay

## 2024-12-26 ENCOUNTER — Ambulatory Visit (INDEPENDENT_AMBULATORY_CARE_PROVIDER_SITE_OTHER): Payer: Medicare Other

## 2024-12-26 VITALS — Ht 63.0 in | Wt 232.0 lb

## 2024-12-26 DIAGNOSIS — Z0000 Encounter for general adult medical examination without abnormal findings: Secondary | ICD-10-CM

## 2024-12-26 MED ORDER — OXYBUTYNIN CHLORIDE ER 10 MG PO TB24: 10.0000 mg | ORAL_TABLET | Freq: Every day | ORAL | 2 refills | Status: AC

## 2024-12-26 NOTE — Addendum Note (Signed)
 Addended by: ANTONETTA QUANT E on: 12/26/2024 11:41 AM   Modules accepted: Orders

## 2024-12-26 NOTE — Telephone Encounter (Signed)
 Pt said she is out of ozempic . Can you call pt to make her aware that she is not eligible for that anymore with Patient Assistance due to her insurane

## 2024-12-26 NOTE — Telephone Encounter (Signed)
 Patient has PAP here (tresiba ) for pick up. Called pt to let her know

## 2024-12-26 NOTE — Patient Instructions (Signed)
 Leslie Lyons,  Thank you for taking the time for your Medicare Wellness Visit. I appreciate your continued commitment to your health goals. Please review the care plan we discussed, and feel free to reach out if I can assist you further.  Please note that Annual Wellness Visits do not include a physical exam. Some assessments may be limited, especially if the visit was conducted virtually. If needed, we may recommend an in-person follow-up with your provider.  Ongoing Care Seeing your primary care provider every 3 to 6 months helps us  monitor your health and provide consistent, personalized care.   Aim for 30 minutes of exercise or brisk walking, 6-8 glasses of water, and 5 servings of fruits and vegetables each day.  Referrals If a referral was made during today's visit and you haven't received any updates within two weeks, please contact the referred provider directly to check on the status.  Recommended Screenings:  Health Maintenance  Topic Date Due   DTaP/Tdap/Td vaccine (2 - Tdap) 03/27/2019   Zoster (Shingles) Vaccine (2 of 2) 11/05/2023   Eye exam for diabetics  04/13/2024   COVID-19 Vaccine (2 - 2025-26 season) 07/30/2024   Complete foot exam   10/23/2024   Hemoglobin A1C  10/27/2024   Medicare Annual Wellness Visit  12/25/2024   Kidney health urinalysis for diabetes  04/11/2025   Breast Cancer Screening  05/07/2025   Yearly kidney function blood test for diabetes  10/12/2025   Colon Cancer Screening  10/09/2027   Pap with HPV screening  02/04/2028   Pneumococcal Vaccine for age over 57  Completed   Flu Shot  Completed   HPV Vaccine (No Doses Required) Completed   Hepatitis C Screening  Completed   HIV Screening  Completed   Hepatitis B Vaccine  Aged Out   Meningitis B Vaccine  Aged Out       12/26/2024    9:25 AM  Advanced Directives  Does Patient Have a Medical Advance Directive? No  Would patient like information on creating a medical advance directive? No -  Patient declined    Vision: Annual vision screenings are recommended for early detection of glaucoma, cataracts, and diabetic retinopathy. These exams can also reveal signs of chronic conditions such as diabetes and high blood pressure.  Dental: Annual dental screenings help detect early signs of oral cancer, gum disease, and other conditions linked to overall health, including heart disease and diabetes.  Please see the attached documents for additional preventive care recommendations.

## 2024-12-26 NOTE — Telephone Encounter (Signed)
 Called [employer], they said she was approved for Tresiba  but not the Ozempic .  PAP stated they sent a letter out and spoke to pt in December.  They have sent a request for the denial letter to be sent back to us , stated it could be a week or two before we get that.

## 2024-12-26 NOTE — Telephone Encounter (Signed)
 Spoke with pt making her aware we have her Tresiba  from PAP but we have not received any Ozempic  at this present time. Advised pt we were notified pt's with Medicare will no longer qualify for Ozempic  through PAP. Pt states she was told by [employer] that she would qualify for Ozempic , that she received a letter and text stating the same. Pt stated she would call [employer] back to confirm and would notify the office afterwards.

## 2024-12-26 NOTE — Telephone Encounter (Signed)
 Oxybutynin  is  prescribed

## 2024-12-26 NOTE — Telephone Encounter (Signed)
 Copied from CRM #[id number]. Topic: Clinical - Prescription Issue >> Dec 26, 2024  8:04 AM Ivette P wrote: Reason for CRM: PT called in about medication mirabegron  ER (MYRBETRIQ ) 50 MG TB24 tablet. Bladder pills ?   Pt says she is being charge 377 and can't pay that. Is there another option    Please follow up before end of day.

## 2024-12-26 NOTE — Progress Notes (Signed)
 "  Chief Complaint  Patient presents with   Medicare Wellness     Subjective:   Leslie Lyons is a 64 y.o. female who presents for a Medicare Annual Wellness Visit.  Visit info / Clinical Intake: Medicare Wellness Visit Type:: Subsequent Annual Wellness Visit Persons participating in visit and providing information:: patient Medicare Wellness Visit Mode:: Telephone If telephone:: video declined Since this visit was completed virtually, some vitals may be partially provided or unavailable. Missing vitals are due to the limitations of the virtual format.: Documented vitals are patient reported If Telephone or Video please confirm:: I connected with patient using audio/video enable telemedicine. I verified patient identity with two identifiers, discussed telehealth limitations, and patient agreed to proceed. Patient Location:: home Provider Location:: home office Interpreter Needed?: No Pre-visit prep was completed: yes AWV questionnaire completed by patient prior to visit?: no Living arrangements:: lives with spouse/significant other Patient's Overall Health Status Rating: (!) fair Typical amount of pain: none Does pain affect daily life?: (!) yes Are you currently prescribed opioids?: no  Dietary Habits and Nutritional Risks How many meals a day?: 2 Eats fruit and vegetables daily?: yes Most meals are obtained by: preparing own meals In the last 2 weeks, have you had any of the following?: none Diabetic:: (!) yes Any non-healing wounds?: no How often do you check your BS?: 2 Would you like to be referred to a Nutritionist or for Diabetic Management? : no  Functional Status Activities of Daily Living (to include ambulation/medication): Independent Ambulation: Independent with device- listed below Home Assistive Devices/Equipment: Johna Finder (specify Type) Medication Administration: Independent Home Management (perform basic housework or laundry): Independent Manage your  own finances?: yes Primary transportation is: family / friends Concerns about vision?: no *vision screening is required for WTM* Concerns about hearing?: no  Fall Screening Falls in the past year?: 0 Number of falls in past year: 0 Was there an injury with Fall?: 0 Fall Risk Category Calculator: 0 Patient Fall Risk Level: Low Fall Risk  Fall Risk Patient at Risk for Falls Due to: Impaired balance/gait; Impaired mobility Fall risk Follow up: Falls evaluation completed; Education provided; Falls prevention discussed  Home and Transportation Safety: All rugs have non-skid backing?: yes All stairs or steps have railings?: yes Grab bars in the bathtub or shower?: (!) no Have non-skid surface in bathtub or shower?: yes Good home lighting?: yes Regular seat belt use?: yes Hospital stays in the last year:: no  Cognitive Assessment Difficulty concentrating, remembering, or making decisions? : no Will 6CIT or Mini Cog be Completed: yes What year is it?: 0 points What month is it?: 0 points Give patient an address phrase to remember (5 components): [address] [place] About what time is it?: 0 points Count backwards from 20 to 1: 0 points Say the months of the year in reverse: 0 points Repeat the address phrase from earlier: 0 points 6 CIT Score: 0 points  Advance Directives (For Healthcare) Does Patient Have a Medical Advance Directive?: No Would patient like information on creating a medical advance directive?: No - Patient declined  Reviewed/Updated  Reviewed/Updated: Reviewed All (Medical, Surgical, Family, Medications, Allergies, Care Teams, Patient Goals)    Allergies (verified) Janumet  [sitagliptin  phos-metformin  hcl] and Robaxin [methocarbamol]   Current Medications (verified) Outpatient Encounter Medications as of 12/26/2024  Medication Sig   Accu-Chek Softclix Lancets lancets Use as instructed to monitor glucose twice daily   albuterol  (VENTOLIN  HFA) 108 (90  Base) MCG/ACT inhaler INHALE 2  PUFFS BY MOUTH EVERY 6 HOURS AS NEEDED   blood glucose meter kit and supplies Dispense based on patient and insurance preference. Test blood sugar once daily before breakfast. (FOR ICD-10 E10.9, E11.9).   Blood Glucose Monitoring Suppl (ACCU-CHEK GUIDE ME) w/Device KIT Use to check glucose twice daily   cholecalciferol (VITAMIN D3) 25 MCG (1000 UNIT) tablet Take 1,000 Units by mouth daily.   fluticasone  furoate-vilanterol (BREO ELLIPTA ) 200-25 MCG/ACT AEPB Inhale 1 puff into the lungs daily.   glucose blood (ACCU-CHEK GUIDE TEST) test strip Use as instructed to monitor glucose twice daily   hydrOXYzine  (VISTARIL ) 25 MG capsule TAKE ONE CAPSULE AT BEDTIME FOR SLEEP   insulin  degludec (TRESIBA  FLEXTOUCH) 100 UNIT/ML FlexTouch Pen Inject 30 Units into the skin at bedtime.   Insulin  Pen Needle (BD PEN NEEDLE NANO 2ND GEN) 32G X 4 MM MISC USE TO INJECT INSULIN  ONCE DAILY   mirabegron  ER (MYRBETRIQ ) 50 MG TB24 tablet Take 1 tablet (50 mg total) by mouth daily.   montelukast  (SINGULAIR ) 10 MG tablet Take 1 tablet (10 mg total) by mouth at bedtime.   omega-3 acid ethyl esters (LOVAZA ) 1 g capsule Take 2 capsules by mouth twice daily   rosuvastatin  (CRESTOR ) 40 MG tablet TAKE 1 TABLET BY MOUTH AT BEDTIME   Semaglutide  (OZEMPIC , 1 MG/DOSE, Winston) Inject 1 mg into the skin once a week.   spironolactone  (ALDACTONE ) 50 MG tablet Take 1 tablet (50 mg total) by mouth daily.   traZODone  (DESYREL ) 100 MG tablet Take 1 tablet (100 mg total) by mouth at bedtime.   No facility-administered encounter medications on file as of 12/26/2024.    History: Past Medical History:  Diagnosis Date   Abnormal transaminases    Anxiety    Bladder incontinence    Bronchial asthma    with acute exacerbation   Cancer (HCC) 1980   cervical, diag  at age 79   Depression    Diabetes (HCC)    Diabetes mellitus, type II (HCC)    Headache(784.0)    with reduced vision in right eye for one month     Hyperlipidemia    Hypertension    Obesity    Past Surgical History:  Procedure Laterality Date   ABDOMINAL HYSTERECTOMY     COLONOSCOPY WITH ESOPHAGOGASTRODUODENOSCOPY (EGD)  11/2008   Dr. Mavis: Conscious sedation.  Hiatal hernia.  Hemorrhoids. PATIENT REPORTS BEING AWAKE DURING PROCEDURE   COLONOSCOPY WITH PROPOFOL  N/A 10/08/2022   Procedure: COLONOSCOPY WITH PROPOFOL ;  Surgeon: Cindie Carlin POUR, DO;  Location: [facility];  Service: Endoscopy;  Laterality: N/A;  7:30 AM   CRYOTHERAPY     for abnormal pap   HEMORRHOID SURGERY     dr. keven sharps   PARTIAL HYSTERECTOMY     POLYPECTOMY  10/08/2022   Procedure: POLYPECTOMY;  Surgeon: Cindie Carlin POUR, DO;  Location: [facility];  Service: Endoscopy;;   Family History  Problem Relation Age of Onset   Cancer Mother        lung    COPD Father        emphysema   Hypertension Father    Breast cancer Maternal Aunt    Cancer Maternal Aunt        breast   Cancer Paternal Uncle    Colon cancer Neg Hx    Social History   Occupational History   Occupation: homemaker   Tobacco Use   Smoking status: Former    Current packs/day: 0.00    Average  packs/day: 0.3 packs/day for 2.0 years (0.5 ttl pk-yrs)    Types: Cigarettes    Start date: 12/03/2013    Quit date: 12/04/2015    Years since quitting: 9.0   Smokeless tobacco: Never  Vaping Use   Vaping status: Never Used  Substance and Sexual Activity   Alcohol use: No   Drug use: No   Sexual activity: Yes   Tobacco Counseling Counseling given: Yes  SDOH Screenings   Food Insecurity: No Food Insecurity (12/26/2024)  Housing: Low Risk (12/26/2024)  Transportation Needs: No Transportation Needs (12/26/2024)  Utilities: Not At Risk (12/26/2024)  Alcohol Screen: Low Risk (12/26/2023)  Depression (PHQ2-9): Low Risk (12/26/2024)  Recent Concern: Depression (PHQ2-9) - High Risk (10/12/2024)  Financial Resource Strain: Low Risk (12/26/2023)  Physical Activity: Sufficiently Active  (12/26/2024)  Social Connections: Moderately Isolated (12/26/2024)  Stress: No Stress Concern Present (12/26/2024)  Tobacco Use: Medium Risk (12/26/2024)  Health Literacy: Adequate Health Literacy (12/26/2024)   See flowsheets for full screening details  Depression Screen PHQ 2 & 9 Depression Scale- Over the past 2 weeks, how often have you been bothered by any of the following problems? Little interest or pleasure in doing things: 0 Feeling down, depressed, or hopeless (PHQ Adolescent also includes...irritable): 0 PHQ-2 Total Score: 0 Trouble falling or staying asleep, or sleeping too much: 3 Feeling tired or having little energy: 1 Poor appetite or overeating (PHQ Adolescent also includes...weight loss): 2 Feeling bad about yourself - or that you are a failure or have let yourself or your family down: 2 Trouble concentrating on things, such as reading the newspaper or watching television (PHQ Adolescent also includes...like school work): 0 Moving or speaking so slowly that other people could have noticed. Or the opposite - being so fidgety or restless that you have been moving around a lot more than usual: 2 Thoughts that you would be better off dead, or of hurting yourself in some way: 0 PHQ-9 Total Score: 11 If you checked off any problems, how difficult have these problems made it for you to do your work, take care of things at home, or get along with other people?: Not difficult at all     Goals Addressed             This Visit's Progress    Patient Stated   On track    I want to be the best me I can be. I want to exercise more, eat healthier, and be more active.              Objective:    Today's Vitals   12/26/24 0921  Weight: 232 lb (105.2 kg)  Height: 5' 3 (1.6 m)   Body mass index is 41.1 kg/m.  Hearing/Vision screen Hearing Screening - Comments:: Patient denies any hearing difficulties.   Vision Screening - Comments:: Patient is not up to date on yearly  eye exams.  She sees [facility] in [place]. She will call and schedule her yearly exam Immunizations and Health Maintenance Health Maintenance  Topic Date Due   DTaP/Tdap/Td (2 - Tdap) 03/27/2019   Zoster Vaccines- Shingrix (2 of 2) 11/05/2023   OPHTHALMOLOGY EXAM  04/13/2024   COVID-19 Vaccine (2 - 2025-26 season) 07/30/2024   FOOT EXAM  10/23/2024   HEMOGLOBIN A1C  10/27/2024   Medicare Annual Wellness (AWV)  12/25/2024   Diabetic kidney evaluation - Urine ACR  04/11/2025   Mammogram  05/07/2025   Diabetic kidney evaluation - eGFR measurement  10/12/2025   Colonoscopy  10/09/2027   Cervical Cancer Screening (HPV/Pap Cotest)  02/04/2028   Pneumococcal Vaccine: 50+ Years  Completed   Influenza Vaccine  Completed   HPV VACCINES (No Doses Required) Completed   Hepatitis C Screening  Completed   HIV Screening  Completed   Hepatitis B Vaccines 19-59 Average Risk  Aged Out   Meningococcal B Vaccine  Aged Out        Assessment/Plan:  This is a routine wellness examination for Leslie Lyons.  Patient Care Team: Antonetta Rollene BRAVO, MD as PCP - General (Family Medicine) [name], Diannah SQUIBB, MD as PCP - Cardiology (Cardiology) Darroll Anes, DO (Optometry) Cindie Carlin POUR, DO as Consulting Physician (Internal Medicine) Therisa Benton PARAS, NP as Nurse Practitioner (Endocrinology) Ezzard Sonny RAMAN, PA-C as Physician Assistant (Gastroenterology)  I have personally reviewed and noted the following in the patients chart:   Medical and social history Use of alcohol, tobacco or illicit drugs  Current medications and supplements including opioid prescriptions. Functional ability and status Nutritional status Physical activity Advanced directives List of other physicians Hospitalizations, surgeries, and ER visits in previous 12 months Vitals Screenings to include cognitive, depression, and falls Referrals and appointments  No orders of the defined types were placed in this  encounter.  In addition, I have reviewed and discussed with patient certain preventive protocols, quality metrics, and best practice recommendations. A written personalized care plan for preventive services as well as general preventive health recommendations were provided to patient.   Leslie Lyons, CMA   12/26/2024   Return December 27, 2025 at 2:30 pm, for In office Medicare Well Visit w  Wellness Nurse.  After Visit Summary: (MyChart) Due to this being a telephonic visit, the after visit summary with patients personalized plan was offered to patient via MyChart    "

## 2024-12-26 NOTE — Telephone Encounter (Signed)
 Generic is still over priced

## 2024-12-26 NOTE — Telephone Encounter (Signed)
 Pt informed

## 2024-12-27 ENCOUNTER — Telehealth: Payer: Self-pay | Admitting: *Deleted

## 2024-12-27 NOTE — Telephone Encounter (Signed)
 Patient's husband, Arley came by the office and picked up the patient's Tresiba  she received from [employer].

## 2025-01-09 ENCOUNTER — Ambulatory Visit: Admitting: Internal Medicine

## 2025-01-29 ENCOUNTER — Ambulatory Visit: Admitting: Nurse Practitioner

## 2025-02-21 ENCOUNTER — Ambulatory Visit: Admitting: Family Medicine

## 2025-12-27 ENCOUNTER — Ambulatory Visit: Payer: Self-pay
